# Patient Record
Sex: Male | Born: 1940 | Race: White | Hispanic: No | Marital: Married | Smoking: Former smoker
Health system: Southern US, Community
[De-identification: ages and names within clinical notes are randomized; demographics above are authoritative.]

## PROBLEM LIST (undated history)

## (undated) DIAGNOSIS — D649 Anemia, unspecified: Secondary | ICD-10-CM

## (undated) DIAGNOSIS — K219 Gastro-esophageal reflux disease without esophagitis: Secondary | ICD-10-CM

## (undated) DIAGNOSIS — H3530 Unspecified macular degeneration: Secondary | ICD-10-CM

## (undated) DIAGNOSIS — E785 Hyperlipidemia, unspecified: Secondary | ICD-10-CM

## (undated) DIAGNOSIS — K5090 Crohn's disease, unspecified, without complications: Secondary | ICD-10-CM

## (undated) DIAGNOSIS — I480 Paroxysmal atrial fibrillation: Secondary | ICD-10-CM

## (undated) DIAGNOSIS — K922 Gastrointestinal hemorrhage, unspecified: Secondary | ICD-10-CM

## (undated) DIAGNOSIS — C4492 Squamous cell carcinoma of skin, unspecified: Secondary | ICD-10-CM

## (undated) DIAGNOSIS — R011 Cardiac murmur, unspecified: Secondary | ICD-10-CM

## (undated) DIAGNOSIS — R002 Palpitations: Secondary | ICD-10-CM

## (undated) DIAGNOSIS — I491 Atrial premature depolarization: Secondary | ICD-10-CM

## (undated) DIAGNOSIS — I350 Nonrheumatic aortic (valve) stenosis: Secondary | ICD-10-CM

## (undated) HISTORY — DX: Atrial premature depolarization: I49.1

## (undated) HISTORY — PX: TONSILLECTOMY: SUR1361

## (undated) HISTORY — DX: Crohn's disease, unspecified, without complications: K50.90

## (undated) HISTORY — PX: SKIN CANCER EXCISION: SHX779

## (undated) HISTORY — DX: Hyperlipidemia, unspecified: E78.5

## (undated) HISTORY — PX: APPENDECTOMY: SHX54

## (undated) HISTORY — DX: Gastrointestinal hemorrhage, unspecified: K92.2

## (undated) HISTORY — DX: Paroxysmal atrial fibrillation: I48.0

## (undated) HISTORY — DX: Palpitations: R00.2

## (undated) HISTORY — DX: Nonrheumatic aortic (valve) stenosis: I35.0

## (undated) HISTORY — DX: Unspecified macular degeneration: H35.30

## (undated) HISTORY — PX: CATARACT EXTRACTION W/ INTRAOCULAR LENS  IMPLANT, BILATERAL: SHX1307

---
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
## 1962-11-10 DIAGNOSIS — K922 Gastrointestinal hemorrhage, unspecified: Secondary | ICD-10-CM

## 1962-11-10 HISTORY — PX: BLADDER REPAIR: SHX76

## 1962-11-10 HISTORY — DX: Gastrointestinal hemorrhage, unspecified: K92.2

## 2003-11-29 HISTORY — PX: CARDIOVASCULAR STRESS TEST: SHX262

## 2005-10-21 ENCOUNTER — Encounter: Admission: RE | Admit: 2005-10-21 | Discharge: 2005-10-21 | Payer: Self-pay | Admitting: Internal Medicine

## 2005-10-23 HISTORY — PX: US ECHOCARDIOGRAPHY: HXRAD669

## 2010-01-29 DIAGNOSIS — R079 Chest pain, unspecified: Secondary | ICD-10-CM | POA: Insufficient documentation

## 2010-01-29 DIAGNOSIS — M79672 Pain in left foot: Secondary | ICD-10-CM | POA: Insufficient documentation

## 2010-01-29 DIAGNOSIS — I480 Paroxysmal atrial fibrillation: Secondary | ICD-10-CM | POA: Insufficient documentation

## 2010-01-29 DIAGNOSIS — N3090 Cystitis, unspecified without hematuria: Secondary | ICD-10-CM | POA: Insufficient documentation

## 2010-01-29 DIAGNOSIS — E785 Hyperlipidemia, unspecified: Secondary | ICD-10-CM | POA: Insufficient documentation

## 2010-05-20 HISTORY — PX: US ECHOCARDIOGRAPHY: HXRAD669

## 2010-11-25 ENCOUNTER — Ambulatory Visit: Payer: Self-pay | Admitting: Cardiology

## 2011-05-15 ENCOUNTER — Telehealth: Payer: Self-pay | Admitting: Cardiology

## 2011-05-15 NOTE — Telephone Encounter (Signed)
Scheduled appointment for patient.

## 2011-05-15 NOTE — Telephone Encounter (Signed)
PATIENT NEEDS APPT

## 2011-06-13 ENCOUNTER — Encounter: Payer: Self-pay | Admitting: Cardiology

## 2011-06-18 ENCOUNTER — Encounter: Payer: Self-pay | Admitting: Cardiology

## 2011-06-18 ENCOUNTER — Ambulatory Visit (INDEPENDENT_AMBULATORY_CARE_PROVIDER_SITE_OTHER): Payer: Medicare Other | Admitting: Cardiology

## 2011-06-18 DIAGNOSIS — K5090 Crohn's disease, unspecified, without complications: Secondary | ICD-10-CM | POA: Insufficient documentation

## 2011-06-18 DIAGNOSIS — I350 Nonrheumatic aortic (valve) stenosis: Secondary | ICD-10-CM

## 2011-06-18 DIAGNOSIS — I359 Nonrheumatic aortic valve disorder, unspecified: Secondary | ICD-10-CM

## 2011-06-18 DIAGNOSIS — I480 Paroxysmal atrial fibrillation: Secondary | ICD-10-CM | POA: Insufficient documentation

## 2011-06-18 DIAGNOSIS — I4891 Unspecified atrial fibrillation: Secondary | ICD-10-CM

## 2011-06-18 NOTE — Progress Notes (Signed)
Nicholas [name] Date of Birth:  Sep 05, 1941 [facility] [address]   [address] [place], [place]  [address] [phone]           Fax   [phone]  HPI: Present 70 year old gentleman is seen for a six-month followup office visit he has a history of paroxysmal atrial fibrillation and history of mild aortic stenosis.  He had a normal stress Cardiolite in 2005.  His last echocardiogram was 05/20/10 and showed that his aortic stenosis is still mild with a peak gradient of 13 and a mean gradient of 6.7.  Since last visit the patient has been feeling well with no new cardiac symptoms.  Current Outpatient Prescriptions  Medication Sig Dispense Refill  . aspirin 81 MG EC tablet Take 81 mg by mouth daily.        . digoxin (LANOXIN) 0.25 MG tablet Take 250 mcg by mouth daily.        . fenofibrate micronized (LOFIBRA) 200 MG capsule Take 200 mg by mouth daily before breakfast.        . ferrous sulfate 325 (65 FE) MG tablet Take 1,300 mg by mouth daily with breakfast.        . metoprolol tartrate (LOPRESSOR) 25 MG tablet Take 25 mg by mouth 2 (two) times daily.        . Multiple Vitamins-Minerals (ICAPS PO) Take by mouth daily.          No Known Allergies  There is no problem list on file for this patient.   History  Smoking status  . Former Smoker  . Quit date: 11/11/1963  Smokeless tobacco  . Not on file    History  Alcohol Use No    No family history on file.  Review of Systems: The patient denies any heat or cold intolerance.  No weight gain or weight loss.  The patient denies headaches or blurry vision.  There is no cough or sputum production.  The patient denies dizziness.  There is no hematuria or hematochezia.  The patient denies any muscle aches or arthritis.  The patient denies any rash.  The patient denies frequent falling or instability.  There is no history of depression or anxiety.  All other systems were reviewed and are  negative.   Physical Exam: Filed Vitals:   06/18/11 1119  BP: 120/60  Pulse: 64  The general appearance reveals a well-developed well-nourished gentleman in no distress.Pupils equal and reactive.   Extraocular Movements are full.  There is no scleral icterus.  The mouth and pharynx are normal.  The neck is supple.  The carotids reveal no bruits.  The jugular venous pressure is normal.  The thyroid is not enlarged.  There is no lymphadenopathy.  The chest is clear to percussion and auscultation. There are no rales or rhonchi. Expansion of the chest is symmetrical.  The precordium is quiet.  The first heart sound is normal.  The second heart sound is physiologically split.  There is no  gallop rub or click.  There is no abnormal lift or heave.There is a soft systolic ejection murmur at the base.The abdomen is soft and nontender. Bowel sounds are normal. The liver and spleen are not enlarged. There Are no abdominal masses. There are no bruits.    Normal extremity without phlebitis or edema.Strength is normal and symmetrical in all extremities.  There is no lateralizing weakness.  There are no sensory deficits.  The skin is warm and dry.  There is no rash.He does have very dry skin on his lower extremities        Assessment / Plan: Continue same medication recheck in 6 months for followup office visit and EKG

## 2011-07-12 ENCOUNTER — Ambulatory Visit (INDEPENDENT_AMBULATORY_CARE_PROVIDER_SITE_OTHER): Payer: Medicare Other

## 2011-07-12 ENCOUNTER — Inpatient Hospital Stay (INDEPENDENT_AMBULATORY_CARE_PROVIDER_SITE_OTHER)
Admission: RE | Admit: 2011-07-12 | Discharge: 2011-07-12 | Disposition: A | Discharge status: Home / Self Care | Payer: Medicare Other | Source: Ambulatory Visit | Attending: Emergency Medicine | Admitting: Emergency Medicine

## 2011-07-12 DIAGNOSIS — S5000XA Contusion of unspecified elbow, initial encounter: Secondary | ICD-10-CM

## 2011-08-22 DIAGNOSIS — E039 Hypothyroidism, unspecified: Secondary | ICD-10-CM | POA: Insufficient documentation

## 2011-12-03 DIAGNOSIS — K921 Melena: Secondary | ICD-10-CM | POA: Insufficient documentation

## 2011-12-04 ENCOUNTER — Encounter: Payer: Self-pay | Admitting: Gastroenterology

## 2011-12-15 ENCOUNTER — Ambulatory Visit (INDEPENDENT_AMBULATORY_CARE_PROVIDER_SITE_OTHER): Payer: Medicare Other | Admitting: Cardiology

## 2011-12-15 ENCOUNTER — Encounter: Payer: Self-pay | Admitting: Cardiology

## 2011-12-15 VITALS — BP 110/68 | HR 45 | Wt 152.0 lb

## 2011-12-15 DIAGNOSIS — I359 Nonrheumatic aortic valve disorder, unspecified: Secondary | ICD-10-CM

## 2011-12-15 DIAGNOSIS — I350 Nonrheumatic aortic (valve) stenosis: Secondary | ICD-10-CM

## 2011-12-15 DIAGNOSIS — I480 Paroxysmal atrial fibrillation: Secondary | ICD-10-CM

## 2011-12-15 DIAGNOSIS — K5090 Crohn's disease, unspecified, without complications: Secondary | ICD-10-CM

## 2011-12-15 DIAGNOSIS — I4891 Unspecified atrial fibrillation: Secondary | ICD-10-CM

## 2011-12-15 MED ORDER — DIGOXIN 250 MCG PO TABS: 250.0000 ug | ORAL_TABLET | Freq: Every day | ORAL | Status: DC

## 2011-12-15 NOTE — Patient Instructions (Signed)
Your physician recommends that you continue on your current medications as directed. Please refer to the Current Medication list given to you today.  Your physician wants you to follow-up in: 6 months. You will receive a reminder letter in the mail two months in advance. If you don't receive a letter, please call our office to schedule the follow-up appointment.  

## 2011-12-15 NOTE — Assessment & Plan Note (Signed)
The patient has not had any episodes of recurrent atrial fibrillation.  Is much cardiogram today shows sinus bradycardia and no ectopic beats.

## 2011-12-15 NOTE — Progress Notes (Signed)
Nicholas [name] Date of Birth:  Feb 16, 1941 [facility] [address] [place], [place]  [address] [phone]         Fax   [phone]  History of Present Illness: This pleasant 71 year old gentleman is seen for a six-month followup office visit.  He has a past history of paroxysmal atrial fibrillation.  He is a past history of mild aortic stenosis.  He has a history of Crohn's disease since he was a teenager.  Since last visit the patient has been done well from the standpoint of his heart.  He has had a lot of difficulty with his abdomen.  Current Outpatient Prescriptions  Medication Sig Dispense Refill  . digoxin (LANOXIN) 0.25 MG tablet Take 1 tablet (250 mcg total) by mouth daily.  90 tablet  3  . fenofibrate micronized (LOFIBRA) 200 MG capsule Take 200 mg by mouth daily before breakfast.        . ferrous sulfate 325 (65 FE) MG tablet 2 TABS PO QD      . metoprolol (LOPRESSOR) 50 MG tablet 1/2 TAB PO BID      . multivitamin-lutein (OCUVITE-LUTEIN) CAPS Take 1 capsule by mouth daily.      [name] aspirin 81 MG EC tablet Take 81 mg by mouth daily.          No Known Allergies  Patient Active Problem List  Diagnoses  . Aortic stenosis, mild  . Paroxysmal atrial fibrillation  . Crohn's disease    History  Smoking status  . Former Smoker  . Quit date: 11/11/1963  Smokeless tobacco  . Not on file    History  Alcohol Use No    No family history on file.  Review of Systems: Constitutional: no fever chills diaphoresis or fatigue or change in weight.  Head and neck: no hearing loss, no epistaxis, no photophobia or visual disturbance. Respiratory: No cough, shortness of breath or wheezing. Cardiovascular: No chest pain peripheral edema, palpitations. Gastrointestinal: Positive for Crohn's disease Genitourinary: No dysuria, no frequency, no urgency, no nocturia. Musculoskeletal:No arthralgias, no back pain, no gait disturbance or myalgias. Neurological:  No dizziness, no headaches, no numbness, no seizures, no syncope, no weakness, no tremors. Hematologic: No lymphadenopathy, no easy bruising. Psychiatric: No confusion, no hallucinations, no sleep disturbance.    Physical Exam: Filed Vitals:   12/15/11 1025  BP: 110/68  Pulse: 45   the general appearance reveals a slightly pale middle-aged gentleman in no acute distress.Pupils equal and reactive.   Extraocular Movements are full.  There is no scleral icterus.  The mouth and pharynx are normal.  The neck is supple.  The carotids reveal no bruits.  The jugular venous pressure is normal.  The thyroid is not enlarged.  There is no lymphadenopathy.  The chest is clear to percussion and auscultation. There are no rales or rhonchi. Expansion of the chest is symmetrical.  Heart reveals a soft systolic ejection murmur at the base.  No diastolic murmur.  No gallop or rub.The abdomen is soft and nontender. Bowel sounds are normal. The liver and spleen are not enlarged. There Are no abdominal masses. There are no bruits.  The pedal pulses are good.  There is no phlebitis or edema.  There is no cyanosis or clubbing. Strength is normal and symmetrical in all extremities.  There is no lateralizing weakness.  There are no sensory deficits.  EKG shows sinus bradycardia and no ischemic changes   Assessment / Plan: Continue on same medication.  Recheck in 6 months.

## 2011-12-15 NOTE — Assessment & Plan Note (Signed)
Since his last visit here the patient has had difficulty with complications from his Crohn's disease.  He started feeling bad about Thanksgiving 2012.  In January he started running a fever and went to the urgent care where he was given antibiotics which helped.  His hemoglobin at that time which lasted for 24 hours and to followup a hemoglobin dropped to 10.6.  The patient has an appointment to see Dr. Russella Dar next week.  He states that because of previous extensive scarring from his Crohn's disease he is unable to have a colonoscopy.  He has had Crohn's disease for more than 40 years.  He had bright hematochezia for 24 hour hours and for that reason held his aspirin for 10 days.

## 2011-12-15 NOTE — Assessment & Plan Note (Signed)
The patient denies any chest pain or shortness of breath or exertional dizziness or syncope related to his aortic valve disease.

## 2011-12-18 ENCOUNTER — Encounter: Payer: Self-pay | Admitting: Gastroenterology

## 2011-12-18 ENCOUNTER — Ambulatory Visit (INDEPENDENT_AMBULATORY_CARE_PROVIDER_SITE_OTHER): Payer: Medicare Other | Admitting: Gastroenterology

## 2011-12-18 VITALS — BP 120/60 | HR 58 | Ht 69.0 in | Wt 153.2 lb

## 2011-12-18 DIAGNOSIS — D649 Anemia, unspecified: Secondary | ICD-10-CM

## 2011-12-18 DIAGNOSIS — K921 Melena: Secondary | ICD-10-CM

## 2011-12-18 DIAGNOSIS — K5090 Crohn's disease, unspecified, without complications: Secondary | ICD-10-CM

## 2011-12-18 NOTE — Progress Notes (Signed)
History of Present Illness: This is a 71 year old male here today with his wife. He relates a history of bloody bowel movements occurring 3-4 occasions over the course of 24 hours on January 22. He was evaluated by Dr. Eloise Harman who found no abnormalities on rectal examination. His hemoglobin was 10.4 on 1/22 and it has increased to 12.8 on 2/1. He has a very complicated past history and unfortunately I do not have any records available. 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 He was then diagnosed with Crohn's disease. He had a hospitalization for major GI hemorrhage in the early 1960s that he feels was related to his Crohn's disease. Has not been on chronic medication or had any GI followup for years. He saw Dr. Eugene Garnet in the 1980s and apparently he had 2 attempts at colonoscopies but his bowel was not prepped and it appears he may have had substantial rearrangement of his colonic anatomy. He states he hasn't lost his appetite for about one month which has now returned and he lost 10 pounds. He generally has 3 loose stools per day and has had this bowel pattern for many years. Denies abdominal pain, constipation, change in stool caliber, melena, nausea, vomiting, dysphagia, reflux symptoms, chest pain.   No Known Allergies Outpatient Prescriptions Prior to Visit  Medication Sig Dispense Refill  . aspirin 81 MG EC tablet Take 81 mg by mouth daily.        . digoxin (LANOXIN) 0.25 MG tablet Take 1 tablet (250 mcg total) by mouth daily.  90 tablet  3  . fenofibrate micronized (LOFIBRA) 200 MG capsule Take 200 mg by mouth daily before breakfast.        . ferrous sulfate 325 (65 FE) MG tablet 2 TABS PO QD      . metoprolol (LOPRESSOR) 50 MG tablet 1/2 TAB PO BID      . multivitamin-lutein  (OCUVITE-LUTEIN) CAPS Take 1 capsule by mouth daily.       Past Medical History  Diagnosis Date  . Hyperlipidemia   . PAF (paroxysmal atrial fibrillation)   . Aortic stenosis, mild   . Crohn's disease   . PAC (premature atrial contraction)   . Palpitations   . Macular degeneration   . Cataract   . Colonic hemorrhage 1964   Past Surgical History  Procedure Date  . Tonsillectomy   . Stomach surgery 1959    ulcer  . US echocardiography 05/20/2010    EF 55-60%  . US echocardiography 10/23/2005    EF 55-60%  . Cardiovascular stress test 11/29/2003    EF 64%  . Appendectomy   . Bladder repair 1964    intestine leaks/bladder repair   History   Social History  . Marital Status: Married    Spouse Name: N/A    Number of Children: 3  . Years of Education: N/A   Occupational History  . Retired    Social History Main Topics  . Smoking status: Former Smoker    Quit date: 11/11/1963  . Smokeless tobacco: Never Used  . Alcohol Use: No  . Drug Use: No  . Sexually Active: None   Other Topics Concern  . None   Social History Narrative   caffeinated drinks less than one a day  Family History  Problem Relation Age of Onset  . Heart disease Maternal Grandfather   . Heart disease      maternal side  . Colon cancer Neg Hx     Review of Systems: Pertinent positive and negative review of systems were noted in the above HPI section. All other review of systems were otherwise negative.  Physical Exam: General: Well developed , well nourished, no acute distress Head: Normocephalic and atraumatic Eyes:  sclerae anicteric, EOMI Ears: Normal auditory acuity Mouth: No deformity or lesions Neck: Supple, no masses or thyromegaly Lungs: Clear throughout to auscultation Heart: Regular rate and rhythm; no murmurs, rubs or bruits Abdomen: Well-healed upper abdominal incision just off the midline and a well-healed lower abdominal midline incision. His left abdomen has a doughy  texture. Soft, non tender and non distended. No masses, hepatosplenomegaly or hernias noted. Normal Bowel sounds Rectal: Deferred with recent exam by Dr. Eloise Harman showing no lesions and no blood Musculoskeletal: Symmetrical with no gross deformities  Skin: No lesions on visible extremities Pulses:  Normal pulses noted Extremities: No clubbing, cyanosis, edema or deformities noted Neurological: Alert oriented x 4, grossly nonfocal Cervical Nodes:  No significant cervical adenopathy Inguinal Nodes: No significant inguinal adenopathy Psychological:  Alert and cooperative. Normal mood and affect  Assessment and Recommendations:  1. Self-limited hematochezia. Possible diverticular bleed or bleed secondary to Crohn's disease. Rule out colorectal neoplasms, AVMs and other possibilities. Given the 2 prior failed attempts at colonoscopy I would like to try to obtain records from Dr. Reece Packer before considering another attempt at colonoscopy. Schedule CT scan of the abdomen and pelvis.  2. History of Crohn's disease with a prior colovesical fistula surgical management. Given the patient's description he may have a nonfunctional or bypass segment of colon. CT scan as above.  3. 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

## 2011-12-18 NOTE — Patient Instructions (Signed)
  You have been scheduled for a CT scan of the abdomen and pelvis at [facility] ([address]---this is in the same building as [facility]).   You are scheduled on 12/22/11 at 10:30am. You should arrive 15 minutes prior to your appointment time for registration. Please follow the written instructions below on the day of your exam:  WARNING: IF YOU ARE ALLERGIC TO IODINE/X-RAY DYE, PLEASE NOTIFY RADIOLOGY IMMEDIATELY AT [phone]! YOU WILL BE GIVEN A 13 HOUR PREMEDICATION PREP.  1) Do not eat or drink anything after 6:30am (4 hours prior to your test) 2) You have been given 2 bottles of oral contrast to drink. The solution may taste better if refrigerated, but do NOT add ice or any other liquid to this solution. Shake well before drinking.    Drink 1 bottle of contrast @ 8:30am (2 hours prior to your exam)  Drink 1 bottle of contrast @ 9:30am (1 hour prior to your exam)  You may take any medications as prescribed with a small amount of water except for the following: Metformin, Glucophage, Glucovance, Avandamet, Riomet, Fortamet, Actoplus Met, Janumet, Glumetza or Metaglip. The above medications must be held the day of the exam AND 48 hours after the exam.  The purpose of you drinking the oral contrast is to aid in the visualization of your intestinal tract. The contrast solution may cause some diarrhea. Before your exam is started, you will be given a small amount of fluid to drink. Depending on your individual set of symptoms, you may also receive an intravenous injection of x-ray contrast/dye. Plan on being at [facility] for 30 minutes or long, depending on the type of exam you are having performed.  If you have any questions regarding your exam or if you need to reschedule, you may call the CT department at [phone] between the hours of 8:00 am and 5:00 pm, Monday-Friday.  ________________________________________________________________________  We will  attempt to obtain all of your GI records from Dr. Barnett Abu. cc: Jarome Matin, MD

## 2011-12-22 ENCOUNTER — Ambulatory Visit (INDEPENDENT_AMBULATORY_CARE_PROVIDER_SITE_OTHER)
Admission: RE | Admit: 2011-12-22 | Discharge: 2011-12-22 | Disposition: A | Discharge status: Home / Self Care | Payer: Medicare Other | Source: Ambulatory Visit | Attending: Gastroenterology | Admitting: Gastroenterology

## 2011-12-22 ENCOUNTER — Other Ambulatory Visit: Payer: Self-pay

## 2011-12-22 DIAGNOSIS — K921 Melena: Secondary | ICD-10-CM

## 2011-12-22 DIAGNOSIS — D649 Anemia, unspecified: Secondary | ICD-10-CM

## 2011-12-22 MED ORDER — BUDESONIDE 3 MG PO CP24: 9.0000 mg | ORAL_CAPSULE | Freq: Every day | ORAL | Status: DC

## 2011-12-22 MED ORDER — IOHEXOL 300 MG/ML  SOLN
100.0000 mL | Freq: Once | INTRAMUSCULAR | Status: AC | PRN
  Administered 2011-12-22: 100 mL via INTRAVENOUS

## 2011-12-25 ENCOUNTER — Telehealth: Payer: Self-pay | Admitting: Gastroenterology

## 2011-12-25 MED ORDER — BUDESONIDE 3 MG PO CP24: 9.0000 mg | ORAL_CAPSULE | Freq: Every day | ORAL | Status: DC

## 2011-12-25 NOTE — Telephone Encounter (Signed)
Resent the budesonide the [facility] pharmacy and notified the patient. Spelled the medication name to the patient per his request.

## 2012-01-06 ENCOUNTER — Telehealth: Payer: Self-pay | Admitting: Gastroenterology

## 2012-01-06 NOTE — Telephone Encounter (Signed)
9 pages received.

## 2012-01-13 DIAGNOSIS — K50813 Crohn's disease of both small and large intestine with fistula: Secondary | ICD-10-CM | POA: Insufficient documentation

## 2012-01-30 ENCOUNTER — Telehealth: Payer: Self-pay | Admitting: Gastroenterology

## 2012-01-30 NOTE — Telephone Encounter (Signed)
No other advice on meds. He should call Dr. Steele Sizer to check on his advice regarding budesonide.

## 2012-01-30 NOTE — Telephone Encounter (Signed)
Notified patient of Dr. Ardell Isaacs recommendations to contact Dr. Murrell Redden office. Pt agreed and verbalized understanding.

## 2012-01-30 NOTE — Telephone Encounter (Signed)
Patient states he will be in the donut hole in mid April and cannot afford to pay out of pocket for budesonide. Patient states that when he went and saw Dr. Chesley Mires at [facility], he told him that he may be able to decrease his budesonide sometime soon. I told patient that I have read the note from Dr. Chesley Mires and it does not mention changing his medication at this time but to monitor him and schedule f/u visit in 4 months. Pt states that Dr. Chesley Mires mentioned it to him but did not say for sure that he could decrease his medicine now. Told him that I know that he may not be able to afford the budesonide but the only way to get out of a donut hole unfortunately is to pay your way out. Pt states he will not be able to financially do that when the times comes. Told him I can get samples for him to take at that time but not enough to sustain him long term. Pt states he will take the samples but wants to know if there is a alternative medicine or if he can decrease medicine. I told him that all the other alternative medicines will be just as expensive when he is in the donut hole but I will ask Dr. Russella Dar about decreasing budesonide. Samples left out front for patient to pick up at his convenience. Please advise.

## 2012-04-09 ENCOUNTER — Emergency Department (HOSPITAL_COMMUNITY): Payer: Medicare Other

## 2012-04-09 ENCOUNTER — Encounter: Payer: Self-pay | Admitting: Gastroenterology

## 2012-04-09 ENCOUNTER — Encounter (HOSPITAL_COMMUNITY): Payer: Self-pay | Admitting: Emergency Medicine

## 2012-04-09 ENCOUNTER — Inpatient Hospital Stay (HOSPITAL_COMMUNITY)
Admission: EM | Admit: 2012-04-09 | Discharge: 2012-04-26 | DRG: 330 | Disposition: A | Discharge status: Home / Self Care | Payer: Medicare Other | Attending: Surgery | Admitting: Surgery

## 2012-04-09 DIAGNOSIS — K632 Fistula of intestine: Secondary | ICD-10-CM | POA: Diagnosis present

## 2012-04-09 DIAGNOSIS — K56609 Unspecified intestinal obstruction, unspecified as to partial versus complete obstruction: Secondary | ICD-10-CM | POA: Diagnosis present

## 2012-04-09 DIAGNOSIS — D62 Acute posthemorrhagic anemia: Secondary | ICD-10-CM | POA: Diagnosis present

## 2012-04-09 DIAGNOSIS — R109 Unspecified abdominal pain: Secondary | ICD-10-CM

## 2012-04-09 DIAGNOSIS — L02219 Cutaneous abscess of trunk, unspecified: Secondary | ICD-10-CM | POA: Diagnosis present

## 2012-04-09 DIAGNOSIS — L03319 Cellulitis of trunk, unspecified: Secondary | ICD-10-CM | POA: Diagnosis present

## 2012-04-09 DIAGNOSIS — R933 Abnormal findings on diagnostic imaging of other parts of digestive tract: Secondary | ICD-10-CM

## 2012-04-09 DIAGNOSIS — Z87891 Personal history of nicotine dependence: Secondary | ICD-10-CM

## 2012-04-09 DIAGNOSIS — K5090 Crohn's disease, unspecified, without complications: Principal | ICD-10-CM | POA: Diagnosis present

## 2012-04-09 DIAGNOSIS — H3530 Unspecified macular degeneration: Secondary | ICD-10-CM | POA: Diagnosis present

## 2012-04-09 DIAGNOSIS — E46 Unspecified protein-calorie malnutrition: Secondary | ICD-10-CM | POA: Diagnosis present

## 2012-04-09 DIAGNOSIS — R1031 Right lower quadrant pain: Secondary | ICD-10-CM

## 2012-04-09 DIAGNOSIS — I4891 Unspecified atrial fibrillation: Secondary | ICD-10-CM | POA: Diagnosis present

## 2012-04-09 DIAGNOSIS — E785 Hyperlipidemia, unspecified: Secondary | ICD-10-CM | POA: Diagnosis present

## 2012-04-09 LAB — CBC
HCT: 34.5 % — ABNORMAL LOW (ref 39.0–52.0)
MCHC: 33 g/dL (ref 30.0–36.0)
Platelets: 303 10*3/uL (ref 150–400)
RDW: 16.2 % — ABNORMAL HIGH (ref 11.5–15.5)
WBC: 10.4 10*3/uL (ref 4.0–10.5)

## 2012-04-09 LAB — COMPREHENSIVE METABOLIC PANEL
ALT: 7 U/L (ref 0–53)
Albumin: 3 g/dL — ABNORMAL LOW (ref 3.5–5.2)
Alkaline Phosphatase: 49 U/L (ref 39–117)
Calcium: 9 mg/dL (ref 8.4–10.5)
GFR calc Af Amer: >90 mL/min (ref >90)
Glucose, Bld: 128 mg/dL — ABNORMAL HIGH (ref 70–99)
Potassium: 3.8 mEq/L (ref 3.5–5.1)
Sodium: 134 mEq/L — ABNORMAL LOW (ref 135–145)
Total Protein: 6.6 g/dL (ref 6.0–8.3)

## 2012-04-09 LAB — DIFFERENTIAL
Basophils Absolute: 0 10*3/uL (ref 0.0–0.1)
Basophils Relative: 0 % (ref 0–1)
Lymphocytes Relative: 9 % — ABNORMAL LOW (ref 12–46)
Monocytes Absolute: 0.8 10*3/uL (ref 0.1–1.0)
Neutro Abs: 8.6 10*3/uL — ABNORMAL HIGH (ref 1.7–7.7)
Neutrophils Relative %: 83 % — ABNORMAL HIGH (ref 43–77)

## 2012-04-09 LAB — SEDIMENTATION RATE: Sed Rate: 36 mm/hr — ABNORMAL HIGH (ref 0–16)

## 2012-04-09 MED ORDER — KCL IN DEXTROSE-NACL 10-5-0.45 MEQ/L-%-% IV SOLN
INTRAVENOUS | Status: DC
  Administered 2012-04-10 (×3): via INTRAVENOUS
  Filled 2012-04-09 (×5): qty 1000

## 2012-04-09 MED ORDER — ONDANSETRON HCL 4 MG/2ML IJ SOLN: 4.0000 mg | Freq: Four times a day (QID) | INTRAMUSCULAR | Status: DC | PRN

## 2012-04-09 MED ORDER — HYDROMORPHONE HCL PF 1 MG/ML IJ SOLN: 1.0000 mg | INTRAMUSCULAR | Status: DC | PRN

## 2012-04-09 MED ORDER — PANTOPRAZOLE SODIUM 40 MG IV SOLR
40.0000 mg | Freq: Every day | INTRAVENOUS | Status: DC
  Administered 2012-04-10 – 2012-04-25 (×16): 40 mg via INTRAVENOUS
  Filled 2012-04-09 (×19): qty 40

## 2012-04-09 MED ORDER — ONDANSETRON HCL 4 MG/2ML IJ SOLN
4.0000 mg | Freq: Once | INTRAMUSCULAR | Status: AC
  Administered 2012-04-09: 4 mg via INTRAVENOUS
  Filled 2012-04-09: qty 2

## 2012-04-09 MED ORDER — HYDROMORPHONE HCL PF 1 MG/ML IJ SOLN
0.5000 mg | INTRAMUSCULAR | Status: DC | PRN
  Administered 2012-04-09: 0.5 mg via INTRAVENOUS
  Filled 2012-04-09: qty 1

## 2012-04-09 MED ORDER — DIGOXIN 0.25 MG/ML IJ SOLN
0.1250 mg | Freq: Every day | INTRAMUSCULAR | Status: DC
  Administered 2012-04-11 – 2012-04-26 (×15): 0.125 mg via INTRAVENOUS
  Filled 2012-04-09 (×18): qty 0.5

## 2012-04-09 MED ORDER — IOHEXOL 300 MG/ML  SOLN
20.0000 mL | INTRAMUSCULAR | Status: AC
  Administered 2012-04-09 (×2): 20 mL via ORAL

## 2012-04-09 MED ORDER — PIPERACILLIN-TAZOBACTAM 3.375 G IVPB
3.3750 g | Freq: Three times a day (TID) | INTRAVENOUS | Status: DC
  Administered 2012-04-10 – 2012-04-24 (×42): 3.375 g via INTRAVENOUS
  Filled 2012-04-09 (×52): qty 50

## 2012-04-09 MED ORDER — ENOXAPARIN SODIUM 40 MG/0.4ML ~~LOC~~ SOLN
40.0000 mg | SUBCUTANEOUS | Status: DC
  Administered 2012-04-10 – 2012-04-15 (×6): 40 mg via SUBCUTANEOUS
  Filled 2012-04-09 (×7): qty 0.4

## 2012-04-09 MED ORDER — METOPROLOL TARTRATE 1 MG/ML IV SOLN
10.0000 mg | Freq: Four times a day (QID) | INTRAVENOUS | Status: DC
  Administered 2012-04-12: 10 mg via INTRAVENOUS
  Filled 2012-04-09 (×18): qty 10

## 2012-04-09 MED ORDER — HYDROMORPHONE HCL PF 1 MG/ML IJ SOLN
0.5000 mg | Freq: Once | INTRAMUSCULAR | Status: AC
  Administered 2012-04-09: 0.5 mg via INTRAVENOUS
  Filled 2012-04-09: qty 1

## 2012-04-09 MED ORDER — PIPERACILLIN-TAZOBACTAM 3.375 G IVPB 30 MIN
3.3750 g | Freq: Once | INTRAVENOUS | Status: AC
  Administered 2012-04-09: 3.375 g via INTRAVENOUS
  Filled 2012-04-09: qty 50

## 2012-04-09 MED ORDER — IOHEXOL 300 MG/ML  SOLN
100.0000 mL | Freq: Once | INTRAMUSCULAR | Status: AC | PRN
  Administered 2012-04-09: 100 mL via INTRAVENOUS

## 2012-04-09 MED ORDER — SODIUM CHLORIDE 0.9 % IV BOLUS (SEPSIS)
1000.0000 mL | Freq: Once | INTRAVENOUS | Status: AC
  Administered 2012-04-09: 1000 mL via INTRAVENOUS

## 2012-04-09 NOTE — H&P (Signed)
Nicholas [name] is an 71 y.o. male.   Chief Complaint: Abdominal pain history of Crohn's disease HPI: Pt has a 5 month history of intermittent abdominal pain.  Location is RLQ.  Comes and goes.  Persistent and more severe today.  No nausea or vomiting.  No BM today.  History of blood in his stool.  Is followed at [facility] for his Crohn's.  Pain now constant.  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 He has had minimal medical treatment of his Crohns and no problem until November 2012 when he had hematochezia and was seen by [place].  Past Medical History  Diagnosis Date  . Hyperlipidemia   . PAF (paroxysmal atrial fibrillation)   . Aortic stenosis, mild   . Crohn's disease   . PAC (premature atrial contraction)   . Palpitations   . Macular degeneration   . Cataract   . Colonic hemorrhage 1964    Past Surgical History  Procedure Date  . Tonsillectomy   . Stomach surgery 1959    ulcer  . US echocardiography 05/20/2010    EF 55-60%  . US echocardiography 10/23/2005    EF 55-60%  . Cardiovascular stress test 11/29/2003    EF 64%  . Appendectomy   . Bladder repair 1964    intestine leaks/bladder repair    Family History  Problem Relation Age of Onset  . Heart disease Maternal Grandfather   . Heart disease      maternal side  . Colon cancer Neg Hx    Social History:  reports that he quit smoking about 48 years ago. He has never used smokeless tobacco. He reports that he does not drink alcohol or use illicit drugs.  Allergies: No Known Allergies   (Not in a hospital admission)  Results for orders placed during the hospital encounter of 04/09/12 (from the past 48 hour(s))  CBC     Status: Abnormal   Collection Time   04/09/12  5:58 PM      Component Value Range Comment   WBC 10.4  4.0 - 10.5 (K/uL)    RBC 4.34  4.22 - 5.81  (MIL/uL)    Hemoglobin 11.4 (*) 13.0 - 17.0 (g/dL)    HCT 40.3 (*) 47.4 - 52.0 (%)    MCV 79.5  78.0 - 100.0 (fL)    MCH 26.3  26.0 - 34.0 (pg)    MCHC 33.0  30.0 - 36.0 (g/dL)    RDW 25.9 (*) 56.3 - 15.5 (%)    Platelets 303  150 - 400 (K/uL)   DIFFERENTIAL     Status: Abnormal   Collection Time   04/09/12  5:58 PM      Component Value Range Comment   Neutrophils Relative 83 (*) 43 - 77 (%)    Neutro Abs 8.6 (*) 1.7 - 7.7 (K/uL)    Lymphocytes Relative 9 (*) 12 - 46 (%)    Lymphs Abs 0.9  0.7 - 4.0 (K/uL)    Monocytes Relative 8  3 - 12 (%)    Monocytes Absolute 0.8  0.1 - 1.0 (K/uL)    Eosinophils Relative 1  0 - 5 (%)    Eosinophils Absolute 0.1  0.0 - 0.7 (K/uL)    Basophils Relative 0  0 - 1 (%)    Basophils Absolute 0.0  0.0 -  0.1 (K/uL)   SEDIMENTATION RATE     Status: Abnormal   Collection Time   04/09/12  5:58 PM      Component Value Range Comment   Sed Rate 36 (*) 0 - 16 (mm/hr)   COMPREHENSIVE METABOLIC PANEL     Status: Abnormal   Collection Time   04/09/12  5:58 PM      Component Value Range Comment   Sodium 134 (*) 135 - 145 (mEq/L)    Potassium 3.8  3.5 - 5.1 (mEq/L)    Chloride 101  96 - 112 (mEq/L)    CO2 21  19 - 32 (mEq/L)    Glucose, Bld 128 (*) 70 - 99 (mg/dL)    BUN 10  6 - 23 (mg/dL)    Creatinine, Ser 1.61  0.50 - 1.35 (mg/dL)    Calcium 9.0  8.4 - 10.5 (mg/dL)    Total Protein 6.6  6.0 - 8.3 (g/dL)    Albumin 3.0 (*) 3.5 - 5.2 (g/dL)    AST 13  0 - 37 (U/L)    ALT 7  0 - 53 (U/L)    Alkaline Phosphatase 49  39 - 117 (U/L)    Total Bilirubin 0.6  0.3 - 1.2 (mg/dL)    GFR calc non Af Amer 84 (*) >90 (mL/min)    GFR calc Af Amer >90  >90 (mL/min)   LIPASE, BLOOD     Status: Normal   Collection Time   04/09/12  5:58 PM      Component Value Range Comment   Lipase 27  11 - 59 (U/L)    Ct Abdomen Pelvis W Contrast  04/09/2012  *RADIOLOGY REPORT*  Clinical Data: Right lower quadrant abdominal pain.  History of Crohn's disease.  Previous ulcer repair  and bowel perforation surgery.  CT ABDOMEN AND PELVIS WITH CONTRAST  Technique:  Multidetector CT imaging of the abdomen and pelvis was performed following the standard protocol during bolus administration of intravenous contrast.  Contrast: OMNIPAQUE IOHEXOL 300 MG/ML  SOLN 100 ml Omnipaque- 300  Comparison: 12/22/2011.  Findings: Large amount of stool in the colon with distention of the colon by stool and gas.  Multiple distal small bowel loops with diffuse wall thickening and surrounding soft tissue stranding.  The loops in this region appear matted together.  These findings have progressed since the previous examination.  Again demonstrated is a possible fistula connecting the distal ileum and sigmoid colon.  There is a possible small amount of extraluminal air anteriorly, within an area of fluid density.  This area of fluid density containing gas or air has a thin surrounding wall with enhancement.  This collection measures 2.4 x 1.5 cm in maximum dimensions on sagittal image number 72 and 2.3 cm in transverse dimension on axial image number 68.  Small bilateral renal cysts are again demonstrated.  Unremarkable liver, a bilobed left liver cyst is also again is demonstrated, measuring 1.7 cm in maximum diameter on image number 15.  Unremarkable spleen, pancreas, gallbladder, adrenal glands and urinary bladder.  Mildly enlarged prostate gland. No significant change in mildly enlarged lymph nodes in the right lower quadrant abdominal mesentery.  The largest has a short axis diameter of 1.2 cm on image number 51.  Mildly prominent portacaval and retroperitoneal lymph nodes are unchanged.  Clear lung bases.  Mild lumbar and lower thoracic spine degenerative changes.  IMPRESSION:  1.  Progressive changes of active Crohn's disease involving multiple distal loops of ileum in  the right pelvis, anteriorly. There is evidence of a small contained perforation anteriorly in the region with a possible early abscess  forming, measuring 2.4 x 2.3 x 1.5 cm. 2.  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 3.  Stable mild right lower quadrant mesenteric adenopathy, most likely reactive. 4.  Prominent stool.  Original Report Authenticated By: Darrol Angel, M.D.    Review of Systems  Constitutional: Positive for weight loss and malaise/fatigue. Negative for fever and chills.  HENT: Negative.   Eyes: Negative.   Respiratory: Negative.   Cardiovascular: Negative.   Gastrointestinal: Positive for abdominal pain and blood in stool.  Genitourinary: Negative.   Musculoskeletal: Negative.   Skin: Negative.   Neurological: Positive for weakness.  Endo/Heme/Allergies: Negative.   Psychiatric/Behavioral: Negative.     Blood pressure 130/74, pulse 71, temperature 98.9 F (37.2 C), temperature source Oral, resp. rate 18, SpO2 96.00%. Physical Exam  Constitutional: He is oriented to person, place, and time. He appears well-developed and well-nourished.  HENT:  Head: Normocephalic and atraumatic.  Eyes: EOM are normal. Pupils are equal, round, and reactive to light.  Neck: Normal range of motion. Neck supple.  Cardiovascular: Normal rate and regular rhythm.   Respiratory: Effort normal and breath sounds normal.  GI: Soft. He exhibits mass. He exhibits no distension. There is tenderness. There is no rebound and no guarding.    Musculoskeletal: Normal range of motion.  Neurological: He is alert and oriented to person, place, and time.  Skin: Skin is warm and dry.  Psychiatric: He has a normal mood and affect. His behavior is normal. Judgment and thought content normal.     Assessment/Plan Crohn's stricture/  Abscess RLQ/  PHLEGMON No diffuse peritonitis IVF ABX GI consult Will probably need exploratory laparotomy at some point given CT finding and chronicity of problem.  Nicholas [name] A. 04/09/2012, 9:59 PM

## 2012-04-09 NOTE — ED Notes (Signed)
Started around [place]. States he has a few good days. Wednesday felt great down hill from there. Pain at worse 10/10 with movement. Denies pain at this time

## 2012-04-09 NOTE — Telephone Encounter (Signed)
Error

## 2012-04-09 NOTE — ED Notes (Signed)
Patient is resting comfortably. 

## 2012-04-09 NOTE — ED Notes (Signed)
C/o lower abd pain that has been worse since yesterday.  Pt has crohn's.  Denies nausea and vomiting.

## 2012-04-09 NOTE — ED Notes (Signed)
Family at bedside. 

## 2012-04-09 NOTE — ED Provider Notes (Signed)
9:37 PM Pt w/ hx Crohn's dz is in CDU holding for CT abd/pelvis.  Patient reports pain is controlled if he is not moving.  Pt with crohn's exacerbation and now with perforation and small abscess.  Dr Maisie Fus has made call to surgery and now to GI.  Patient's PCP is Dr Jarome Matin, [facility].  On exam, pt is A&Ox4, NAD, RRR, CTAB, abd multiple surgical scars, NABS,soft, nondistended, ttp RLQ, RLQ mass at area of tenderness, no guarding, no rebound.  Plan is for admission.  Dr Maisie Fus is making phone calls.  Pt declines pain medication at this time.    Nicholas [name], [place] 04/10/12 [phone]

## 2012-04-09 NOTE — ED Provider Notes (Signed)
I have seen and examined this patient with the resident.  I agree with the resident's note, assessment and plan except as indicated.    Patient with right lower abdominal pain worse since yesterday.  He has a recent diagnosis of Crohn's disease based on a recent CAT scan.  Patient has significant tenderness on exam and his going to have a repeat CAT scan done today to further evaluate this.  Nat Christen, MD 04/09/12 2007

## 2012-04-09 NOTE — ED Provider Notes (Signed)
History     CSN: [id number]  Arrival date & time 04/09/12  1700   First MD Initiated Contact with Patient 04/09/12 1731      Chief Complaint  Patient presents with  . Crohn's Disease    (Consider location/radiation/quality/duration/timing/severity/associated sxs/prior treatment) HPI Comments: Pt says he was diagnosed with crohns several weeks ago.  Pain in RLQ worsened since yesterday.  Says this is worse than the pain he had with his prior crohns flare.  Tolerating PO at home.  Feels constipated.  Patient is a 71 y.o. male presenting with abdominal pain. The history is provided by the patient.  Abdominal Pain The primary symptoms of the illness include abdominal pain (RLQ). The primary symptoms of the illness do not include fever, fatigue, shortness of breath, nausea, vomiting, diarrhea or dysuria. The current episode started yesterday. The onset of the illness was gradual. The problem has been gradually worsening.  The patient has had a change in bowel habit (feels constipated).    Past Medical History  Diagnosis Date  . Hyperlipidemia   . PAF (paroxysmal atrial fibrillation)   . Aortic stenosis, mild   . Crohn's disease   . PAC (premature atrial contraction)   . Palpitations   . Macular degeneration   . Cataract   . Colonic hemorrhage 1964    Past Surgical History  Procedure Date  . Tonsillectomy   . Stomach surgery 1959    ulcer  . US echocardiography 05/20/2010    EF 55-60%  . US echocardiography 10/23/2005    EF 55-60%  . Cardiovascular stress test 11/29/2003    EF 64%  . Appendectomy   . Bladder repair 1964    intestine leaks/bladder repair    Family History  Problem Relation Age of Onset  . Heart disease Maternal Grandfather   . Heart disease      maternal side  . Colon cancer Neg Hx     History  Substance Use Topics  . Smoking status: Former Smoker    Quit date: 11/11/1963  . Smokeless tobacco: Never Used  . Alcohol Use: No      Review of  Systems  Constitutional: Negative for fever, activity change and fatigue.  HENT: Negative for congestion.   Eyes: Negative for pain.  Respiratory: Negative for chest tightness, shortness of breath, wheezing and stridor.   Cardiovascular: Negative for chest pain and leg swelling.  Gastrointestinal: Positive for abdominal pain (RLQ). Negative for nausea, vomiting and diarrhea.  Genitourinary: Negative for dysuria.  Musculoskeletal: Negative for arthralgias.  Skin: Negative for rash.  Neurological: Negative for headaches.  Psychiatric/Behavioral: Negative for behavioral problems.    Allergies  Review of patient's allergies indicates no known allergies.  Home Medications   Current Outpatient Rx  Name Route Sig Dispense Refill  . ASPIRIN 81 MG PO TBEC Oral Take 81 mg by mouth daily.      . BUDESONIDE ER 3 MG PO CP24 Oral Take 9 mg by mouth every morning.    [name] DIGOXIN 0.25 MG PO TABS Oral Take 250 mcg by mouth daily.    . FENOFIBRATE MICRONIZED 200 MG PO CAPS Oral Take 200 mg by mouth daily before breakfast.      . FERROUS SULFATE 325 (65 FE) MG PO TABS Oral Take 325 mg by mouth 2 (two) times daily.     [name] METOPROLOL TARTRATE 50 MG PO TABS Oral Take 25 mg by mouth 2 (two) times daily.     . OCUVITE-LUTEIN PO CAPS Oral  Take 1 capsule by mouth daily.      BP 131/62  Pulse 62  Temp 98.3 F (36.8 C)  Resp 18  SpO2 98%  Physical Exam  Constitutional: He is oriented to person, place, and time. He appears well-developed and well-nourished. No distress.  HENT:  Head: Normocephalic and atraumatic.  Eyes: Conjunctivae and EOM are normal. Pupils are equal, round, and reactive to light. No scleral icterus.  Neck: Normal range of motion. Neck supple.  Cardiovascular: Normal rate and regular rhythm.  Exam reveals no gallop and no friction rub.   No murmur heard. Pulmonary/Chest: Effort normal and breath sounds normal. No respiratory distress. He has no wheezes. He has no rales. He exhibits no  tenderness.  Abdominal: Soft. He exhibits no distension and no mass. There is tenderness (moderate RLQ). There is guarding (RLQ). There is no rebound.  Musculoskeletal: Normal range of motion. He exhibits no edema and no tenderness.  Neurological: He is alert and oriented to person, place, and time. He has normal reflexes. No cranial nerve deficit. He exhibits normal muscle tone. Coordination normal.  Skin: Skin is warm and dry. No rash noted. He is not diaphoretic. No erythema.  Psychiatric: He has a normal mood and affect. His behavior is normal. Judgment and thought content normal.    ED Course  Procedures (including critical care time)  Labs Reviewed  CBC - Abnormal; Notable for the following:    Hemoglobin 11.4 (*)    HCT 34.5 (*)    RDW 16.2 (*)    All other components within normal limits  DIFFERENTIAL - Abnormal; Notable for the following:    Neutrophils Relative 83 (*)    Neutro Abs 8.6 (*)    Lymphocytes Relative 9 (*)    All other components within normal limits  SEDIMENTATION RATE - Abnormal; Notable for the following:    Sed Rate 36 (*)    All other components within normal limits  COMPREHENSIVE METABOLIC PANEL - Abnormal; Notable for the following:    Sodium 134 (*)    Glucose, Bld 128 (*)    Albumin 3.0 (*)    GFR calc non Af Amer 84 (*)    All other components within normal limits  LIPASE, BLOOD  C-REACTIVE PROTEIN   No results found.   1. Abdominal pain       MDM  Pt says he was diagnosed with crohns several weeks ago.  Pain in RLQ worsened since yesterday.  Says this is worse than the pain he had with his prior crohns flare.  Tolerating PO at home.  Feels constipated.  Treating symptoms.  Will check labs and CT abdomen given tenderness.  Likely admission for symptom control.  Care transferred to the CDU.        Army Chaco, MD 04/09/12 2007

## 2012-04-09 NOTE — ED Notes (Signed)
Pt's family given Happy Meal, crackers, pretzels and apple juice. No other needs voiced.

## 2012-04-10 DIAGNOSIS — R1031 Right lower quadrant pain: Secondary | ICD-10-CM | POA: Diagnosis present

## 2012-04-10 DIAGNOSIS — R933 Abnormal findings on diagnostic imaging of other parts of digestive tract: Secondary | ICD-10-CM | POA: Insufficient documentation

## 2012-04-10 DIAGNOSIS — K5090 Crohn's disease, unspecified, without complications: Principal | ICD-10-CM

## 2012-04-10 LAB — COMPREHENSIVE METABOLIC PANEL
Albumin: 2.5 g/dL — ABNORMAL LOW (ref 3.5–5.2)
Alkaline Phosphatase: 47 U/L (ref 39–117)
BUN: 8 mg/dL (ref 6–23)
Calcium: 8.6 mg/dL (ref 8.4–10.5)
Creatinine, Ser: 0.87 mg/dL (ref 0.50–1.35)
GFR calc Af Amer: >90 mL/min (ref >90)
Glucose, Bld: 104 mg/dL — ABNORMAL HIGH (ref 70–99)
Potassium: 3.8 mEq/L (ref 3.5–5.1)
Total Protein: 5.8 g/dL — ABNORMAL LOW (ref 6.0–8.3)

## 2012-04-10 LAB — CBC
HCT: 31.5 % — ABNORMAL LOW (ref 39.0–52.0)
Hemoglobin: 10.1 g/dL — ABNORMAL LOW (ref 13.0–17.0)
MCV: 79.5 fL (ref 78.0–100.0)
RBC: 3.96 MIL/uL — ABNORMAL LOW (ref 4.22–5.81)
WBC: 7.2 10*3/uL (ref 4.0–10.5)

## 2012-04-10 LAB — PREALBUMIN: Prealbumin: 11.3 mg/dL — ABNORMAL LOW (ref 17.0–34.0)

## 2012-04-10 LAB — URINALYSIS, ROUTINE W REFLEX MICROSCOPIC
Ketones, ur: NEGATIVE mg/dL
Leukocytes, UA: NEGATIVE
Nitrite: NEGATIVE
pH: 7.5 (ref 5.0–8.0)

## 2012-04-10 LAB — C-REACTIVE PROTEIN: CRP: 13.06 mg/dL — ABNORMAL HIGH (ref <0.60)

## 2012-04-10 MED ORDER — BIOTENE DRY MOUTH MT LIQD
15.0000 mL | Freq: Two times a day (BID) | OROMUCOSAL | Status: DC
  Administered 2012-04-10 – 2012-04-19 (×15): 15 mL via OROMUCOSAL

## 2012-04-10 MED ORDER — CHLORHEXIDINE GLUCONATE 0.12 % MT SOLN
15.0000 mL | Freq: Two times a day (BID) | OROMUCOSAL | Status: DC
  Administered 2012-04-10 – 2012-04-21 (×20): 15 mL via OROMUCOSAL
  Filled 2012-04-10 (×22): qty 15

## 2012-04-10 NOTE — Progress Notes (Signed)
Subjective: Stable and alert. No vital signs within normal limits. Passing flatus but no stool last 24 hour period no urinary tract symptoms. No nausea or vomiting. As pain but controlled.  WBC 7200, hemoglobin 10.1.  CT scan reviewed. Inflammatory mass and phlegmon right lower quadrant. Significant stool burden and colon. No obstruction. Bladder looks normal.  History per patient is that he has had what sounds like a vagotomy and pyloroplasty for ulcer disease in the 1960's.  He subsequently was explored a single time for Crohn's disease, and according to him no intestinal resection was performed, simply division of fistula to his bladder. Saw Dr. Russella Dar in March and GI at [facility] after that.  Objective: Vital signs in last 24 hours: Temp:  [97.8 F (36.6 C)-98.9 F (37.2 C)] 97.8 F (36.6 C) (06/01 0535) Pulse Rate:  [50-71] 50  (06/01 0535) Resp:  [18] 18  (06/01 0535) BP: (107-147)/(56-78) 107/56 mmHg (06/01 0535) SpO2:  [96 %-100 %] 99 % (06/01 0535) Weight:  [146 lb 3.2 oz (66.316 kg)] 146 lb 3.2 oz (66.316 kg) (05/31 2351) Last BM Date: 04/08/12  Intake/Output from previous day: 05/31 0701 - 06/01 0700 In: 511 [I.V.:511] Out: 1200 [Urine:1200] Intake/Output this shift:    General appearance: alert. Pleasant. In no distress. Skin warm and dry. GI: abdomen soft. Not distended. Midline scars well healed. No hernias. Tender mass right lower quadrant, fixed.  Lab Results:   [facility] 04/10/12 0625 04/09/12 1758  WBC 7.2 10.4  HGB 10.1* 11.4*  HCT 31.5* 34.5*  PLT 286 303   BMET  Basename 04/10/12 0625 04/09/12 1758  NA 136 134*  K 3.8 3.8  CL 103 101  CO2 23 21  GLUCOSE 104* 128*  BUN 8 10  CREATININE 0.87 0.87  CALCIUM 8.6 9.0   PT/INR No results found for this basename: LABPROT:2,INR:2 in the last 72 hours ABG No results found for this basename: PHART:2,PCO2:2,PO2:2,HCO3:2 in the last 72 hours  Studies/Results: Ct Abdomen Pelvis W Contrast  04/09/2012   *RADIOLOGY REPORT*  Clinical Data: Right lower quadrant abdominal pain.  History of Crohn's disease.  Previous ulcer repair and bowel perforation surgery.  CT ABDOMEN AND PELVIS WITH CONTRAST  Technique:  Multidetector CT imaging of the abdomen and pelvis was performed following the standard protocol during bolus administration of intravenous contrast.  Contrast: OMNIPAQUE IOHEXOL 300 MG/ML  SOLN 100 ml Omnipaque- 300  Comparison: 12/22/2011.  Findings: Large amount of stool in the colon with distention of the colon by stool and gas.  Multiple distal small bowel loops with diffuse wall thickening and surrounding soft tissue stranding.  The loops in this region appear matted together.  These findings have progressed since the previous examination.  Again demonstrated is a possible fistula connecting the distal ileum and sigmoid colon.  There is a possible small amount of extraluminal air anteriorly, within an area of fluid density.  This area of fluid density containing gas or air has a thin surrounding wall with enhancement.  This collection measures 2.4 x 1.5 cm in maximum dimensions on sagittal image number 72 and 2.3 cm in transverse dimension on axial image number 68.  Small bilateral renal cysts are again demonstrated.  Unremarkable liver, a bilobed left liver cyst is also again is demonstrated, measuring 1.7 cm in maximum diameter on image number 15.  Unremarkable spleen, pancreas, gallbladder, adrenal glands and urinary bladder.  Mildly enlarged prostate gland. No significant change in mildly enlarged lymph nodes in the right lower quadrant  abdominal mesentery.  The largest has a short axis diameter of 1.2 cm on image number 51.  Mildly prominent portacaval and retroperitoneal lymph nodes are unchanged.  Clear lung bases.  Mild lumbar and lower thoracic spine degenerative changes.  IMPRESSION:  1.  Progressive changes of active Crohn's disease involving multiple distal loops of ileum in the right  pelvis, anteriorly. There is evidence of a small contained perforation anteriorly in the region with a possible early abscess forming, measuring 2.4 x 2.3 x 1.5 cm. 2.  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 3.  Stable mild right lower quadrant mesenteric adenopathy, most likely reactive. 4.  Prominent stool.  Original Report Authenticated By: Darrol Angel, M.D.    Anti-infectives: Anti-infectives     Start     Dose/Rate Route Frequency Ordered Stop   04/10/12 0000  piperacillin-tazobactam (ZOSYN) IVPB 3.375 g       3.375 g 12.5 mL/hr over 240 Minutes Intravenous 3 times per day 04/09/12 2345     04/09/12 2115  piperacillin-tazobactam (ZOSYN) IVPB 3.375 g       3.375 g 100 mL/hr over 30 Minutes Intravenous  Once 04/09/12 2114 04/09/12 2239          Assessment/Plan:  Recurrent Crohn's disease. Suspect stricture, phlegmon, possible early abscess formation, fistula to colon. No drainable fluid collection. No diffuse peritonitis No obstruction. No indication for urgent surgical exploration.  For now, we'll treat with IV antibiotics, bowel rest and analgesics.  Because he will almost certainly need exploration at some point, possibly this admission, would like for GI workup to proceed. Hopefully GI will consider colonoscopy and upper endoscopy. Colonoscopy may be difficult because of stool burden.  If he cannot be cleaned out for colonoscopy, then we will consider thin barium enema at some point.  I discussed the surgical issues with the patient. He is aware that he will ultimately need an operation.  Check prealbumin. May need TNA.   LOS: 1 day    Jiovanna Frei M. Derrell Lolling, M.D., [facility] General and Minimally invasive Surgery Breast and Colorectal Surgery Office:   [phone] Pager:   [phone]  04/10/2012

## 2012-04-10 NOTE — ED Provider Notes (Signed)
I have seen and examined this patient with the resident.  I agree with the resident's note, assessment and plan except as indicated.      Nat Christen, MD 04/10/12 Marlyne Beards

## 2012-04-10 NOTE — Consult Note (Signed)
Referring Provider: Dr. Derrell Lolling Primary Care Physician:  Garlan Fillers, MD, MD Primary Gastroenterologist:  Dr.Stark  Reason for Consultation: Crohns with RLQ inflammatory mass and Abscess  HPI: Nicholas [name] is a 71 y.o. 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 He also has remote history of peptic ulcer disease and underwent a Billroth II Billroth I. 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 Appendectomy is also status post appendectomy. Patient had been followed at [facility] under the care of Dr. Chesley Mires however his Crohn's had been quiet sent for many many years. He saw Dr Russella Dar once in March of 2013 when he presented after an episode of rectal bleeding complained of some intermittent abdominal pain. He was started on a course of budesonide 9 mg by mouth daily she has been taking since. Abdomen very mildly he had 2 attempts at colonoscopy per Dr. Sherin Quarry but neither of these was complete due  to poor prep, he has never had a full colonoscopy.  Patient reports that he has had problems over the past 5 months with intermittent lower abdominal pain primarily in the right lower quadrant. This has not been severe and he has been having regular bowel movements. His appetite has been decreased over the past several months and has lost about 20 pounds which she was not upset about. Has not had any recent fever or sweats. Appendectomy says earlier last week he was feeling just fine and then on Thursday evening started developing some right lower quadrant pain which became progressively worse yesterday and constant rate he also had a low-grade fever of 99 6 at home and presented to the emergency room for evaluation. He had not had any nausea or vomiting.  CT scan of the abdomen and pelvis is outlined below and shows a right lower quadrant inflammatory mass with associated small contained perforation and abscess. 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  He has been started on Zosyn, is n.p.o. and has had surgical evaluation. It is felt that he will need eventual surgery likely this admission. Surgery is requesting endoscopic evaluation preop if   Past Medical History  Diagnosis Date  . Hyperlipidemia   . PAF (paroxysmal atrial fibrillation)   . Aortic stenosis, mild   . Crohn's disease   . PAC (premature atrial contraction)   . Palpitations   . Macular degeneration   . Cataract   . Colonic hemorrhage 1964    Past Surgical History  Procedure Date  . Tonsillectomy   . Stomach surgery 1959    ulcer  . US echocardiography 05/20/2010    EF 55-60%  . US echocardiography 10/23/2005    EF 55-60%  . Cardiovascular stress test 11/29/2003    EF 64%  . Appendectomy   . Bladder repair 1964    intestine leaks/bladder repair    Prior to Admission medications   Medication Sig Start Date End Date Taking? Authorizing Provider  aspirin 81 MG EC tablet Take 81 mg by mouth daily.     Yes Historical Provider, MD  budesonide (ENTOCORT EC) 3 MG 24 hr capsule Take 9 mg by mouth every morning.   Yes Historical Provider, MD  digoxin (LANOXIN) 0.25 MG tablet Take 250 mcg by mouth daily.   Yes Historical Provider, MD  fenofibrate micronized (LOFIBRA) 200 MG capsule  Take 200 mg by mouth daily before breakfast.     Yes Historical Provider, MD  ferrous sulfate 325 (65 FE) MG tablet Take 325 mg by mouth 2 (two) times daily.    Yes Historical Provider, MD  metoprolol (LOPRESSOR) 50 MG tablet Take 25 mg by mouth 2 (two) times daily.  11/14/11  Yes Historical Provider, MD  multivitamin-lutein (OCUVITE-LUTEIN) CAPS Take 1 capsule by mouth daily.   Yes Historical Provider, MD    Current Facility-Administered Medications  Medication Dose Route Frequency Provider Last Rate Last Dose  . antiseptic oral rinse (BIOTENE) solution 15 mL  15 mL Mouth  Rinse q12n4p Thomas A. Cornett, MD      . chlorhexidine (PERIDEX) 0.12 % solution 15 mL  15 mL Mouth Rinse BID Thomas A. Cornett, MD   15 mL at 04/10/12 1029  . dextrose 5 % and 0.45 % NaCl with KCl 10 mEq/L infusion   Intravenous Continuous Thomas A. Cornett, MD 100 mL/hr at 04/10/12 1031    . digoxin (LANOXIN) 0.25 MG/ML injection 0.125 mg  0.125 mg Intravenous Daily Thomas A. Cornett, MD      . enoxaparin (LOVENOX) injection 40 mg  40 mg Subcutaneous Q24H Thomas A. Cornett, MD   40 mg at 04/10/12 0053  . HYDROmorphone (DILAUDID) injection 0.5 mg  0.5 mg Intravenous Once Army Chaco, MD   0.5 mg at 04/09/12 1755  . HYDROmorphone (DILAUDID) injection 1 mg  1 mg Intravenous Q2H PRN Thomas A. Cornett, MD      . iohexol (OMNIPAQUE) 300 MG/ML solution 100 mL  100 mL Intravenous Once PRN Medication Radiologist, MD   100 mL at 04/09/12 2029  . iohexol (OMNIPAQUE) 300 MG/ML solution 20 mL  20 mL Oral Q1 Hr x 2 Medication Radiologist, MD   20 mL at 04/09/12 1900  . metoprolol (LOPRESSOR) injection 10 mg  10 mg Intravenous Q6H Thomas A. Cornett, MD      . ondansetron (ZOFRAN) injection 4 mg  4 mg Intravenous Once Army Chaco, MD   4 mg at 04/09/12 1754  . ondansetron (ZOFRAN) injection 4 mg  4 mg Intravenous Q6H PRN Thomas A. Cornett, MD      . pantoprazole (PROTONIX) injection 40 mg  40 mg Intravenous QHS Thomas A. Cornett, MD      . piperacillin-tazobactam (ZOSYN) IVPB 3.375 g  3.375 g Intravenous Once Army Chaco, MD   3.375 g at 04/09/12 2209  . piperacillin-tazobactam (ZOSYN) IVPB 3.375 g  3.375 g Intravenous Q8H Thomas A. Cornett, MD   3.375 g at 04/10/12 0601  . sodium chloride 0.9 % bolus 1,000 mL  1,000 mL Intravenous Once Army Chaco, MD   1,000 mL at 04/09/12 1753  . DISCONTD: HYDROmorphone (DILAUDID) injection 0.5 mg  0.5 mg Intravenous Q30 min PRN Army Chaco, MD   0.5 mg at 04/09/12 2043    Allergies as of 04/09/2012  . (No Known Allergies)    Family History  Problem Relation  Age of Onset  . Heart disease Maternal Grandfather   . Heart disease      maternal side  . Colon cancer Neg Hx     History   Social History  . Marital Status: Married    Spouse Name: N/A    Number of Children: 3  . Years of Education: N/A   Occupational History  . Retired    Social History Main Topics  . Smoking status: Former Smoker    Quit date: 11/11/1963  . Smokeless  tobacco: Never Used  . Alcohol Use: No  . Drug Use: No  . Sexually Active: Not on file   Other Topics Concern  . Not on file   Social History Narrative   caffeinated drinks less than one a day    Review of Systems: Pertinent positive and negative review of systems were noted in the above HPI section.  All other review of systems was otherwise negative.  Physical Exam: Vital signs in last 24 hours: Temp:  [97.8 F (36.6 C)-98.9 F (37.2 C)] 97.8 F (36.6 C) (06/01 0535) Pulse Rate:  [50-71] 57  (06/01 1218) Resp:  [18] 18  (06/01 0535) BP: (107-147)/(50-78) 110/50 mmHg (06/01 1218) SpO2:  [96 %-100 %] 99 % (06/01 0535) Weight:  [146 lb 3.2 oz (66.316 kg)] 146 lb 3.2 oz (66.316 kg) (05/31 2351) Last BM Date: 04/08/12 General:   Alert,  Well-developed,thin pleasant and cooperative in NAD Head:  Normocephalic and atraumatic. Eyes:  Sclera clear, no icterus.   Conjunctiva pink. Ears:  Normal auditory acuity. Nose:  No deformity, discharge,  or lesions. Mouth:  No deformity or lesions.   Neck:  Supple; no masses or thyromegaly. Lungs:  Clear throughout to auscultation.   No wheezes, crackles, or rhonchi. Heart:  Regular rate and rhythm; no murmurs, clicks, rubs,  or gallops. Abdomen:  Soft,quite tender RLQ with fullness,and guarding BS+ Rectal-not done Msk:  Symmetrical without gross deformities. . Pulses:  Normal pulses noted. Extremities:  Without clubbing or edema. Neurologic:  Alert and  oriented x4;  grossly normal neurologically. Skin:  Intact without significant lesions or  rashes.. Psych:  Alert and cooperative. Normal mood and affect.  Intake/Output from previous day: 05/31 0701 - 06/01 0700 In: 511 [I.V.:511] Out: 1200 [Urine:1200] Intake/Output this shift:    Lab Results:  Basename 04/10/12 0625 04/09/12 1758  WBC 7.2 10.4  HGB 10.1* 11.4*  HCT 31.5* 34.5*  PLT 286 303   BMET  Basename 04/10/12 0625 04/09/12 1758  NA 136 134*  K 3.8 3.8  CL 103 101  CO2 23 21  GLUCOSE 104* 128*  BUN 8 10  CREATININE 0.87 0.87  CALCIUM 8.6 9.0   LFT  Basename 04/10/12 0625  PROT 5.8*  ALBUMIN 2.5*  AST 10  ALT 6  ALKPHOS 47  BILITOT 0.6  BILIDIR --  IBILI --     Studies/Results: Ct Abdomen Pelvis W Contrast  04/09/2012  *RADIOLOGY REPORT*  Clinical Data: Right lower quadrant abdominal pain.  History of Crohn's disease.  Previous ulcer repair and bowel perforation surgery.  CT ABDOMEN AND PELVIS WITH CONTRAST  Technique:  Multidetector CT imaging of the abdomen and pelvis was performed following the standard protocol during bolus administration of intravenous contrast.  Contrast: OMNIPAQUE IOHEXOL 300 MG/ML  SOLN 100 ml Omnipaque- 300  Comparison: 12/22/2011.  Findings: Large amount of stool in the colon with distention of the colon by stool and gas.  Multiple distal small bowel loops with diffuse wall thickening and surrounding soft tissue stranding.  The loops in this region appear matted together.  These findings have progressed since the previous examination.  Again demonstrated is a possible fistula connecting the distal ileum and sigmoid colon.  There is a possible small amount of extraluminal air anteriorly, within an area of fluid density.  This area of fluid density containing gas or air has a thin surrounding wall with enhancement.  This collection measures 2.4 x 1.5 cm in maximum dimensions on sagittal image number 72  and 2.3 cm in transverse dimension on axial image number 68.  Small bilateral renal cysts are again demonstrated.   Unremarkable liver, a bilobed left liver cyst is also again is demonstrated, measuring 1.7 cm in maximum diameter on image number 15.  Unremarkable spleen, pancreas, gallbladder, adrenal glands and urinary bladder.  Mildly enlarged prostate gland. No significant change in mildly enlarged lymph nodes in the right lower quadrant abdominal mesentery.  The largest has a short axis diameter of 1.2 cm on image number 51.  Mildly prominent portacaval and retroperitoneal lymph nodes are unchanged.  Clear lung bases.  Mild lumbar and lower thoracic spine degenerative changes.  IMPRESSION:  1.  Progressive changes of active Crohn's disease involving multiple distal loops of ileum in the right pelvis, anteriorly. There is evidence of a small contained perforation anteriorly in the region with a possible early abscess forming, measuring 2.4 x 2.3 x 1.5 cm. 2.  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 3.  Stable mild right lower quadrant mesenteric adenopathy, most likely reactive. 4.  Prominent stool.  Original Report Authenticated By: Darrol Angel, M.D.    IMPRESSION:  #69 71 year old male with long history of Crohn's disease which had been quiet sent for many years, now presenting with progressive right lower quadrant abdominal pain and 20 pound weight loss over the past by of months and CT findings consistent with right lower quadrant inflammatory mass supple loops of matted small bowel and a small contained perforation anteriorly with early abscess . 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 #2 normocytic anemia #3 status post remote Billroth I or peptic ulcer disease #4 history of atrial fibrillation #5 mild aortic stenosis PLAN: #1 agree with IV Zosyn , and bowel rest #2 agree patient will need surgery this admission. #3 unclear at this time whether or not he will be able to be prepped for a colonoscopy , certainly not at this time. Will  discuss and follow with you Amy Esterwood  04/10/2012, 12:28 PM      GI ATTENDING  History, laboratories, x-rays, and outside records reviewed. Patient personally seen and examined. Family present in his room. Agree with H&P as outlined above. Quite complicated case. Has had multiple prior surgeries. Appears to have complicated Crohn's disease with focal contained perforation abscess. 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 Physical exam reveals localized tenderness in the right lower abdomen. Otherwise negative.  IMPRESSION #1. Complicated Crohn's disease in the presence of altered anatomy from previous surgeries failed prior attempts at colonoscopy. Has been seen in [facility] as well as [facility].  PLAN #1. I agree that the first priority is antibiotics in hopes of resolving the phlegmon/abscess. #2 I agree with Dr. Derrell Lolling, that his anatomy needs to be best defined in order to optimize surgical therapies #3. I suspect that the best way of outlining his anatomy would be with contrast barium enema. This would be superior to colonoscopy, in terms of identifying and the finding fistulous disease. As well, information via colonoscopy may be limited due to a history of prior failed attempts. The event of colonoscopy, however, would be to assess for active Crohn's disease in areas that were available for examination #4. I will discuss this case with my GI colleagues, including Dr. Russella Dar, on Monday. We will return for followup at that time.  Thank you  Wilhemina Bonito. Eda Keys., M.D. [facility] Division of Gastroenterology

## 2012-04-11 LAB — PHOSPHORUS: Phosphorus: 2.7 mg/dL (ref 2.3–4.6)

## 2012-04-11 MED ORDER — KCL IN DEXTROSE-NACL 10-5-0.45 MEQ/L-%-% IV SOLN
INTRAVENOUS | Status: DC
  Filled 2012-04-11: qty 1000

## 2012-04-11 MED ORDER — POLYETHYLENE GLYCOL 3350 17 G PO PACK
17.0000 g | PACK | Freq: Two times a day (BID) | ORAL | Status: DC
  Administered 2012-04-11 – 2012-04-15 (×9): 17 g via ORAL
  Filled 2012-04-11 (×12): qty 1

## 2012-04-11 MED ORDER — KCL IN DEXTROSE-NACL 10-5-0.45 MEQ/L-%-% IV SOLN
INTRAVENOUS | Status: AC
  Administered 2012-04-11 – 2012-04-12 (×3): via INTRAVENOUS
  Filled 2012-04-11 (×5): qty 1000

## 2012-04-11 MED ORDER — KCL IN DEXTROSE-NACL 10-5-0.45 MEQ/L-%-% IV SOLN
INTRAVENOUS | Status: DC
  Filled 2012-04-11 (×2): qty 1000

## 2012-04-11 MED ORDER — CLINIMIX E/DEXTROSE (5/15) 5 % IV SOLN
INTRAVENOUS | Status: DC
  Filled 2012-04-11: qty 1000

## 2012-04-11 NOTE — Progress Notes (Signed)
Tap water enema given , well tolerated.

## 2012-04-11 NOTE — Progress Notes (Addendum)
PARENTERAL NUTRITION CONSULT NOTE - INITIAL  Pharmacy Consult for TPN Indication: protein cal malnutrition, weight loss, upcoming surgery for Crohn's disease  No Known Allergies  Patient Measurements: Height: 5\' 9"  (175.3 cm) Weight: 146 lb 3.2 oz (66.316 kg) IBW/kg (Calculated) : 70.7  Usual Weight: 20 lb weight loss over the last 6 months noted.  Vital Signs: Temp: 97.6 F (36.4 C) (06/02 0517) Temp src: Oral (06/02 0517) BP: 104/50 mmHg (06/02 0517) Pulse Rate: 62  (06/02 0944) Intake/Output from previous day: 06/01 0701 - 06/02 0700 In: 2401.7 [I.V.:2401.7] Out: 3300 [Urine:3300] Intake/Output from this shift:   Labs:  [facility] 04/10/12 0625 04/09/12 1758  WBC 7.2 10.4  HGB 10.1* 11.4*  HCT 31.5* 34.5*  PLT 286 303  APTT -- --  INR -- --    Basename 04/11/12 0855 04/10/12 0841 04/10/12 0625 04/09/12 1758  NA -- -- 136 134*  K -- -- 3.8 3.8  CL -- -- 103 101  CO2 -- -- 23 21  GLUCOSE -- -- 104* 128*  BUN -- -- 8 10  CREATININE -- -- 0.87 0.87  LABCREA -- -- -- --  CREAT24HRUR -- -- -- --  CALCIUM -- -- 8.6 9.0  MG 2.2 -- -- --  PHOS 2.7 -- -- --  PROT -- -- 5.8* 6.6  ALBUMIN -- -- 2.5* 3.0*  AST -- -- 10 13  ALT -- -- 6 7  ALKPHOS -- -- 47 49  BILITOT -- -- 0.6 0.6  BILIDIR -- -- -- --  IBILI -- -- -- --  PREALBUMIN -- 11.3* -- --  TRIG -- -- -- --  CHOLHDL -- -- -- --  CHOL -- -- -- --   Estimated Creatinine Clearance: 72 ml/min (by C-G formula based on Cr of 0.87).   No results found for this basename: GLUCAP:3 in the last 72 hours  Medical History: Past Medical History  Diagnosis Date  . Hyperlipidemia   . PAF (paroxysmal atrial fibrillation)   . Aortic stenosis, mild   . Crohn's disease   . PAC (premature atrial contraction)   . Palpitations   . Macular degeneration   . Cataract   . Colonic hemorrhage 1964    Medications:  Scheduled:    . antiseptic oral rinse  15 mL Mouth Rinse q12n4p  . chlorhexidine  15 mL Mouth Rinse BID    . digoxin  0.125 mg Intravenous Daily  . enoxaparin  40 mg Subcutaneous Q24H  . metoprolol  10 mg Intravenous Q6H  . pantoprazole (PROTONIX) IV  40 mg Intravenous QHS  . piperacillin-tazobactam (ZOSYN)  IV  3.375 g Intravenous Q8H  . polyethylene glycol  17 g Oral BID   Infusions:    . dextrose 5 % and 0.45 % NaCl with KCl 10 mEq/L 100 mL/hr at 04/10/12 2026   Insulin Requirements in the past 24 hours:  None  Current Nutrition:  Clear liquid diet: 360 ml intake recorded this am  Nutritional Goals:  1650-2000 kCal, 85-105 grams of protein per day  Assessment: 71 yo M w/ 35 year h/o Crohn's disease, admitted following 5 month h/o of intermittent abdominal pain.  H/o multiple GI surgeries in the 1950s/1960s including colon and SB surgery due to Crohn's.  Last treatment of Crohn's in Nov 2012 for hematochezia.  GI BM overnight, no N/V.  Awaiting GI advice re: possible colonoscopy to define anatomy for sugery, other management of Crohn's dz.  Trying Miralax and enema to cleanse bowel for now.  Likely will need exp lap this admission.  Endo No h/o DM. Glucose ok on BMP  Lytes WNL at baseline, [id number] at 100 ml/hr  Renal SCr stable at 0.87, UOP excellent: 2.1 ml/kg/hr  Hepatobil LFTs WNL  Pulm/Neuro RA  Cardiology BP well controlled  ID Afeb, WBC 7.2,  Empiric abx for abdominal abscess/phelgmon  Zosyn: 6/1>>   Px MC, PTX IV, SQ lovenox for dvt pxl   Plan:  -Start Clinimix E5/15 at 40 ml/hr with eventual goal of 80 ml/hr -Reassess goal after RD consult -MVI, TE, lipids at 10 ml/hr MWF only 2/2 national shortage -Decrease IVF to 60 ml/hr when tna starts -Start CBGs and sensitive scale SSI q6h -Order RD consult, tna labs  [employer]. Illene Bolus, PharmD, BCPS Clinical Pharmacist Pager: [phone] 04/11/2012 10:43 AM    Addendum: spoke with RN, IV team will not be able to place a PICC today - will f/u tomorrow for PICC placement.

## 2012-04-11 NOTE — Progress Notes (Signed)
Subjective: Dr. Lamar Sprinkles consultation and advised greatly appreciated. Will await further recommendations.  Patient states he feels better. States he had a bowel movement. No nausea. Not hungry. 20 pound weight loss last 6 months. Pain is present but better.  prealbumin 11.3.  Objective: Vital signs in last 24 hours: Temp:  [97.6 F (36.4 C)-98.7 F (37.1 C)] 97.6 F (36.4 C) (06/02 0517) Pulse Rate:  [50-58] 50  (06/02 0517) Resp:  [16-18] 18  (06/02 0517) BP: (100-126)/(50-73) 104/50 mmHg (06/02 0517) SpO2:  [97 %-100 %] 99 % (06/02 0517) Last BM Date: 04/08/12  Intake/Output from previous day: 06/01 0701 - 06/02 0700 In: 2401.7 [I.V.:2401.7] Out: 3300 [Urine:3300] Intake/Output this shift:    General appearance: alert. Mental status normal. No distress. Resp: clear to auscultation bilaterally GI: abdomen generally soft, nondistended. Tender mass right lower quadrant persists, no real change in size. No incisional hernia noted.  Lab Results:   [facility] 04/10/12 0625 04/09/12 1758  WBC 7.2 10.4  HGB 10.1* 11.4*  HCT 31.5* 34.5*  PLT 286 303   BMET  Basename 04/10/12 0625 04/09/12 1758  NA 136 134*  K 3.8 3.8  CL 103 101  CO2 23 21  GLUCOSE 104* 128*  BUN 8 10  CREATININE 0.87 0.87  CALCIUM 8.6 9.0   PT/INR No results found for this basename: LABPROT:2,INR:2 in the last 72 hours ABG No results found for this basename: PHART:2,PCO2:2,PO2:2,HCO3:2 in the last 72 hours  Studies/Results: Ct Abdomen Pelvis W Contrast  04/09/2012  *RADIOLOGY REPORT*  Clinical Data: Right lower quadrant abdominal pain.  History of Crohn's disease.  Previous ulcer repair and bowel perforation surgery.  CT ABDOMEN AND PELVIS WITH CONTRAST  Technique:  Multidetector CT imaging of the abdomen and pelvis was performed following the standard protocol during bolus administration of intravenous contrast.  Contrast: OMNIPAQUE IOHEXOL 300 MG/ML  SOLN 100 ml Omnipaque- 300  Comparison:  12/22/2011.  Findings: Large amount of stool in the colon with distention of the colon by stool and gas.  Multiple distal small bowel loops with diffuse wall thickening and surrounding soft tissue stranding.  The loops in this region appear matted together.  These findings have progressed since the previous examination.  Again demonstrated is a possible fistula connecting the distal ileum and sigmoid colon.  There is a possible small amount of extraluminal air anteriorly, within an area of fluid density.  This area of fluid density containing gas or air has a thin surrounding wall with enhancement.  This collection measures 2.4 x 1.5 cm in maximum dimensions on sagittal image number 72 and 2.3 cm in transverse dimension on axial image number 68.  Small bilateral renal cysts are again demonstrated.  Unremarkable liver, a bilobed left liver cyst is also again is demonstrated, measuring 1.7 cm in maximum diameter on image number 15.  Unremarkable spleen, pancreas, gallbladder, adrenal glands and urinary bladder.  Mildly enlarged prostate gland. No significant change in mildly enlarged lymph nodes in the right lower quadrant abdominal mesentery.  The largest has a short axis diameter of 1.2 cm on image number 51.  Mildly prominent portacaval and retroperitoneal lymph nodes are unchanged.  Clear lung bases.  Mild lumbar and lower thoracic spine degenerative changes.  IMPRESSION:  1.  Progressive changes of active Crohn's disease involving multiple distal loops of ileum in the right pelvis, anteriorly. There is evidence of a small contained perforation anteriorly in the region with a possible early abscess forming, measuring 2.4 x 2.3 x  1.5 cm. 2.  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 3.  Stable mild right lower quadrant mesenteric adenopathy, most likely reactive. 4.  Prominent stool.  Original Report Authenticated By: Darrol Angel, M.D.    Anti-infectives: Anti-infectives      Start     Dose/Rate Route Frequency Ordered Stop   04/10/12 0000  piperacillin-tazobactam (ZOSYN) IVPB 3.375 g       3.375 g 12.5 mL/hr over 240 Minutes Intravenous 3 times per day 04/09/12 2345     04/09/12 2115  piperacillin-tazobactam (ZOSYN) IVPB 3.375 g       3.375 g 100 mL/hr over 30 Minutes Intravenous  Once 04/09/12 2114 04/09/12 2239          Assessment/Plan:  Recurrent Crohn's disease. Suspect stricture, phlegmon, possible early abscess formation, fistula from small bowel to sigmoid. No evidence for peritonitis, drainable fluid collection, or bowel obstruction. No indication for urgent surgical exploration.  Protein calorie malnutrition. Will insert PICC line today and initiate TNA. Discussed with patient.  He will almost certainly need exploration at some point and probably this admission. Before that occurs I would like to provide nutritional support, treat his infection until it has improved/stabilized,  abd define his anatomy, if possible.  Will we will start giving him some MiraLAX today and an enema to see if we can clean some of the stool out. This may or may not work if he has a enterocolonic fistula and bypass.  Will await gastroenterology advice regarding management of Crohn's as well as feasibility of colonoscopy. If colonoscopy is not feasible, then we will clean him out as much as possible and try to do a thin barium enema at some point.   LOS: 2 days    Carmela Piechowski M. Derrell Lolling, M.D., [facility] General and Minimally invasive Surgery Breast and Colorectal Surgery Office:   [phone] Pager:   [phone]  04/11/2012

## 2012-04-11 NOTE — ED Provider Notes (Signed)
Medical screening examination/treatment/procedure(s) were conducted as a shared visit with non-physician practitioner(s) and myself.  I personally evaluated the patient during the encounter   Berneta Sconyers A Armilda Vanderlinden, MD 04/11/12 1133 

## 2012-04-12 DIAGNOSIS — R109 Unspecified abdominal pain: Secondary | ICD-10-CM

## 2012-04-12 LAB — CBC
HCT: 29.4 % — ABNORMAL LOW (ref 39.0–52.0)
Hemoglobin: 9.5 g/dL — ABNORMAL LOW (ref 13.0–17.0)
MCH: 25.6 pg — ABNORMAL LOW (ref 26.0–34.0)
MCHC: 32.3 g/dL (ref 30.0–36.0)
MCV: 79.2 fL (ref 78.0–100.0)
Platelets: 325 10*3/uL (ref 150–400)
RBC: 3.71 MIL/uL — ABNORMAL LOW (ref 4.22–5.81)
RDW: 16.1 % — ABNORMAL HIGH (ref 11.5–15.5)
WBC: 4.7 10*3/uL (ref 4.0–10.5)

## 2012-04-12 LAB — COMPREHENSIVE METABOLIC PANEL
ALT: 11 U/L (ref 0–53)
AST: 20 U/L (ref 0–37)
Albumin: 2.5 g/dL — ABNORMAL LOW (ref 3.5–5.2)
Alkaline Phosphatase: 49 U/L (ref 39–117)
BUN: 4 mg/dL — ABNORMAL LOW (ref 6–23)
CO2: 27 mEq/L (ref 19–32)
Calcium: 8.6 mg/dL (ref 8.4–10.5)
Chloride: 106 mEq/L (ref 96–112)
Creatinine, Ser: 1.05 mg/dL (ref 0.50–1.35)
GFR calc Af Amer: 80 mL/min — ABNORMAL LOW (ref >90)
GFR calc non Af Amer: 69 mL/min — ABNORMAL LOW (ref >90)
Glucose, Bld: 96 mg/dL (ref 70–99)
Potassium: 4.3 mEq/L (ref 3.5–5.1)
Sodium: 140 mEq/L (ref 135–145)
Total Bilirubin: 0.3 mg/dL (ref 0.3–1.2)
Total Protein: 5.7 g/dL — ABNORMAL LOW (ref 6.0–8.3)

## 2012-04-12 LAB — GLUCOSE, CAPILLARY: Glucose-Capillary: 126 mg/dL — ABNORMAL HIGH (ref 70–99)

## 2012-04-12 LAB — DIFFERENTIAL
Basophils Absolute: 0 10*3/uL (ref 0.0–0.1)
Basophils Relative: 0 % (ref 0–1)
Eosinophils Absolute: 0.3 10*3/uL (ref 0.0–0.7)
Eosinophils Relative: 5 % (ref 0–5)
Lymphocytes Relative: 13 % (ref 12–46)
Lymphs Abs: 0.6 10*3/uL — ABNORMAL LOW (ref 0.7–4.0)
Monocytes Absolute: 0.4 10*3/uL (ref 0.1–1.0)
Monocytes Relative: 8 % (ref 3–12)
Neutro Abs: 3.4 10*3/uL (ref 1.7–7.7)
Neutrophils Relative %: 73 % (ref 43–77)

## 2012-04-12 LAB — TRIGLYCERIDES: Triglycerides: 106 mg/dL (ref <150)

## 2012-04-12 LAB — PHOSPHORUS: Phosphorus: 2.9 mg/dL (ref 2.3–4.6)

## 2012-04-12 LAB — CHOLESTEROL, TOTAL: Cholesterol: 94 mg/dL (ref 0–200)

## 2012-04-12 LAB — MAGNESIUM: Magnesium: 2.2 mg/dL (ref 1.5–2.5)

## 2012-04-12 MED ORDER — INSULIN ASPART 100 UNIT/ML ~~LOC~~ SOLN
0.0000 [IU] | Freq: Four times a day (QID) | SUBCUTANEOUS | Status: DC
  Administered 2012-04-12: 1 [IU] via SUBCUTANEOUS

## 2012-04-12 MED ORDER — INSULIN ASPART 100 UNIT/ML ~~LOC~~ SOLN
0.0000 [IU] | Freq: Four times a day (QID) | SUBCUTANEOUS | Status: DC
  Administered 2012-04-12 – 2012-04-14 (×4): 1 [IU] via SUBCUTANEOUS
  Administered 2012-04-15: 2 [IU] via SUBCUTANEOUS
  Administered 2012-04-16 – 2012-04-17 (×2): 1 [IU] via SUBCUTANEOUS
  Administered 2012-04-17 (×2): 2 [IU] via SUBCUTANEOUS
  Administered 2012-04-18 (×3): 1 [IU] via SUBCUTANEOUS
  Administered 2012-04-18 – 2012-04-20 (×6): 2 [IU] via SUBCUTANEOUS
  Administered 2012-04-20: 3 [IU] via SUBCUTANEOUS
  Administered 2012-04-20 – 2012-04-21 (×3): 2 [IU] via SUBCUTANEOUS

## 2012-04-12 MED ORDER — ZINC TRACE METAL 1 MG/ML IV SOLN
INTRAVENOUS | Status: AC
  Administered 2012-04-12: 18:00:00 via INTRAVENOUS
  Filled 2012-04-12: qty 1000

## 2012-04-12 MED ORDER — SODIUM CHLORIDE 0.9 % IJ SOLN
10.0000 mL | INTRAMUSCULAR | Status: DC | PRN
  Administered 2012-04-12 – 2012-04-26 (×9): 10 mL

## 2012-04-12 MED ORDER — FAT EMULSION 20 % IV EMUL
250.0000 mL | INTRAVENOUS | Status: AC
  Administered 2012-04-12: 250 mL via INTRAVENOUS
  Filled 2012-04-12: qty 250

## 2012-04-12 MED ORDER — KCL IN DEXTROSE-NACL 10-5-0.45 MEQ/L-%-% IV SOLN
INTRAVENOUS | Status: AC
  Administered 2012-04-12 (×2): via INTRAVENOUS
  Filled 2012-04-12 (×2): qty 1000

## 2012-04-12 NOTE — Progress Notes (Signed)
Patient seen, examined, and I agree with the above documentation, including the assessment and plan. Much less pain overall.  Had spontaneous BM this pm (small and loose) just before I saw him. Abd apparently less tender, without significant distention On abx. Agree with GG enema to better define colon and distal ileal anatomy, esp given high suspicion for fistulizing disease.  Colonoscopy would be very difficult and given we already know he has Crohn's likely not too beneficial at present. Hopefully GG enema will help to define anatomy and aid with surgical planning. Would not alter Crohn's med now esp in light of concern for infection/phelgmon and pending surgery. I expect in the post-operative period he will need biologic therapy (which we discussed briefly) given Crohn's with fistula.  This will be deferred to Dr. Chesley Mires at [facility] after surgery.

## 2012-04-12 NOTE — Progress Notes (Signed)
I have personally interviewed and examined the patient today. He has continued to have some bowel movements.  Abdominal exam is somewhat improved. Still has a tender mass but it is less tender. The rest of his abdomen is benign.  We will plan to get abdominal films tomorrow to see what his stool burden is, and decide whether to further bowel prep him or to proceed with Gastrografin enema thereafter.  Ultimately, he will need exploratory laparotomy and resection of his disease, possible colostomy.   Angelia Mould. Derrell Lolling, M.D., [facility] General and Minimally invasive Surgery Breast and Colorectal Surgery Office:   [phone] Pager:   [phone]

## 2012-04-12 NOTE — Progress Notes (Signed)
     [facility] Daily Rounding Note 04/12/2012, 12:26 PM  SUBJECTIVE:       Pain in RLQ, worse when he walks.  No nausea on clears.  2 small BMs 6/2 after enema, one small bm overnight.    OBJECTIVE:        General: looks well     Vital signs in last 24 hours:    Temp:  [97.5 F (36.4 C)-98 F (36.7 C)] 97.8 F (36.6 C) (06/03 1142) Pulse Rate:  [50-58] 51  (06/03 1142) Resp:  [17-18] 17  (06/03 0527) BP: (100-127)/(50-79) 123/79 mmHg (06/03 1142) SpO2:  [99 %-100 %] 100 % (06/03 1142) Last BM Date: 04/12/12  Heart: RRR Chest: clear.  Abdomen: mass in RLQ which is moderately tender.    Extremities: no pedal edema Neuro/Psych:  No tremor.  Fully oriented and appropriate.   Intake/Output from previous day: 06/02 0701 - 06/03 0700 In: 3518.3 [P.O.:1080; I.V.:2438.3] Out: 500 [Urine:500]  Intake/Output this shift:    Lab Results:  Basename 04/12/12 0526 04/10/12 0625 04/09/12 1758  WBC 4.7 7.2 10.4  HGB 9.5* 10.1* 11.4*  HCT 29.4* 31.5* 34.5*  PLT 325 286 303   BMET  Basename 04/12/12 0526 04/10/12 0625 04/09/12 1758  NA 140 136 134*  K 4.3 3.8 3.8  CL 106 103 101  CO2 27 23 21   GLUCOSE 96 104* 128*  BUN 4* 8 10  CREATININE 1.05 0.87 0.87  CALCIUM 8.6 8.6 9.0   LFT  Basename 04/12/12 0526 04/10/12 0625 04/09/12 1758  PROT 5.7* 5.8* 6.6  ALBUMIN 2.5* 2.5* 3.0*  AST 20 10 13   ALT 11 6 7   ALKPHOS 49 47 49  BILITOT 0.3 0.6 0.6  BILIDIR -- -- --  IBILI -- -- --    ASSESMENT: * RLQ inflammatory mass with small abcess,/contained perf.  ? Ileosigmoid fistula. On Zosyn.  *  Altered colon anatomy due to remote surgeries for Crohn's disease.  *  Normocytic anemia.  Borderline low normal MCV.  On po Iron at home. * hx PAF.  No coumadin etc in place PTA  PLAN: *  Surgery planning GG enema once they have purged enough stool to allow for accurate GGE They are continuing "prep" with BID Miralax. ? Does he need additional Laxatives   LOS: 3 days   Nicholas [name]  04/12/2012, 12:26 PM Pager: [phone]

## 2012-04-12 NOTE — Progress Notes (Signed)
Patient ID: Nicholas [name], male   DOB: February 06, 1941, 71 y.o.   MRN: [id number]    Subjective: Pt feels ok today.  Had a couple small BMs yesterday.  Feels a little bloated today.  Tolerating clears.  No nausea.  Objective: Vital signs in last 24 hours: Temp:  [97.5 F (36.4 C)-98 F (36.7 C)] 98 F (36.7 C) (06/03 0527) Pulse Rate:  [50-62] 50  (06/03 0527) Resp:  [17-18] 17  (06/03 0527) BP: (100-127)/(50-70) 113/56 mmHg (06/03 0527) SpO2:  [99 %-100 %] 99 % (06/03 0527) Last BM Date: 04/12/12  Intake/Output from previous day: 06/02 0701 - 06/03 0700 In: 3518.3 [P.O.:1080; I.V.:2438.3] Out: 500 [Urine:500] Intake/Output this shift:    PE: Abd: soft, mild distention, hypoactive BS, mild RLQ tenderness. Heart: regular Lungs: CTAB  Lab Results:   Basename 04/12/12 0526 04/10/12 0625  WBC 4.7 7.2  HGB 9.5* 10.1*  HCT 29.4* 31.5*  PLT 325 286   BMET  Basename 04/12/12 0526 04/10/12 0625  NA 140 136  K 4.3 3.8  CL 106 103  CO2 27 23  GLUCOSE 96 104*  BUN 4* 8  CREATININE 1.05 0.87  CALCIUM 8.6 8.6   PT/INR No results found for this basename: LABPROT:2,INR:2 in the last 72 hours CMP     Component Value Date/Time   NA 140 04/12/2012 0526   K 4.3 04/12/2012 0526   CL 106 04/12/2012 0526   CO2 27 04/12/2012 0526   GLUCOSE 96 04/12/2012 0526   BUN 4* 04/12/2012 0526   CREATININE 1.05 04/12/2012 0526   CALCIUM 8.6 04/12/2012 0526   PROT 5.7* 04/12/2012 0526   ALBUMIN 2.5* 04/12/2012 0526   AST 20 04/12/2012 0526   ALT 11 04/12/2012 0526   ALKPHOS 49 04/12/2012 0526   BILITOT 0.3 04/12/2012 0526   GFRNONAA 69* 04/12/2012 0526   GFRAA 80* 04/12/2012 0526   Lipase     Component Value Date/Time   LIPASE 27 04/09/2012 1758       Studies/Results: No results found.  Anti-infectives: Anti-infectives     Start     Dose/Rate Route Frequency Ordered Stop   04/10/12 0000  piperacillin-tazobactam (ZOSYN) IVPB 3.375 g       3.375 g 12.5 mL/hr over 240 Minutes Intravenous 3 times per day  04/09/12 2345     04/09/12 2115  piperacillin-tazobactam (ZOSYN) IVPB 3.375 g       3.375 g 100 mL/hr over 30 Minutes Intravenous  Once 04/09/12 2114 04/09/12 2239           Assessment/Plan  1. 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 A fib, currently NSR  Plan: 1. Will try and continue to clean out colon if possible.  Will give another tap water enema today and cont BID miralax.  I will recheck an x-ray in the morning to determine what his stool burden looks like.  If he is fairly well cleaned out then we will get a gastrografin enema to better determine his anatomy prior to surgery.  If he is not well cleaned out, then we will not be able to get the enema. 2. Pt will likely need surgery at some point this admission.  LOS: 3 days    Somaly Marteney E 04/12/2012

## 2012-04-12 NOTE — Progress Notes (Signed)
PARENTERAL NUTRITION CONSULT NOTE - INITIAL  Pharmacy Consult for TPN Indication: protein cal malnutrition, weight loss, upcoming surgery for Crohn's disease  No Known Allergies  Patient Measurements: Height: 5\' 9"  (175.3 cm) Weight: 146 lb 3.2 oz (66.316 kg) IBW/kg (Calculated) : 70.7  Usual Weight: 20 lb weight loss over the last 6 months noted.  Vital Signs: Temp: 97.7 F (36.5 C) (06/03 1433) Temp src: Oral (06/03 0527) BP: 131/74 mmHg (06/03 1433) Pulse Rate: 76  (06/03 1433) Intake/Output from previous day: 06/02 0701 - 06/03 0700 In: 3518.3 [P.O.:1080; I.V.:2438.3] Out: 500 [Urine:500] Intake/Output from this shift:   Labs:  [facility] 04/12/12 0526 04/10/12 0625 04/09/12 1758  WBC 4.7 7.2 10.4  HGB 9.5* 10.1* 11.4*  HCT 29.4* 31.5* 34.5*  PLT 325 286 303  APTT -- -- --  INR -- -- --    Basename 04/12/12 0526 04/11/12 0855 04/10/12 0841 04/10/12 0625 04/09/12 1758  NA 140 -- -- 136 134*  K 4.3 -- -- 3.8 3.8  CL 106 -- -- 103 101  CO2 27 -- -- 23 21  GLUCOSE 96 -- -- 104* 128*  BUN 4* -- -- 8 10  CREATININE 1.05 -- -- 0.87 0.87  LABCREA -- -- -- -- --  CREAT24HRUR -- -- -- -- --  CALCIUM 8.6 -- -- 8.6 9.0  MG 2.2 2.2 -- -- --  PHOS 2.9 2.7 -- -- --  PROT 5.7* -- -- 5.8* 6.6  ALBUMIN 2.5* -- -- 2.5* 3.0*  AST 20 -- -- 10 13  ALT 11 -- -- 6 7  ALKPHOS 49 -- -- 47 49  BILITOT 0.3 -- -- 0.6 0.6  BILIDIR -- -- -- -- --  IBILI -- -- -- -- --  PREALBUMIN -- -- 11.3* -- --  TRIG 106 -- -- -- --  CHOLHDL -- -- -- -- --  CHOL 94 -- -- -- --   Estimated Creatinine Clearance: 59.6 ml/min (by C-G formula based on Cr of 1.05).   No results found for this basename: GLUCAP:3 in the last 72 hours  Medical History: Past Medical History  Diagnosis Date  . Hyperlipidemia   . PAF (paroxysmal atrial fibrillation)   . Aortic stenosis, mild   . Crohn's disease   . PAC (premature atrial contraction)   . Palpitations   . Macular degeneration   . Cataract   .  Colonic hemorrhage 1964    Medications:  Scheduled:     . antiseptic oral rinse  15 mL Mouth Rinse q12n4p  . chlorhexidine  15 mL Mouth Rinse BID  . digoxin  0.125 mg Intravenous Daily  . enoxaparin  40 mg Subcutaneous Q24H  . metoprolol  10 mg Intravenous Q6H  . pantoprazole (PROTONIX) IV  40 mg Intravenous QHS  . piperacillin-tazobactam (ZOSYN)  IV  3.375 g Intravenous Q8H  . polyethylene glycol  17 g Oral BID   Infusions:     . dextrose 5 % and 0.45 % NaCl with KCl 10 mEq/L 100 mL/hr at 04/12/12 1136   Insulin Requirements in the past 24 hours:  None  Current Nutrition:  Clear liquid diet: 1080 ml intake recorded this am  Nutritional Goals:  1850-2100 kCal, 85-105 grams of protein per day  Assessment: 71 yo M w/ 35 year h/o Crohn's disease, admitted following 5 month h/o of intermittent abdominal pain.  H/o multiple GI surgeries in the 1950s/1960s including colon and SB surgery due to Crohn's.  Last treatment of Crohn's in Nov 2012  for hematochezia.  IV team unable to place PICC Sunday June 3rd so initiation delayed.  However, RN just confirmed PICC placement.  GI BM overnight, no N/V.  Awaiting GI advice re: possible colonoscopy to define anatomy for sugery, other management of Crohn's dz.  Trying Miralax and enema to cleanse bowel for now.  Likely will need exp lap this admission.  Endo No h/o DM. Glucose ok on BMP  Lytes WNL at baseline, [id number] at 100 ml/hr  Renal SCr stable at 1.05, UOP decreased as recorded: 0.3 ml/kg/hr (possibly inaccurate?)  Hepatobil LFTs WNL.  TG noted  Pulm/Neuro RA  Cardiology BP well controlled - on lopressor 10 mg IV q6h standing  ID Afeb, WBC 4.7,  Empiric abx for abdominal abscess/phelgmon  Zosyn: 6/1>>   Px MC, PTX IV, SQ lovenox for dvt pxl   Plan:  -Start Clinimix E5/20 at 40 ml/hr with eventual goal of 80 ml/hr (provides weekly average of 1895 kcal and 96 g protein per day) -MVI, TE, lipids at 10 ml/hr MWF only 2/2 national  shortage -Decrease IVF to 60 ml/hr when tna starts -Start CBGs and sensitive scale SSI q6h -f/u am BMP, Mag, Phos, f/u pending  Sophira Rumler L. Illene Bolus, PharmD, BCPS Clinical Pharmacist Pager: [phone] 04/12/2012 2:46 PM

## 2012-04-12 NOTE — Progress Notes (Signed)
INITIAL ADULT NUTRITION ASSESSMENT Date: 04/12/2012   Time: 12:07 PM Reason for Assessment: consult, new TPN  ASSESSMENT: Male 71 y.o.  Dx: abdominal pain  Hx:  Past Medical History  Diagnosis Date  . Hyperlipidemia   . PAF (paroxysmal atrial fibrillation)   . Aortic stenosis, mild   . Crohn's disease   . PAC (premature atrial contraction)   . Palpitations   . Macular degeneration   . Cataract   . Colonic hemorrhage 1964   Past Surgical History  Procedure Date  . Tonsillectomy   . Stomach surgery 1959    ulcer  . US echocardiography 05/20/2010    EF 55-60%  . US echocardiography 10/23/2005    EF 55-60%  . Cardiovascular stress test 11/29/2003    EF 64%  . Appendectomy   . Bladder repair 1964    intestine leaks/bladder repair    Related Meds:  Scheduled Meds:   . antiseptic oral rinse  15 mL Mouth Rinse q12n4p  . chlorhexidine  15 mL Mouth Rinse BID  . digoxin  0.125 mg Intravenous Daily  . enoxaparin  40 mg Subcutaneous Q24H  . metoprolol  10 mg Intravenous Q6H  . pantoprazole (PROTONIX) IV  40 mg Intravenous QHS  . piperacillin-tazobactam (ZOSYN)  IV  3.375 g Intravenous Q8H  . polyethylene glycol  17 g Oral BID   Continuous Infusions:   . dextrose 5 % and 0.45 % NaCl with KCl 10 mEq/L 100 mL/hr at 04/12/12 1136   PRN Meds:.HYDROmorphone (DILAUDID) injection, ondansetron   Ht: 5\' 9"  (175.3 cm)  Wt: 146 lb 3.2 oz (66.316 kg)  Ideal Wt: 72.7 cm % Ideal Wt: 91%  Usual Wt: 162-164 lbs per pt report % Usual Wt: 90%  Body mass index is 21.59 kg/(m^2).  Food/Nutrition Related Hx: h/o Crohn's disease managed with surgery, few flare ups in ~50 yrs.   Labs:  CMP     Component Value Date/Time   NA 140 04/12/2012 0526   K 4.3 04/12/2012 0526   CL 106 04/12/2012 0526   CO2 27 04/12/2012 0526   GLUCOSE 96 04/12/2012 0526   BUN 4* 04/12/2012 0526   CREATININE 1.05 04/12/2012 0526   CALCIUM 8.6 04/12/2012 0526   PROT 5.7* 04/12/2012 0526   ALBUMIN 2.5* 04/12/2012 0526   AST 20 04/12/2012 0526   ALT 11 04/12/2012 0526   ALKPHOS 49 04/12/2012 0526   BILITOT 0.3 04/12/2012 0526   GFRNONAA 69* 04/12/2012 0526   GFRAA 80* 04/12/2012 0526    CBC    Component Value Date/Time   WBC 4.7 04/12/2012 0526   RBC 3.71* 04/12/2012 0526   HGB 9.5* 04/12/2012 0526   HCT 29.4* 04/12/2012 0526   PLT 325 04/12/2012 0526   MCV 79.2 04/12/2012 0526   MCH 25.6* 04/12/2012 0526   MCHC 32.3 04/12/2012 0526   RDW 16.1* 04/12/2012 0526   LYMPHSABS 0.6* 04/12/2012 0526   MONOABS 0.4 04/12/2012 0526   EOSABS 0.3 04/12/2012 0526   BASOSABS 0.0 04/12/2012 0526    Intake: 50-100% clear liquids Output:   Intake/Output Summary (Last 24 hours) at 04/12/12 1212 Last data filed at 04/12/12 8295  Gross per 24 hour  Intake 3158.33 ml  Output    500 ml  Net 2658.33 ml   1 BM today  Diet Order: Clear Liquid  Supplements/Tube Feeding:  IVF:    dextrose 5 % and 0.45 % NaCl with KCl 10 mEq/L Last Rate: 100 mL/hr at 04/12/12 1136  Estimated Nutritional Needs:   Kcal: 1870-2090 kcal Protein: 89-104g Fluid: >2.2 L/day  Pt admitted with abdominal pain r/t to 6 month h/o Crohn's flare.  Pt states that his abdominal pain started in December, had a hemorrhagic event in January, with continued wt loss leading to admission over the weekend.  Pt states his pain was so great that he could not sit up.  Pt reports he typically weighs 162-164 lbs, and has lost 10.9% of his usual body weight in 6 months.  Pt reports that at baseline he cannot eat raw fruits or vegetables- can sometimes eat 1/8 of an apple or 2-3 cherries.  Pt states he typically has 4-6 BMs/day, very rarely goes without having a BM. Recently he has not been eating very much food.  He has been attempting to eat at meal times; however with extremely small portions.  Now with increased pressure in his abdomen it is more comfortable for him not to eat.    Pt qualifies for severe malnutrition of chronic illness due to wt loss >10% in 6 months, and PO intake  less than or equal to 75% for >1 month.   Pt is planning to start TPN once PICC line can be placed.  NUTRITION DIAGNOSIS: -Inadequate oral intake (NI-2.1).  Status: Ongoing  RELATED TO: abdominal pain  AS EVIDENCE BY: pt to remain on clear liquids, possible upcoming surgery for Crohn's  MONITORING/EVALUATION(Goals): 1.  Food/Beverage; pt tolerating clear liquids and consuming as desired 2.  Parenteral nutrition; pt to meet 90-100% estimated needs with TPN.  EDUCATION NEEDS: -Education needs addressed with pt  INTERVENTION: 1.  Parenteral nutrition; initiation and management per PharmD.  Pt to meet 90-100% estimated needs with TPN.  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

## 2012-04-13 ENCOUNTER — Inpatient Hospital Stay (HOSPITAL_COMMUNITY): Payer: Medicare Other

## 2012-04-13 LAB — BASIC METABOLIC PANEL
CO2: 26 mEq/L (ref 19–32)
Calcium: 8.6 mg/dL (ref 8.4–10.5)
Glucose, Bld: 130 mg/dL — ABNORMAL HIGH (ref 70–99)
Potassium: 3.7 mEq/L (ref 3.5–5.1)
Sodium: 140 mEq/L (ref 135–145)

## 2012-04-13 LAB — GLUCOSE, CAPILLARY
Glucose-Capillary: 131 mg/dL — ABNORMAL HIGH (ref 70–99)
Glucose-Capillary: 106 mg/dL — ABNORMAL HIGH (ref 70–99)

## 2012-04-13 LAB — PHOSPHORUS: Phosphorus: 2.9 mg/dL (ref 2.3–4.6)

## 2012-04-13 MED ORDER — CLINIMIX E/DEXTROSE (5/20) 5 % IV SOLN
INTRAVENOUS | Status: AC
  Administered 2012-04-13: 18:00:00 via INTRAVENOUS
  Filled 2012-04-13: qty 2000

## 2012-04-13 MED ORDER — IOHEXOL 300 MG/ML  SOLN
450.0000 mL | Freq: Once | INTRAMUSCULAR | Status: AC | PRN
  Administered 2012-04-13: 450 mL

## 2012-04-13 MED ORDER — METOPROLOL TARTRATE 25 MG PO TABS
25.0000 mg | ORAL_TABLET | Freq: Two times a day (BID) | ORAL | Status: DC
  Administered 2012-04-14 – 2012-04-16 (×3): 25 mg via ORAL
  Filled 2012-04-13 (×7): qty 1

## 2012-04-13 MED ORDER — SODIUM CHLORIDE 0.9 % IV SOLN
INTRAVENOUS | Status: DC
  Administered 2012-04-13: 1000 mL via INTRAVENOUS
  Administered 2012-04-14: 250 mL via INTRAVENOUS
  Administered 2012-04-15: 19:00:00 via INTRAVENOUS

## 2012-04-13 NOTE — Progress Notes (Signed)
Patient seen, examined, and I agree with the above documentation, including the assessment and plan.    SINGLE COLUMN BARIUM ENEMA WITH WATER SOLUBLE CONTRAST  Technique: Water soluble contrast (450 ml Omnipaque-300 in 1500 ml  of water) was introduced into the colon in a retrograde fashion and  refluxed from the rectum to the cecum. Spot images of the colon  followed by overhead radiographs were obtained.  Fluoroscopy time: 1.3 minutes.  Comparison: Two-view abdomen earlier same date. Abdominal pelvic  CT 04/09/2012 and 12/22/2011.  Findings: Today's abdominal radiographs demonstrate a large amount  of stool throughout the proximal to mid colon. However, the distal  colon appears adequately decompressed to evaluate for the presence  of a suspected fistula between the distal small bowel and the  sigmoid colon as seen on prior CT.  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 Fluoroscopically, there is limited retrograde  filling of the descending colon due to the presence of a large  amount of colonic stool. There is also limited opacification of  the right colon due to stool. The transverse colon is completely  opacified on the overhead radiographs, although mucosal assessment  is limited. No extravasation is seen.  IMPRESSION:  1. 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 There is  rapid retrograde filling of the right colon  2. No extravasation.  3. Limited mucosal assessment due to large amount of stool  throughout the ascending, transverse and descending colon.   --Patient continues to feel okay with little pain.  Passing some liquid stool.  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  Still with stool burden in areas of colon  that are technically partially bypassed by fistula.  For now will defer plans to GSU team regarding how to proceed.  He remains on abx and is very stable at present. He will need additional Crohn's maintenance therapy, but would hold pre-operatively as to no further impair wound healing

## 2012-04-13 NOTE — Progress Notes (Signed)
     [facility] Daily Rounding Note 04/13/2012, 11:34 AM  SUBJECTIVE:       Large soft BM overnight, 2 more smaller BMs this AM, one of these formed.  Completed GG BE within the last hour.  No reading yet.  No belly pain.  Tolerating clears  OBJECTIVE:        General: looks well     Vital signs in last 24 hours:    Temp:  [97.6 F (36.4 C)-98 F (36.7 C)] 97.9 F (36.6 C) (06/04 0951) Pulse Rate:  [48-76] 60  (06/04 0951) Resp:  [16-22] 22  (06/04 0951) BP: (113-131)/(56-81) 119/81 mmHg (06/04 0951) SpO2:  [93 %-100 %] 100 % (06/04 0951) Last BM Date: 04/12/12  Heart: RRR Chest: clear B. Abdomen: hypoactive BS, NT.  Palpable mass in RLQ.  Not distended  Extremities: no pedal edema Neuro/Psych:  Pleasant, relaxed, not confused  Intake/Output from previous day: 06/03 0701 - 06/04 0700 In: 3427.7 [I.V.:2625.7; IV Piggyback:226; TPN:576] Out: 1550 [Urine:1550]  Intake/Output this shift:    Lab Results:  [facility] 04/12/12 0526  WBC 4.7  HGB 9.5*  HCT 29.4*  PLT 325   BMET  Basename 04/13/12 0555 04/12/12 0526  NA 140 140  K 3.7 4.3  CL 108 106  CO2 26 27  GLUCOSE 130* 96  BUN 4* 4*  CREATININE 0.80 1.05  CALCIUM 8.6 8.6   LFT  Basename 04/12/12 0526  PROT 5.7*  ALBUMIN 2.5*  AST 20  ALT 11  ALKPHOS 49  BILITOT 0.3  BILIDIR --  IBILI --   Studies/Results: Dg Abd 2 Views 04/13/2012  *RADIOLOGY REPORT*  Clinical Data: Abdominal pain, history of Crohn's disease  ABDOMEN - 2 VIEW  Comparison: CT abdomen pelvis of 04/09/2012  Findings: There is a moderately large amount of feces throughout the entire colon.  No bowel obstruction is seen.  There are a few air-fluid levels within nondistended small bowel loops.  On the erect view no free intraperitoneal air is seen.  Surgical clips are present around the GE junction.  IMPRESSION: Moderately large amount of feces throughout the colon.  Original Report Authenticated By: Juline Patch, M.D.    ASSESMENT: * RLQ  inflammatory mass with small abcess,/contained perf. ? Ileosigmoid fistula.  On Zosyn.  * Altered colon anatomy due to remote surgeries for Crohn's disease.  * Normocytic anemia. Borderline low normal MCV. On po Iron at home.  * hx PAF. No coumadin etc in place PTA    PLAN: *  Await reading of GG enema    LOS: 4 days   [name]  04/13/2012, 11:34 AM Pager: [phone]    Addendum:    GG study shows: 1. 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 There is  rapid retrograde filling of the right colon  2. No extravasation.  3. Limited mucosal assessment due to large amount of stool  throughout the ascending, transverse and descending colon.

## 2012-04-13 NOTE — Progress Notes (Addendum)
  Subjective: Feels well today.  Objective: Vital signs in last 24 hours: Temp:  [97.6 F (36.4 C)-98 F (36.7 C)] 97.6 F (36.4 C) April 22, 2023 0536) Pulse Rate:  [48-76] 53  Apr 22, 2023 0536) Resp:  [16-18] 16  04-22-2023 0536) BP: (113-131)/(56-79) 113/56 mmHg 04/22/2023 0536) SpO2:  [93 %-100 %] 100 % Apr 22, 2023 0536) Last BM Date: 04/12/12  Intake/Output from previous day: 06/03 0701 - 22-Apr-2023 0700 In: 3427.7 [I.V.:2625.7; IV Piggyback:226; TPN:576] Out: 1550 [Urine:1550] Intake/Output this shift:    GI: soft, slightly distended, mild tenderness in llq, no report of nausea or emisis, reports 1 liquid stool this am. Lab Results:   [facility] 04/12/12 0526  WBC 4.7  HGB 9.5*  HCT 29.4*  PLT 325   BMET  Basename 2012-04-21 0555 04/12/12 0526  NA 140 140  K 3.7 4.3  CL 108 106  CO2 26 27  GLUCOSE 130* 96  BUN 4* 4*  CREATININE 0.80 1.05  CALCIUM 8.6 8.6   PT/INR No results found for this basename: LABPROT:2,INR:2 in the last 72 hours ABG No results found for this basename: PHART:2,PCO2:2,PO2:2,HCO3:2 in the last 72 hours  Studies/Results: Dg Abd 2 Views  04-21-12  *RADIOLOGY REPORT*  Clinical Data: Abdominal pain, history of Crohn's disease  ABDOMEN - 2 VIEW  Comparison: CT abdomen pelvis of 04/09/2012  Findings: There is a moderately large amount of feces throughout the entire colon.  No bowel obstruction is seen.  There are a few air-fluid levels within nondistended small bowel loops.  On the erect view no free intraperitoneal air is seen.  Surgical clips are present around the GE junction.  IMPRESSION: Moderately large amount of feces throughout the colon.  Original Report Authenticated By: Juline Patch, M.D.    Anti-infectives: Anti-infectives     Start     Dose/Rate Route Frequency Ordered Stop   04/10/12 0000   piperacillin-tazobactam (ZOSYN) IVPB 3.375 g        3.375 g 12.5 mL/hr over 240 Minutes Intravenous 3 times per day 04/09/12 2345     04/09/12 2115    piperacillin-tazobactam (ZOSYN) IVPB 3.375 g        3.375 g 100 mL/hr over 30 Minutes Intravenous  Once 04/09/12 2114 04/09/12 2239          Assessment/Plan: 1, Crohn's disease 2. Moderate amount of fecal material remains in colon, in spite of repeated dosing of laxiatives.  Plan:  1. Water soluable gastrographin enema today. 2. Continue antibiotics and TNA and clear liquids.   Blenda Mounts 04/21/12  Angelia Mould. Derrell Lolling, M.D., [facility] General and Minimally invasive Surgery Breast and Colorectal Surgery Office:   [phone] Pager:   [phone]

## 2012-04-13 NOTE — Progress Notes (Signed)
PARENTERAL NUTRITION CONSULT NOTE - INITIAL  Pharmacy Consult for TPN Indication: protein cal malnutrition, weight loss, upcoming surgery for Crohn's disease  No Known Allergies  Patient Measurements: Height: 5\' 9"  (175.3 cm) Weight: 146 lb 3.2 oz (66.316 kg) IBW/kg (Calculated) : 70.7  Usual Weight: 20 lb weight loss over the last 6 months noted.  Vital Signs: Temp: 97.9 F (36.6 C) (06/04 0951) Temp src: Oral (06/04 0951) BP: 119/81 mmHg (06/04 0951) Pulse Rate: 60  (06/04 0951) Intake/Output from previous day: 06/03 0701 - 06/04 0700 In: 3427.7 [I.V.:2625.7; IV Piggyback:226; TPN:576] Out: 1550 [Urine:1550] Intake/Output from this shift: Total I/O In: 360 [P.O.:360] Out: -  Labs:  [facility] 04/12/12 0526  WBC 4.7  HGB 9.5*  HCT 29.4*  PLT 325  APTT --  INR --    Basename 04/13/12 0555 04/12/12 0526 04/11/12 0855  NA 140 140 --  K 3.7 4.3 --  CL 108 106 --  CO2 26 27 --  GLUCOSE 130* 96 --  BUN 4* 4* --  CREATININE 0.80 1.05 --  LABCREA -- -- --  CREAT24HRUR -- -- --  CALCIUM 8.6 8.6 --  MG 2.1 2.2 2.2  PHOS 2.9 2.9 2.7  PROT -- 5.7* --  ALBUMIN -- 2.5* --  AST -- 20 --  ALT -- 11 --  ALKPHOS -- 49 --  BILITOT -- 0.3 --  BILIDIR -- -- --  IBILI -- -- --  PREALBUMIN -- -- --  TRIG -- 106 --  CHOLHDL -- -- --  CHOL -- 94 --   Estimated Creatinine Clearance: 78.3 ml/min (by C-G formula based on Cr of 0.8).    Basename 04/13/12 1146 04/13/12 0618 04/12/12 2347  GLUCAP 106* 123* 131*    Medical History: Past Medical History  Diagnosis Date  . Hyperlipidemia   . PAF (paroxysmal atrial fibrillation)   . Aortic stenosis, mild   . Crohn's disease   . PAC (premature atrial contraction)   . Palpitations   . Macular degeneration   . Cataract   . Colonic hemorrhage 1964    Medications:  Scheduled:     . antiseptic oral rinse  15 mL Mouth Rinse q12n4p  . chlorhexidine  15 mL Mouth Rinse BID  . digoxin  0.125 mg Intravenous Daily  .  enoxaparin  40 mg Subcutaneous Q24H  . insulin aspart  0-9 Units Subcutaneous Q6H  . metoprolol tartrate  25 mg Oral BID  . pantoprazole (PROTONIX) IV  40 mg Intravenous QHS  . piperacillin-tazobactam (ZOSYN)  IV  3.375 g Intravenous Q8H  . polyethylene glycol  17 g Oral BID  . DISCONTD: insulin aspart  0-9 Units Subcutaneous Q6H  . DISCONTD: metoprolol  10 mg Intravenous Q6H   Infusions:     . dextrose 5 % and 0.45 % NaCl with KCl 10 mEq/L 100 mL/hr at 04/12/12 1750  . dextrose 5 % and 0.45 % NaCl with KCl 10 mEq/L 60 mL/hr at 04/12/12 2354  . TPN (CLINIMIX) +/- additives 40 mL/hr at 04/12/12 1758   And  . fat emulsion 250 mL (04/12/12 1757)   Insulin Requirements in the past 24 hours:  2 units SSI  Current Nutrition:  Clear liquid diet + TPN  Nutritional Goals:  1850-2100 kCal, 85-105 grams of protein per day  Assessment: 71 yo M w/ 35 year h/o Crohn's disease, admitted following 5 month h/o of intermittent abdominal pain.  H/o multiple GI surgeries in the 1950s/1960s including colon and SB surgery due  to Crohn's.  Last treatment of Crohn's in Nov 2012 for hematochezia.   GI BM overnight, no N/V.  Awaiting GI advice re: possible colonoscopy to define anatomy for sugery, other management of Crohn's dz.  Trying Miralax and enema to cleanse bowel for now.  Likely will need exp lap this admission.  Endo No h/o DM. Glucose ok on BMP  Lytes WNL, [id number] at 60 ml/hr  Renal SCr 0.8, UOP recorded as 1 ml/kg/hr  Hepatobil LFTs WNL.  TG 106, prealbumin 11.3  Pulm/Neuro RA  Cardiology BP well controlled, low HR's - lopressor changed to home dose of 25mg  BID PO  ID Afeb, WBC 4.7,  Empiric abx for abdominal abscess/phelgmon  Zosyn: 6/1>>   Px MC, PTX IV, SQ lovenox for dvt pxl   Plan:  - Increase Clinimix E5/20 to goal rate of 80 ml/hr (provides weekly average of 1895 kcal and 96 g protein per day) - MVI, TE, lipids at 10 ml/hr MWF only 2/2 national shortage - Decrease IVF to [facility]  when new TPN bag is started tonight and change to NS to minimize extraneous dextrose administration - f/u am BMP, Mag, Phos and CBGs - F/u GI plans  Lysle Pearl, PharmD, BCPS Pager # [phone] 04/13/2012 12:08 PM

## 2012-04-14 ENCOUNTER — Other Ambulatory Visit: Payer: Self-pay | Admitting: Urology

## 2012-04-14 LAB — BASIC METABOLIC PANEL
BUN: 9 mg/dL (ref 6–23)
Chloride: 103 mEq/L (ref 96–112)
GFR calc non Af Amer: 88 mL/min — ABNORMAL LOW (ref >90)
Glucose, Bld: 179 mg/dL — ABNORMAL HIGH (ref 70–99)
Potassium: 4.5 mEq/L (ref 3.5–5.1)
Sodium: 134 mEq/L — ABNORMAL LOW (ref 135–145)

## 2012-04-14 LAB — GLUCOSE, CAPILLARY
Glucose-Capillary: 113 mg/dL — ABNORMAL HIGH (ref 70–99)
Glucose-Capillary: 116 mg/dL — ABNORMAL HIGH (ref 70–99)
Glucose-Capillary: 117 mg/dL — ABNORMAL HIGH (ref 70–99)

## 2012-04-14 LAB — PHOSPHORUS: Phosphorus: 3.3 mg/dL (ref 2.3–4.6)

## 2012-04-14 MED ORDER — ZINC TRACE METAL 1 MG/ML IV SOLN
INTRAVENOUS | Status: AC
  Administered 2012-04-14: 18:00:00 via INTRAVENOUS
  Filled 2012-04-14: qty 2000

## 2012-04-14 MED ORDER — FAT EMULSION 20 % IV EMUL
240.0000 mL | INTRAVENOUS | Status: AC
  Administered 2012-04-14: 240 mL via INTRAVENOUS
  Filled 2012-04-14: qty 250

## 2012-04-14 NOTE — Progress Notes (Signed)
PARENTERAL NUTRITION CONSULT NOTE - INITIAL  Pharmacy Consult for TPN Indication: protein cal malnutrition, weight loss, upcoming surgery for Crohn's disease  No Known Allergies  Patient Measurements: Height: 5\' 9"  (175.3 cm) Weight: 146 lb 3.2 oz (66.316 kg) IBW/kg (Calculated) : 70.7  Usual Weight: 20 lb weight loss over the last 6 months noted.  Vital Signs: Temp: 97.5 F (36.4 C) (06/05 1319) Temp src: Oral (06/05 0509) BP: 133/72 mmHg (06/05 1319) Pulse Rate: 56  (06/05 1319) Intake/Output from previous day: 06/04 0701 - 06/05 0700 In: 1983 [P.O.:360; I.V.:684.3; IV Piggyback:50; TPN:888.7] Out: 1029 [Urine:1025; Stool:4] Intake/Output from this shift: Total I/O In: 600 [P.O.:600] Out: 3 [Stool:3] Labs:  [facility] 04/12/12 0526  WBC 4.7  HGB 9.5*  HCT 29.4*  PLT 325  APTT --  INR --    Basename 04/14/12 1252 04/13/12 0555 04/12/12 0526  NA 134* 140 140  K 4.5 3.7 4.3  CL 103 108 106  CO2 23 26 27   GLUCOSE 179* 130* 96  BUN 9 4* 4*  CREATININE 0.79 0.80 1.05  LABCREA -- -- --  CREAT24HRUR -- -- --  CALCIUM 8.9 8.6 8.6  MG 2.2 2.1 2.2  PHOS 3.3 2.9 2.9  PROT -- -- 5.7*  ALBUMIN -- -- 2.5*  AST -- -- 20  ALT -- -- 11  ALKPHOS -- -- 49  BILITOT -- -- 0.3  BILIDIR -- -- --  IBILI -- -- --  PREALBUMIN -- -- --  TRIG -- -- 106  CHOLHDL -- -- --  CHOL -- -- 94   Estimated Creatinine Clearance: 78.3 ml/min (by C-G formula based on Cr of 0.79).    Basename 04/14/12 1205 04/14/12 0503 04/13/12 2332  GLUCAP 116* 125* 125*    Medical History: Past Medical History  Diagnosis Date  . Hyperlipidemia   . PAF (paroxysmal atrial fibrillation)   . Aortic stenosis, mild   . Crohn's disease   . PAC (premature atrial contraction)   . Palpitations   . Macular degeneration   . Cataract   . Colonic hemorrhage 1964    Medications:  Scheduled:     . antiseptic oral rinse  15 mL Mouth Rinse q12n4p  . chlorhexidine  15 mL Mouth Rinse BID  . digoxin   0.125 mg Intravenous Daily  . enoxaparin  40 mg Subcutaneous Q24H  . insulin aspart  0-9 Units Subcutaneous Q6H  . metoprolol tartrate  25 mg Oral BID  . pantoprazole (PROTONIX) IV  40 mg Intravenous QHS  . piperacillin-tazobactam (ZOSYN)  IV  3.375 g Intravenous Q8H  . polyethylene glycol  17 g Oral BID   Infusions:     . sodium chloride 1,000 mL (04/13/12 1738)  . dextrose 5 % and 0.45 % NaCl with KCl 10 mEq/L 60 mL/hr at 04/13/12 1449  . TPN (CLINIMIX) +/- additives 40 mL/hr at 04/13/12 1449   And  . fat emulsion 250 mL (04/13/12 1449)  . TPN (CLINIMIX) +/- additives 80 mL/hr at 04/13/12 1750   Insulin Requirements in the past 24 hours:  2 units SSI,  No insulin in TPN BAG  Current Nutrition:  Clear liquid diet + TPN at 37ml/hr (at goal)  Nutritional Goals:  1850-2100 kCal, 85-105 grams of protein per day  Assessment: 71 yo M w/ 33 year h/o Crohn's disease, admitted following 5 month h/o of intermittent abdominal pain.  H/o multiple GI surgeries in the 1950s/1960s including colon and SB surgery due to Crohn's.  Last treatment of  Crohn's in Nov 2012 for hematochezia.   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 Tentative surgery planned 6/7 for bilateral ureteral stents and exp lap with resections as needed/end-ileostomy w/ subtotal colectomy. Trying Miralax and enema to cleanse bowel for now.   BM overnight, no N/V.   Endo No h/o DM. Glucose at goal <150 with SSI  Lytes Na 134, other lytes WNL  Renal SCr stable, UOP decreased to 0.6 ml/kg/hr, I/O:+950cc  Hepatobil LFTs WNL.  TG 106, prealbumin 11.3  Pulm/Neuro RA  Cardiology Hx PAF - no coumadin PTA. BP well controlled, low HR's despite only 1 dose lopressor 10mg  IV in last 4 days.  Changed to home 25mg  BID PO.   ID Afeb, WBC 4.7 (no new CBC since 6/3),  Empiric abx for abdominal abscess/phelgmon.  No cultures.   Zosyn: 6/1>>   Px MC, PTX IV, SQ lovenox for dvt pxl   Plan:  -  Continue Clinimix E5/20 at goal rate of 80 ml/hr (provides weekly average of 1895 kcal and 96 g protein per day) - MVI, TE, lipids at 10 ml/hr MWF only 2/2 national shortage - F/u am BMP, Mag, Phos and CBGs - F/u GI plans  [name], PharmD 04/14/2012 1:58 PM  Pager: [phone]

## 2012-04-14 NOTE — Progress Notes (Signed)
I have reviewed the patient's clinical course with Dr. Rhea Belton. Both Dr. Rhea Belton and I feel that there is nothing further to do medically until his surgical problems have resolved.  I have tentatively scheduled him for surgery this coming Friday. We will plan to have the urologists put in bilateral ureteral stents and then proceed with exploratory laparotomy, resection of his small bowel Crohn's disease, Brooke ileostomy, and subtotal colectomy. At some point in the future we can close his ileostomy, but I think that that is too problematic at this time with massive stool in an unprepped colon.  I discussed this in detail with the patient. He agrees with this plan. We'll continue on clear liquids and antibiotics and further discuss tomorrow with plans to go ahead with surgery on Friday, June 7.   Angelia Mould. Derrell Lolling, M.D., [facility] General and Minimally invasive Surgery Breast and Colorectal Surgery Office:   [phone] Pager:   [phone]

## 2012-04-14 NOTE — Progress Notes (Signed)
Subjective: Feels good, no new complaints; awaits conversation with Dr. Derrell Lolling concerning possible surgical interventions.  Objective: Vital signs in last 24 hours: Temp:  [97.8 F (36.6 C)] 97.8 F (36.6 C) (06/05 0509) Pulse Rate:  [48-60] 60  (06/05 1055) Resp:  [16-19] 16  (06/05 0509) BP: (131-138)/(63-78) 135/63 mmHg (06/05 0509) SpO2:  [100 %] 100 % (06/05 0509) Last BM Date: 04-23-12  Intake/Output from previous day: 04-24-23 0701 - 06/05 0700 In: 1983 [P.O.:360; I.V.:684.3; IV Piggyback:50; TPN:888.7] Out: 1028 [Urine:1025; Stool:3] Intake/Output this shift: Total I/O In: 360 [P.O.:360] Out: -   General appearance: alert, cooperative and no distress GI: soft, slightly distended, mild tenderness in llq, no report of nausea or emisis, reports 1 small liquid stool this am.    Lab Results:   Basename 04/12/12 0526  WBC 4.7  HGB 9.5*  HCT 29.4*  PLT 325   BMET  Basename 04-23-12 0555 04/12/12 0526  NA 140 140  K 3.7 4.3  CL 108 106  CO2 26 27  GLUCOSE 130* 96  BUN 4* 4*  CREATININE 0.80 1.05  CALCIUM 8.6 8.6   PT/INR No results found for this basename: LABPROT:2,INR:2 in the last 72 hours ABG No results found for this basename: PHART:2,PCO2:2,PO2:2,HCO3:2 in the last 72 hours  Studies/Results: Dg Abd 2 Views  04-23-12  *RADIOLOGY REPORT*  Clinical Data: Abdominal pain, history of Crohn's disease  ABDOMEN - 2 VIEW  Comparison: CT abdomen pelvis of 04/09/2012  Findings: There is a moderately large amount of feces throughout the entire colon.  No bowel obstruction is seen.  There are a few air-fluid levels within nondistended small bowel loops.  On the erect view no free intraperitoneal air is seen.  Surgical clips are present around the GE junction.  IMPRESSION: Moderately large amount of feces throughout the colon.  Original Report Authenticated By: Juline Patch, M.D.   Dg Colon W/water Sol Cm  April 23, 2012  *RADIOLOGY REPORT*  Clinical Data: Crohn's  disease with suspected enterocolonic fistula.  SINGLE COLUMN BARIUM ENEMA WITH WATER SOLUBLE CONTRAST  Technique:  Water soluble contrast (450 ml Omnipaque-300 in 1500 ml of water) was introduced into the colon in a retrograde fashion and refluxed from the rectum to the cecum. Spot images of the colon followed by overhead radiographs were obtained.  Fluoroscopy time: 1.3 minutes.  Comparison:  Two-view abdomen earlier same date.  Abdominal pelvic CT 04/09/2012 and 12/22/2011.  Findings:  Today's abdominal radiographs demonstrate a large amount of stool throughout the proximal to mid colon.  However, the distal colon appears adequately decompressed to evaluate for the presence of a suspected fistula between the distal small bowel and the sigmoid colon as seen on prior CT.  On retrograde filling of the rectum and sigmoid colon, there is rapid opacification of distal small bowel loops and the right colon consistent with a fistula between the sigmoid colon and the terminal ileum.  Fluoroscopically, there is limited retrograde filling of the descending colon due to the presence of a large amount of colonic stool.  There is also limited opacification of the right colon due to stool.  The transverse colon is completely opacified on the overhead radiographs, although mucosal assessment is limited.  No extravasation is seen.  IMPRESSION:  1.  Limited water soluble contrast study confirms the presence of a fistula between the terminal ileum and the sigmoid colon.  There is rapid retrograde filling of the right colon 2.  No extravasation. 3.  Limited mucosal assessment due  to large amount of stool throughout the ascending, transverse and descending colon.  Original Report Authenticated By: Gerrianne Scale, M.D.    Anti-infectives: Anti-infectives     Start     Dose/Rate Route Frequency Ordered Stop   04/10/12 0000   piperacillin-tazobactam (ZOSYN) IVPB 3.375 g        3.375 g 12.5 mL/hr over 240 Minutes Intravenous 3  times per day 04/09/12 2345     04/09/12 2115   piperacillin-tazobactam (ZOSYN) IVPB 3.375 g        3.375 g 100 mL/hr over 30 Minutes Intravenous  Once 04/09/12 2114 04/09/12 2239          Assessment/Plan: s/p * No surgery found * 1, Crohn's disease   Plan:  1. Continue antibiotics and TNA and clear liquids.  2. Continue with ambulation in hallways, Incentive spirometry.     LOS: 5 days    Nicholas [name] 04/14/2012

## 2012-04-14 NOTE — Progress Notes (Signed)
Nicholas [name] Daily Rounding Note 04/14/2012, 11:20 AM  SUBJECTIVE:       Feels well.  Very little abdominal discomfort.  No need of anelgesics.  Small soft stool this AM, stools yesterday.    OBJECTIVE:        General: looks well     Vital signs in last 24 hours:    Temp:  [97.8 F (36.6 C)] 97.8 F (36.6 C) (06/05 0509) Pulse Rate:  [48-60] 60  (06/05 1055) Resp:  [16-19] 16  (06/05 0509) BP: (131-138)/(63-78) 135/63 mmHg (06/05 0509) SpO2:  [100 %] 100 % (06/05 0509) Last BM Date: 04/13/12  Heart: RRR, occas extra beats Chest: clear.  Not dyspneic Abdomen: soft, ND, NT, mass in RLQ.  Active BS  Extremities: no pedal edema Neuro/Psych:  Fully oriented, relaxed and appropriate.  In good spirits.  Intake/Output from previous day: 06/04 0701 - 06/05 0700 In: 1983 [P.O.:360; I.V.:684.3; IV Piggyback:50; TPN:888.7] Out: 1028 [Urine:1025; Stool:3]  Intake/Output this shift: Total I/O In: 360 [P.O.:360] Out: -   Lab Results:  [facility] 04/12/12 0526  WBC 4.7  HGB 9.5*  HCT 29.4*  PLT 325   BMET  Basename 04/13/12 0555 04/12/12 0526  NA 140 140  K 3.7 4.3  CL 108 106  CO2 26 27  GLUCOSE 130* 96  BUN 4* 4*  CREATININE 0.80 1.05  CALCIUM 8.6 8.6   LFT  Basename 04/12/12 0526  PROT 5.7*  ALBUMIN 2.5*  AST 20  ALT 11  ALKPHOS 49  BILITOT 0.3  BILIDIR --  IBILI --   PT/INR No results found for this basename: LABPROT:2,INR:2 in the last 72 hours Hepatitis Panel No results found for this basename: HEPBSAG,HCVAB,HEPAIGM,HEPBIGM in the last 72 hours  Studies/Results:  Dg Colon W/water Sol Cm 04/13/2012  *RADIOLOGY REPORT*  Clinical Data: Crohn's disease with suspected enterocolonic fistula.  SINGLE COLUMN BARIUM ENEMA WITH WATER SOLUBLE CONTRAST  Technique:  Water soluble contrast (450 ml Omnipaque-300 in 1500 ml of water) was introduced into the colon in a retrograde fashion and refluxed from the rectum to the cecum. Spot images of the colon followed by  overhead radiographs were obtained.  Fluoroscopy time: 1.3 minutes.  Comparison:  Two-view abdomen earlier same date.  Abdominal pelvic CT 04/09/2012 and 12/22/2011.  Findings:  Today's abdominal radiographs demonstrate a large amount of stool throughout the proximal to mid colon.  However, the distal colon appears adequately decompressed to evaluate for the presence of a suspected fistula between the distal small bowel and the sigmoid colon as seen on prior CT.  On retrograde filling of the rectum and sigmoid colon, there is rapid opacification of distal small bowel loops and the right colon consistent with a fistula between the sigmoid colon and the terminal ileum.  Fluoroscopically, there is limited retrograde filling of the descending colon due to the presence of a large amount of colonic stool.  There is also limited opacification of the right colon due to stool.  The transverse colon is completely opacified on the overhead radiographs, although mucosal assessment is limited.  No extravasation is seen.  IMPRESSION:  1.  Limited water soluble contrast study confirms the presence of a fistula between the terminal ileum and the sigmoid colon.  There is rapid retrograde filling of the right colon 2.  No extravasation. 3.  Limited mucosal assessment due to large amount of stool throughout the ascending, transverse and descending colon.  Original Report Authenticated By: Gerrianne Scale, M.D.  ASSESMENT: * RLQ inflammatory mass with small abcess,/contained perf.  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   On Zosyn.  * Altered colon anatomy due to remote surgeries for Crohn's disease.  Post surgical anatomy not clarified by BE, due to presence of stool. * Normocytic anemia. Borderline low normal MCV. On po Iron at home.  * hx PAF. No coumadin etc in place PTA    PLAN: *  Await surgical opinion.     LOS: 5 days   Nicholas [name]  04/14/2012, 11:20 AM Pager: [phone]

## 2012-04-14 NOTE — Progress Notes (Signed)
Patient seen, examined, and I agree with the above documentation, including the assessment and plan. Per Dr. Derrell Lolling to the OR, perhaps tomorrow, likely for end-ileostomy with subtotal colectomy.   Will await path to better understand burden of active IBD. I have spoken to [employer] by phone who will assume his IBD management at [facility] after surgery and d/c from the hospital. No further Crohn's therapy at present. Call with questions

## 2012-04-14 NOTE — Progress Notes (Signed)
Nutrition Follow-up  Diet Order:  Clear liquids  Pt reports he is eating at meal times and tolerating clear liquids well.  Continues to deny appetite stating 'it's been a while since I was hungry at a meal.'  Pt continues Clinimix 5/20 @ 80 mL/hr with 20% IV lipids MWF due to national shortage which provides 1895 kcal, 96g protein which meets 100% pt estimated needs.  Discussed wt goals and why wt loss is significant to pt's current status and health maintenance in the future.   Pt states he is anticipating surgery tomorrow or Friday. RD will continue to follow pt and assist if needed with diet advancement post-op and transition from parenteral nutrition. Pt states food preferences which will be entered into foodservice system ([employer]).  Meds: Scheduled Meds:   . antiseptic oral rinse  15 mL Mouth Rinse q12n4p  . chlorhexidine  15 mL Mouth Rinse BID  . digoxin  0.125 mg Intravenous Daily  . enoxaparin  40 mg Subcutaneous Q24H  . insulin aspart  0-9 Units Subcutaneous Q6H  . metoprolol tartrate  25 mg Oral BID  . pantoprazole (PROTONIX) IV  40 mg Intravenous QHS  . piperacillin-tazobactam (ZOSYN)  IV  3.375 g Intravenous Q8H  . polyethylene glycol  17 g Oral BID   Continuous Infusions:   . sodium chloride 20 mL/hr at 04/14/12 1459  . dextrose 5 % and 0.45 % NaCl with KCl 10 mEq/L 60 mL/hr at 04/13/12 1449  . fat emulsion    . TPN (CLINIMIX) +/- additives 40 mL/hr at 04/13/12 1449   And  . fat emulsion 250 mL (04/13/12 1449)  . TPN (CLINIMIX) +/- additives 80 mL/hr at 04/13/12 1750  . TPN (CLINIMIX) +/- additives     PRN Meds:.HYDROmorphone (DILAUDID) injection, ondansetron, sodium chloride  Labs:  CMP     Component Value Date/Time   NA 134* 04/14/2012 1252   K 4.5 04/14/2012 1252   CL 103 04/14/2012 1252   CO2 23 04/14/2012 1252   GLUCOSE 179* 04/14/2012 1252   BUN 9 04/14/2012 1252   CREATININE 0.79 04/14/2012 1252   CALCIUM 8.9 04/14/2012 1252   PROT 5.7* 04/12/2012 0526   ALBUMIN  2.5* 04/12/2012 0526   AST 20 04/12/2012 0526   ALT 11 04/12/2012 0526   ALKPHOS 49 04/12/2012 0526   BILITOT 0.3 04/12/2012 0526   GFRNONAA 88* 04/14/2012 1252   GFRAA >90 04/14/2012 1252     Intake/Output Summary (Last 24 hours) at 04/14/12 1708 Last data filed at 04/14/12 1459  Gross per 24 hour  Intake 2036.33 ml  Output   1029 ml  Net 1007.33 ml    Weight Status:  No new wt  Nutrition Dx:  Inadequate oral intake, ongoing  Intervention:   1.  Parenteral nutrition; continued management per PharmD.  Pt to continue to meet 100% estimated needs with TPN.  Pt states he has noticed a difference in his energy level and overall perception of health since receiving IV nutrition therapy.  Monitor:   1. Food/Beverage; pt tolerating clear liquids and consuming as desired.  Met, continue 2. Parenteral nutrition; pt to meet 90-100% estimated needs with TPN. Met, continue    Hoyt Koch Pager #:  [phone]

## 2012-04-15 LAB — COMPREHENSIVE METABOLIC PANEL
ALT: 7 U/L (ref 0–53)
AST: 12 U/L (ref 0–37)
AST: 14 U/L (ref 0–37)
Albumin: 2.6 g/dL — ABNORMAL LOW (ref 3.5–5.2)
Alkaline Phosphatase: 46 U/L (ref 39–117)
BUN: 10 mg/dL (ref 6–23)
CO2: 26 mEq/L (ref 19–32)
CO2: 27 mEq/L (ref 19–32)
Calcium: 8.7 mg/dL (ref 8.4–10.5)
Calcium: 9.2 mg/dL (ref 8.4–10.5)
Chloride: 106 mEq/L (ref 96–112)
Creatinine, Ser: 0.82 mg/dL (ref 0.50–1.35)
Creatinine, Ser: 0.9 mg/dL (ref 0.50–1.35)
GFR calc Af Amer: >90 mL/min (ref >90)
GFR calc Af Amer: >90 mL/min (ref >90)
GFR calc non Af Amer: 86 mL/min — ABNORMAL LOW (ref >90)
GFR calc non Af Amer: 83 mL/min — ABNORMAL LOW (ref >90)
Glucose, Bld: 109 mg/dL — ABNORMAL HIGH (ref 70–99)
Potassium: 3.8 mEq/L (ref 3.5–5.1)
Sodium: 140 mEq/L (ref 135–145)
Total Bilirubin: 0.2 mg/dL — ABNORMAL LOW (ref 0.3–1.2)
Total Protein: 6 g/dL (ref 6.0–8.3)

## 2012-04-15 LAB — CBC
Hemoglobin: 11 g/dL — ABNORMAL LOW (ref 13.0–17.0)
MCH: 25.8 pg — ABNORMAL LOW (ref 26.0–34.0)
MCHC: 32.3 g/dL (ref 30.0–36.0)
Platelets: 438 10*3/uL — ABNORMAL HIGH (ref 150–400)
RBC: 4.26 MIL/uL (ref 4.22–5.81)
RDW: 16.4 % — ABNORMAL HIGH (ref 11.5–15.5)
WBC: 6.9 10*3/uL (ref 4.0–10.5)

## 2012-04-15 LAB — GLUCOSE, CAPILLARY
Glucose-Capillary: 111 mg/dL — ABNORMAL HIGH (ref 70–99)
Glucose-Capillary: 105 mg/dL — ABNORMAL HIGH (ref 70–99)

## 2012-04-15 LAB — PHOSPHORUS: Phosphorus: 2.9 mg/dL (ref 2.3–4.6)

## 2012-04-15 LAB — ABO/RH: ABO/RH(D): A POS

## 2012-04-15 LAB — MAGNESIUM: Magnesium: 2.1 mg/dL (ref 1.5–2.5)

## 2012-04-15 MED ORDER — LACTATED RINGERS IV SOLN
INTRAVENOUS | Status: DC
  Administered 2012-04-16 (×2): via INTRAVENOUS

## 2012-04-15 MED ORDER — HEPARIN SODIUM (PORCINE) 5000 UNIT/ML IJ SOLN
5000.0000 [IU] | Freq: Once | INTRAMUSCULAR | Status: AC
  Administered 2012-04-16: 5000 [IU] via SUBCUTANEOUS
  Filled 2012-04-15: qty 1

## 2012-04-15 MED ORDER — LACTATED RINGERS IV SOLN: INTRAVENOUS | Status: DC

## 2012-04-15 MED ORDER — FENTANYL CITRATE 0.05 MG/ML IJ SOLN: 50.0000 ug | INTRAMUSCULAR | Status: DC | PRN

## 2012-04-15 MED ORDER — MIDAZOLAM HCL 2 MG/2ML IJ SOLN: 1.0000 mg | INTRAMUSCULAR | Status: DC | PRN

## 2012-04-15 MED ORDER — CHLORHEXIDINE GLUCONATE 4 % EX LIQD
1.0000 "application " | Freq: Once | CUTANEOUS | Status: AC
  Administered 2012-04-16: 1 via TOPICAL
  Filled 2012-04-15: qty 15

## 2012-04-15 MED ORDER — CLINIMIX E/DEXTROSE (5/20) 5 % IV SOLN
INTRAVENOUS | Status: AC
  Administered 2012-04-15: 18:00:00 via INTRAVENOUS
  Filled 2012-04-15: qty 2000

## 2012-04-15 MED ORDER — HEPARIN SODIUM (PORCINE) 5000 UNIT/ML IJ SOLN
5000.0000 [IU] | Freq: Once | INTRAMUSCULAR | Status: DC
  Filled 2012-04-15: qty 1

## 2012-04-15 NOTE — Progress Notes (Signed)
Subjective: Alert and stable. States he feels the best he has in weeks. No pain. No nausea. Lab work pending this morning.  Objective: Vital signs in last 24 hours: Temp:  [97.5 F (36.4 C)-97.6 F (36.4 C)] 97.6 F (36.4 C) (06/06 0554) Pulse Rate:  [48-60] 48  (06/06 0554) Resp:  [16-18] 18  (06/06 0554) BP: (120-133)/(56-72) 120/56 mmHg (06/06 0554) SpO2:  [99 %-100 %] 100 % (06/06 0554) Last BM Date: 04/14/12  Intake/Output from previous day: 06/05 0701 - 06/06 0700 In: 4010 [P.O.:960; I.V.:523; IV Piggyback:150; TPN:2377] Out: 1229 [Urine:1225; Stool:4] Intake/Output this shift:    General appearance: alert. Pleasant. No distress. Mental status normal. Resp: clear to auscultation bilaterally GI: right lower quadrant mass palpable. Much less tender. Perhaps smaller. Lower midline scar. Left upper paramedian scar. Not distended.  Lab Results:  No results found for this basename: WBC:2,HGB:2,HCT:2,PLT:2 in the last 72 hours BMET  [facility] 04/14/12 1252 2012/04/17 0555  NA 134* 140  K 4.5 3.7  CL 103 108  CO2 23 26  GLUCOSE 179* 130*  BUN 9 4*  CREATININE 0.79 0.80  CALCIUM 8.9 8.6   PT/INR No results found for this basename: LABPROT:2,INR:2 in the last 72 hours ABG No results found for this basename: PHART:2,PCO2:2,PO2:2,HCO3:2 in the last 72 hours  Studies/Results: Dg Abd 2 Views  04/17/2012  *RADIOLOGY REPORT*  Clinical Data: Abdominal pain, history of Crohn's disease  ABDOMEN - 2 VIEW  Comparison: CT abdomen pelvis of 04/09/2012  Findings: There is a moderately large amount of feces throughout the entire colon.  No bowel obstruction is seen.  There are a few air-fluid levels within nondistended small bowel loops.  On the erect view no free intraperitoneal air is seen.  Surgical clips are present around the GE junction.  IMPRESSION: Moderately large amount of feces throughout the colon.  Original Report Authenticated By: Juline Patch, M.D.   Dg Colon W/water Sol  Cm  Apr 17, 2012  *RADIOLOGY REPORT*  Clinical Data: Crohn's disease with suspected enterocolonic fistula.  SINGLE COLUMN BARIUM ENEMA WITH WATER SOLUBLE CONTRAST  Technique:  Water soluble contrast (450 ml Omnipaque-300 in 1500 ml of water) was introduced into the colon in a retrograde fashion and refluxed from the rectum to the cecum. Spot images of the colon followed by overhead radiographs were obtained.  Fluoroscopy time: 1.3 minutes.  Comparison:  Two-view abdomen earlier same date.  Abdominal pelvic CT 04/09/2012 and 12/22/2011.  Findings:  Today's abdominal radiographs demonstrate a large amount of stool throughout the proximal to mid colon.  However, the distal colon appears adequately decompressed to evaluate for the presence of a suspected fistula between the distal small bowel and the sigmoid colon as seen on prior CT.  On retrograde filling of the rectum and sigmoid colon, there is rapid opacification of distal small bowel loops and the right colon consistent with a fistula between the sigmoid colon and the terminal ileum.  Fluoroscopically, there is limited retrograde filling of the descending colon due to the presence of a large amount of colonic stool.  There is also limited opacification of the right colon due to stool.  The transverse colon is completely opacified on the overhead radiographs, although mucosal assessment is limited.  No extravasation is seen.  IMPRESSION:  1.  Limited water soluble contrast study confirms the presence of a fistula between the terminal ileum and the sigmoid colon.  There is rapid retrograde filling of the right colon 2.  No extravasation. 3.  Limited  mucosal assessment due to large amount of stool throughout the ascending, transverse and descending colon.  Original Report Authenticated By: Gerrianne Scale, M.D.    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  He has responded well to antibiotics and nutritional support and bowel rest. The patient's condition has been optimized for the most part. He will be to have his Crohn's disease burden resected and a subtotal colectomy would need to be performed.  Plan for surgery tomorrow. Urology will place bilateral ureteral stents. Laparotomy we performed with the intent of resecting the palpable mass of ileal loop  as well as subtotal colectomy and a temporary Brooke ileostomy.  I discussed the indications and details and techniques of the surgery. The numerous risks of this operation were outlined. He understands these issues. All his questions were answered. He agrees with this plan.  Will ask ostomy nurse specialist to mark his ileostomy site preop.  Check CBC and type and screen   LOS: 6 days    Neilson Oehlert M. Derrell Lolling, M.D., [facility] General and Minimally invasive Surgery Breast and Colorectal Surgery Office:   [phone] Pager:   [phone]  04/15/2012

## 2012-04-15 NOTE — Progress Notes (Signed)
PARENTERAL NUTRITION CONSULT NOTE - INITIAL  Pharmacy Consult for TPN Indication: protein cal malnutrition, weight loss, upcoming surgery for Crohn's disease  No Known Allergies  Patient Measurements: Height: 5\' 9"  (175.3 cm) Weight: 146 lb 3.2 oz (66.316 kg) IBW/kg (Calculated) : 70.7  Usual Weight: 20 lb weight loss over the last 6 months noted.  Vital Signs: Temp: 97.6 F (36.4 C) (06/06 0554) Temp src: Oral (06/06 0554) BP: 130/64 mmHg (06/06 1000) Pulse Rate: 66  (06/06 1000) Intake/Output from previous day: 06/05 0701 - 06/06 0700 In: 4010 [P.O.:960; I.V.:523; IV Piggyback:150; TPN:2377] Out: 1229 [Urine:1225; Stool:4] Intake/Output from this shift: Total I/O In: 360 [P.O.:360] Out: -  Labs: No results found for this basename: WBC:3,HGB:3,HCT:3,PLT:3,APTT:3,INR:3 in the last 72 hours  Basename 04/15/12 0614 04/14/12 1252 04/13/12 0555  NA 140 134* 140  K 3.8 4.5 3.7  CL 106 103 108  CO2 26 23 26   GLUCOSE 109* 179* 130*  BUN 10 9 4*  CREATININE 0.82 0.79 0.80  LABCREA -- -- --  CREAT24HRUR -- -- --  CALCIUM 8.7 8.9 8.6  MG 2.1 2.2 2.1  PHOS 2.9 3.3 2.9  PROT 6.0 -- --  ALBUMIN 2.6* -- --  AST 12 -- --  ALT 7 -- --  ALKPHOS 46 -- --  BILITOT 0.2* -- --  BILIDIR -- -- --  IBILI -- -- --  PREALBUMIN -- -- --  TRIG -- -- --  CHOLHDL -- -- --  CHOL -- -- --   Estimated Creatinine Clearance: 76.4 ml/min (by C-G formula based on Cr of 0.82).    Basename 04/15/12 0400 04/14/12 2359 04/14/12 1806  GLUCAP 111* 113* 117*    Medical History: Past Medical History  Diagnosis Date  . Hyperlipidemia   . PAF (paroxysmal atrial fibrillation)   . Aortic stenosis, mild   . Crohn's disease   . PAC (premature atrial contraction)   . Palpitations   . Macular degeneration   . Cataract   . Colonic hemorrhage 1964    Medications:  Scheduled:     . antiseptic oral rinse  15 mL Mouth Rinse q12n4p  . chlorhexidine  15 mL Mouth Rinse BID  . digoxin  0.125 mg  Intravenous Daily  . insulin aspart  0-9 Units Subcutaneous Q6H  . metoprolol tartrate  25 mg Oral BID  . pantoprazole (PROTONIX) IV  40 mg Intravenous QHS  . piperacillin-tazobactam (ZOSYN)  IV  3.375 g Intravenous Q8H  . polyethylene glycol  17 g Oral BID  . DISCONTD: enoxaparin  40 mg Subcutaneous Q24H   Infusions:     . sodium chloride 250 mL (04/14/12 1810)  . fat emulsion 240 mL (04/14/12 1804)  . TPN (CLINIMIX) +/- additives 80 mL/hr at 04/13/12 1750  . TPN (CLINIMIX) +/- additives 80 mL/hr at 04/14/12 1804   Insulin Requirements in the past 24 hours:  0 units SSI,  No insulin in TPN BAG  Current Nutrition:  Clear liquid diet + TPN at 33ml/hr (at goal)  Nutritional Goals:  1850-2100 kCal, 85-105 grams of protein per day  Assessment: 71 yo M w/ 33 year h/o Crohn's disease, admitted following 5 month h/o of intermittent abdominal pain.  H/o multiple GI surgeries in the 1950s/1960s including colon and SB surgery due to Crohn's.  Last treatment of Crohn's in Nov 2012 for hematochezia.   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 Tentative surgery planned 6/7 for bilateral ureteral stents and exp lap with  resections as needed/end-ileostomy w/ subtotal colectomy. Trying Miralax and enema to cleanse bowel for now.   BM overnight, no N/V.  On clear liquids, tolerating well.    Endo No h/o DM. Glucose at goal <150 with SSI  Lytes All lytes WNL  Renal SCr stable, UOP 0.8 ml/kg/hr, I/O: +2.7L  Hepatobil LFTs WNL.  TG 106, prealbumin 11.3  Pulm/Neuro RA  Cardiology Hx PAF - no coumadin PTA. BP well controlled, low HR's on home 25mg  BID PO, also on dig  ID Afeb, WBC 4.7 (no new CBC since 6/3),  Empiric abx for abdominal abscess/phelgmon.  No cultures.   Zosyn: 6/1>>   Px MC, PTX IV, SQ lovenox for dvt pxl   Plan:  - Continue Clinimix E5/20 at goal rate of 80 ml/hr (provides weekly average of 1895 kcal and 96 g protein per day) - MVI,  TE, lipids at 10 ml/hr MWF only 2/2 national shortage - F/u am BMP, and CBGs - F/u surgery 6/7 - F/u LOT for zosyn -Consider d/c of SSI d/t no hx DM and minimal use of SSI  [name], PharmD 04/15/2012 10:38 AM  Pager: [phone]

## 2012-04-15 NOTE — Consult Note (Signed)
Ostomy pre-op note: Stoma site marking done for temporary ileostomy to be placed.  Mark placed 5 cm from midline of abdomen.  13 cm up from waist line.  Folds and creases avoided.  Mark placed within rectus abdominus muscle.  Marking visible to patient in sitting, standing and lying position.  Brief education regarding 2 piece vs. one piece pouches as pouch change.  Family at bedside, familiar with ostomies due to family member having one for 20 years.  Will follow-up on Monday for post-op ostomy teaching.  Pre-op ostomy pouch placed in room for patient to read.  Reynold Bowen RN, BSN, WOC Nurse/ Thank-you [place], Charity fundraiser, MSN, [employer]  [phone]

## 2012-04-15 NOTE — Progress Notes (Signed)
UR complete 

## 2012-04-16 ENCOUNTER — Encounter (HOSPITAL_COMMUNITY): Payer: Self-pay | Admitting: Anesthesiology

## 2012-04-16 ENCOUNTER — Inpatient Hospital Stay (HOSPITAL_COMMUNITY): Payer: Medicare Other

## 2012-04-16 ENCOUNTER — Encounter (HOSPITAL_COMMUNITY): Admission: EM | Disposition: A | Discharge status: Home / Self Care | Payer: Self-pay | Source: Home / Self Care

## 2012-04-16 ENCOUNTER — Inpatient Hospital Stay (HOSPITAL_COMMUNITY): Payer: Medicare Other | Admitting: Anesthesiology

## 2012-04-16 HISTORY — PX: LAPAROTOMY: SHX154

## 2012-04-16 HISTORY — PX: ILEOSTOMY: SHX1783

## 2012-04-16 LAB — BASIC METABOLIC PANEL
BUN: 12 mg/dL (ref 6–23)
BUN: 17 mg/dL (ref 6–23)
CO2: 24 mEq/L (ref 19–32)
Calcium: 8.8 mg/dL (ref 8.4–10.5)
Chloride: 107 mEq/L (ref 96–112)
Chloride: 106 mEq/L (ref 96–112)
Creatinine, Ser: 0.88 mg/dL (ref 0.50–1.35)
Creatinine, Ser: 0.76 mg/dL (ref 0.50–1.35)
GFR calc Af Amer: >90 mL/min (ref >90)
GFR calc non Af Amer: 84 mL/min — ABNORMAL LOW (ref >90)
Glucose, Bld: 200 mg/dL — ABNORMAL HIGH (ref 70–99)
Potassium: 3.7 mEq/L (ref 3.5–5.1)

## 2012-04-16 LAB — SURGICAL PCR SCREEN
MRSA, PCR: NEGATIVE
Staphylococcus aureus: NEGATIVE

## 2012-04-16 LAB — GLUCOSE, CAPILLARY: Glucose-Capillary: 117 mg/dL — ABNORMAL HIGH (ref 70–99)

## 2012-04-16 LAB — CBC
HCT: 27.4 % — ABNORMAL LOW (ref 39.0–52.0)
Hemoglobin: 8.8 g/dL — ABNORMAL LOW (ref 13.0–17.0)
MCV: 80.8 fL (ref 78.0–100.0)
RBC: 3.39 MIL/uL — ABNORMAL LOW (ref 4.22–5.81)
RDW: 16.4 % — ABNORMAL HIGH (ref 11.5–15.5)
WBC: 16.4 10*3/uL — ABNORMAL HIGH (ref 4.0–10.5)

## 2012-04-16 SURGERY — LAPAROTOMY, EXPLORATORY
Anesthesia: General | Site: Ureter | Wound class: Dirty or Infected

## 2012-04-16 MED ORDER — STERILE WATER FOR IRRIGATION IR SOLN
Status: DC | PRN
  Administered 2012-04-16: 3000 mL

## 2012-04-16 MED ORDER — INDIGOTINDISULFONATE SODIUM 8 MG/ML IJ SOLN
INTRAMUSCULAR | Status: DC | PRN
  Administered 2012-04-16 (×2): 40 mg via INTRAVENOUS

## 2012-04-16 MED ORDER — LIDOCAINE HCL 2 % EX GEL
CUTANEOUS | Status: DC | PRN
  Administered 2012-04-16: 1

## 2012-04-16 MED ORDER — PROPOFOL 10 MG/ML IV EMUL
INTRAVENOUS | Status: DC | PRN
  Administered 2012-04-16: 170 mg via INTRAVENOUS

## 2012-04-16 MED ORDER — FAT EMULSION 20 % IV EMUL
240.0000 mL | INTRAVENOUS | Status: AC
  Administered 2012-04-16: 240 mL via INTRAVENOUS
  Filled 2012-04-16: qty 250

## 2012-04-16 MED ORDER — ROCURONIUM BROMIDE 100 MG/10ML IV SOLN
INTRAVENOUS | Status: DC | PRN
  Administered 2012-04-16: 50 mg via INTRAVENOUS

## 2012-04-16 MED ORDER — LACTATED RINGERS IV SOLN
INTRAVENOUS | Status: DC | PRN
  Administered 2012-04-16 (×2): via INTRAVENOUS

## 2012-04-16 MED ORDER — HEPARIN SODIUM (PORCINE) 5000 UNIT/ML IJ SOLN
5000.0000 [IU] | Freq: Three times a day (TID) | INTRAMUSCULAR | Status: DC
  Administered 2012-04-16 – 2012-04-26 (×29): 5000 [IU] via SUBCUTANEOUS
  Filled 2012-04-16 (×34): qty 1

## 2012-04-16 MED ORDER — METOCLOPRAMIDE HCL 5 MG/ML IJ SOLN
INTRAMUSCULAR | Status: DC | PRN
  Administered 2012-04-16: 10 mg via INTRAVENOUS

## 2012-04-16 MED ORDER — ONDANSETRON HCL 4 MG/2ML IJ SOLN: 4.0000 mg | Freq: Four times a day (QID) | INTRAMUSCULAR | Status: DC | PRN

## 2012-04-16 MED ORDER — ONDANSETRON HCL 4 MG/2ML IJ SOLN
4.0000 mg | Freq: Four times a day (QID) | INTRAMUSCULAR | Status: DC | PRN
  Administered 2012-04-20 – 2012-04-22 (×6): 4 mg via INTRAVENOUS
  Filled 2012-04-16 (×6): qty 2

## 2012-04-16 MED ORDER — VECURONIUM BROMIDE 10 MG IV SOLR
INTRAVENOUS | Status: DC | PRN
  Administered 2012-04-16 (×2): 2 mg via INTRAVENOUS

## 2012-04-16 MED ORDER — SODIUM CHLORIDE 0.9 % IJ SOLN: 9.0000 mL | INTRAMUSCULAR | Status: DC | PRN

## 2012-04-16 MED ORDER — EPHEDRINE SULFATE 50 MG/ML IJ SOLN
INTRAMUSCULAR | Status: DC | PRN
  Administered 2012-04-16: 15 mg via INTRAVENOUS

## 2012-04-16 MED ORDER — HETASTARCH-ELECTROLYTES 6 % IV SOLN
INTRAVENOUS | Status: DC | PRN
  Administered 2012-04-16: 14:00:00 via INTRAVENOUS

## 2012-04-16 MED ORDER — GLYCOPYRROLATE 0.2 MG/ML IJ SOLN
INTRAMUSCULAR | Status: DC | PRN
  Administered 2012-04-16: 0.2 mg via INTRAVENOUS
  Administered 2012-04-16: 0.4 mg via INTRAVENOUS

## 2012-04-16 MED ORDER — ONDANSETRON HCL 4 MG PO TABS: 4.0000 mg | ORAL_TABLET | Freq: Four times a day (QID) | ORAL | Status: DC | PRN

## 2012-04-16 MED ORDER — ONDANSETRON HCL 4 MG/2ML IJ SOLN
INTRAMUSCULAR | Status: DC | PRN
  Administered 2012-04-16: 4 mg via INTRAVENOUS

## 2012-04-16 MED ORDER — IOHEXOL 300 MG/ML  SOLN
INTRAMUSCULAR | Status: DC | PRN
  Administered 2012-04-16: 10 mL via INTRAVENOUS

## 2012-04-16 MED ORDER — DROPERIDOL 2.5 MG/ML IJ SOLN
INTRAMUSCULAR | Status: DC | PRN
  Administered 2012-04-16: 0.625 mg via INTRAVENOUS

## 2012-04-16 MED ORDER — FENTANYL CITRATE 0.05 MG/ML IJ SOLN
INTRAMUSCULAR | Status: DC | PRN
  Administered 2012-04-16 (×3): 100 ug via INTRAVENOUS
  Administered 2012-04-16 (×2): 50 ug via INTRAVENOUS
  Administered 2012-04-16 (×3): 100 ug via INTRAVENOUS
  Administered 2012-04-16: 50 ug via INTRAVENOUS

## 2012-04-16 MED ORDER — LACTATED RINGERS IV SOLN
INTRAVENOUS | Status: DC | PRN
  Administered 2012-04-16: 12:00:00 via INTRAVENOUS

## 2012-04-16 MED ORDER — MORPHINE SULFATE (PF) 1 MG/ML IV SOLN
INTRAVENOUS | Status: DC
  Administered 2012-04-16: 23:00:00 via INTRAVENOUS
  Administered 2012-04-17: 21 mg via INTRAVENOUS
  Administered 2012-04-17: 4.5 mg via INTRAVENOUS
  Administered 2012-04-17: 7.5 mg via INTRAVENOUS
  Administered 2012-04-17: 09:00:00 via INTRAVENOUS
  Administered 2012-04-17: 5.23 mg via INTRAVENOUS
  Administered 2012-04-18 (×2): 3 mg via INTRAVENOUS
  Filled 2012-04-16: qty 25

## 2012-04-16 MED ORDER — MORPHINE SULFATE (PF) 1 MG/ML IV SOLN
INTRAVENOUS | Status: AC
  Filled 2012-04-16: qty 25

## 2012-04-16 MED ORDER — M.V.I. PEDIATRIC IV INJ: INJECTION | INTRAVENOUS | Status: DC | PRN

## 2012-04-16 MED ORDER — HYDROMORPHONE HCL PF 1 MG/ML IJ SOLN
0.2500 mg | INTRAMUSCULAR | Status: DC | PRN
  Administered 2012-04-16 (×2): 0.5 mg via INTRAVENOUS

## 2012-04-16 MED ORDER — POTASSIUM CHLORIDE IN NACL 20-0.9 MEQ/L-% IV SOLN
INTRAVENOUS | Status: DC
  Administered 2012-04-16: 20:00:00 via INTRAVENOUS
  Administered 2012-04-17 (×2): 125 mL/h via INTRAVENOUS
  Administered 2012-04-18: 02:00:00 via INTRAVENOUS
  Administered 2012-04-18: 75 mL/h via INTRAVENOUS
  Administered 2012-04-19: 20 mL/h via INTRAVENOUS
  Administered 2012-04-20: 10:00:00 via INTRAVENOUS
  Administered 2012-04-22: 1000 mL via INTRAVENOUS
  Administered 2012-04-24 – 2012-04-26 (×2): via INTRAVENOUS
  Filled 2012-04-16 (×15): qty 1000

## 2012-04-16 MED ORDER — MIDAZOLAM HCL 5 MG/5ML IJ SOLN
INTRAMUSCULAR | Status: DC | PRN
  Administered 2012-04-16: 2 mg via INTRAVENOUS

## 2012-04-16 MED ORDER — ZINC TRACE METAL 1 MG/ML IV SOLN
INTRAVENOUS | Status: AC
  Administered 2012-04-16: 20:00:00 via INTRAVENOUS
  Filled 2012-04-16: qty 2000

## 2012-04-16 MED ORDER — METOPROLOL TARTRATE 1 MG/ML IV SOLN
5.0000 mg | Freq: Four times a day (QID) | INTRAVENOUS | Status: DC
  Administered 2012-04-17 – 2012-04-19 (×10): 5 mg via INTRAVENOUS
  Filled 2012-04-16 (×18): qty 5

## 2012-04-16 MED ORDER — NALOXONE HCL 0.4 MG/ML IJ SOLN: 0.4000 mg | INTRAMUSCULAR | Status: DC | PRN

## 2012-04-16 MED ORDER — PIPERACILLIN-TAZOBACTAM 3.375 G IVPB 30 MIN: INTRAVENOUS | Status: DC | PRN

## 2012-04-16 MED ORDER — DIPHENHYDRAMINE HCL 50 MG/ML IJ SOLN: 12.5000 mg | Freq: Four times a day (QID) | INTRAMUSCULAR | Status: DC | PRN

## 2012-04-16 MED ORDER — DIPHENHYDRAMINE HCL 12.5 MG/5ML PO ELIX
12.5000 mg | ORAL_SOLUTION | Freq: Four times a day (QID) | ORAL | Status: DC | PRN
  Filled 2012-04-16: qty 5

## 2012-04-16 MED ORDER — HYDROMORPHONE HCL PF 1 MG/ML IJ SOLN
INTRAMUSCULAR | Status: AC
  Administered 2012-04-16: 0.5 mg via INTRAVENOUS
  Filled 2012-04-16: qty 1

## 2012-04-16 MED ORDER — 0.9 % SODIUM CHLORIDE (POUR BTL) OPTIME
TOPICAL | Status: DC | PRN
  Administered 2012-04-16 (×8): 1000 mL

## 2012-04-16 SURGICAL SUPPLY — 77 items
ADAPTER GOLDBERG URETERAL (ADAPTER) ×2 IMPLANT
ADPR CATH 15X14FR FL DRN BG (ADAPTER) ×3
BLADE SURG ROTATE 9660 (MISCELLANEOUS) IMPLANT
BRR ADH 5X3 SEPRAFILM 6 SHT (MISCELLANEOUS) ×3
CANISTER SUCTION 2500CC (MISCELLANEOUS) ×4 IMPLANT
CATH FOLEY 2WAY SLVR  5CC 14FR (CATHETERS) ×1
CATH FOLEY 2WAY SLVR 5CC 14FR (CATHETERS) ×1 IMPLANT
CHLORAPREP W/TINT 26ML (MISCELLANEOUS) ×4 IMPLANT
CLOTH BEACON ORANGE TIMEOUT ST (SAFETY) ×4 IMPLANT
COVER SURGICAL LIGHT HANDLE (MISCELLANEOUS) ×4 IMPLANT
DRAIN CHANNEL 19F RND (DRAIN) ×2 IMPLANT
DRAPE C-ARM 42X72 X-RAY (DRAPES) ×2 IMPLANT
DRAPE LAPAROSCOPIC ABDOMINAL (DRAPES) ×4 IMPLANT
DRAPE PROXIMA HALF (DRAPES) ×2 IMPLANT
DRAPE UTILITY 15X26 W/TAPE STR (DRAPE) ×8 IMPLANT
DRAPE WARM FLUID 44X44 (DRAPE) ×4 IMPLANT
ELECT BLADE 4.0 EZ CLEAN MEGAD (MISCELLANEOUS) ×4
ELECT BLADE 6.5 EXT (BLADE) ×2 IMPLANT
ELECT CAUTERY BLADE 6.4 (BLADE) ×4 IMPLANT
ELECT REM PT RETURN 9FT ADLT (ELECTROSURGICAL) ×4
ELECTRODE BLDE 4.0 EZ CLN MEGD (MISCELLANEOUS) ×1 IMPLANT
ELECTRODE REM PT RTRN 9FT ADLT (ELECTROSURGICAL) ×3 IMPLANT
EVACUATOR SILICONE 100CC (DRAIN) ×2 IMPLANT
GLOVE BIO SURGEON STRL SZ 6.5 (GLOVE) ×2 IMPLANT
GLOVE BIO SURGEON STRL SZ7.5 (GLOVE) ×6 IMPLANT
GLOVE BIO SURGEON STRL SZ8 (GLOVE) ×2 IMPLANT
GLOVE BIOGEL M STRL SZ7.5 (GLOVE) ×2 IMPLANT
GLOVE BIOGEL PI IND STRL 6.5 (GLOVE) ×1 IMPLANT
GLOVE BIOGEL PI IND STRL 7.0 (GLOVE) ×3 IMPLANT
GLOVE BIOGEL PI IND STRL 7.5 (GLOVE) ×2 IMPLANT
GLOVE BIOGEL PI IND STRL 8 (GLOVE) ×1 IMPLANT
GLOVE BIOGEL PI INDICATOR 6.5 (GLOVE) ×1
GLOVE BIOGEL PI INDICATOR 7.0 (GLOVE) ×3
GLOVE BIOGEL PI INDICATOR 7.5 (GLOVE) ×2
GLOVE BIOGEL PI INDICATOR 8 (GLOVE) ×1
GLOVE ECLIPSE 6.5 STRL STRAW (GLOVE) ×2 IMPLANT
GLOVE EUDERMIC 7 POWDERFREE (GLOVE) ×8 IMPLANT
GOWN PREVENTION PLUS XLARGE (GOWN DISPOSABLE) ×6 IMPLANT
GOWN STRL NON-REIN LRG LVL3 (GOWN DISPOSABLE) ×18 IMPLANT
GUIDEWIRE ANG ZIPWIRE 038X150 (WIRE) ×2 IMPLANT
GUIDEWIRE STR DUAL SENSOR (WIRE) ×2 IMPLANT
KIT BASIN OR (CUSTOM PROCEDURE TRAY) ×4 IMPLANT
KIT OSTOMY DRAINABLE 2.75 STR (WOUND CARE) ×2 IMPLANT
KIT ROOM TURNOVER OR (KITS) ×4 IMPLANT
LIGASURE IMPACT 36 18CM CVD LR (INSTRUMENTS) ×4 IMPLANT
NS IRRIG 1000ML POUR BTL (IV SOLUTION) ×20 IMPLANT
PACK GENERAL/GYN (CUSTOM PROCEDURE TRAY) ×4 IMPLANT
PAD ARMBOARD 7.5X6 YLW CONV (MISCELLANEOUS) ×4 IMPLANT
RELOAD PROXIMATE 75MM BLUE (ENDOMECHANICALS) ×4 IMPLANT
RELOAD STAPLE 75 3.8 BLU REG (ENDOMECHANICALS) IMPLANT
SEPRAFILM PROCEDURAL PACK 3X5 (MISCELLANEOUS) ×2 IMPLANT
SPECIMEN JAR X LARGE (MISCELLANEOUS) IMPLANT
SPONGE GAUZE 4X4 12PLY (GAUZE/BANDAGES/DRESSINGS) ×4 IMPLANT
SPONGE LAP 18X18 X RAY DECT (DISPOSABLE) ×14 IMPLANT
STAPLER PROXIMATE 75MM BLUE (STAPLE) ×2 IMPLANT
STAPLER VISISTAT 35W (STAPLE) ×4 IMPLANT
SUCTION POOLE TIP (SUCTIONS) ×4 IMPLANT
SUT ETHILON 3 0 FSL (SUTURE) ×2 IMPLANT
SUT NOVA 1 T20/GS 25DT (SUTURE) ×4 IMPLANT
SUT PDS AB 1 TP1 96 (SUTURE) ×8 IMPLANT
SUT PROLENE 2 0 SH DA (SUTURE) ×2 IMPLANT
SUT SILK 2 0 (SUTURE) ×4
SUT SILK 2 0 SH CR/8 (SUTURE) ×4 IMPLANT
SUT SILK 2 0 TIES 10X30 (SUTURE) ×6 IMPLANT
SUT SILK 2-0 18XBRD TIE 12 (SUTURE) ×1 IMPLANT
SUT SILK 3 0 SH CR/8 (SUTURE) ×4 IMPLANT
SUT SILK 3 0 TIES 10X30 (SUTURE) ×4 IMPLANT
SWAB COLLECTION DEVICE MRSA (MISCELLANEOUS) ×2 IMPLANT
SYR 20CC LL (SYRINGE) ×2 IMPLANT
SYRINGE TOOMEY DISP (SYRINGE) ×2 IMPLANT
TAPE CLOTH SURG 4X10 WHT LF (GAUZE/BANDAGES/DRESSINGS) ×2 IMPLANT
TOWEL OR 17X24 6PK STRL BLUE (TOWEL DISPOSABLE) ×4 IMPLANT
TOWEL OR 17X26 10 PK STRL BLUE (TOWEL DISPOSABLE) ×4 IMPLANT
TRAY FOLEY CATH 14FRSI W/METER (CATHETERS) IMPLANT
TUBE ANAEROBIC SPECIMEN COL (MISCELLANEOUS) ×2 IMPLANT
WATER STERILE IRR 1000ML POUR (IV SOLUTION) IMPLANT
YANKAUER SUCT BULB TIP NO VENT (SUCTIONS) ×2 IMPLANT

## 2012-04-16 NOTE — Anesthesia Postprocedure Evaluation (Signed)
Anesthesia Post Note  Patient: Nicholas [name]  Procedure(s) Performed: Procedure(s) (LRB): EXPLORATORY LAPAROTOMY (N/A) ILEOSTOMY (N/A) CYSTOSCOPY WITH RETROGRADE PYELOGRAM, URETEROSCOPY AND STENT PLACEMENT (Bilateral)  Anesthesia type: General  Patient location: PACU  Post pain: Pain level controlled and Adequate analgesia  Post assessment: Post-op Vital signs reviewed, Patient's Cardiovascular Status Stable, Respiratory Function Stable, Patent Airway and Pain level controlled  Last Vitals:  Filed Vitals:   04/16/12 1700  BP: 111/34  Pulse: 62  Temp:   Resp: 19    Post vital signs: Reviewed and stable  Level of consciousness: awake, alert  and oriented  Complications: No apparent anesthesia complications

## 2012-04-16 NOTE — Anesthesia Procedure Notes (Signed)
Procedure Name: Intubation Date/Time: 04/16/2012 12:01 PM Performed by: Wray Kearns A Pre-anesthesia Checklist: Patient identified, Emergency Drugs available, Timeout performed, Suction available and Patient being monitored Patient Re-evaluated:Patient Re-evaluated prior to inductionOxygen Delivery Method: Circle system utilized Preoxygenation: Pre-oxygenation with 100% oxygen Intubation Type: IV induction and Cricoid Pressure applied Ventilation: Mask ventilation without difficulty Laryngoscope Size: Mac and 4 Grade View: Grade II Tube type: Oral Tube size: 7.5 mm Number of attempts: 1 Airway Equipment and Method: Stylet Placement Confirmation: ETT inserted through vocal cords under direct vision,  positive ETCO2 and breath sounds checked- equal and bilateral Secured at: 23 cm Tube secured with: Tape Dental Injury: Teeth and Oropharynx as per pre-operative assessment

## 2012-04-16 NOTE — Preoperative (Signed)
Beta Blockers   Reason not to administer Beta Blockers:Lopressor 12.5MG  PO @1024Hr  on06/05/2012

## 2012-04-16 NOTE — Transfer of Care (Signed)
Immediate Anesthesia Transfer of Care Note  Patient: Nicholas [name]  Procedure(s) Performed: Procedure(s) (LRB): EXPLORATORY LAPAROTOMY (N/A) ILEOSTOMY (N/A) CYSTOSCOPY WITH RETROGRADE PYELOGRAM, URETEROSCOPY AND STENT PLACEMENT (Bilateral)  Patient Location: PACU  Anesthesia Type: General  Level of Consciousness: sedated  Airway & Oxygen Therapy: Patient Spontanous Breathing and Patient connected to nasal cannula oxygen  Post-op Assessment: Report given to PACU RN, Post -op Vital signs reviewed and stable and Patient moving all extremities X 4  Post vital signs: Reviewed and stable  Complications: No apparent anesthesia complications

## 2012-04-16 NOTE — OR Nursing (Signed)
PER DR. INGRAM, BLADDER FILLED WITH OF NACL. FOLEY CLAMPED @ 1335. 1350, ADDITIONAL NACL ADDED TO BLADDER FOR TOTAL OF . FOLEY UNCLAMPED @ 1420.

## 2012-04-16 NOTE — Progress Notes (Signed)
PARENTERAL NUTRITION CONSULT NOTE - Follow Up  Pharmacy Consult for TPN Indication: protein cal malnutrition, weight loss, upcoming surgery for Crohn's disease  No Known Allergies  Patient Measurements: Height: 5\' 9"  (175.3 cm) Weight: 146 lb 3.2 oz (66.316 kg) IBW/kg (Calculated) : 70.7  Usual Weight: 20 lb weight loss over the last 6 months noted.  Vital Signs: Temp: 97.9 F (36.6 C) (06/07 0540) Temp src: Oral (06/07 0540) BP: 124/65 mmHg (06/07 0540) Pulse Rate: 55  (06/07 0540) Intake/Output from previous day: 06/06 0701 - 06/07 0700 In: 3259.3 [P.O.:720; I.V.:477.5; IV Piggyback:110; TPN:1951.8] Out: 1000 [Urine:1000] Intake/Output from this shift:   Labs:  Basename 04/15/12 2200 04/15/12 1145  WBC 6.4 6.9  HGB 11.0* 10.6*  HCT 34.4* 32.8*  PLT 428* 438*  APTT -- --  INR -- --    Basename 04/16/12 0521 04/15/12 2200 04/15/12 0614 04/14/12 1252  NA 140 138 140 --  K 3.9 4.3 3.8 --  CL 107 102 106 --  CO2 26 27 26  --  GLUCOSE 120* 95 109* --  BUN 12 12 10  --  CREATININE 0.88 0.90 0.82 --  LABCREA -- -- -- --  CREAT24HRUR -- -- -- --  CALCIUM 8.8 9.2 8.7 --  MG -- -- 2.1 2.2  PHOS -- -- 2.9 3.3  PROT -- 7.2 6.0 --  ALBUMIN -- 3.3* 2.6* --  AST -- 14 12 --  ALT -- 8 7 --  ALKPHOS -- 55 46 --  BILITOT -- 0.3 0.2* --  BILIDIR -- -- -- --  IBILI -- -- -- --  PREALBUMIN -- -- -- --  TRIG -- -- -- --  CHOLHDL -- -- -- --  CHOL -- -- -- --   Estimated Creatinine Clearance: 71.2 ml/min (by C-G formula based on Cr of 0.88).    Basename 04/16/12 0542 04/15/12 2345 04/15/12 1815  GLUCAP 117* 125* 187*    Medical History: Past Medical History  Diagnosis Date  . Hyperlipidemia   . PAF (paroxysmal atrial fibrillation)   . Aortic stenosis, mild   . Crohn's disease   . PAC (premature atrial contraction)   . Palpitations   . Macular degeneration   . Cataract   . Colonic hemorrhage 1964    Medications:  Scheduled:     . antiseptic oral rinse  15  mL Mouth Rinse q12n4p  . chlorhexidine  1 application Topical Once  . chlorhexidine  15 mL Mouth Rinse BID  . digoxin  0.125 mg Intravenous Daily  . heparin subcutaneous  5,000 Units Subcutaneous Once  . insulin aspart  0-9 Units Subcutaneous Q6H  . metoprolol tartrate  25 mg Oral BID  . pantoprazole (PROTONIX) IV  40 mg Intravenous QHS  . piperacillin-tazobactam (ZOSYN)  IV  3.375 g Intravenous Q8H  . polyethylene glycol  17 g Oral BID  . DISCONTD: heparin  5,000 Units Subcutaneous Once   Infusions:     . sodium chloride 20 mL/hr at 04/15/12 1853  . fat emulsion 240 mL (04/14/12 1804)  . lactated ringers 50 mL/hr at 04/16/12 0608  . TPN (CLINIMIX) +/- additives 80 mL/hr at 04/14/12 1804  . TPN (CLINIMIX) +/- additives 80 mL/hr at 04/15/12 2200  . DISCONTD: lactated ringers     Insulin Requirements in the past 24 hours:  0 units SSI,  No insulin in TPN BAG  Current Nutrition:  Clear liquid diet + TPN at 62ml/hr (at goal)  Nutritional Goals:  1850-2100 kCal, 85-105 grams of protein  per day  Assessment: 71 yo M w/ 33 year h/o Crohn's disease, admitted following 5 month h/o of intermittent abdominal pain.  H/o multiple GI surgeries in the 1950s/1960s including colon and SB surgery due to Crohn's.  Last treatment of Crohn's in Nov 2012 for hematochezia.   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 Tentative surgery planned 6/7 for bilateral ureteral stents and exp lap with resections as needed/end-ileostomy w/ subtotal colectomy. Trying Miralax and enema to cleanse bowel for now.   BM overnight, no N/V.  On clear liquids, tolerating well.    Endo No h/o DM. Glucose at goal <150 with SSI  Lytes All lytes WNL  Renal SCr stable, UOP 0.6 ml/kg/hr, I/O: +2.2L  Hepatobil LFTs WNL.  TG 106, prealbumin 11.3  Pulm/Neuro RA  Cardiology Hx PAF - no coumadin PTA. BP well controlled, low HR's on home 25mg  BID PO, also on dig  ID Afeb, WBC 4.7 (no  new CBC since 6/3),  Empiric abx for abdominal abscess/phelgmon.  No cultures.   Zosyn: 6/1>>   Px MC, PTX IV, SQ lovenox for dvt pxl   Plan:  - Continue Clinimix E5/20 at goal rate of 80 ml/hr (provides weekly average of 1895 kcal and 96 g protein per day) - MVI, TE, lipids at 10 ml/hr MWF only 2/2 national shortage - F/u am BMP, and CBGs - F/u post OR plans with parenteral nutrition needs   Nadara Mustard, PharmD., MS Clinical Pharmacist Pager:  [phone]  Thank you for allowing pharmacy to be part of this patients care team.  04/16/2012 9:45 AM

## 2012-04-16 NOTE — Progress Notes (Signed)
Had a quiet night. No new complaints. Questions answered.  Plan to proceed with Crohn's resection ileostomy, subtotal colectomy today.   Angelia Mould. Derrell Lolling, M.D., [facility] General and Minimally invasive Surgery Breast and Colorectal Surgery Office:   [phone] Pager:   [phone]

## 2012-04-16 NOTE — Op Note (Signed)
Patient Name:           Nicholas [name]   Date of Surgery:        04/16/2012  Pre op Diagnosis:      Crohn's disease with ileocolic fistula, perforation and inflammatory mass  Post op Diagnosis:    Same, with functionally bypassed colon and massive chronic fecal impaction  Procedure:                 Exploratory laparotomy, resection of ileal inflammatory mass, subtotal colectomy with closure of rectal stump and Brooke ileostomy  Surgeon:                     Angelia Mould. Derrell Lolling, M.D., FACS  Assistant:                      Avel Peace, M.D., FACS  Urologist:                       Lorin Picket McDiarmid, MD  (for cysto and ureteral stints)  Operative Indications:   This is a 71 year old gentleman who was admitted one week ago with weight loss abdominal pain and an abdominal mass. He had a past history of vagotomy and pyloroplasty for ulcer disease in 1960s. He has past history of some type of a laparotomy and operation for Crohn's disease in the 1970s which sounds like it involved a fistula to the bladder. He does not know the  the details of that. He was found to have an inflammatory mass involving the distal small bowel with a possible small interloop abscess he is in microperforation. His pain improved on antibiotics after about 5 days. Gastrografin enema showed a fistula between the sigmoid colon and the ileum. The right colon, transverse colon, descending colon and proximal sigmoid colon were massively impacted with stool which appeared to be chronic. The patient was placed on hyperalimentation last week. He is to undergo surgical resection of the ileal inflammatory mass as well as a subtotal colectomy. He is brought to the operating room for that procedure  Operative Findings:       There was a 20 cm wide inflammatory mass involving the terminal ileum. It was densely adherent to the back wall of the bladder, the abdominal wall, the retroperitoneum and the rectosigmoid colon. The colon proximal to the  rectum was filled with chronic stool but there was no sign of any cancer. We resected all of the diseased bowel. He was left with 15 feet of small bowel and there was no evidence of Crohn's disease grossly in the proximal small bowel.  Procedure in Detail:          Following the induction of general endotracheal anesthesia, Dr. Lorin Picket McDiarmid performed cystoscopy and insertion of bilateral ureteral stents, leaving a Foley in place. The patient was placed supine and the abdomen was prepped and draped in a sterile fashion. Intravenous antibiotics were given preop. Surgical time out was performed.  Midline laparotomy incision was performed both above and below the umbilicus and  the old scar was excised. We entered the peritoneal space in the upper abdomen and then slowly opened the fascia down to the infraumbilical area where we encountered the inflammatory mass adherent to the anterior abdominal wall. We're able to bluntly dissect this off small steps at a time and open up the abdominal space. We spent almost 2 hours mobilizing the inflammatory mass and doing a subtotal colectomy. We took  the dissection down off of the anterior abdominal wall and filled the bladder with saline so we could tell what was the bladder and  then dissected the inflammatory mass off the back wall of the bladder. We entered the distal sigmoid as part of the inflammatory mass but we were able to get below that and we stapled off the proximal rectum with a GIA stapling device. I marked the rectal stump with 2 sutures of Prolene. Rectal stump was above the peritoneal reflection.  We could feel the ureters and avoid injury to the ureters or the back wall of the bladder. We gave indigo carmine and there was never any leak of dye into the peritoneal space from the bladder or retroperitoneum. We transected the ileum just proximal to the inflammatory mass with a GIA stapling device. We mobilized the right colon by dividing its lateral  peritoneal attachments. We dissected the omentum off of the transverse colon. We took down the splenic flexure. We divided the mesentery of the small bowel, right colon, transverse colon, descending colon, sigmoid colon in numerous small steps. Vessels of any size were clamped, divided, and ligated with 2-0 silk ties or 2-0 silk ligatures.  After this was done we removed the distal ileal inflammatory mass and a subtotal colectomy specimen in one piece.  I spent some  time irrigating out the subphrenic spaces, abdomen and pelvis. Hemostasis was good and achieved with electrocautery and suture ligatures. We checked the bladder and the ureters and they looked fine.. We returned the small bowel to its anatomic position. We placed a 57 [place] Blake drain down into the pelvis and brought out through a stab wound in the right suprapubic area and sutured it to the skin with nylon suture. It was connected to a suction bulb.  We had previously marked the site for the right-sided ileostomy. The ostomy nurse had marked it above the umbilicus. I excised a circular button of skin. We incised the anterior rectus sheath in a cruciate fashion. We retracted the rectus muscles and incised the posterior rectus sheath and dilated there tract almost 2 finger breadths. We very carefully positioned the ileum and brought it through the wound. The ileum was very pink and healthy at this point. We were able to bring about 4 cm of ileum above the skin. We sutured the small bowel to the peritoneum at the ostomy site with interrupted sutures of 3-0 silk.  We placed Seprafilm, for large sheets on top of the small bowel. We closed the midline fascia with a running suture of #1 double stranded PDS in numerous interrupted sutures of #1 Novofil. We closed the skin loosely with a few staples and place an umbilical tape under the staples and packed it open. I amputated the staple line on the ileostomy and matured the ileostomy with 8 interrupted  sutures of 3-0 Vicryl. This made a nice bud it was quite pink and viable. Ostomy appliance was placed.  I removed the ureteral stents at this point and left the Foley in place. Kling bandages were placed. The patient tolerated the procedure well. The patient was taken recovery in stable condition. EBL 250 cc. Consultations none. Counts correct.     Angelia Mould. Derrell Lolling, M.D., FACS General and Minimally Invasive Surgery Breast and Colorectal Surgery  04/16/2012 3:46 PM

## 2012-04-16 NOTE — Op Note (Signed)
Preoperative diagnosis: Crohn's disease and fistula Postoperative diagnosis: Crohn's disease and fistula; preoperative stents required Surgery: Cystoscopy bilateral retrograde ureterograms and insertion of bilateral ureteral catheters Surgeon: Dr. Alfredo Martinez  The patient has a complicated bowel issue with Crohn's disease requiring exploratory laparotomy and resection of ileal mass. Dr. Derrell Lolling wanted ureteral catheters placed to make certain he did not have a ureteral injury in the make certain he did not have hydronephrosis or torturous ureters preoperatively  21 French scope was utilized. Penile bulbar membranous and prostatic urethra normal. He grade 1 and a 4 bladder trabeculation. He does very small ureteral orifice on the right and a small one on the left as well  Under cystoscopic guidance a sensor wire was passed the level of the iliacs also using fluoroscopy. Open-ended ureteral catheter was passed of as level removing the wire. Gentle retrograde showed non-hydronephrotic left ureter that was non-tortuous. Sensor wire was passed a renal pelvis. Open-ended ureteral catheter was placed gently up to the renal pelvis on the left. Sensor wire was removed  An identical procedure was done on the right. Some early the ureter was nondilated and was straight. The right ureteral orifice was surprisingly very small but if I could negotiate a sensor wire through it followed by the open-ended ureteral catheter using same technique  Both ureteral catheters were connected to the Foley catheter using the triple connector as noted. The ureteral catheters will be removed following the procedure

## 2012-04-16 NOTE — Anesthesia Preprocedure Evaluation (Signed)
Anesthesia Evaluation  Patient identified by MRN, date of birth, ID band Patient awake    Reviewed: Allergy & Precautions, H&P , NPO status , Patient's Chart, lab work & pertinent test results  Airway Mallampati: II  Neck ROM: full    Dental   Pulmonary          Cardiovascular + dysrhythmias Atrial Fibrillation + Valvular Problems/Murmurs AS     Neuro/Psych    GI/Hepatic Crohn's dz   Endo/Other    Renal/GU      Musculoskeletal   Abdominal   Peds  Hematology   Anesthesia Other Findings   Reproductive/Obstetrics                           Anesthesia Physical Anesthesia Plan  ASA: III  Anesthesia Plan: General   Post-op Pain Management:    Induction: Intravenous  Airway Management Planned: Oral ETT  Additional Equipment:   Intra-op Plan:   Post-operative Plan: Extubation in OR  Informed Consent: I have reviewed the patients History and Physical, chart, labs and discussed the procedure including the risks, benefits and alternatives for the proposed anesthesia with the patient or authorized representative who has indicated his/her understanding and acceptance.     Plan Discussed with: CRNA and Surgeon  Anesthesia Plan Comments:         Anesthesia Quick Evaluation

## 2012-04-16 NOTE — Progress Notes (Signed)
ANTIBIOTIC CONSULT NOTE - INITIAL  Pharmacy Consult for Zosyn Indication: empiric post-op coverage  No Known Allergies  Patient Measurements: Height: 5\' 9"  (175.3 cm) Weight: 140 lb 6.9 oz (63.7 kg) IBW/kg (Calculated) : 70.7   Vital Signs: Temp: 97.7 F (36.5 C) (06/07 1730) BP: 105/38 mmHg (06/07 1730) Pulse Rate: 67  (06/07 1730) Intake/Output from previous day: 06/06 0701 - 06/07 0700 In: 3259.3 [P.O.:720; I.V.:477.5; IV Piggyback:110; TPN:1951.8] Out: 1000 [Urine:1000] Intake/Output from this shift: Total I/O In: 2900 [I.V.:2400; IV Piggyback:500] Out: 1330 [Urine:690; Emesis/NG output:80; Drains:160; Blood:400]  Labs:  Basename 04/16/12 0521 04/15/12 2200 04/15/12 1145 04/15/12 0614  WBC -- 6.4 6.9 --  HGB -- 11.0* 10.6* --  PLT -- 428* 438* --  LABCREA -- -- -- --  CREATININE 0.88 0.90 -- 0.82   Estimated Creatinine Clearance: 68.4 ml/min (by C-G formula based on Cr of 0.88).    Microbiology: Recent Results (from the past 720 hour(s))  SURGICAL PCR SCREEN     Status: Normal   Collection Time   04/15/12 10:43 PM      Component Value Range Status Comment   MRSA, PCR NEGATIVE  NEGATIVE  Final    Staphylococcus aureus NEGATIVE  NEGATIVE  Final     Medical History: Past Medical History  Diagnosis Date  . Hyperlipidemia   . PAF (paroxysmal atrial fibrillation)   . Aortic stenosis, mild   . Crohn's disease   . PAC (premature atrial contraction)   . Palpitations   . Macular degeneration   . Cataract   . Colonic hemorrhage 1964    Medications:  Scheduled:    . antiseptic oral rinse  15 mL Mouth Rinse q12n4p  . chlorhexidine  1 application Topical Once  . chlorhexidine  15 mL Mouth Rinse BID  . digoxin  0.125 mg Intravenous Daily  . heparin subcutaneous  5,000 Units Subcutaneous Once  . heparin  5,000 Units Subcutaneous Q8H  . insulin aspart  0-9 Units Subcutaneous Q6H  . metoprolol  5 mg Intravenous Q6H  . pantoprazole (PROTONIX) IV  40 mg  Intravenous QHS  . piperacillin-tazobactam (ZOSYN)  IV  3.375 g Intravenous Q8H  . DISCONTD: metoprolol tartrate  25 mg Oral BID  . DISCONTD: polyethylene glycol  17 g Oral BID   Assessment: 71 yo M on Zosyn since 6/1 for empiric GI coverage.  Pt is s/p ex-lap and subtotal coletomy today.  Renal function remains stable with SCr <1, est CrCl ~ 60.  Goal of Therapy:  Renal dose adjustment of antibiotics  Plan:  Continue Zosyn 3.375 gm IV q8h (4 hour infusion). No further dose adjustments anticipated. Pharmacy will sign off.  [employer], Pharm.D., BCPS Clinical Pharmacist Pager [phone] 04/16/2012 6:43 PM

## 2012-04-17 LAB — BASIC METABOLIC PANEL
CO2: 22 mEq/L (ref 19–32)
Calcium: 7.7 mg/dL — ABNORMAL LOW (ref 8.4–10.5)
Chloride: 108 mEq/L (ref 96–112)
Creatinine, Ser: 0.76 mg/dL (ref 0.50–1.35)
Glucose, Bld: 181 mg/dL — ABNORMAL HIGH (ref 70–99)

## 2012-04-17 LAB — CBC
HCT: 26.5 % — ABNORMAL LOW (ref 39.0–52.0)
MCH: 25.7 pg — ABNORMAL LOW (ref 26.0–34.0)
MCV: 79.1 fL (ref 78.0–100.0)
Platelets: 358 10*3/uL (ref 150–400)
RDW: 16.4 % — ABNORMAL HIGH (ref 11.5–15.5)
WBC: 14.3 10*3/uL — ABNORMAL HIGH (ref 4.0–10.5)

## 2012-04-17 LAB — GLUCOSE, CAPILLARY
Glucose-Capillary: 177 mg/dL — ABNORMAL HIGH (ref 70–99)
Glucose-Capillary: 134 mg/dL — ABNORMAL HIGH (ref 70–99)

## 2012-04-17 MED ORDER — SODIUM CHLORIDE 0.9 % IV SOLN
500.0000 mL | Freq: Once | INTRAVENOUS | Status: AC
  Administered 2012-04-17: 500 mL via INTRAVENOUS

## 2012-04-17 MED ORDER — CLINIMIX E/DEXTROSE (5/20) 5 % IV SOLN
INTRAVENOUS | Status: AC
  Administered 2012-04-17: 18:00:00 via INTRAVENOUS
  Filled 2012-04-17: qty 2000

## 2012-04-17 NOTE — Progress Notes (Signed)
[facility] Progress Note 1 Day Post-Op  Subjective: The patient has been having significant pain all night.  Urine output marginal.  Has not been out of bed.  Objective: Vital signs in last 24 hours: Temp:  [96.8 F (36 C)-98.6 F (37 C)] 98.6 F (37 C) (06/08 0432) Pulse Rate:  [60-141] 90  (06/08 0401) Resp:  [12-27] 25  (06/08 0401) BP: (93-159)/(34-78) 121/62 mmHg (06/08 0401) SpO2:  [98 %-100 %] 99 % (06/08 0401) Weight:  [63.7 kg (140 lb 6.9 oz)-67 kg (147 lb 11.3 oz)] 67 kg (147 lb 11.3 oz) (06/08 0432) Last BM Date: 04/15/12  Intake/Output from previous day: 06/07 0701 - 06/08 0700 In: 4085 [I.V.:2637; IV Piggyback:500; TPN:948] Out: 1750 [Urine:690; Emesis/NG output:80; Drains:180; Stool:400; Blood:400] Intake/Output this shift:    General: Mild acute distress.  Seems uncomfortable  Lungs: Clear to auscultation.   Oxygen saturation 99% on RA.  Abd: Soft, flat, hypoactive bowel sounds.  RUQ ostomy is pink and viable.  NGT output minimal.  Extremities: No DVT signs or symptoms.  Neuro: Intact  Lab Results:  @LABLAST2 (wbc:2,hgb:2,hct:2,plt:2) BMET  Basename 04/17/12 0500 04/16/12 1900  NA 135 136  K 4.3 3.7  CL 108 106  CO2 22 24  GLUCOSE 181* 200*  BUN 20 17  CREATININE 0.76 0.76  CALCIUM 7.7* 7.8*   PT/INR No results found for this basename: LABPROT:2,INR:2 in the last 72 hours ABG No results found for this basename: PHART:2,PCO2:2,PO2:2,HCO3:2 in the last 72 hours  Studies/Results: Dg Cystogram  04/16/2012  *RADIOLOGY REPORT*  Clinical Data: Crohn's disease.  Ileocolonic fistula. Ureteral stent placement to  prevent ureteral injury during surgery.  CYSTOGRAM - 3+ VIEW  Technique: Six spot fluoroscopic images from bilateral retrograde ureterogram study submitted.  Comparison:  Abdominal pelvic CT 04/09/2012.  Findings: Contrast enhanced views of both ureters demonstrate no ureteral dilatation.  There are probable air bubbles within the ureters  bilaterally.  No ureteral stricture is identified.  Final image demonstrates placement of bilateral ureteral stents.  IMPRESSION: Ureteral stent placement prior to abdominal surgery.  No evidence of ureteral stricture.  Original Report Authenticated By: Gerrianne Scale, M.D.    Anti-infectives: Anti-infectives     Start     Dose/Rate Route Frequency Ordered Stop   04/10/12 0000  piperacillin-tazobactam (ZOSYN) IVPB 3.375 g       3.375 g 12.5 mL/hr over 240 Minutes Intravenous 3 times per day 04/09/12 2345     04/09/12 2115  piperacillin-tazobactam (ZOSYN) IVPB 3.375 g       3.375 g 100 mL/hr over 30 Minutes Intravenous  Once 04/09/12 2114 04/09/12 2239          Assessment/Plan: s/p Procedure(s): EXPLORATORY LAPAROTOMY ILEOSTOMY CYSTOSCOPY WITH RETROGRADE PYELOGRAM, URETEROSCOPY AND STENT PLACEMENT PAS Continue ABX therapy due to Post-op infection Urine output is marginal.  Will need fluids although getting hourly rate of > 200cc/hr Bolus with fluids. Allow ice chips. Out of bed.  LOS: 8 days   Marta Lamas. Gae Bon, MD, FACS [phone] [facility] Surgery 04/17/2012

## 2012-04-17 NOTE — Progress Notes (Signed)
PARENTERAL NUTRITION CONSULT NOTE - Follow Up  Pharmacy Consult for TPN Indication: protein cal malnutrition, weight loss, s/p ex lap w/ resection of ileal mass + subtotal colectomy + ileostomy  No Known Allergies  Patient Measurements: Height: 5\' 9"  (175.3 cm) Weight: 147 lb 11.3 oz (67 kg) IBW/kg (Calculated) : 70.7  Usual Weight: 20 lb weight loss over the last 6 months noted.  Vital Signs: Temp: 98.6 F (37 C) (06/08 0432) Temp src: Axillary (06/08 0432) BP: 121/62 mmHg (06/08 0401) Pulse Rate: 90  (06/08 0401) Intake/Output from previous day: 06/07 0701 - 06/08 0700 In: 4085 [I.V.:2637; IV Piggyback:500; TPN:948] Out: 1750 [Urine:690; Emesis/NG output:80; Drains:180; Stool:400; Blood:400] Intake/Output from this shift:   Labs:  Basename 04/17/12 0500 04/16/12 1900 04/15/12 2200  WBC 14.3* 16.4* 6.4  HGB 8.6* 8.8* 11.0*  HCT 26.5* 27.4* 34.4*  PLT 358 426* 428*  APTT -- -- --  INR -- -- --    Basename 04/17/12 0500 04/16/12 1900 04/16/12 0521 04/15/12 2200 04/15/12 0614 04/14/12 1252  NA 135 136 140 -- -- --  K 4.3 3.7 3.9 -- -- --  CL 108 106 107 -- -- --  CO2 22 24 26  -- -- --  GLUCOSE 181* 200* 120* -- -- --  BUN 20 17 12  -- -- --  CREATININE 0.76 0.76 0.88 -- -- --  LABCREA -- -- -- -- -- --  CREAT24HRUR -- -- -- -- -- --  CALCIUM 7.7* 7.8* 8.8 -- -- --  MG -- -- -- -- 2.1 2.2  PHOS -- -- -- -- 2.9 3.3  PROT -- -- -- 7.2 6.0 --  ALBUMIN -- -- -- 3.3* 2.6* --  AST -- -- -- 14 12 --  ALT -- -- -- 8 7 --  ALKPHOS -- -- -- 55 46 --  BILITOT -- -- -- 0.3 0.2* --  BILIDIR -- -- -- -- -- --  IBILI -- -- -- -- -- --  PREALBUMIN -- -- -- -- -- --  TRIG -- -- -- -- -- --  CHOLHDL -- -- -- -- -- --  CHOL -- -- -- -- -- --   Estimated Creatinine Clearance: 79.1 ml/min (by C-G formula based on Cr of 0.76).    Basename 04/17/12 0658 04/16/12 0542 04/15/12 2345  GLUCAP 177* 117* 125*    Medical History: Past Medical History  Diagnosis Date  .  Hyperlipidemia   . PAF (paroxysmal atrial fibrillation)   . Aortic stenosis, mild   . Crohn's disease   . PAC (premature atrial contraction)   . Palpitations   . Macular degeneration   . Cataract   . Colonic hemorrhage 1964    Medications:  Scheduled:     . antiseptic oral rinse  15 mL Mouth Rinse q12n4p  . chlorhexidine  15 mL Mouth Rinse BID  . digoxin  0.125 mg Intravenous Daily  . heparin subcutaneous  5,000 Units Subcutaneous Once  . heparin  5,000 Units Subcutaneous Q8H  . insulin aspart  0-9 Units Subcutaneous Q6H  . metoprolol  5 mg Intravenous Q6H  . morphine   Intravenous Q4H  . morphine      . pantoprazole (PROTONIX) IV  40 mg Intravenous QHS  . piperacillin-tazobactam (ZOSYN)  IV  3.375 g Intravenous Q8H  . DISCONTD: metoprolol tartrate  25 mg Oral BID  . DISCONTD: polyethylene glycol  17 g Oral BID   Infusions:     . 0.9 % NaCl with KCl 20 mEq /  L 125 mL/hr (04/17/12 0741)  . fat emulsion 240 mL (04/16/12 2014)  . TPN (CLINIMIX) +/- additives Stopped (04/16/12 1611)  . TPN (CLINIMIX) +/- additives 80 mL/hr at 04/16/12 2013  . DISCONTD: sodium chloride 20 mL/hr at 04/15/12 1853  . DISCONTD: lactated ringers 50 mL/hr at 04/16/12 1141   Insulin Requirements in the past 24 hours:  0 units SSI,  No insulin in TPN BAG  Current Nutrition:  Clinimix E5/20 at 81ml/hr (at goal)  Nutritional Goals:  1850-2100 kCal, 85-105 grams of protein per day  Assessment: 71 yo M w/ 35 year h/o Crohn's disease, admitted following 5 month h/o of intermittent abdominal pain.  H/o multiple GI surgeries in the 1950s/1960s including colon and SB surgery due to Crohn's.  Last treatment of Crohn's in Nov 2012 for hematochezia.   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 S/p surgery 6/7 - exlap, resection of ileal mass, subtotal colectomy and ileostomy + bilateral ureterogram and ureteral catheters.  BM overnight, no N/V.  Remains NPO  except ice chips.  Endo No h/o DM - CBGs elevated 170-200 post-op but not insulin given.   Lytes All lytes WNL - received NS w/ 66meq/L at 173ml/hr due to minimal urine output - will need to continue to re-evaluate (also getting potassium in the TPN)  Renal SCr stable, UOP 0.4 ml/kg/hr, I/O: +2.3L  Hepatobil LFTs WNL.  TG 106, prealbumin 11.3  Pulm/Neuro 99% 2L [place]  Cardiology Hx PAF - no coumadin PTA. BP well controlled, HR has been on the low side but now some tachycardia post-op. Continues on dig, metoprolol IV scheduled  ID Afeb, WBC 14.3,  empiric abx for abdominal abscess/phelgmon.  No cultures.   Zosyn: 6/1>>   Px MC, PTX IV, SQ heparin for dvt pxl   Plan:  - Continue Clinimix E5/20 at goal rate of 80 ml/hr (provides weekly average of 1895 kcal and 96 g protein per day) - MVI, TE, lipids at 10 ml/hr MWF only 2/2 national shortage - F/u am BMP and CBGs - F/u post-op diet and fluid plans  Lysle Pearl, PharmD, BCPS Pager # [phone] 04/17/2012 7:59 AM

## 2012-04-18 LAB — BASIC METABOLIC PANEL
CO2: 21 mEq/L (ref 19–32)
Chloride: 111 mEq/L (ref 96–112)
Creatinine, Ser: 0.91 mg/dL (ref 0.50–1.35)
Glucose, Bld: 133 mg/dL — ABNORMAL HIGH (ref 70–99)
Sodium: 137 mEq/L (ref 135–145)

## 2012-04-18 LAB — DIFFERENTIAL
Basophils Absolute: 0 10*3/uL (ref 0.0–0.1)
Basophils Relative: 0 % (ref 0–1)
Eosinophils Absolute: 0 10*3/uL (ref 0.0–0.7)
Eosinophils Relative: 0 % (ref 0–5)
Lymphocytes Relative: 3 % — ABNORMAL LOW (ref 12–46)
Lymphs Abs: 0.4 10*3/uL — ABNORMAL LOW (ref 0.7–4.0)
Monocytes Absolute: 0.4 10*3/uL (ref 0.1–1.0)
Monocytes Relative: 3 % (ref 3–12)
Neutro Abs: 12.9 10*3/uL — ABNORMAL HIGH (ref 1.7–7.7)
Neutrophils Relative %: 94 % — ABNORMAL HIGH (ref 43–77)

## 2012-04-18 LAB — CBC
HCT: 19.7 % — ABNORMAL LOW (ref 39.0–52.0)
Hemoglobin: 6.4 g/dL — CL (ref 13.0–17.0)
RBC: 2.46 MIL/uL — ABNORMAL LOW (ref 4.22–5.81)
RDW: 16.9 % — ABNORMAL HIGH (ref 11.5–15.5)
WBC: 13.7 10*3/uL — ABNORMAL HIGH (ref 4.0–10.5)

## 2012-04-18 LAB — GLUCOSE, CAPILLARY
Glucose-Capillary: 146 mg/dL — ABNORMAL HIGH (ref 70–99)
Glucose-Capillary: 150 mg/dL — ABNORMAL HIGH (ref 70–99)

## 2012-04-18 LAB — HEMOGLOBIN AND HEMATOCRIT, BLOOD
HCT: 19.6 % — ABNORMAL LOW (ref 39.0–52.0)
Hemoglobin: 6.4 g/dL — CL (ref 13.0–17.0)

## 2012-04-18 MED ORDER — SODIUM CHLORIDE 0.9 % IV SOLN
12.5000 mg | Freq: Four times a day (QID) | INTRAVENOUS | Status: DC | PRN
  Administered 2012-04-18: 12.5 mg via INTRAVENOUS
  Filled 2012-04-18 (×2): qty 0.5

## 2012-04-18 MED ORDER — CLINIMIX E/DEXTROSE (5/20) 5 % IV SOLN
INTRAVENOUS | Status: AC
  Administered 2012-04-18: 17:00:00 via INTRAVENOUS
  Filled 2012-04-18: qty 2000

## 2012-04-18 MED ORDER — MORPHINE SULFATE 2 MG/ML IJ SOLN
1.0000 mg | INTRAMUSCULAR | Status: DC | PRN
  Administered 2012-04-21 – 2012-04-22 (×5): 2 mg via INTRAVENOUS
  Filled 2012-04-18 (×6): qty 1

## 2012-04-18 NOTE — Progress Notes (Signed)
Hemoglobin 6.4, notified MD. RN made aware during shift report.  No new orders obtained.

## 2012-04-18 NOTE — Progress Notes (Signed)
Patient ID: Nicholas [name], male   DOB: December 19, 1940, 71 y.o.   MRN: [id number] [facility] Progress Note 2 Days Post-Op  Subjective: UOP improved overnight, but still marginal.  Pain is improved.  Pt wants to get rid of PCA because of all the beeping the machine does at night.    Objective: Vital signs in last 24 hours: Temp:  [98.4 F (36.9 C)-98.8 F (37.1 C)] 98.4 F (36.9 C) (06/09 0748) Pulse Rate:  [71-107] 82  (06/09 0748) Resp:  [15-26] 19  (06/09 0748) BP: (109-158)/(48-66) 158/62 mmHg (06/09 0748) SpO2:  [98 %-100 %] 100 % (06/09 0748) Weight:  [152 lb 1.9 oz (69 kg)] 152 lb 1.9 oz (69 kg) (06/09 0551) Last BM Date: 04/15/12  Intake/Output from previous day: 06/08 0701 - 06/09 0700 In: 5195 [I.V.:2875; IV Piggyback:50; TPN:1830] Out: 1311 [Urine:1150; Drains:160; Stool:1] Intake/Output this shift: Total I/O In: -  Out: 250 [Urine:250]  General: No distress.  Looks comfortable.    Lungs:   Oxygen saturation 99% on RA.  Abd: Soft, flat, appropriately tender.  RUQ ostomy is pink.  NGT output minimal.  Extremities: No DVT signs or symptoms.  Neuro: Intact  Lab Results:  CBC    Component Value Date/Time   WBC 13.7* 04/18/2012 0630   RBC 2.46* 04/18/2012 0630   HGB 6.4* 04/18/2012 0630   HCT 19.7* 04/18/2012 0630   PLT 267 04/18/2012 0630   MCV 80.1 04/18/2012 0630   MCH 26.0 04/18/2012 0630   MCHC 32.5 04/18/2012 0630   RDW 16.9* 04/18/2012 0630   LYMPHSABS 0.4* 04/18/2012 0630   MONOABS 0.4 04/18/2012 0630   EOSABS 0.0 04/18/2012 0630   BASOSABS 0.0 04/18/2012 0630     BMET  Basename 04/18/12 0630 04/17/12 0500  NA 137 135  K 4.3 4.3  CL 111 108  CO2 21 22  GLUCOSE 133* 181*  BUN 19 20  CREATININE 0.91 0.76  CALCIUM 8.1* 7.7*   PT/INR No results found for this basename: LABPROT:2,INR:2 in the last 72 hours ABG No results found for this basename: PHART:2,PCO2:2,PO2:2,HCO3:2 in the last 72 hours  Studies/Results: Dg Cystogram  04/16/2012  *RADIOLOGY REPORT*  Clinical Data:  Crohn's disease.  Ileocolonic fistula. Ureteral stent placement to  prevent ureteral injury during surgery.  CYSTOGRAM - 3+ VIEW  Technique: Six spot fluoroscopic images from bilateral retrograde ureterogram study submitted.  Comparison:  Abdominal pelvic CT 04/09/2012.  Findings: Contrast enhanced views of both ureters demonstrate no ureteral dilatation.  There are probable air bubbles within the ureters bilaterally.  No ureteral stricture is identified.  Final image demonstrates placement of bilateral ureteral stents.  IMPRESSION: Ureteral stent placement prior to abdominal surgery.  No evidence of ureteral stricture.  Original Report Authenticated By: Gerrianne Scale, M.D.    Anti-infectives: Anti-infectives     Start     Dose/Rate Route Frequency Ordered Stop   04/10/12 0000   piperacillin-tazobactam (ZOSYN) IVPB 3.375 g        3.375 g 12.5 mL/hr over 240 Minutes Intravenous 3 times per day 04/09/12 2345     04/09/12 2115   piperacillin-tazobactam (ZOSYN) IVPB 3.375 g        3.375 g 100 mL/hr over 30 Minutes Intravenous  Once 04/09/12 2114 04/09/12 2239          Assessment/Plan: s/p Procedure(s): EXPLORATORY LAPAROTOMY ILEOSTOMY CYSTOSCOPY WITH RETROGRADE PYELOGRAM, URETEROSCOPY AND STENT PLACEMENT PAS Continue ABX therapy due to Post-op infection Urine output is slightly improved Acute blood loss anemia -  recheck H&H.  If still 6.4, would transfuse.  D/c PCA.  Use prn morphine.   Allow ice chips. Out of bed.  LOS: 9 days    04/18/2012

## 2012-04-18 NOTE — Progress Notes (Signed)
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  Vital Signs: Temp: 98.4 F (36.9 C) (06/09 0748) Temp src: Oral (06/09 0748) BP: 158/62 mmHg (06/09 0748) Pulse Rate: 82  (06/09 0748) Intake/Output from previous day: 06/08 0701 - 06/09 0700 In: 5195 [I.V.:2875; IV Piggyback:50; TPN:1830] Out: 1311 [Urine:1150; Drains:160; Stool:1] Intake/Output from this shift:   Labs:  Basename 04/18/12 0630 04/17/12 0500 04/16/12 1900  WBC 13.7* 14.3* 16.4*  HGB 6.4* 8.6* 8.8*  HCT 19.7* 26.5* 27.4*  PLT 267 358 426*  APTT -- -- --  INR -- -- --    Basename 04/18/12 0630 04/17/12 0500 04/16/12 1900 04/15/12 2200  NA 137 135 136 --  K 4.3 4.3 3.7 --  CL 111 108 106 --  CO2 21 22 24  --  GLUCOSE 133* 181* 200* --  BUN 19 20 17  --  CREATININE 0.91 0.76 0.76 --  LABCREA -- -- -- --  CREAT24HRUR -- -- -- --  CALCIUM 8.1* 7.7* 7.8* --  MG -- -- -- --  PHOS -- -- -- --  PROT -- -- -- 7.2  ALBUMIN -- -- -- 3.3*  AST -- -- -- 14  ALT -- -- -- 8  ALKPHOS -- -- -- 55  BILITOT -- -- -- 0.3  BILIDIR -- -- -- --  IBILI -- -- -- --  PREALBUMIN -- -- -- --  TRIG -- -- -- --  CHOLHDL -- -- -- --  CHOL -- -- -- --   Estimated Creatinine Clearance: 71.6 ml/min (by C-G formula based on Cr of 0.91).    Basename 04/18/12 0610 04/18/12 0031 04/17/12 1813  GLUCAP 146* 148* 134*    Medical History: Past Medical History  Diagnosis Date  . Hyperlipidemia   . PAF (paroxysmal atrial fibrillation)   . Aortic stenosis, mild   . Crohn's disease   . PAC (premature atrial contraction)   . Palpitations   . Macular degeneration   . Cataract   . Colonic  hemorrhage 1964    Medications:  Scheduled:     . sodium chloride  500 mL Intravenous Once  . antiseptic oral rinse  15 mL Mouth Rinse q12n4p  . chlorhexidine  15 mL Mouth Rinse BID  . digoxin  0.125 mg Intravenous Daily  . heparin  5,000 Units Subcutaneous Q8H  . insulin aspart  0-9 Units Subcutaneous Q6H  . metoprolol  5 mg Intravenous Q6H  . morphine   Intravenous Q4H  . pantoprazole (PROTONIX) IV  40 mg Intravenous QHS  . piperacillin-tazobactam (ZOSYN)  IV  3.375 g Intravenous Q8H   Infusions:     . 0.9 % NaCl with KCl 20 mEq / L 125 mL/hr at 04/18/12 0150  . fat emulsion 240 mL (04/16/12 2014)  . TPN (CLINIMIX) +/- additives 80 mL/hr at 04/16/12 2013  . TPN (CLINIMIX) +/- additives 80 mL/hr at 04/17/12 1808   Insulin Requirements in the past 24 hours:  3 units SSI,  no insulin in TPN BAG  Current Nutrition:  Clinimix E5/20 at 24ml/hr (at goal)  Nutritional Goals:  1850-2100 kCal, 85-105 grams of protein per day  Assessment: 71 yo M  w/ 33 year h/o Crohn's disease, admitted following 5 month h/o of intermittent abdominal pain.  H/o multiple GI surgeries in the 1950s/1960s including colon and SB surgery due to Crohn's.  Last treatment of Crohn's in Nov 2012 for hematochezia.   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 S/p surgery 6/7 - exlap, resection of ileal mass, subtotal colectomy and ileostomy + bilateral ureterogram and ureteral catheters.  Remains NPO except ice chips.  Endo No h/o DM - CBGs at goal and received minimal insulin - may consider DC SSI   Lytes All lytes WNL - received NS w/ 55meq/L at 165ml/hr due to minimal urine output - will need to continue to re-evaluate (also getting potassium in the TPN)  Renal SCr stable, UOP improved to 0.8 ml/kg/hr, I/O: +3.8L  Hepatobil LFTs WNL.  TG 106, prealbumin 11.3  Pulm/Neuro 100% 2L [place]  Cardiology Hx PAF - no coumadin PTA. BP elevated this AM 158/62, HR 71-107  Continues on dig, metoprolol IV scheduled  ID Afeb, WBC trending down to 13.7,  empiric abx for abdominal abscess/phelgmon.  No cultures.   Zosyn: 6/1>>   Px MC, PTX IV, SQ heparin for dvt pxl   Plan:  - Continue Clinimix E5/20 at goal rate of 80 ml/hr (provides weekly average of 1895 kcal and 96 g protein per day) - MVI, TE, lipids at 10 ml/hr MWF only 2/2 national shortage - F/u AM labs - F/u post-op diet and fluid plans  Lysle Pearl, PharmD, BCPS Pager # [phone] 04/18/2012 7:50 AM

## 2012-04-19 ENCOUNTER — Encounter (HOSPITAL_COMMUNITY): Payer: Self-pay | Admitting: General Surgery

## 2012-04-19 LAB — DIFFERENTIAL
Basophils Absolute: 0 10*3/uL (ref 0.0–0.1)
Basophils Relative: 0 % (ref 0–1)
Eosinophils Absolute: 0 10*3/uL (ref 0.0–0.7)
Eosinophils Relative: 0 % (ref 0–5)
Lymphocytes Relative: 3 % — ABNORMAL LOW (ref 12–46)
Lymphs Abs: 0.4 10*3/uL — ABNORMAL LOW (ref 0.7–4.0)
Monocytes Absolute: 0.3 10*3/uL (ref 0.1–1.0)
Monocytes Relative: 3 % (ref 3–12)
Neutro Abs: 11.9 10*3/uL — ABNORMAL HIGH (ref 1.7–7.7)
Neutrophils Relative %: 94 % — ABNORMAL HIGH (ref 43–77)

## 2012-04-19 LAB — GLUCOSE, CAPILLARY
Glucose-Capillary: 152 mg/dL — ABNORMAL HIGH (ref 70–99)
Glucose-Capillary: 155 mg/dL — ABNORMAL HIGH (ref 70–99)
Glucose-Capillary: 159 mg/dL — ABNORMAL HIGH (ref 70–99)
Glucose-Capillary: 163 mg/dL — ABNORMAL HIGH (ref 70–99)

## 2012-04-19 LAB — COMPREHENSIVE METABOLIC PANEL
ALT: 7 U/L (ref 0–53)
AST: 15 U/L (ref 0–37)
Albumin: 1.5 g/dL — ABNORMAL LOW (ref 3.5–5.2)
Alkaline Phosphatase: 57 U/L (ref 39–117)
BUN: 14 mg/dL (ref 6–23)
CO2: 21 mEq/L (ref 19–32)
Calcium: 8.3 mg/dL — ABNORMAL LOW (ref 8.4–10.5)
Chloride: 109 mEq/L (ref 96–112)
Creatinine, Ser: 0.7 mg/dL (ref 0.50–1.35)
GFR calc Af Amer: >90 mL/min (ref >90)
GFR calc non Af Amer: >90 mL/min (ref >90)
Glucose, Bld: 154 mg/dL — ABNORMAL HIGH (ref 70–99)
Potassium: 4 mEq/L (ref 3.5–5.1)
Sodium: 135 mEq/L (ref 135–145)
Total Bilirubin: 0.3 mg/dL (ref 0.3–1.2)
Total Protein: 4.3 g/dL — ABNORMAL LOW (ref 6.0–8.3)

## 2012-04-19 LAB — MAGNESIUM: Magnesium: 2 mg/dL (ref 1.5–2.5)

## 2012-04-19 LAB — CHOLESTEROL, TOTAL: Cholesterol: 60 mg/dL (ref 0–200)

## 2012-04-19 LAB — CBC
HCT: 21.3 % — ABNORMAL LOW (ref 39.0–52.0)
Hemoglobin: 7.1 g/dL — ABNORMAL LOW (ref 13.0–17.0)
MCH: 25.9 pg — ABNORMAL LOW (ref 26.0–34.0)
MCHC: 33.3 g/dL (ref 30.0–36.0)
MCV: 77.7 fL — ABNORMAL LOW (ref 78.0–100.0)
Platelets: 238 10*3/uL (ref 150–400)
RBC: 2.74 MIL/uL — ABNORMAL LOW (ref 4.22–5.81)
RDW: 16.3 % — ABNORMAL HIGH (ref 11.5–15.5)
WBC: 12.7 10*3/uL — ABNORMAL HIGH (ref 4.0–10.5)

## 2012-04-19 LAB — TYPE AND SCREEN
ABO/RH(D): A POS
Antibody Screen: NEGATIVE

## 2012-04-19 LAB — TRIGLYCERIDES: Triglycerides: 103 mg/dL (ref <150)

## 2012-04-19 LAB — PHOSPHORUS: Phosphorus: 1.7 mg/dL — ABNORMAL LOW (ref 2.3–4.6)

## 2012-04-19 LAB — PREALBUMIN: Prealbumin: 6.3 mg/dL — ABNORMAL LOW (ref 17.0–34.0)

## 2012-04-19 MED ORDER — SODIUM PHOSPHATE 3 MMOLE/ML IV SOLN
15.0000 mmol | Freq: Once | INTRAVENOUS | Status: AC
  Administered 2012-04-19: 15 mmol via INTRAVENOUS
  Filled 2012-04-19: qty 5

## 2012-04-19 MED ORDER — ZINC TRACE METAL 1 MG/ML IV SOLN
INTRAVENOUS | Status: AC
  Administered 2012-04-19: 18:00:00 via INTRAVENOUS
  Filled 2012-04-19: qty 2000

## 2012-04-19 MED ORDER — FAT EMULSION 20 % IV EMUL
240.0000 mL | INTRAVENOUS | Status: AC
  Administered 2012-04-19: 240 mL via INTRAVENOUS
  Filled 2012-04-19: qty 250

## 2012-04-19 MED ORDER — METOPROLOL TARTRATE 1 MG/ML IV SOLN
5.0000 mg | Freq: Three times a day (TID) | INTRAVENOUS | Status: DC
  Administered 2012-04-19 – 2012-04-26 (×20): 5 mg via INTRAVENOUS
  Filled 2012-04-19 (×24): qty 5

## 2012-04-19 NOTE — Progress Notes (Signed)
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  Vital Signs: Temp: 98.9 F (37.2 C) (06/10 0443) Temp src: Oral (06/10 0443) BP: 145/60 mmHg (06/10 0800) Pulse Rate: 61  (06/10 0800) Intake/Output from previous day: 06/09 0701 - 06/10 0700 In: 4089 [I.V.:1950; Blood:350; NG/GT:100; IV Piggyback:200; TPN:1369] Out: 1665 [Urine:1375; Drains:290] Intake/Output from this shift: Total I/O In: 167.5 [I.V.:75; IV Piggyback:12.5; TPN:80] Out: -  Labs:  Basename 04/19/12 0411 04/18/12 0905 04/18/12 0630 04/17/12 0500  WBC 12.7* -- 13.7* 14.3*  HGB 7.1* 6.4* 6.4* --  HCT 21.3* 19.6* 19.7* --  PLT 238 -- 267 358  APTT -- -- -- --  INR -- -- -- --    Basename 04/19/12 0411 04/18/12 0630 04/17/12 0500  NA 135 137 135  K 4.0 4.3 4.3  CL 109 111 108  CO2 21 21 22   GLUCOSE 154* 133* 181*  BUN 14 19 20   CREATININE 0.70 0.91 0.76  LABCREA -- -- --  CREAT24HRUR -- -- --  CALCIUM 8.3* 8.1* 7.7*  MG 2.0 -- --  PHOS 1.7* -- --  PROT 4.3* -- --  ALBUMIN 1.5* -- --  AST 15 -- --  ALT 7 -- --  ALKPHOS 57 -- --  BILITOT 0.3 -- --  BILIDIR -- -- --  IBILI -- -- --  PREALBUMIN -- -- --  TRIG 103 -- --  CHOLHDL -- -- --  CHOL 60 -- --   Estimated Creatinine Clearance: 82.6 ml/min (by C-G formula based on Cr of 0.7).    Basename 04/19/12 0613 04/19/12 0046 04/18/12 1819  GLUCAP 152* 155* 157*   Insulin Requirements in the past 24 hours:  5 units SSI,  no insulin in TPN BAG  Current Nutrition:  Clinimix E5/20 at 7ml/hr (at goal)  Nutritional Goals:  1850-2100 kCal, 85-105 grams of protein per day  Assessment: 71 yo M w/ 33 year h/o  Crohn's disease, admitted following 5 month h/o of intermittent abdominal pain.  TPN started pre-op, he is now s/p exlap, resection of ileal mass, subtotal colectomy and ileostomy + bilateral ureterogram and ureteral catheters.    GI NPO except ice chips.  Endo No h/o DM - CBGs at goal and receiving minimal insulin via SSI  Lytes/Renal Scr stable, UOP good. Noted overall positive balance, IVF+TPN currently provides 24ml/kg/day. Phos low, other lytes WNL.  Hepatobil LFTs WNl, Trig and Chol WNL. Prealbumin pending.  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  Cardiology Hx PAF - no coumadin PTA. BP elevated, HR 60s. Continues on dig, metoprolol IV scheduled  ID Afeb, WBC trending down to 12.7,  empiric abx for abdominal process. Zosyn: 6/1>>  Px PPI IV, SQ heparin for dvt pxl   Plan:  - Continue Clinimix E5/20 at goal rate of 80 ml/hr (provides weekly average of 1895 kcal and 96 g protein per day) - MVI, TE, lipids at 10 ml/hr MWF only 2/2 national shortage - Will decrease IVF to [facility] (to give ~49ml/kg/d and limit pos bal) - Please consider d/c SSI and CBG checks - Will F/u AM labs - Will F/u post-op diet plans, and wean TPN  as able. - Will give 15 mmol of NaPhos over 6h today  Nicholas [name] K. Allena Katz, PharmD, BCPS.  Clinical Pharmacist Pager [phone]. 04/19/2012 9:14 AM

## 2012-04-19 NOTE — Progress Notes (Signed)
Patient ID: Nicholas [name], male   DOB: 1941/11/06, 71 y.o.   MRN: [id number] Patient ID: Nicholas [name], male   DOB: 1941/05/30, 71 y.o.   MRN: [id number] [facility] Progress Note 3 Days Post-Op  Subjective: UOP improving slowly but still marginal.  Pain is improved.  Denies nausea/vomiting.  Denies flatus/bm.   Objective: Vital signs in last 24 hours: Temp:  [98 F (36.7 C)-99.7 F (37.6 C)] 98.2 F (36.8 C) (06/10 0901) Pulse Rate:  [59-120] 63  (06/10 0901) Resp:  [16-24] 23  (06/10 0901) BP: (117-166)/(42-75) 155/58 mmHg (06/10 0901) SpO2:  [96 %-100 %] 98 % (06/10 0901) Weight:  [154 lb 5.2 oz (70 kg)] 154 lb 5.2 oz (70 kg) (06/10 0443) Last BM Date: 04/15/12  Intake/Output from previous day: 06/09 0701 - 06/10 0700 In: 4089 [I.V.:1950; Blood:350; NG/GT:100; IV Piggyback:200; TPN:1369] Out: 1665 [Urine:1375; Drains:290] Intake/Output this shift: Total I/O In: 385 [I.V.:150; Other:50; IV Piggyback:25; TPN:160] Out: -   General: No distress.  Looks comfortable.    Lungs:   Oxygen saturation 99% on RA.  Abd: Soft, flat, appropriately tender.  Drain with serosang output.   RUQ ostomy is pink.  NGT output minimal.  Extremities: No DVT signs or symptoms.  Neuro: Intact  Lab Results:  CBC    Component Value Date/Time   WBC 12.7* 04/19/2012 0411   RBC 2.74* 04/19/2012 0411   HGB 7.1* 04/19/2012 0411   HCT 21.3* 04/19/2012 0411   PLT 238 04/19/2012 0411   MCV 77.7* 04/19/2012 0411   MCH 25.9* 04/19/2012 0411   MCHC 33.3 04/19/2012 0411   RDW 16.3* 04/19/2012 0411   LYMPHSABS 0.4* 04/19/2012 0411   MONOABS 0.3 04/19/2012 0411   EOSABS 0.0 04/19/2012 0411   BASOSABS 0.0 04/19/2012 0411     BMET  Basename 04/19/12 0411 04/18/12 0630  NA 135 137  K 4.0 4.3  CL 109 111  CO2 21 21  GLUCOSE 154* 133*  BUN 14 19  CREATININE 0.70 0.91  CALCIUM 8.3* 8.1*   PT/INR No results found for this basename: LABPROT:2,INR:2 in the last 72 hours ABG No results found for this basename:  PHART:2,PCO2:2,PO2:2,HCO3:2 in the last 72 hours  Studies/Results: No results found.  Anti-infectives: Anti-infectives     Start     Dose/Rate Route Frequency Ordered Stop   04/10/12 0000   piperacillin-tazobactam (ZOSYN) IVPB 3.375 g        3.375 g 12.5 mL/hr over 240 Minutes Intravenous 3 times per day 04/09/12 2345     04/09/12 2115   piperacillin-tazobactam (ZOSYN) IVPB 3.375 g        3.375 g 100 mL/hr over 30 Minutes Intravenous  Once 04/09/12 2114 04/09/12 2239          Assessment/Plan: s/p Procedure(s): EXPLORATORY LAPAROTOMY ILEOSTOMY CYSTOSCOPY WITH RETROGRADE PYELOGRAM, URETEROSCOPY AND STENT PLACEMENT PAS Continue ABX therapy due to Post-op infection Urine output is slightly improved Acute blood loss anemia - recheck H&H-7.1 today-cont to monitor.  Pain control good with IV morphine   Allow ice chips, will clamp NGT to see if tolerates but DO NOT d/c.  May need to unclamp if n/v develops.  Awaiting more bowel function before advancing past ice chips Out of bed. Will also consult PT to help with mobilization  LOS: 10 days   Nicholas [name] 9:54 AM 04/19/2012

## 2012-04-19 NOTE — Progress Notes (Signed)
I spoke to the patient's wife as well Will ask WOC RN to see Patient examined and I agree with the assessment and plan  Violeta Gelinas, MD, MPH, FACS Pager: [phone]  04/19/2012 11:32 AM

## 2012-04-19 NOTE — Progress Notes (Signed)
PT Cancellation Note  Evaluation cancelled today due to pt being packed up to transfer to 5100 momentarily.[name]  Nicholas [name] 04/19/2012, 3:28 PM Pager [phone]

## 2012-04-19 NOTE — Consult Note (Addendum)
WOC ostomy consult  Stoma type/location:  Ileostomy stoma to RUQ Stomal assessment/size: Stoma red and viable, above skin level, 11/2 inches Peristomal assessment: Intact skin surrounding Output:  50cc liquid brown stool Ostomy pouching: Pouch leaking behind wafer.  Applied one piece pouch.   Education provided: Transport planner left at bedside.  Family members not present during teaching.  Pt watched pouching demonstration and asked appropriate questions.  Will plan to conduct further teaching sessions when stable and out of ICU area. Supplies ordered to bedside for staff use.  Cammie Mcgee, RN, MSN, [employer]  [phone]

## 2012-04-20 MED ORDER — CLINIMIX E/DEXTROSE (5/20) 5 % IV SOLN
INTRAVENOUS | Status: AC
  Administered 2012-04-20: 18:00:00 via INTRAVENOUS
  Filled 2012-04-20: qty 2000

## 2012-04-20 NOTE — Progress Notes (Signed)
Patient ID: Nicholas [name], male   DOB: [birth year], 71 y.o.   MRN: [id number] Patient ID: Nicholas [name], male   DOB: 01/22/1941, 71 y.o.   MRN: [id number] Patient ID: Nicholas [name], male   DOB: Jan 21, 1941, 71 y.o.   MRN: [id number] [facility] Progress Note 4 Days Post-Op  Subjective: Pt up in chair, pain well controlled, denies n/v with NGT clamped.  +output from ileostomy.  No complaints otherwise.   Objective: Vital signs in last 24 hours: Temp:  [97.5 F (36.4 C)-98.9 F (37.2 C)] 98.6 F (37 C) (06/11 0559) Pulse Rate:  [53-75] 75  (06/11 0559) Resp:  [16-23] 16  (06/11 0559) BP: (135-163)/(48-65) 159/58 mmHg (06/11 0559) SpO2:  [97 %-99 %] 97 % (06/11 0559) Last BM Date: 04/15/12  Intake/Output from previous day: 06/10 0701 - 06/11 0700 In: 1249.5 [I.V.:230; NG/GT:150; IV Piggyback:209.5; TPN:560] Out: 2310 [Urine:1450; Drains:135; Stool:725] Intake/Output this shift: Total I/O In: -  Out: 150 [Drains:150]  General: No distress.  Looks comfortable.    Lungs:   Oxygen saturation 99% on RA.  Abd: Soft, flat, appropriately tender.  Drain with serosang output.   RUQ ostomy is pink with liquid output.  Extremities: No DVT signs or symptoms.  Neuro: Intact  Lab Results:  CBC    Component Value Date/Time   WBC 12.7* 04/19/2012 0411   RBC 2.74* 04/19/2012 0411   HGB 7.1* 04/19/2012 0411   HCT 21.3* 04/19/2012 0411   PLT 238 04/19/2012 0411   MCV 77.7* 04/19/2012 0411   MCH 25.9* 04/19/2012 0411   MCHC 33.3 04/19/2012 0411   RDW 16.3* 04/19/2012 0411   LYMPHSABS 0.4* 04/19/2012 0411   MONOABS 0.3 04/19/2012 0411   EOSABS 0.0 04/19/2012 0411   BASOSABS 0.0 04/19/2012 0411     BMET  Basename 04/19/12 0411 04/18/12 0630  NA 135 137  K 4.0 4.3  CL 109 111  CO2 21 21  GLUCOSE 154* 133*  BUN 14 19  CREATININE 0.70 0.91  CALCIUM 8.3* 8.1*   PT/INR No results found for this basename: LABPROT:2,INR:2 in the last 72 hours ABG No results found for this basename:  PHART:2,PCO2:2,PO2:2,HCO3:2 in the last 72 hours  Studies/Results: No results found.  Anti-infectives: Anti-infectives     Start     Dose/Rate Route Frequency Ordered Stop   04/10/12 0000   piperacillin-tazobactam (ZOSYN) IVPB 3.375 g        3.375 g 12.5 mL/hr over 240 Minutes Intravenous 3 times per day 04/09/12 2345     04/09/12 2115   piperacillin-tazobactam (ZOSYN) IVPB 3.375 g        3.375 g 100 mL/hr over 30 Minutes Intravenous  Once 04/09/12 2114 04/09/12 2239          Assessment/Plan: s/p Procedure(s): EXPLORATORY LAPAROTOMY ILEOSTOMY CYSTOSCOPY WITH RETROGRADE PYELOGRAM, URETEROSCOPY AND STENT PLACEMENT PAS 1.  Continue ABX therapy, WBC trending down, recheck in AM 2.  Urine output is improving but will leave foley one more day to monitor output. 3.  Acute blood loss anemia - hgb 7.1 yesterday, will recheck tomorrow.  4.  Pain control good with IV morphine, once tolerating POs will begin po analgesics. 5.  Ostomy with good output, will D/C NGT and start clear liquids. 6.  Out of bed. PT to help with mobilization  LOS: 11 days   Breeze Berringer 8:45 AM 04/20/2012

## 2012-04-20 NOTE — Progress Notes (Signed)
Nutrition Follow-up  Diet Order:  Clear liquids  Pt 4 days s/p ex lap for resection of inflammatory mass, chronic fecal impaction, and subtotal colectomy with closure of rectal stump and ileostomy.  Per MD op notes, pt now has 15 feet of small bowel remaining with no evidence of Crohn's disease in the proximal small bowel.  Pt with increased pain and decreased UOP post-op.  NGT clamped (6/10) with tolerance.  Tolerating ice chips. PT with output from ileostomy. 725 mL output overnight.  Pt has advanced diet to clear liquids; awaiting lunch.  Continues Clinimix 5/20 @ 80 mL/hr with 20% IV lipids MWF @ 10 mL/hr which provides 1895 kcal, 96g protein and meets 100% pt estimated needs.  Meds: Scheduled Meds:   . antiseptic oral rinse  15 mL Mouth Rinse q12n4p  . chlorhexidine  15 mL Mouth Rinse BID  . digoxin  0.125 mg Intravenous Daily  . heparin  5,000 Units Subcutaneous Q8H  . insulin aspart  0-9 Units Subcutaneous Q6H  . metoprolol  5 mg Intravenous Q8H  . pantoprazole (PROTONIX) IV  40 mg Intravenous QHS  . piperacillin-tazobactam (ZOSYN)  IV  3.375 g Intravenous Q8H  . sodium phosphate  Dextrose 5% IVPB  15 mmol Intravenous Once   Continuous Infusions:   . 0.9 % NaCl with KCl 20 mEq / L 20 mL/hr at 04/20/12 1012  . fat emulsion 240 mL (04/19/12 1749)  . TPN (CLINIMIX) +/- additives 80 mL/hr at 04/18/12 1711  . TPN (CLINIMIX) +/- additives 80 mL/hr at 04/19/12 1749  . TPN (CLINIMIX) +/- additives     PRN Meds:.chlorproMAZINE (THORAZINE) IV, morphine injection, ondansetron (ZOFRAN) IV, ondansetron, sodium chloride  Labs:  CMP     Component Value Date/Time   NA 135 04/19/2012 0411   K 4.0 04/19/2012 0411   CL 109 04/19/2012 0411   CO2 21 04/19/2012 0411   GLUCOSE 154* 04/19/2012 0411   BUN 14 04/19/2012 0411   CREATININE 0.70 04/19/2012 0411   CALCIUM 8.3* 04/19/2012 0411   PROT 4.3* 04/19/2012 0411   ALBUMIN 1.5* 04/19/2012 0411   AST 15 04/19/2012 0411   ALT 7 04/19/2012 0411   ALKPHOS 57 04/19/2012 0411   BILITOT 0.3 04/19/2012 0411   GFRNONAA >90 04/19/2012 0411   GFRAA >90 04/19/2012 0411     Intake/Output Summary (Last 24 hours) at 04/20/12 1151 Last data filed at 04/20/12 0900  Gross per 24 hour  Intake  471.5 ml  Output   2780 ml  Net -2308.5 ml    Weight Status:  154 lbs, no edema noted in chart, likely related to fluid status Admission wt: 146 lbs  Re-estimated needs:  1870-2090 kcal, 89-104g  Nutrition Dx:  Inadequate oral intake, ongoing.  Pt has started diet initiation/advancement process with trial of clear liquids.  Intervention:   1.  Parenteral nutrition; continued management per PharmD.  Pt to continue to meet 100% estimated needs with TPN.  Pt received TPN and clear liquids for 4 days prior to surgery for improvement in nutrition status. 2. Modify diet; per MD discretion based on pt tolerance.  Adequate provision of nutrition prior to surgery and maintenance of clear liquids for gut integrity will hopefully facilitate advancement process.   Monitor:  1. Food/Beverage; pt tolerating clear liquids and consuming as desired. Met, continue.  Pt re-initiating diet for the first time post-op today. 2. Parenteral nutrition; pt to meet 90-100% estimated needs with TPN. Met, continue  Hoyt Koch Pager #:  [phone] 

## 2012-04-20 NOTE — Evaluation (Signed)
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 Underwent Exploratory laparotomy, resection of ileal inflammatory mass, subtotal colectomy with closure of rectal stump and [place] ileostomy on 04/16/12. Needs skilled PT to maximize I and safety so pt can return home with wife.    PT Assessment  Patient needs continued PT services    Follow Up Recommendations  No PT follow up    Barriers to Discharge        lEquipment Recommendations  Other (comment) (TBA closer to DC)    Recommendations for Other Services     Frequency Min 3X/week    Precautions / Restrictions Precautions Precautions: Fall   Pertinent Vitals/Pain N/A      Mobility  Transfers Transfers: Sit to Stand;Stand to Sit Sit to Stand: 4: Min guard;With upper extremity assist;With armrests;From chair/3-in-1 Stand to Sit: 4: Min guard;With upper extremity assist;With armrests;To chair/3-in-1 Details for Transfer Assistance: Verbal cues for hand placement Ambulation/Gait Ambulation/Gait Assistance: 4: Min guard Ambulation Distance (Feet): 120 Feet Assistive device: Rolling walker Ambulation/Gait Assistance Details: Initial verbal cues to stand more erect. Gait Pattern: Decreased stride length;Step-through pattern;Narrow base of support;Trunk flexed    Exercises     PT Diagnosis: Difficulty walking;Generalized weakness  PT Problem List: Decreased strength;Decreased activity tolerance;Decreased balance;Decreased mobility;Decreased knowledge of use of DME PT Treatment Interventions: DME instruction;Gait training;Stair training;Functional mobility training;Therapeutic activities;Therapeutic exercise;Balance training;Patient/family education   PT  Goals Acute Rehab PT Goals PT Goal Formulation: With patient Time For Goal Achievement: 04/27/12 Potential to Achieve Goals: Good Pt will go Supine/Side to Sit: with modified independence;with HOB 0 degrees PT Goal: Supine/Side to Sit - Progress: Goal set today Pt will go Sit to Supine/Side: with modified independence;with HOB 0 degrees PT Goal: Sit to Supine/Side - Progress: Goal set today Pt will go Sit to Stand: with modified independence PT Goal: Sit to Stand - Progress: Goal set today Pt will go Stand to Sit: with modified independence PT Goal: Stand to Sit - Progress: Goal set today Pt will Ambulate: >150 feet;with modified independence;with least restrictive assistive device PT Goal: Ambulate - Progress: Goal set today Pt will Go Up / Down Stairs: 3-5 stairs;with supervision;with rail(s) PT Goal: Up/Down Stairs - Progress: Goal set today  Visit Information  Last PT Received On: 04/20/12 Assistance Needed: +1    Subjective Data  Subjective: Pt states he has an anniversary soon. Patient Stated Goal: Be able to get around at home.   Prior Functioning  Home Living Lives With: Spouse Available Help at Discharge: Family;Available 24 hours/day Type of Home: House Home Access: Stairs to enter [employer] of Steps: 8 Entrance Stairs-Rails: Right;Left;Can reach both Home Layout: Two level;Able to live on main level with bedroom/bathroom Bathroom Shower/Tub: Engineer, manufacturing systems: Standard Home Adaptive Equipment: None Prior Function Level of Independence: Independent Able to Take Stairs?: Reciprically Driving: Yes Vocation: Retired Musician: No difficulties    Cognition  Overall Cognitive Status: Appears within functional limits for tasks assessed/performed Arousal/Alertness: Awake/alert Orientation Level: Appears intact for tasks assessed Behavior During Session: Flat affect    Extremity/Trunk Assessment Right Lower Extremity  Assessment RLE ROM/Strength/Tone: Deficits RLE ROM/Strength/Tone Deficits: grossly 4+/5 Left Lower Extremity Assessment LLE ROM/Strength/Tone: Deficits LLE ROM/Strength/Tone Deficits: grossly 4+/5   Balance Static Standing Balance Static Standing -  Balance Support: Bilateral upper extremity supported (on walker) Static Standing - Level of Assistance: 5: Stand by assistance  End of Session PT - End of Session Equipment Utilized During Treatment: Other (comment) (No gait belt due to abdominal incisions, drains, and ostomy) Activity Tolerance: Patient tolerated treatment well Patient left: in chair;with call bell/phone within reach Nurse Communication: Mobility status   Zayonna Ayuso 04/20/2012, 8:22 AM  [facility] PT [phone]

## 2012-04-20 NOTE — Progress Notes (Signed)
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  Vital Signs: Temp: 98.6 F (37 C) (06/11 0559) Temp src: Axillary (06/11 0559) BP: 159/58 mmHg (06/11 0559) Pulse Rate: 75  (06/11 0559) Intake/Output from previous day: 06/10 0701 - 06/11 0700 In: 1249.5 [I.V.:230; NG/GT:150; IV Piggyback:209.5; TPN:560] Out: 2310 [Urine:1450; Drains:135; Stool:725] Intake/Output from this shift: Total I/O In: 90 [Other:90] Out: -  Labs:  Basename 04/19/12 0411 04/18/12 0905 04/18/12 0630  WBC 12.7* -- 13.7*  HGB 7.1* 6.4* 6.4*  HCT 21.3* 19.6* 19.7*  PLT 238 -- 267  APTT -- -- --  INR -- -- --    Basename 04/19/12 0411 04/18/12 0630  NA 135 137  K 4.0 4.3  CL 109 111  CO2 21 21  GLUCOSE 154* 133*  BUN 14 19  CREATININE 0.70 0.91  LABCREA -- --  CREAT24HRUR -- --  CALCIUM 8.3* 8.1*  MG 2.0 --  PHOS 1.7* --  PROT 4.3* --  ALBUMIN 1.5* --  AST 15 --  ALT 7 --  ALKPHOS 57 --  BILITOT 0.3 --  BILIDIR -- --  IBILI -- --  PREALBUMIN 6.3* --  TRIG 103 --  CHOLHDL -- --  CHOL 60 --   Estimated Creatinine Clearance: 82.6 ml/min (by C-G formula based on Cr of 0.7).    Basename 04/20/12 0548 04/19/12 2351 04/19/12 1758  GLUCAP 154* 159* 163*   Insulin Requirements in the past 24 hours:  4 units SSI,  no insulin in TPN BAG  Current Nutrition:  Clinimix E5/20 at 39ml/hr (at goal) (provides weekly average of 1895 kcal and 96 g protein per day)   Nutritional Goals:  1850-2100 kCal, 85-105 grams of protein per day  Assessment: 71 yo M w/ 35 year h/o Crohn's disease, admitted following 5 month h/o of intermittent abdominal pain.  TPN started  pre-op, he is now s/p exlap, resection of ileal mass, subtotal colectomy and ileostomy + bilateral ureterogram and ureteral catheters.    GI NPO except ice chips.  NGT clamped to see if tolerated. Hgb low @ 7.1.   Endo No h/o DM - CBGs slightly elevated 154-170, receiving minimal insulin via SSI  Lytes/Renal No AM labs 6/11.  Scr stable, UOP good. IVF reduced to [facility] to limit pos balance. Phos low -replaced, other lytes WNL.  Hepatobil LFTs WNl, Trig and Chol WNL. Prealbumin trending down, low @ 6.3.    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  Cardiology Hx PAF - no coumadin PTA. BP elevated, HR ok. Continues on dig, metoprolol IV scheduled  ID Afeb, WBC trending down to 12.7,  empiric abx for abdominal process. Zosyn: 6/1>>  Px PPI IV, SQ heparin for dvt pxl   Plan:  - Continue Clinimix E5/20 at goal rate of 80 ml/hr (provides weekly average of 1895 kcal and 96 g protein per day) - MVI, TE, lipids at 10 ml/hr MWF only 2/2 national shortage -D/t clear liquid diet, will continue to monitor cBGs - F/u AM labs, hgb, pre-albumin, phos, LOT of abx - F/u post-op diet plans, and wean TPN as able.

## 2012-04-21 LAB — CBC
HCT: 22.1 % — ABNORMAL LOW (ref 39.0–52.0)
Hemoglobin: 7.4 g/dL — ABNORMAL LOW (ref 13.0–17.0)
MCH: 26.2 pg (ref 26.0–34.0)
MCHC: 33.5 g/dL (ref 30.0–36.0)
MCV: 78.4 fL (ref 78.0–100.0)
Platelets: 301 10*3/uL (ref 150–400)
RBC: 2.82 MIL/uL — ABNORMAL LOW (ref 4.22–5.81)
RDW: 16.5 % — ABNORMAL HIGH (ref 11.5–15.5)
WBC: 12 10*3/uL — ABNORMAL HIGH (ref 4.0–10.5)

## 2012-04-21 LAB — GLUCOSE, CAPILLARY

## 2012-04-21 LAB — BASIC METABOLIC PANEL
Calcium: 8.1 mg/dL — ABNORMAL LOW (ref 8.4–10.5)
Chloride: 102 mEq/L (ref 96–112)
Creatinine, Ser: 0.6 mg/dL (ref 0.50–1.35)
GFR calc Af Amer: >90 mL/min (ref >90)

## 2012-04-21 LAB — PHOSPHORUS: Phosphorus: 2.4 mg/dL (ref 2.3–4.6)

## 2012-04-21 MED ORDER — FAT EMULSION 20 % IV EMUL
240.0000 mL | INTRAVENOUS | Status: AC
  Administered 2012-04-21: 240 mL via INTRAVENOUS
  Filled 2012-04-21: qty 250

## 2012-04-21 MED ORDER — INSULIN ASPART 100 UNIT/ML ~~LOC~~ SOLN
0.0000 [IU] | Freq: Every day | SUBCUTANEOUS | Status: DC
  Administered 2012-04-22: 2 [IU] via SUBCUTANEOUS

## 2012-04-21 MED ORDER — ZINC TRACE METAL 1 MG/ML IV SOLN
INTRAVENOUS | Status: AC
  Administered 2012-04-21: 18:00:00 via INTRAVENOUS
  Filled 2012-04-21: qty 2000

## 2012-04-21 MED ORDER — INSULIN ASPART 100 UNIT/ML ~~LOC~~ SOLN
0.0000 [IU] | Freq: Three times a day (TID) | SUBCUTANEOUS | Status: DC
  Administered 2012-04-21: 3 [IU] via SUBCUTANEOUS
  Administered 2012-04-21: 2 [IU] via SUBCUTANEOUS
  Administered 2012-04-22 – 2012-04-23 (×6): 3 [IU] via SUBCUTANEOUS
  Administered 2012-04-24: 2 [IU] via SUBCUTANEOUS

## 2012-04-21 NOTE — Progress Notes (Signed)
Physical Therapy Treatment Patient Details Name: Nicholas [name] MRN: [id number] DOB: [birth year] Today's Date: 04/21/2012 Time: 6578-4696 PT Time Calculation (min): 22 min  PT Assessment / Plan / Recommendation Comments on Treatment Session  Pt adm with Chrohns and underwent ileostomy.  Pt making good progress and will be able to return home with wife.    Follow Up Recommendations  No PT follow up    Barriers to Discharge        Equipment Recommendations  None recommended by PT    Recommendations for Other Services    Frequency Min 3X/week   Plan Discharge plan remains appropriate;Frequency remains appropriate    Precautions / Restrictions Precautions Precautions: Fall   Pertinent Vitals/Pain N/A    Mobility  Transfers Sit to Stand: 4: Min guard Stand to Sit: 4: Min guard Details for Transfer Assistance: verbal cues for hand placement. Ambulation/Gait Ambulation/Gait Assistance: 5: Supervision Ambulation Distance (Feet): 250 Feet Assistive device: Other (Comment) (IV pole) Gait Pattern: Decreased stride length    Exercises     PT Diagnosis:    PT Problem List:   PT Treatment Interventions:     PT Goals Acute Rehab PT Goals PT Goal: Sit to Stand - Progress: Progressing toward goal PT Goal: Stand to Sit - Progress: Progressing toward goal PT Goal: Ambulate - Progress: Progressing toward goal  Visit Information  Last PT Received On: 04/21/12 Assistance Needed: +1    Subjective Data  Subjective: Pt states he feels better today.   Cognition  Overall Cognitive Status: Appears within functional limits for tasks assessed/performed Arousal/Alertness: Awake/alert Orientation Level: Appears intact for tasks assessed Behavior During Session: [facility] for tasks performed    Balance  Static Standing Balance Static Standing - Balance Support: Right upper extremity supported Static Standing - Level of Assistance: 5: Stand by assistance  End of Session PT - End of  Session Activity Tolerance: Patient tolerated treatment well Patient left: in chair;with call bell/phone within reach;with family/visitor present Nurse Communication: Mobility status    Nicholas [name] 04/21/2012, 2:31 PM  [facility] PT [phone]

## 2012-04-21 NOTE — Progress Notes (Signed)
Tolerating some clears today I also spoke to his wife and daughter Patient examined and I agree with the assessment and plan  Violeta Gelinas, MD, MPH, FACS Pager: [phone]  04/21/2012 5:51 PM

## 2012-04-21 NOTE — Progress Notes (Signed)
Patient ID: Nicholas [name], male   DOB: [birth year], 71 y.o.   MRN: [id number] Patient ID: Nicholas [name], male   DOB: [birth year], 71 y.o.   MRN: [id number] Patient ID: Nicholas [name], male   DOB: December 25, 1940, 71 y.o.   MRN: [id number] Patient ID: Nicholas [name], male   DOB: Jul 16, 1941, 71 y.o.   MRN: [id number] CCS Progress Note 5 Days Post-Op  Subjective: Pt up eating breakfast, pain well controlled, denies n/v at current but did have some last night.  +output from ileostomy.  No complaints otherwise.   Objective: Vital signs in last 24 hours: Temp:  [98.2 F (36.8 C)-98.8 F (37.1 C)] 98.2 F (36.8 C) (06/12 0533) Pulse Rate:  [73-86] 73  (06/12 0533) Resp:  [15-18] 18  (06/12 0533) BP: (137-155)/(56-70) 142/61 mmHg (06/12 0533) SpO2:  [98 %-99 %] 98 % (06/12 0533) Last BM Date: 04/15/12  Intake/Output from previous day: 06/11 0701 - 06/12 0700 In: 2800 [P.O.:240; I.V.:488; IV Piggyback:50; TPN:2022] Out: 1820 [Urine:1275; Drains:445; Stool:100] Intake/Output this shift:    General: No distress.  Looks comfortable.    Lungs:   CTA bilateral.  Abd: Soft, flat, appropriately tender.  Drain with serosang output.   RUQ ostomy is pink with liquid output.  Incision with some bloody drainage  Extremities: no edema.   Lab Results:  CBC    Component Value Date/Time   WBC 12.0* 04/21/2012 0630   RBC 2.82* 04/21/2012 0630   HGB 7.4* 04/21/2012 0630   HCT 22.1* 04/21/2012 0630   PLT 301 04/21/2012 0630   MCV 78.4 04/21/2012 0630   MCH 26.2 04/21/2012 0630   MCHC 33.5 04/21/2012 0630   RDW 16.5* 04/21/2012 0630   LYMPHSABS 0.4* 04/19/2012 0411   MONOABS 0.3 04/19/2012 0411   EOSABS 0.0 04/19/2012 0411   BASOSABS 0.0 04/19/2012 0411     BMET  Basename 04/21/12 0630 04/19/12 0411  NA 132* 135  K 3.7 4.0  CL 102 109  CO2 23 21  GLUCOSE 152* 154*  BUN 12 14  CREATININE 0.60 0.70  CALCIUM 8.1* 8.3*   PT/INR No results found for this basename: LABPROT:2,INR:2 in the last 72  hours ABG No results found for this basename: PHART:2,PCO2:2,PO2:2,HCO3:2 in the last 72 hours  Studies/Results: No results found.  Anti-infectives: Anti-infectives     Start     Dose/Rate Route Frequency Ordered Stop   04/10/12 0000   piperacillin-tazobactam (ZOSYN) IVPB 3.375 g        3.375 g 12.5 mL/hr over 240 Minutes Intravenous 3 times per day 04/09/12 2345     04/09/12 2115   piperacillin-tazobactam (ZOSYN) IVPB 3.375 g        3.375 g 100 mL/hr over 30 Minutes Intravenous  Once 04/09/12 2114 04/09/12 2239          Assessment/Plan: s/p Procedure(s): EXPLORATORY LAPAROTOMY ILEOSTOMY CYSTOSCOPY WITH RETROGRADE PYELOGRAM, URETEROSCOPY AND STENT PLACEMENT PAS 1.  Continue ABX therapy, WBC trending down 2.  Urine output is improving, needs cystogram prior to foley removal. 3.  Acute blood loss anemia - hgb stable at 7.4.  4.  Pain control good with IV morphine, once tolerating POs will begin po analgesics. 5.  Ostomy with good output, continue clear liquids since he did have some nausea yesterday. 6.  Out of bed. PT to help with mobilization  LOS: 12 days   Nicholas [name] 8:44 AM 04/21/2012

## 2012-04-21 NOTE — Progress Notes (Signed)
See my note Emesis earlier Patient examined and I agree with the assessment and plan  Violeta Gelinas, MD, MPH, FACS Pager: [phone]  04/21/2012 5:52 PM

## 2012-04-21 NOTE — Progress Notes (Signed)
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  Vital Signs: Temp: 98.2 F (36.8 C) (06/12 0533) Temp src: Oral (06/12 0533) BP: 142/61 mmHg (06/12 0533) Pulse Rate: 73  (06/12 0533) Intake/Output from previous day: 06/11 0701 - 06/12 0700 In: 2800 [P.O.:240; I.V.:488; IV Piggyback:50; TPN:2022] Out: 1820 [Urine:1275; Drains:445; Stool:100] Intake/Output from this shift:   Labs:  [facility] 04/21/12 0630 04/19/12 0411  WBC 12.0* 12.7*  HGB 7.4* 7.1*  HCT 22.1* 21.3*  PLT 301 238  APTT -- --  INR -- --    Basename 04/21/12 0630 04/19/12 0411  NA 132* 135  K 3.7 4.0  CL 102 109  CO2 23 21  GLUCOSE 152* 154*  BUN 12 14  CREATININE 0.60 0.70  LABCREA -- --  CREAT24HRUR -- --  CALCIUM 8.1* 8.3*  MG -- 2.0  PHOS 2.4 1.7*  PROT -- 4.3*  ALBUMIN -- 1.5*  AST -- 15  ALT -- 7  ALKPHOS -- 57  BILITOT -- 0.3  BILIDIR -- --  IBILI -- --  PREALBUMIN -- 6.3*  TRIG -- 103  CHOLHDL -- --  CHOL -- 60   Estimated Creatinine Clearance: 82.6 ml/min (by C-G formula based on Cr of 0.6).    Basename 04/21/12 0531 04/20/12 2351 04/20/12 1806  GLUCAP 154* 152* 202*   Insulin Requirements in the past 24 hours:  7 units SSI,  no insulin in TPN BAG  Current Nutrition:  Clinimix E5/20 at 84ml/hr (at goal) (provides weekly average of 1895 kcal and 96 g protein per day)   Nutritional Goals:  1850-2100 kCal, 85-105 grams of protein per day  Assessment: 71 yo M w/ 33 year h/o Crohn's disease, admitted following 5 month h/o of intermittent abdominal pain.  TPN started pre-op, he is now s/p exlap, resection of ileal mass, subtotal colectomy and  ileostomy + bilateral ureterogram and ureteral catheters.    GI Clear liquids - only taking in 25%.  NGT d/c'd 6/11.  Ostomy with good output.  Hgb low, slightly improved.   Endo No h/o DM - CBGs elevated 152-202 after clear liquid diet started.   Lytes/Renal Na low, Phos improved. Scr stable, UOP good. IVF reduced to [facility] to limit pos balance. Other lytes WNL.  Hepatobil LFTs WNl, Trig and Chol WNL. Prealbumin trending down, low @ 6.3.    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  Cardiology Hx PAF - no coumadin PTA. BP slightly elevated, HR ok. Continues on dig, metoprolol IV scheduled  ID Afeb, WBC trending down, on empiric abx for abdominal process. Zosyn: 6/1>>  Px PPI IV, SQ heparin for dvt pxl   Plan:  - Continue Clinimix E5/20 at goal rate of 80 ml/hr (provides weekly average of 1895 kcal and 96 g protein per day) - MVI, TE, lipids at 10 ml/hr MWF only 2/2 national shortage - Increase SSI from sensitive to moderate TID/AC - F/u AM labs, mag, phos, LOT of abx - F/u post-op diet plans, and wean TPN as able.

## 2012-04-21 NOTE — Consult Note (Signed)
WOC ostomy consult  Stoma type/location: Ileostomy, right upper quadrant Stomal assessment/size: Stoma red, budded and raised above skin level, 1 1/2 in. stoma Peristomal assessment: Intact Output: 100 cc liquid brown stool Ostomy pouching: 1pc  Education provided: Discussed pouching routine, dietary guidelines as well as troubleshooting ostomy.  All questions answered.  Wife at bedside and participated in education.  Allowed to return demonstrate how to open and close pouch, did so without difficulty.  Placed on patient discharge program.  Supplies ordered.  Plans to be discharged with home health.  Placed on [facility] discharge program.  Pt could benefit form home health assistance.  Nefeteria Jeter RN, BSN, WOC Nurse/Will not plan to follow further unless re-consulted.  [address], RN, MSN, [employer]  [phone]

## 2012-04-22 LAB — COMPREHENSIVE METABOLIC PANEL
ALT: 104 U/L — ABNORMAL HIGH (ref 0–53)
AST: 60 U/L — ABNORMAL HIGH (ref 0–37)
Albumin: 1.3 g/dL — ABNORMAL LOW (ref 3.5–5.2)
Alkaline Phosphatase: 175 U/L — ABNORMAL HIGH (ref 39–117)
BUN: 12 mg/dL (ref 6–23)
CO2: 23 mEq/L (ref 19–32)
Calcium: 7.8 mg/dL — ABNORMAL LOW (ref 8.4–10.5)
Chloride: 103 mEq/L (ref 96–112)
Creatinine, Ser: 0.63 mg/dL (ref 0.50–1.35)
GFR calc Af Amer: >90 mL/min (ref >90)
GFR calc non Af Amer: >90 mL/min (ref >90)
Glucose, Bld: 166 mg/dL — ABNORMAL HIGH (ref 70–99)
Potassium: 3.6 mEq/L (ref 3.5–5.1)
Sodium: 133 mEq/L — ABNORMAL LOW (ref 135–145)
Total Bilirubin: 0.5 mg/dL (ref 0.3–1.2)
Total Protein: 4.3 g/dL — ABNORMAL LOW (ref 6.0–8.3)

## 2012-04-22 LAB — PHOSPHORUS: Phosphorus: 2.7 mg/dL (ref 2.3–4.6)

## 2012-04-22 LAB — GLUCOSE, CAPILLARY
Glucose-Capillary: 197 mg/dL — ABNORMAL HIGH (ref 70–99)
Glucose-Capillary: 175 mg/dL — ABNORMAL HIGH (ref 70–99)
Glucose-Capillary: 175 mg/dL — ABNORMAL HIGH (ref 70–99)

## 2012-04-22 LAB — MAGNESIUM: Magnesium: 1.9 mg/dL (ref 1.5–2.5)

## 2012-04-22 MED ORDER — POTASSIUM CHLORIDE 10 MEQ/50ML IV SOLN
10.0000 meq | INTRAVENOUS | Status: AC
  Administered 2012-04-22 (×4): 10 meq via INTRAVENOUS
  Filled 2012-04-22 (×4): qty 50

## 2012-04-22 MED ORDER — CLINIMIX E/DEXTROSE (5/20) 5 % IV SOLN
INTRAVENOUS | Status: AC
  Administered 2012-04-22: 18:00:00 via INTRAVENOUS
  Filled 2012-04-22: qty 2000

## 2012-04-22 MED ORDER — SIMETHICONE 80 MG PO CHEW
80.0000 mg | CHEWABLE_TABLET | Freq: Four times a day (QID) | ORAL | Status: DC | PRN
  Administered 2012-04-22: 80 mg via ORAL
  Filled 2012-04-22: qty 1

## 2012-04-22 NOTE — Progress Notes (Signed)
Physical Therapy Treatment Patient Details Name: MACIO KISSOON MRN: [id number] DOB: 03/10/1941 Today's Date: 04/22/2012 Time: 4098-1191 PT Time Calculation (min): 21 min  PT Assessment / Plan / Recommendation Comments on Treatment Session  Pt adm with Chrohns and underwent ileostomy.  Pt continues to make steady progress.    Follow Up Recommendations  No PT follow up    Barriers to Discharge        Equipment Recommendations  None recommended by PT    Recommendations for Other Services    Frequency Min 3X/week   Plan Discharge plan remains appropriate;Frequency remains appropriate    Precautions / Restrictions Precautions Precautions: Fall Restrictions Weight Bearing Restrictions: No   Pertinent Vitals/Pain N/A    Mobility  Bed Mobility Bed Mobility: Supine to Sit;Sitting - Scoot to Edge of Bed Supine to Sit: HOB elevated;4: Min assist Sitting - Scoot to Edge of Bed: 5: Supervision Transfers Sit to Stand: 5: Supervision;From bed;With upper extremity assist Stand to Sit: 5: Supervision;With upper extremity assist;With armrests;To chair/3-in-1 Ambulation/Gait Ambulation/Gait Assistance: 5: Supervision Ambulation Distance (Feet): 250 Feet Assistive device: Other (Comment) (IV pole) Gait Pattern: Decreased stride length Gait velocity: slow cadence Stairs Assistance: 5: Supervision Stair Management Technique: Two rails;Alternating pattern Number of Stairs: 3     Exercises     PT Diagnosis:    PT Problem List:   PT Treatment Interventions:     PT Goals Acute Rehab PT Goals PT Goal: Supine/Side to Sit - Progress: Progressing toward goal PT Goal: Sit to Supine/Side - Progress: Progressing toward goal PT Goal: Sit to Stand - Progress: Progressing toward goal PT Goal: Stand to Sit - Progress: Progressing toward goal PT Goal: Ambulate - Progress: Progressing toward goal PT Goal: Up/Down Stairs - Progress: Met  Visit Information  Last PT Received On:  04/22/12 Assistance Needed: +1    Subjective Data  Subjective: Pt states he doesn't feel as good as he did yesterday.   Cognition  Overall Cognitive Status: Appears within functional limits for tasks assessed/performed Arousal/Alertness: Awake/alert Orientation Level: Appears intact for tasks assessed Behavior During Session: [facility] for tasks performed    Balance  Static Standing Balance Static Standing - Balance Support: Right upper extremity supported Static Standing - Level of Assistance: 6: Modified independent (Device/Increase time)  End of Session PT - End of Session Activity Tolerance: Patient tolerated treatment well Patient left: in chair;with call bell/phone within reach;with family/visitor present Nurse Communication: Mobility status    Janelie Goltz 04/22/2012, 11:35 AM  [facility] PT [phone]

## 2012-04-22 NOTE — Progress Notes (Signed)
Tolerating about 1/2 of full liquid trays Improving I spoke to his family as well Patient examined and I agree with the assessment and plan  Violeta Gelinas, MD, MPH, FACS Pager: [phone]  04/22/2012 4:30 PM

## 2012-04-22 NOTE — Progress Notes (Signed)
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  Vital Signs: Temp: 98.2 F (36.8 C) (06/13 0642) Temp src: Oral (06/13 0642) BP: 118/90 mmHg (06/13 0642) Pulse Rate: 85  (06/13 0642) Intake/Output from previous day: 06/12 0701 - 06/13 0700 In: 3415.7 [P.O.:240; I.V.:607; IV Piggyback:50; TPN:2518.7] Out: 2075 [Urine:1375; Drains:425; Stool:275] Intake/Output from this shift:   Labs:  [facility] 04/21/12 0630  WBC 12.0*  HGB 7.4*  HCT 22.1*  PLT 301  APTT --  INR --    Basename 04/22/12 0517 04/21/12 0630  NA 133* 132*  K 3.6 3.7  CL 103 102  CO2 23 23  GLUCOSE 166* 152*  BUN 12 12  CREATININE 0.63 0.60  LABCREA -- --  CREAT24HRUR -- --  CALCIUM 7.8* 8.1*  MG 1.9 --  PHOS 2.7 2.4  PROT 4.3* --  ALBUMIN 1.3* --  AST 60* --  ALT 104* --  ALKPHOS 175* --  BILITOT 0.5 --  BILIDIR -- --  IBILI -- --  PREALBUMIN -- --  TRIG -- --  CHOLHDL -- --  CHOL -- --   Estimated Creatinine Clearance: 82.6 ml/min (by C-G formula based on Cr of 0.63).    Basename 04/21/12 2157 04/21/12 1628 04/21/12 1212  GLUCAP 149* 156* 142*   Insulin Requirements in the past 24 hours:  3 units SSI,  no insulin in TPN BAG  Current Nutrition:  Clinimix E5/20 at 42ml/hr (at goal) (provides weekly average of 1895 kcal and 96 g protein per day)   Nutritional Goals:  1850-2100 kCal, 85-105 grams of protein per day  Assessment: 71 yo M w/ 33 year h/o Crohn's disease, admitted following 5 month h/o of intermittent abdominal pain.  TPN started pre-op, he is now s/p exlap, resection of ileal mass, subtotal colectomy and ileostomy + bilateral ureterogram  and ureteral catheters.    GI Advanced to full liquid diet.  Ostomy with good output.  Hgb low, slightly improved.   Endo No h/o DM -cBGs improved with increase to moderate SSI, advanced to full liquid diet.   Lytes/Renal Na low, improving. K, mag trending down. Other lytes WNL.  Scr stable, UOP good. IVFs at [facility].   Hepatobil LFTs WNl, Trig and Chol WNL. Prealbumin, albumin trending down.     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  Cardiology Hx PAF - no coumadin PTA. VSS. Continues on dig, metoprolol IV scheduled  ID Afeb, WBC trending down, on empiric abx for abdominal process. Zosyn: 6/1>>  Px PPI IV, SQ heparin for dvt pxl   Plan:  - Continue Clinimix E5/20 at goal rate of 80 ml/hr (provides weekly average of 1895 kcal and 96 g protein per day) - MVI, TE, lipids at 10 ml/hr MWF only 2/2 national shortage - Potassium 10 mEq IV q 1 hr x 4  - F/u cBGs with full liquid diet - F/u AM labs, mag, K, LOT of abx - F/u post-op diet plans, and wean TPN as able.  [name], PharmD 04/22/2012 9:10 AM  Pager: [phone]

## 2012-04-22 NOTE — Progress Notes (Signed)
6 Days Post-Op  Subjective: Sitting up in chair at bedside this am, tolerating clear liquids well, does c/o of frequent belching, but no nausea, emesis. He has been able to ambulate in hallways. "Still sore form surgery, and does have some LBP.   Objective: Vital signs in last 24 hours: Temp:  [98.2 F (36.8 C)-98.6 F (37 C)] 98.2 F (36.8 C) (06/13 1610) Pulse Rate:  [76-100] 85  (06/13 0642) Resp:  [18] 18  (06/13 0642) BP: (118-131)/(56-90) 118/90 mmHg (06/13 0642) SpO2:  [98 %-100 %] 98 % (06/13 0642) Last BM Date: 04/21/12  Intake/Output from previous day: 06/12 0701 - 06/13 0700 In: 3415.7 [P.O.:240; I.V.:607; IV Piggyback:50; TPN:2518.7] Out: 2075 [Urine:1375; Drains:425; Stool:275] Intake/Output this shift:    General appearance: alert, cooperative, appears stated age and no distress Illeostomy stump appears to be well perfused, color is good, draining dark thin liquid. Abdomen is generally tender to touch, BS are present, non distended.  Lab Results:   Basename 04/21/12 0630  WBC 12.0*  HGB 7.4*  HCT 22.1*  PLT 301   BMET  Basename 04/22/12 0517 04/21/12 0630  NA 133* 132*  K 3.6 3.7  CL 103 102  CO2 23 23  GLUCOSE 166* 152*  BUN 12 12  CREATININE 0.63 0.60  CALCIUM 7.8* 8.1*   PT/INR No results found for this basename: LABPROT:2,INR:2 in the last 72 hours ABG No results found for this basename: PHART:2,PCO2:2,PO2:2,HCO3:2 in the last 72 hours  Studies/Results: No results found.  Anti-infectives: Anti-infectives     Start     Dose/Rate Route Frequency Ordered Stop   04/10/12 0000   piperacillin-tazobactam (ZOSYN) IVPB 3.375 g        3.375 g 12.5 mL/hr over 240 Minutes Intravenous 3 times per day 04/09/12 2345     04/09/12 2115   piperacillin-tazobactam (ZOSYN) IVPB 3.375 g        3.375 g 100 mL/hr over 30 Minutes Intravenous  Once 04/09/12 2114 04/09/12 2239          Assessment/Plan: s/p Procedure(s) (LRB): EXPLORATORY LAPAROTOMY  (N/A) ILEOSTOMY (N/A) CYSTOSCOPY WITH RETROGRADE PYELOGRAM, URETEROSCOPY AND STENT PLACEMENT (Bilateral) Advance diet to full liquids. Continue to encourage ambulation.  LOS: 13 days    Praise Dolecki 04/22/2012

## 2012-04-23 LAB — GLUCOSE, CAPILLARY
Glucose-Capillary: 196 mg/dL — ABNORMAL HIGH (ref 70–99)
Glucose-Capillary: 172 mg/dL — ABNORMAL HIGH (ref 70–99)
Glucose-Capillary: 122 mg/dL — ABNORMAL HIGH (ref 70–99)

## 2012-04-23 LAB — BASIC METABOLIC PANEL
Calcium: 7.8 mg/dL — ABNORMAL LOW (ref 8.4–10.5)
GFR calc non Af Amer: >90 mL/min (ref >90)
Sodium: 130 mEq/L — ABNORMAL LOW (ref 135–145)

## 2012-04-23 MED ORDER — FAT EMULSION 20 % IV EMUL
240.0000 mL | INTRAVENOUS | Status: AC
  Administered 2012-04-23: 240 mL via INTRAVENOUS
  Filled 2012-04-23: qty 250

## 2012-04-23 MED ORDER — SELENIUM 40 MCG/ML IV SOLN
INTRAVENOUS | Status: AC
  Administered 2012-04-23: 18:00:00 via INTRAVENOUS
  Filled 2012-04-23: qty 1000

## 2012-04-23 MED ORDER — CYCLOBENZAPRINE HCL 10 MG PO TABS
5.0000 mg | ORAL_TABLET | Freq: Three times a day (TID) | ORAL | Status: DC | PRN
  Administered 2012-04-23 – 2012-04-25 (×6): 5 mg via ORAL
  Filled 2012-04-23 (×2): qty 1
  Filled 2012-04-23 (×2): qty 2
  Filled 2012-04-23 (×2): qty 1

## 2012-04-23 NOTE — Progress Notes (Signed)
7 Days Post-Op  Subjective: Feels great this am, still with chronic LBP, but even this is better. Slept well last pm. Up in chair at bedside this am, eating full liquid diet and tolerating this well without nausea or emesis. Less burping this am as well.  Objective: Vital signs in last 24 hours: Temp:  [98 F (36.7 C)-99.1 F (37.3 C)] 98 F (36.7 C) (06/14 0618) Pulse Rate:  [81-106] 106  (06/14 0618) Resp:  [16-18] 18  (06/14 0618) BP: (124-126)/(52-64) 125/59 mmHg (06/14 0618) SpO2:  [99 %-100 %] 100 % (06/14 0618) Last BM Date: 04/22/12  Intake/Output from previous day: 06/13 0701 - 06/14 0700 In: 3571 [P.O.:240; I.V.:539.1; TPN:2791.8] Out: 1990 [Urine:1325; Drains:405; Stool:260] Intake/Output this shift:    General appearance: alert, cooperative and no distress Chest: CTA bilaterally Abdomen: Illeostomy stump appears to be well perfused, color is good, draining dark thin liquid.  Abdomen is generally tender to touch, BS are present, non distended.   Lab Results:   Basename 04/21/12 0630  WBC 12.0*  HGB 7.4*  HCT 22.1*  PLT 301   BMET  Basename 04/23/12 0220 04/22/12 0517  NA 130* 133*  K 4.0 3.6  CL 101 103  CO2 24 23  GLUCOSE 152* 166*  BUN 11 12  CREATININE 0.65 0.63  CALCIUM 7.8* 7.8*   PT/INR No results found for this basename: LABPROT:2,INR:2 in the last 72 hours ABG No results found for this basename: PHART:2,PCO2:2,PO2:2,HCO3:2 in the last 72 hours  Studies/Results: No results found.  Anti-infectives: Anti-infectives     Start     Dose/Rate Route Frequency Ordered Stop   04/10/12 0000   piperacillin-tazobactam (ZOSYN) IVPB 3.375 g        3.375 g 12.5 mL/hr over 240 Minutes Intravenous 3 times per day 04/09/12 2345     04/09/12 2115   piperacillin-tazobactam (ZOSYN) IVPB 3.375 g        3.375 g 100 mL/hr over 30 Minutes Intravenous  Once 04/09/12 2114 04/09/12 2239          Assessment/Plan: s/p Procedure(s) (LRB): EXPLORATORY  LAPAROTOMY (N/A) ILEOSTOMY (N/A) CYSTOSCOPY WITH RETROGRADE PYELOGRAM, URETEROSCOPY AND STENT PLACEMENT (Bilateral) 1.Advance diet, possibly tomorrow if he continues to do well with full liquids today. 2. 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

## 2012-04-23 NOTE — Progress Notes (Signed)
Clears today, likely can advance in AM Patient examined and I agree with the assessment and plan  Violeta Gelinas, MD, MPH, FACS Pager: [phone]  04/23/2012 5:09 PM

## 2012-04-23 NOTE — Progress Notes (Signed)
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  Vital Signs: Temp: 98 F (36.7 C) (06/14 0618) Temp src: Oral (06/14 0618) BP: 125/59 mmHg (06/14 0618) Pulse Rate: 106  (06/14 0618) Intake/Output from previous day: 06/13 0701 - 06/14 0700 In: 3571 [P.O.:240; I.V.:539.1; TPN:2791.8] Out: 1990 [Urine:1325; Drains:405; Stool:260] Intake/Output from this shift:   Labs:  Basename 04/21/12 0630  WBC 12.0*  HGB 7.4*  HCT 22.1*  PLT 301  APTT --  INR --    Basename 04/23/12 0220 04/22/12 0517 04/21/12 0630  NA 130* 133* 132*  K 4.0 3.6 3.7  CL 101 103 102  CO2 24 23 23   GLUCOSE 152* 166* 152*  BUN 11 12 12   CREATININE 0.65 0.63 0.60  LABCREA -- -- --  CREAT24HRUR -- -- --  CALCIUM 7.8* 7.8* 8.1*  MG -- 1.9 --  PHOS -- 2.7 2.4  PROT -- 4.3* --  ALBUMIN -- 1.3* --  AST -- 60* --  ALT -- 104* --  ALKPHOS -- 175* --  BILITOT -- 0.5 --  BILIDIR -- -- --  IBILI -- -- --  PREALBUMIN -- -- --  TRIG -- -- --  CHOLHDL -- -- --  CHOL -- -- --   Estimated Creatinine Clearance: 82.6 ml/min (by C-G formula based on Cr of 0.65).    Basename 04/23/12 0737 04/22/12 2205 04/22/12 1730  GLUCAP 196* 149* 175*   Insulin Requirements in the past 24 hours:  5 units SSI,  no insulin in TPN BAG  Current Nutrition:  Clinimix E5/20 at 30ml/hr (at goal) (provides weekly average of 1895 kcal and 96 g protein per day)   Nutritional Goals:  1850-2100 kCal, 85-105 grams of protein per day  Assessment: 71 yo M w/ 33 year h/o Crohn's disease, admitted following 5 month h/o of intermittent abdominal pain.  TPN started pre-op, he is now s/p exlap,  resection of ileal mass, subtotal colectomy and ileostomy + bilateral ureterogram and ureteral catheters.    GI Advanced to full liquid diet -tolerating 50% of trays.  Plans for possible further advancement of diet 6/15 if continues to do well.  Ostomy with good output.  Hgb low, slightly improved.   Endo No h/o DM -cBGs uncontrolled 149-197 after advancement to full liquid diet. On moderate SSI.   Lytes/Renal Na remains low, other lytes WNL.  Scr stable, UOP good. IVFs at [facility].  Wt up 8 lb since admission.  Pos balance.    Hepatobil LFTs WNl, Trig and Chol WNL. Prealbumin, albumin trending down.     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 VSS. Continues on dig, metoprolol IV scheduled  ID Afeb, WBC trending down, on empiric abx for abdominal process. Zosyn: 6/1>>  Px PPI IV, SQ heparin for dvt pxl   Plan:  - Decrease Clinimix E5/20 to 1/2 rate of 40 ml/hr  - MVI, TE, lipids at 5 ml/hr MWF only 2/2 national shortage - F/u cBGs with full liquid diet, decrease in TPN rate - F/u AM labs, LOT of abx - F/u post-op diet plans, and wean  TPN as able  Nicholas [name], PharmD 04/23/2012 8:30 AM  Pager: [phone]

## 2012-04-23 NOTE — Progress Notes (Signed)
Nutrition Follow-up  Diet Order:  Full liquids  Pt advanced to full liquids today.  Reports tolerance with decreased nausea, no abdominal pain, and increased output from ileostomy.   Pt feels indifferent about diet advancement, but did state he is anticipating resuming some of his favorite foods as he has been on clear liquids only for ~3 weeks. Preferences noted in [employer].  Pt continues Clinimix 5/20 @ 80 mL/hr with IV lipids @ 5 mL/hr MWF which provides 1792 kcal, 96g protein on average due to national lipid shortage. Family at bedside reports plan is to decrease TPN by 40% tomorrow.  Orders for TPN @ 40 mL/hr tomorrow.  Meds: Scheduled Meds:   . digoxin  0.125 mg Intravenous Daily  . heparin  5,000 Units Subcutaneous Q8H  . insulin aspart  0-15 Units Subcutaneous TID WC  . insulin aspart  0-5 Units Subcutaneous QHS  . metoprolol  5 mg Intravenous Q8H  . pantoprazole (PROTONIX) IV  40 mg Intravenous QHS  . piperacillin-tazobactam (ZOSYN)  IV  3.375 g Intravenous Q8H   Continuous Infusions:   . 0.9 % NaCl with KCl 20 mEq / L 20 mL/hr at 04/22/12 1340  . fat emulsion 240 mL (04/21/12 1733)  . fat emulsion    . TPN (CLINIMIX) +/- additives 80 mL/hr at 04/21/12 1734  . TPN (CLINIMIX) +/- additives 80 mL/hr at 04/22/12 1734  . TPN (CLINIMIX) +/- additives     PRN Meds:.chlorproMAZINE (THORAZINE) IV, cyclobenzaprine, morphine injection, ondansetron (ZOFRAN) IV, ondansetron, simethicone, sodium chloride  Labs:  CMP     Component Value Date/Time   NA 130* 04/23/2012 0220   K 4.0 04/23/2012 0220   CL 101 04/23/2012 0220   CO2 24 04/23/2012 0220   GLUCOSE 152* 04/23/2012 0220   BUN 11 04/23/2012 0220   CREATININE 0.65 04/23/2012 0220   CALCIUM 7.8* 04/23/2012 0220   PROT 4.3* 04/22/2012 0517   ALBUMIN 1.3* 04/22/2012 0517   AST 60* 04/22/2012 0517   ALT 104* 04/22/2012 0517   ALKPHOS 175* 04/22/2012 0517   BILITOT 0.5 04/22/2012 0517   GFRNONAA >90 04/23/2012 0220   GFRAA >90  04/23/2012 0220     Intake/Output Summary (Last 24 hours) at 04/23/12 1525 Last data filed at 04/23/12 1435  Gross per 24 hour  Intake 3875.62 ml  Output   2045 ml  Net 1830.62 ml    Weight Status:  146-154 lbs  Re-estimated needs:  1870-2090 kcal, 89-104g  Nutrition Dx:  Inadequate oral intake, ongoing  Intervention:   1.  Parenteral nutrition; continued management per PharmD.  Recommend pt to continue to meet >50% of needs with TPN until pt able to tolerate solid foods  Monitor:   1. Food/Beverage; pt tolerating full liquids and consuming as desired. Met, continue.  2. Parenteral nutrition; pt to meet 90-100% estimated needs with TPN. Met, continue.  New goal of pt meeting ~50% of needs during diet advancement process.  Hoyt Koch Pager #:  [phone]

## 2012-04-24 LAB — GLUCOSE, CAPILLARY
Glucose-Capillary: 118 mg/dL — ABNORMAL HIGH (ref 70–99)
Glucose-Capillary: 151 mg/dL — ABNORMAL HIGH (ref 70–99)

## 2012-04-24 MED ORDER — CLINIMIX E/DEXTROSE (5/20) 5 % IV SOLN
INTRAVENOUS | Status: AC
  Administered 2012-04-24: 18:00:00 via INTRAVENOUS
  Filled 2012-04-24: qty 1000

## 2012-04-24 NOTE — Progress Notes (Signed)
8 Days Post-Op  Subjective: Tolerating full liquids.  No nausea. Pain controlled  Objective: Vital signs in last 24 hours: Temp:  [98 F (36.7 C)-98.2 F (36.8 C)] 98.2 F (36.8 C) (06/15 0525) Pulse Rate:  [82-103] 82  (06/15 0525) Resp:  [16-19] 19  (06/15 0525) BP: (136-147)/(51-58) 136/58 mmHg (06/15 0525) SpO2:  [97 %-99 %] 99 % (06/15 0525) Last BM Date: 04/23/12  Intake/Output from previous day: 06/14 0701 - 06/15 0700 In: 2816.9 [P.O.:480; I.V.:460; IV Piggyback:150; TPN:1726.9] Out: 3460 [Urine:2600; Drains:110; Stool:750] Intake/Output this shift: Total I/O In: -  Out: 250 [Stool:250]  General appearance: alert, cooperative and no distress GI: soft, nt, ND, wound without infection, packing and nu gauze removed, ostomy looks good  Lab Results:  No results found for this basename: WBC:2,HGB:2,HCT:2,PLT:2 in the last 72 hours BMET  [facility] 04/23/12 0220 04/22/12 0517  NA 130* 133*  K 4.0 3.6  CL 101 103  CO2 24 23  GLUCOSE 152* 166*  BUN 11 12  CREATININE 0.65 0.63  CALCIUM 7.8* 7.8*   PT/INR No results found for this basename: LABPROT:2,INR:2 in the last 72 hours ABG No results found for this basename: PHART:2,PCO2:2,PO2:2,HCO3:2 in the last 72 hours  Studies/Results: No results found.  Anti-infectives: Anti-infectives     Start     Dose/Rate Route Frequency Ordered Stop   04/10/12 0000   piperacillin-tazobactam (ZOSYN) IVPB 3.375 g        3.375 g 12.5 mL/hr over 240 Minutes Intravenous 3 times per day 04/09/12 2345     04/09/12 2115   piperacillin-tazobactam (ZOSYN) IVPB 3.375 g        3.375 g 100 mL/hr over 30 Minutes Intravenous  Once 04/09/12 2114 04/09/12 2239          Assessment/Plan: s/p Procedure(s) (LRB): EXPLORATORY LAPAROTOMY (N/A) ILEOSTOMY (N/A) CYSTOSCOPY WITH RETROGRADE PYELOGRAM, URETEROSCOPY AND STENT PLACEMENT (Bilateral) Looks good, advance diet and wean TPN.  consider d/c abx  LOS: 15 days    Lodema Pilot  DAVID 04/24/2012

## 2012-04-24 NOTE — Progress Notes (Signed)
8 Days Post-Op  Subjective:  1 - s/p Bilateral Ureteral Stenting as part of Chron's Surgery - pt  still with catheter, tollerating well. JP output still rel high.  No overnight events.  Objective: Vital signs in last 24 hours: Temp:  [98 F (36.7 C)-98.2 F (36.8 C)] 98.2 F (36.8 C) (06/15 0525) Pulse Rate:  [82-103] 82  (06/15 0525) Resp:  [16-19] 19  (06/15 0525) BP: (136-147)/(51-58) 136/58 mmHg (06/15 0525) SpO2:  [97 %-99 %] 99 % (06/15 0525) Last BM Date: 04/23/12  Intake/Output from previous day: 06/14 0701 - 06/15 0700 In: 2816.9 [P.O.:480; I.V.:460; IV Piggyback:150; TPN:1726.9] Out: 3460 [Urine:2600; Drains:110; Stool:750] Intake/Output this shift: Total I/O In: -  Out: 250 [Stool:250]  General appearance: appears stated age Head: Normocephalic, without obvious abnormality, atraumatic Neck: no adenopathy, no carotid bruit, no JVD, supple, symmetrical, trachea midline and thyroid not enlarged, symmetric, no tenderness/mass/nodules Resp: clear to auscultation bilaterally Breasts: normal appearance, no masses or tenderness Cardio: regular rate and rhythm, S1, S2 normal, no murmur, click, rub or gallop GI: s/p ex-lap. iliostomy RLQ patent. JP with copius serous output Male genitalia: normal, Mild penile edema (normal) Incision/Wound:  Lab Results:  No results found for this basename: WBC:2,HGB:2,HCT:2,PLT:2 in the last 72 hours BMET  [facility] 04/23/12 0220 04/22/12 0517  NA 130* 133*  K 4.0 3.6  CL 101 103  CO2 24 23  GLUCOSE 152* 166*  BUN 11 12  CREATININE 0.65 0.63  CALCIUM 7.8* 7.8*   PT/INR No results found for this basename: LABPROT:2,INR:2 in the last 72 hours ABG No results found for this basename: PHART:2,PCO2:2,PO2:2,HCO3:2 in the last 72 hours  Studies/Results: No results found.  Anti-infectives: Anti-infectives     Start     Dose/Rate Route Frequency Ordered Stop   04/10/12 0000   piperacillin-tazobactam (ZOSYN) IVPB 3.375 g        3.375 g 12.5 mL/hr over 240 Minutes Intravenous 3 times per day 04/09/12 2345     04/09/12 2115   piperacillin-tazobactam (ZOSYN) IVPB 3.375 g        3.375 g 100 mL/hr over 30 Minutes Intravenous  Once 04/09/12 2114 04/09/12 2239          Assessment/Plan: 1 - s/p Bilateral Ureteral Stenting as part of Chron's Surgery - JP Cr today to r/o urine leak in setting of high JP output. If wnl, may DC foley per primary team discretion.    LOS: 15 days    Nicholas [name] 04/24/2012

## 2012-04-24 NOTE — Progress Notes (Signed)
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  Vital Signs: Temp: 98.2 F (36.8 C) (06/15 0525) Temp src: Oral (06/15 0525) BP: 136/58 mmHg (06/15 0525) Pulse Rate: 82  (06/15 0525) Intake/Output from previous day: 06/14 0701 - 06/15 0700 In: 2816.9 [P.O.:480; I.V.:460; IV Piggyback:150; TPN:1726.9] Out: 3460 [Urine:2600; Drains:110; Stool:750] Intake/Output from this shift: Total I/O In: -  Out: 250 [Stool:250] Labs: No results found for this basename: WBC:3,HGB:3,HCT:3,PLT:3,APTT:3,INR:3 in the last 72 hours  Basename 04/23/12 0220 04/22/12 0517  NA 130* 133*  K 4.0 3.6  CL 101 103  CO2 24 23  GLUCOSE 152* 166*  BUN 11 12  CREATININE 0.65 0.63  LABCREA -- --  CREAT24HRUR -- --  CALCIUM 7.8* 7.8*  MG -- 1.9  PHOS -- 2.7  PROT -- 4.3*  ALBUMIN -- 1.3*  AST -- 60*  ALT -- 104*  ALKPHOS -- 175*  BILITOT -- 0.5  BILIDIR -- --  IBILI -- --  PREALBUMIN -- --  TRIG -- --  CHOLHDL -- --  CHOL -- --   Estimated Creatinine Clearance: 82.6 ml/min (by C-G formula based on Cr of 0.65).    Basename 04/24/12 0742 04/23/12 2150 04/23/12 1700  GLUCAP 118* 122* 172*   Insulin Requirements in the past 24 hours:  9 units SSI,  no insulin in TPN BAG  Current Nutrition:  Clinimix E5/20 at 33ml/hr (at goal) (provides weekly average of 1895 kcal and 96 g protein per day)   Nutritional Goals:  1850-2100 kCal, 85-105 grams of protein per day  Assessment: 71 yo M w/ 33 year h/o Crohn's disease, admitted following 5 month h/o of intermittent abdominal pain.  TPN started pre-op, he is now s/p exlap, resection of ileal mass, subtotal colectomy  and ileostomy + bilateral ureterogram and ureteral catheters.    GI Advanced to full liquid diet -tolerating well.  Plans for possible further advancement of diet today. Ostomy with good output.   Endo No h/o DM -CBGs controlled, on moderate SSI.   Lytes/Renal No labs today.  Scr stable, UOP good. IVFs at [facility].    Hepatobil LFTs WNl, Trig and Chol WNL. 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 VSS. Continues on dig, metoprolol IV scheduled  ID Afeb, WBC trending down, on empiric abx for abdominal process. Zosyn: 6/1>>  Px PPI IV, SQ heparin for dvt pxl   Plan:  - Continue Clinimix E5/20 at 53ml/hr, will f/up progress with PO intake - MVI, TE, lipids at 5 ml/hr MWF only 2/2 national shortage  Estephania Licciardi K. Allena Katz, PharmD, BCPS.  Clinical Pharmacist Pager [phone]. 04/24/2012 9:40 AM

## 2012-04-25 LAB — GLUCOSE, CAPILLARY
Glucose-Capillary: 118 mg/dL — ABNORMAL HIGH (ref 70–99)
Glucose-Capillary: 106 mg/dL — ABNORMAL HIGH (ref 70–99)

## 2012-04-25 MED ORDER — INSULIN ASPART 100 UNIT/ML ~~LOC~~ SOLN: 0.0000 [IU] | Freq: Three times a day (TID) | SUBCUTANEOUS | Status: AC

## 2012-04-25 MED ORDER — INSULIN ASPART 100 UNIT/ML ~~LOC~~ SOLN: 0.0000 [IU] | Freq: Every day | SUBCUTANEOUS | Status: DC

## 2012-04-25 NOTE — Progress Notes (Signed)
9 Days Post-Op  Subjective: Tolerating diet. No complaints  Objective: Vital signs in last 24 hours: Temp:  [98.3 F (36.8 C)-98.8 F (37.1 C)] 98.6 F (37 C) (06/16 0530) Pulse Rate:  [87-108] 87  (06/16 0530) Resp:  [17-18] 17  (06/16 0530) BP: (134-136)/(52-55) 136/55 mmHg (06/16 0530) SpO2:  [98 %-100 %] 99 % (06/16 0530) Last BM Date: 04/25/12  Intake/Output from previous day: 06/15 0701 - 06/16 0700 In: 1901.8 [P.O.:360; I.V.:511.3; TPN:1030.5] Out: 2975 [Urine:2250; Drains:60; Stool:665] Intake/Output this shift:    General appearance: alert, cooperative and no distress GI: soft, nt, nd, ostomy functioning, wound with healthy granulation tissue  Lab Results:  No results found for this basename: WBC:2,HGB:2,HCT:2,PLT:2 in the last 72 hours BMET  [facility] 04/23/12 0220  NA 130*  K 4.0  CL 101  CO2 24  GLUCOSE 152*  BUN 11  CREATININE 0.65  CALCIUM 7.8*   PT/INR No results found for this basename: LABPROT:2,INR:2 in the last 72 hours ABG No results found for this basename: PHART:2,PCO2:2,PO2:2,HCO3:2 in the last 72 hours  Studies/Results: No results found.  Anti-infectives: Anti-infectives     Start     Dose/Rate Route Frequency Ordered Stop   04/10/12 0000   piperacillin-tazobactam (ZOSYN) IVPB 3.375 g  Status:  Discontinued        3.375 g 12.5 mL/hr over 240 Minutes Intravenous 3 times per day 04/09/12 2345 04/24/12 1019   04/09/12 2115   piperacillin-tazobactam (ZOSYN) IVPB 3.375 g        3.375 g 100 mL/hr over 30 Minutes Intravenous  Once 04/09/12 2114 04/09/12 2239          Assessment/Plan: s/p Procedure(s) (LRB): EXPLORATORY LAPAROTOMY (N/A) ILEOSTOMY (N/A) CYSTOSCOPY WITH RETROGRADE PYELOGRAM, URETEROSCOPY AND STENT PLACEMENT (Bilateral) remove foley, and wean TPN, should be okay for discharge when TPN off and voiding normally  LOS: 16 days    Denece Shearer DAVID 04/25/2012

## 2012-04-25 NOTE — Progress Notes (Addendum)
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  Vital Signs: Temp: 98.6 F (37 C) (06/16 0530) BP: 136/55 mmHg (06/16 0530) Pulse Rate: 87  (06/16 0530) Intake/Output from previous day: 06/15 0701 - 06/16 0700 In: 1901.8 [P.O.:360; I.V.:511.3; TPN:1030.5] Out: 2975 [Urine:2250; Drains:60; Stool:665] Intake/Output from this shift:   Labs: No results found for this basename: WBC:3,HGB:3,HCT:3,PLT:3,APTT:3,INR:3 in the last 72 hours  Basename 04/23/12 0220  NA 130*  K 4.0  CL 101  CO2 24  GLUCOSE 152*  BUN 11  CREATININE 0.65  LABCREA --  CREAT24HRUR --  CALCIUM 7.8*  MG --  PHOS --  PROT --  ALBUMIN --  AST --  ALT --  ALKPHOS --  BILITOT --  BILIDIR --  IBILI --  PREALBUMIN --  TRIG --  CHOLHDL --  CHOL --   Estimated Creatinine Clearance: 82.6 ml/min (by C-G formula based on Cr of 0.65).    Basename 04/25/12 0731 04/24/12 2208 04/24/12 1721  GLUCAP 118* 151* 142*   Insulin Requirements in the past 24 hours:  9 units SSI,  no insulin in TPN BAG  Current Nutrition:  Clinimix E5/20 at 109ml/hr (at goal) (provides weekly average of 1895 kcal and 96 g protein per day)  Nutritional Goals:  1850-2100 kCal, 85-105 grams of protein per day  Assessment: 71 yo [name] w/ 33 year h/o Crohn's disease, admitted following 5 month h/o of intermittent abdominal pain.  TPN started pre-op, he is now s/p exlap, resection of ileal mass, subtotal colectomy and ileostomy + bilateral ureterogram and ureteral catheters.    GI Regular diet -tolerating well. Ostomy with good output.   Endo No h/o DM -CBGs controlled, on moderate SSI.   Lytes/Renal  No labs today.  Scr stable, UOP good. IVFs at [facility].    Hepatobil LFTs WNl, Trig and Chol WNL. 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 VSS. Continues on dig, metoprolol IV scheduled  ID Afeb, WBC trending down. Now off Zosyn s/p 15d course  Px PPI IV, SQ heparin for dvt pxl   Plan:  - Will plan to d/c TPN once current bag runs out - Will d/c SSI and pending TPN labs - Please reconsult if needed. Please assess need to reinstate CBG checks and insulin coverage (CBGs have been controlled, he does not have a hx of DM)  Herma Uballe K. Allena Katz, PharmD, BCPS.  Clinical Pharmacist Pager [phone]. 04/25/2012 10:34 AM

## 2012-04-25 NOTE — Progress Notes (Addendum)
Patient had a medium dark tarry stool in addition to ileostomy output.

## 2012-04-26 LAB — CBC
HCT: 22.4 % — ABNORMAL LOW (ref 39.0–52.0)
Hemoglobin: 7.2 g/dL — ABNORMAL LOW (ref 13.0–17.0)
MCH: 25.2 pg — ABNORMAL LOW (ref 26.0–34.0)
MCHC: 32.1 g/dL (ref 30.0–36.0)
MCV: 78.3 fL (ref 78.0–100.0)
Platelets: 462 10*3/uL — ABNORMAL HIGH (ref 150–400)
RBC: 2.86 MIL/uL — ABNORMAL LOW (ref 4.22–5.81)
RDW: 17.6 % — ABNORMAL HIGH (ref 11.5–15.5)
WBC: 14.3 10*3/uL — ABNORMAL HIGH (ref 4.0–10.5)

## 2012-04-26 LAB — GLUCOSE, CAPILLARY: Glucose-Capillary: 88 mg/dL (ref 70–99)

## 2012-04-26 MED ORDER — PANTOPRAZOLE SODIUM 40 MG PO TBEC: 40.0000 mg | DELAYED_RELEASE_TABLET | Freq: Every day | ORAL | Status: DC

## 2012-04-26 MED ORDER — CYCLOBENZAPRINE HCL 5 MG PO TABS: 5.0000 mg | ORAL_TABLET | Freq: Three times a day (TID) | ORAL | Status: AC | PRN

## 2012-04-26 NOTE — Progress Notes (Signed)
Patient was doing well. Agree with DC plans

## 2012-04-26 NOTE — Discharge Summary (Signed)
Physician Discharge Summary  Patient ID: ERYC BODEY MRN: [id number] DOB/AGE: [birth year] 71 y.o.  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   Discharged Condition: stable  Hospital Course: This is a 71 year old gentleman who was admitted reported history of weight loss abdominal pain and an abdominal mass. His past history includes; vagotomy and pyloroplasty for ulcer disease in 1960s. He has past history of some type of a laparotomy and operation for Crohn's disease. CT study demonstarted an inflammatory mass involving the distal small bowel with a possible small interloop abscess. His pain improved while on antibiotics. He had a Gastrografin enema which showed a fistula between the sigmoid colon and the ileum. The right colon, transverse colon, descending colon and proximal sigmoid colon appeared to be massively impacted with stool which appeared to be chronic. The patient was made NPO, and placed on hyperalimentation.The plan was for him to undergo surgical resection of the ileal inflammatory mass as well as a subtotal colectomy. He had this procedure and has since done well; progressing to a regular diet, remaining hemodynamically stable and afebrile during his hospital course. At this time it is deemed that he is stable for discharge to home with f/u with Dr. Derrell Lolling in 1-2 weeks time, as well as ongoing home health care for his ileostomy.   Consults: None  Significant Diagnostic Studies: labs and microbiology.  Treatments: IV hydration, antibiotics, analgesia, anticoagulation, insulin, TPN, respiratory therapy and surgery.  Discharge Exam: Blood pressure 130/62, pulse 89, temperature 98.4 F (36.9 C), temperature source Oral, resp.  rate 18, height 5\' 9"  (1.753 m), weight 154 lb 5.2 oz (70 kg), SpO2 99.00%. General appearance: alert, cooperative, appears stated age and no distress Abdomen: soft, nt, nd, ostomy functioning, wound with healthy granulation tissue. Midline incision appears to be well approximated, no drng or erythema, staples have been removed and replaced with steri-strips. Small open area at the most distal portion of this incision is to be treated with wet to dry dsg.it appears to be healing well with no visible drng seen, wound edges have healthy granulation tissue.  Disposition: discharge to home with follow-up with Dr. Derrell Lolling in 1-2 weeks time; home health care to assist with ileostomy care.  Medication List  As of 04/26/2012 11:12 AM   ASK your doctor about these medications         aspirin 81 MG EC tablet   Take 81 mg by mouth daily.      budesonide 3 MG 24 hr capsule   Commonly known as: ENTOCORT EC   Take 9 mg by mouth every morning.      digoxin 0.25 MG tablet   Commonly known as: LANOXIN   Take 250 mcg by mouth daily.      fenofibrate micronized 200 MG capsule   Commonly known as: LOFIBRA   Take 200 mg by mouth daily before breakfast.      ferrous sulfate 325 (65 FE) MG tablet   Take 325 mg by mouth 2 (two) times daily.      metoprolol 50 MG tablet   Commonly known as: LOPRESSOR   Take 25 mg by mouth 2 (two) times daily.      multivitamin-lutein Caps   Take 1 capsule by mouth daily.  SignedBlenda Mounts 04/26/2012, 11:12 AM

## 2012-04-26 NOTE — Consult Note (Signed)
Ostomy follow-up:  Assisted patient with pouch change.  He is able to cut and apply one piece pouch with minimal assistance.  Stoma red and viable, above skin level.  Mod liquid green stool in pouch.  Able to open and close to empty with velcro.  Reviewed pouching routines, ordering supplies, and ordering supplies.  Wife at bedside for teaching session; both patient and wife deny further questions at this time.  Plans to be followed by home health after discharge according to [facility].

## 2012-04-26 NOTE — Progress Notes (Signed)
10 Days Post-Op  Subjective: Fells good, enjoying regular diet without problems. Still with chronic LBP; but flexeril has helped. Otherwise no new c/o.  Objective: Vital signs in last 24 hours: Temp:  [98.3 F (36.8 C)-98.8 F (37.1 C)] 98.4 F (36.9 C) (06/17 0556) Pulse Rate:  [89-103] 89  (06/17 0556) Resp:  [18] 18  (06/17 0556) BP: (122-130)/(56-66) 130/62 mmHg (06/17 0556) SpO2:  [97 %-100 %] 99 % (06/17 0556) Last BM Date: 04/25/12  Intake/Output from previous day: 06/16 0701 - 06/17 0700 In: 1217.3 [P.O.:360; I.V.:497.3; TPN:360] Out: 3510 [Urine:2150; Drains:60; Stool:1300] Intake/Output this shift:    General appearance: alert, cooperative and no distress Abdomen: soft, nt, nd, ostomy functioning, wound with healthy granulation tissue.   Lab results:  [facility] 04/26/12 0508  WBC 14.3*  HGB 7.2*  HCT 22.4*  PLT 462*   BMET No results found for this basename: NA:2,K:2,CL:2,CO2:2,GLUCOSE:2,BUN:2,CREATININE:2,CALCIUM:2 in the last 72 hours PT/INR No results found for this basename: LABPROT:2,INR:2 in the last 72 hours ABG No results found for this basename: PHART:2,PCO2:2,PO2:2,HCO3:2 in the last 72 hours  Studies/Results: No results found.  Anti-infectives: Anti-infectives     Start     Dose/Rate Route Frequency Ordered Stop   04/10/12 0000   piperacillin-tazobactam (ZOSYN) IVPB 3.375 g  Status:  Discontinued        3.375 g 12.5 mL/hr over 240 Minutes Intravenous 3 times per day 04/09/12 2345 04/24/12 1019   04/09/12 2115  piperacillin-tazobactam (ZOSYN) IVPB 3.375 g       3.375 g 100 mL/hr over 30 Minutes Intravenous  Once 04/09/12 2114 04/09/12 2239          Assessment/Plan: s/p Procedure(s) (LRB): EXPLORATORY LAPAROTOMY (N/A) ILEOSTOMY (N/A) CYSTOSCOPY WITH RETROGRADE PYELOGRAM, URETEROSCOPY AND STENT PLACEMENT (Bilateral) 1.DC IVF 2.Possible discharge to home today, pending Dr. Tenna Child evaluation and recommendations.  LOS: 17 days     Nicholas [name] 04/26/2012

## 2012-04-26 NOTE — Progress Notes (Signed)
Patient discharged to home with instructions, no complaints 

## 2012-04-26 NOTE — Progress Notes (Signed)
Physical Therapy Treatment Patient Details Name: Nicholas [name] MRN: [id number] DOB: [birth year] Today's Date: 04/26/2012 Time: 4098-1191 PT Time Calculation (min): 10 min  PT Assessment / Plan / Recommendation Comments on Treatment Session  Mr. Schowalter has made good progress, no longer needs the RW but discussed the possibility of using a cane for higher challenging activities especially in the community which patient denied. Pt still with difficulty navigating obstacles and head turns and ambulating with caution. He is aware of my concerns here and my recommendation to use a cane (when going outdoors or in the community) but he still denies a need. Easily fatigued. Educated pt on a progressive walking program at home for improved activity tolerance and strength.     Follow Up Recommendations  No PT follow up    Barriers to Discharge        Equipment Recommendations  Gilmer Mor (but patient denies this need)    Recommendations for Other Services    Frequency Min 3X/week   Plan Discharge plan remains appropriate;Frequency remains appropriate    Precautions / Restrictions Precautions Precautions: Fall Restrictions Weight Bearing Restrictions: No       Mobility  Bed Mobility Bed Mobility: Not assessed Transfers Sit to Stand: 6: Modified independent (Device/Increase time);From chair/3-in-1;With upper extremity assist Stand to Sit: 6: Modified independent (Device/Increase time);To chair/3-in-1;With upper extremity assist Ambulation/Gait Ambulation/Gait Assistance: 5: Supervision Ambulation Distance (Feet): 250 Feet Assistive device: None Ambulation/Gait Assistance Details: slow gait speed, difficulty with higher level challenges (head turns and obstacle avoidance) Gait Pattern: Trunk flexed;Narrow base of support;Decreased stride length    Exercises      PT Goals Acute Rehab PT Goals PT Goal: Sit to Stand - Progress: Met PT Goal: Stand to Sit - Progress: Met PT Goal: Ambulate -  Progress: Progressing toward goal  Visit Information  Last PT Received On: 04/26/12 Assistance Needed: +1    Subjective Data  Subjective: Im ready to go home today.    Cognition  Overall Cognitive Status: Appears within functional limits for tasks assessed/performed Arousal/Alertness: Awake/alert Orientation Level: Appears intact for tasks assessed Behavior During Session: [facility] for tasks performed    Balance  High Level Balance High Level Balance Activites: Turns High Level Balance Comments: pt slows down and slight stagger with head turns, difficulty with obstacle avoidance slightly staggering but able to self correct with supervision  End of Session PT - End of Session Activity Tolerance: Patient limited by fatigue Patient left: in chair;with call bell/phone within reach    [facility] HELEN 04/26/2012, 10:41 AM

## 2012-04-26 NOTE — Progress Notes (Signed)
Patient's staples and JP drain removed as ordered, pt. Tolerated well without complaints of pain, wife was at bedside and watched during procedure

## 2012-04-26 NOTE — Discharge Instructions (Signed)
Home health RN will be provided by [facility] [phone] Pain Abdominal pain can be caused by many things. Your caregiver decides the seriousness of your pain by an examination and possibly blood tests and X-rays. Many cases can be observed and treated at home. Most abdominal pain is not caused by a disease and will probably improve without treatment. However, in many cases, more time must pass before a clear cause of the pain can be found. Before that point, it may not be known if you need more testing, or if hospitalization or surgery is needed. HOME CARE INSTRUCTIONS   Do not take laxatives unless directed by your caregiver.   Take pain medicine only as directed by your caregiver.   Only take over-the-counter or prescription medicines for pain, discomfort, or fever as directed by your caregiver.   Try a clear liquid diet (broth, tea, or water) for as long as directed by your caregiver. Slowly move to a bland diet as tolerated.  SEEK IMMEDIATE MEDICAL CARE IF:   The pain does not go away.   You have a fever.   You keep throwing up (vomiting).   The pain is felt only in portions of the abdomen. Pain in the right side could possibly be appendicitis. In an adult, pain in the left lower portion of the abdomen could be colitis or diverticulitis.   You pass bloody or black tarry stools.  MAKE SURE YOU:   Understand these instructions.   Will watch your condition.   Will get help right away if you are not doing well or get worse.  Document Released: 08/06/2005 Document Revised: 10/16/2011 Document Reviewed: 06/14/2008 [facility] Patient Information 2012 [place], [place].

## 2012-04-27 ENCOUNTER — Telehealth: Payer: Self-pay | Admitting: Gastroenterology

## 2012-04-27 NOTE — Telephone Encounter (Signed)
Per the progress report of Dr. Rhea Belton 04/13/12 from recent hospital admission the patient is to return to Dr. Chesley Mires at [facility] to discuss biologic fistula.  The patient reports he has follow up with Dr. Steele Sizer 06/11/12.  I have advised him to contact his office about when they want follow up.  Dr Russella Dar should he follow up with you?

## 2012-04-27 NOTE — Telephone Encounter (Signed)
He should have all his GI care at [facility] from Dr. Chesley Mires. No need to follow up with me.

## 2012-05-04 ENCOUNTER — Encounter (INDEPENDENT_AMBULATORY_CARE_PROVIDER_SITE_OTHER): Payer: Self-pay | Admitting: General Surgery

## 2012-05-04 ENCOUNTER — Ambulatory Visit (INDEPENDENT_AMBULATORY_CARE_PROVIDER_SITE_OTHER): Payer: Medicare Other | Admitting: General Surgery

## 2012-05-04 VITALS — BP 118/72 | HR 68 | Temp 97.3°F | Resp 14 | Ht 69.0 in | Wt 131.1 lb

## 2012-05-04 DIAGNOSIS — K5090 Crohn's disease, unspecified, without complications: Secondary | ICD-10-CM

## 2012-05-04 NOTE — Patient Instructions (Addendum)
You have lost some weight, but you are fundamentally recovering from your abdominal surgery without any major complication.  The biggest focus for the next few weeks is nutrition. Try to eat 3 meals a day and drink something like  ensure  between meals. Take a high potency multivitamin every day.  I recommend that you. joined the Colitis and Ileitis support group.  I recommend that you make an appointment to see Dr. Brunilda Payor, and I gave you a prescription for the lab work that should be done as soon as possible. That lab tests  can be drawn in his office.(CBC, C-met, prealbumin)  I recommended that you make appointment to see Dr. Claudette Head  as soon as possible to discuss long-term management of your Crohn's disease.  Return to see Dr. Derrell Lolling in one month.

## 2012-05-04 NOTE — Progress Notes (Signed)
Patient ID: Nicholas [name], male   DOB: 08-27-1941, 71 y.o.   MRN: [id number]  Chief Complaint  Patient presents with  . Routine Post Op    Exploratory laparotomy, ileostomy, cystoscopy, ureteroscopy 04/16/12    HPI Nicholas [name] is a 71 y.o. male.  He returns for a postop visit following surgery for complicated recurrent Crohn's disease.  This very pleasant gentleman underwent some type of surgery years ago for Crohn's disease. It sounds like he may have had a fistula to his bladder but the extent of resection is unknown. He also had some type of surgery for peptic ulcer disease in 1959.  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 He was placed on antibiotics and hyperalimentation for one week. 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 I probably resected 4 or 5 feet of terminal ileum and estimated that I left 14 or 15 feet of small bowel. Pathology report was consistent with Crohn's disease.  He was maintained on hyperalimentation and went home about 10-12 days later.  Since discharge from the hospital he has been stable but his appetite has been slow to return and his strength has been slow to return, not suprisingly. . [facility] is helping him with his ileostomy. He has not had a followup appointment with Dr. Brunilda Payor his primary care physician, and he has not had a followup with Dr. Claudette Head, his gastroenterologist.  He and his wife state that he is eating reasonably well especially at breakfast. He's not taking any vitamins and doesn't take any nutritional supplements. Combined preop and postop he's probably  lost 30-35 pounds in the past 6 months.  He's not had any nausea or vomiting. He doesn't have any pain. He doesn't have any fever. HPI  Past Medical History  Diagnosis Date  . Hyperlipidemia   . PAF (paroxysmal atrial fibrillation)   . Aortic stenosis, mild   . Crohn's disease   . PAC (premature atrial contraction)   . Palpitations   . Macular degeneration   . Cataract   . Colonic hemorrhage 1964    Past Surgical History  Procedure Date  . Tonsillectomy   . Stomach surgery 1959    ulcer  . US echocardiography 05/20/2010    EF 55-60%  . US echocardiography 10/23/2005    EF 55-60%  . Cardiovascular stress test 11/29/2003    EF 64%  . Appendectomy   . Bladder repair 1964    intestine leaks/bladder repair  . Laparotomy 04/16/2012    Procedure: EXPLORATORY LAPAROTOMY;  Surgeon: Ernestene Mention, MD;  Location: [facility] OR;  Service: General;  Laterality: N/A;  . Ileostomy 04/16/2012    Procedure: ILEOSTOMY;  Surgeon: Ernestene Mention, MD;  Location: [facility] OR;  Service: General;  Laterality: N/A;    Family History  Problem Relation Age of Onset  . Heart disease Maternal Grandfather   . Heart disease      maternal side  . Colon cancer Neg Hx     Social History History  Substance Use Topics  . Smoking status: Former Smoker    Quit date: 11/10/1964  .  Smokeless tobacco: Never Used  . Alcohol Use: No    No Known Allergies  Current Outpatient Prescriptions  Medication Sig Dispense Refill  . aspirin 81 MG EC tablet Take 81 mg by mouth daily.        . budesonide (ENTOCORT EC) 3 MG 24 hr capsule Take 9 mg by mouth every morning.      . cyclobenzaprine (FLEXERIL) 5 MG tablet Take 1 tablet (5 mg total) by mouth 3 (three) times daily as needed for muscle spasms.  30 tablet  0  . digoxin (LANOXIN) 0.25 MG tablet Take 250 mcg by mouth daily.      . fenofibrate micronized (LOFIBRA) 200 MG capsule Take 200 mg by mouth daily before breakfast.        . ferrous sulfate 325 (65 FE) MG tablet  Take 325 mg by mouth 2 (two) times daily.       . metoprolol (LOPRESSOR) 50 MG tablet Take 25 mg by mouth 2 (two) times daily.       . multivitamin-lutein (OCUVITE-LUTEIN) CAPS Take 1 capsule by mouth daily.      . pantoprazole (PROTONIX) 40 MG tablet Take 1 tablet (40 mg total) by mouth daily at 12 noon.  30 tablet  0    Review of Systems Review of Systems  Constitutional: Positive for activity change, appetite change, fatigue and unexpected weight change. Negative for fever and chills.  HENT: Negative for hearing loss, congestion, sore throat, trouble swallowing and voice change.   Eyes: Negative for visual disturbance.  Respiratory: Negative for cough and wheezing.   Cardiovascular: Negative for chest pain, palpitations and leg swelling.  Gastrointestinal: Negative for nausea, vomiting, abdominal pain, diarrhea, constipation, blood in stool, abdominal distention, anal bleeding and rectal pain.  Genitourinary: Negative for hematuria and difficulty urinating.  Musculoskeletal: Negative for arthralgias.  Skin: Negative for rash and wound.  Neurological: Positive for weakness. Negative for seizures, syncope and headaches.  Hematological: Negative for adenopathy. Does not bruise/bleed easily.  Psychiatric/Behavioral: Negative for confusion.    Blood pressure 118/72, pulse 68, temperature 97.3 F (36.3 C), temperature source Temporal, resp. rate 14, height 5\' 9"  (1.753 m), weight 131 lb 2 oz (59.478 kg).  Physical Exam Physical Exam  Constitutional: He is oriented to person, place, and time. No distress.       Thin. 131 pounds, Alert. Pleasant. Deconditioned.  HENT:  Head: Normocephalic.  Nose: Nose normal.  Mouth/Throat: No oropharyngeal exudate.  Eyes: Conjunctivae and EOM are normal. Pupils are equal, round, and reactive to light. Right eye exhibits no discharge. Left eye exhibits no discharge. No scleral icterus.  Neck: Normal range of motion. Neck supple. No JVD present. No  tracheal deviation present. No thyromegaly present.  Cardiovascular: Normal rate, regular rhythm, normal heart sounds and intact distal pulses.   No murmur heard. Pulmonary/Chest: Effort normal and breath sounds normal. No stridor. No respiratory distress. He has no wheezes. He has no rales. He exhibits no tenderness.  Abdominal: Soft. Bowel sounds are normal. He exhibits no distension and no mass. There is no tenderness. There is no rebound and no guarding.       Scaphoid. Soft. Nontender abdomen. Lower midline scar almost completely granulated in.    No infection or hernia. Healthy ileostomy right lower quadrant. Ileostomy appliance with good fit.  Musculoskeletal: Normal range of motion. He exhibits no edema and no tenderness.  Lymphadenopathy:    He has no cervical adenopathy.  Neurological: He is alert and oriented to  person, place, and time. He has normal reflexes. Coordination normal.  Skin: Skin is warm and dry. No rash noted. He is not diaphoretic. No erythema. There is pallor.  Psychiatric: He has a normal mood and affect. His behavior is normal. Judgment and thought content normal.    Data Reviewed Hospital records  Assessment    Recurrent Crohn's disease, complicated by perforation, contained abscess, chronic fistula.  Recovering very slowly but without major complication following resection of terminal ileal mass, subtotal colectomy, ileostomy, and closure of rectal stump.  Protein-calorie malnutrition  Deconditioning  History peptic ulcer disease    Plan    The patient and his wife were encouraged about his condition.  We had long session about nutritional support with emphasis on well-balanced meals, nutritional supplements, and multivitamins.  [facility] to assist with ostomy consultation and care  Advised contact and involvement with the local colitis and ileitis support group  Advised that he make an appointment to see Dr. Brunilda Payor, and that he get a CBC,  complete metabolic panel, and prealbumin at his office as soon as possible as well.  Advised he make an appointment with Dr. Russella Dar in  the near future to discuss short and long-term strategy for management of his Crohn's disease  I told them that ultimately we could consider closure of his ileostomy but it would be many, many months from now.  Return to see me in one month. I gave him a goal of gaining 7 pounds in 4 weeks.       Angelia Mould. Derrell Lolling, M.D., [facility] General and Minimally invasive Surgery Breast and Colorectal Surgery Office:   [phone] Pager:   [phone]  05/04/2012, 4:52 PM

## 2012-05-05 ENCOUNTER — Telehealth: Payer: Self-pay | Admitting: Gastroenterology

## 2012-05-05 NOTE — Telephone Encounter (Signed)
Patient advised again that he should be getting his GI follow up care for crohn's at [facility] with dr. Chesley Mires.  See phone note from 04/27/12.

## 2012-05-10 DIAGNOSIS — E46 Unspecified protein-calorie malnutrition: Secondary | ICD-10-CM | POA: Insufficient documentation

## 2012-05-11 ENCOUNTER — Telehealth (INDEPENDENT_AMBULATORY_CARE_PROVIDER_SITE_OTHER): Payer: Self-pay | Admitting: General Surgery

## 2012-05-11 NOTE — Telephone Encounter (Signed)
AMBER WITH [facility] CALLED TO EXTEND TWICE A WEEK HOME VISITS TO ASSESS WOUND CARE/ I REVIEWED REQUEST WITH DR. Derrell Lolling AND HE SAID OK FOR CONTINUED VISITS AND ALSO CONTINUE ASSESSING NUTRITIONAL STATUS/ PT HAS F/U THIS MONTH WITH DR. Derrell Lolling I LEFT VOICE MESSAGE FOR AMBER RE HIS RESPONSE/ [phone] EXT- [phone]

## 2012-05-17 ENCOUNTER — Telehealth (INDEPENDENT_AMBULATORY_CARE_PROVIDER_SITE_OTHER): Payer: Self-pay | Admitting: General Surgery

## 2012-05-17 ENCOUNTER — Other Ambulatory Visit (INDEPENDENT_AMBULATORY_CARE_PROVIDER_SITE_OTHER): Payer: Self-pay | Admitting: General Surgery

## 2012-05-17 DIAGNOSIS — K5090 Crohn's disease, unspecified, without complications: Secondary | ICD-10-CM

## 2012-05-17 NOTE — Telephone Encounter (Signed)
Called patient back and confirmed that he will go to [facility] tomorrow to have testing done.

## 2012-05-17 NOTE — Telephone Encounter (Signed)
Called patient to advise that Dr. Derrell Lolling has requested 3 tests to be done before he sees him this week. Patient advised to go to [facility] by Wednesday so that we can have the results by Friday before his appointment. The patient agreed. Gave him [facility] toll free # [phone] in order to call and confirm location that is close to his home.

## 2012-05-17 NOTE — Telephone Encounter (Signed)
Pt's wife calling with concern for him losing weight.  At office visit on 6/25 he weighed 131 lbs; today at home is only 122 lbs.  She states that "it only takes about 30 minutes for whatever he eats to come out into the bag."  She also states he is very weak.  He has an appt with Dr. Chesley Mires at [facility] in last July and they apparently cannot see him sooner.  Wife is most worried about his continuing to loose weight.

## 2012-05-18 ENCOUNTER — Other Ambulatory Visit (INDEPENDENT_AMBULATORY_CARE_PROVIDER_SITE_OTHER): Payer: Self-pay | Admitting: General Surgery

## 2012-05-18 LAB — COMPREHENSIVE METABOLIC PANEL
Alkaline Phosphatase: 62 U/L (ref 39–117)
BUN: 25 mg/dL — ABNORMAL HIGH (ref 6–23)
Creat: 1.06 mg/dL (ref 0.50–1.35)
Glucose, Bld: 107 mg/dL — ABNORMAL HIGH (ref 70–99)
Total Bilirubin: 0.3 mg/dL (ref 0.3–1.2)

## 2012-05-18 LAB — CBC
HCT: 37 % — ABNORMAL LOW (ref 39.0–52.0)
Hemoglobin: 11.8 g/dL — ABNORMAL LOW (ref 13.0–17.0)
MCH: 25.1 pg — ABNORMAL LOW (ref 26.0–34.0)
MCHC: 31.9 g/dL (ref 30.0–36.0)

## 2012-05-19 ENCOUNTER — Ambulatory Visit (INDEPENDENT_AMBULATORY_CARE_PROVIDER_SITE_OTHER): Payer: Medicare Other | Admitting: General Surgery

## 2012-05-19 ENCOUNTER — Encounter (INDEPENDENT_AMBULATORY_CARE_PROVIDER_SITE_OTHER): Payer: Self-pay | Admitting: General Surgery

## 2012-05-19 ENCOUNTER — Other Ambulatory Visit (INDEPENDENT_AMBULATORY_CARE_PROVIDER_SITE_OTHER): Payer: Self-pay | Admitting: General Surgery

## 2012-05-19 ENCOUNTER — Telehealth (INDEPENDENT_AMBULATORY_CARE_PROVIDER_SITE_OTHER): Payer: Self-pay | Admitting: General Surgery

## 2012-05-19 VITALS — BP 106/60 | HR 68 | Temp 96.9°F | Resp 14 | Ht 69.0 in | Wt 125.5 lb

## 2012-05-19 DIAGNOSIS — K5090 Crohn's disease, unspecified, without complications: Secondary | ICD-10-CM

## 2012-05-19 DIAGNOSIS — R634 Abnormal weight loss: Secondary | ICD-10-CM

## 2012-05-19 DIAGNOSIS — E46 Unspecified protein-calorie malnutrition: Secondary | ICD-10-CM

## 2012-05-19 LAB — T4: T4, Total: 7.6 ug/dL (ref 5.0–12.5)

## 2012-05-19 LAB — TSH: TSH: 4.257 u[IU]/mL (ref 0.350–4.500)

## 2012-05-19 NOTE — Progress Notes (Addendum)
Patient ID: Nicholas [name], male   DOB: 05-16-1941, 71 y.o.   MRN: [id number]  Chief Complaint  Patient presents with  . Routine Post Op    Discuss test results    HPI Nicholas [name] is a 71 y.o. male. He returns for followup of his  disease, and recent abdominal surgery.  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 He was placed on hyperalimentation and antibiotics. On April 16, 2012 he underwent subtotal colectomy, resection of terminal ileal mass, Brooke ileostomy. He was maintained on hyperalimentation until the day of discharge which was June 17.  I saw him on June 25 at which time his weight was 131.  His wife called 2 days ago stating that his weight continued to decline.  Laboratories obtained yesterday which shows hemoglobin is up from 7.2 to 11.8. WBC is down from 14.3  t o 9.8.  comprehensive metabolic panel shows BUN 25, creatinine 1.6, sodium 132, potassium 5.7, albumin 4.0. Pre-albumin is normal at 26.4.  He states that he does not have any fever, pain nausea or vomiting. He's feeling a little bit better. He sleeping a little bit better. He has a little bit more energy. He is drinking one can of Ensure per day. He eats very well at breakfast and lunch with doesn't like to eat supper. His ileostomy output is variable, sometimes soft and formed and sometimes watery but having less than 10 bags emptied per day.  He has had one postop visit appointment with Dr. Brunilda [name]. He has an appointmen with his gastroenterologist, Dr. Raeanne [name] at [facility] on July 31.  HPI  Past Medical History  Diagnosis Date  . Hyperlipidemia   . PAF (paroxysmal atrial fibrillation)   . Aortic stenosis, mild   . Crohn's disease   . PAC (premature atrial contraction)   . Palpitations   . Macular degeneration   . Cataract   . Colonic hemorrhage 1964     Past Surgical History  Procedure Date  . Tonsillectomy   . Stomach surgery 1959    ulcer  . US echocardiography 05/20/2010    EF 55-60%  . US echocardiography 10/23/2005    EF 55-60%  . Cardiovascular stress test 11/29/2003    EF 64%  . Appendectomy   . Bladder repair 1964    intestine leaks/bladder repair  . Laparotomy 04/16/2012    Procedure: EXPLORATORY LAPAROTOMY;  Surgeon: Ernestene Mention, MD;  Location: [facility] OR;  Service: General;  Laterality: N/A;  . Ileostomy 04/16/2012    Procedure: ILEOSTOMY;  Surgeon: Ernestene Mention, MD;  Location: [facility] OR;  Service: General;  Laterality: N/A;    Family History  Problem Relation Age of Onset  . Heart disease Maternal Grandfather   . Heart disease      maternal side  . Colon cancer Neg Hx     Social History History  Substance Use Topics  . Smoking status: Former Smoker    Quit date: 11/10/1964  . Smokeless tobacco: Never Used  . Alcohol Use: No    No Known Allergies  Current Outpatient Prescriptions  Medication Sig Dispense Refill  . Multiple Vitamin (MULTIVITAMIN) tablet Take 1 tablet by mouth daily. Per patient it is the generic version of centrum silver.      [name] aspirin 81 MG EC tablet Take 81 mg by mouth daily.        [name]  digoxin (LANOXIN) 0.25 MG tablet Take 250 mcg by mouth daily.      . fenofibrate micronized (LOFIBRA) 200 MG capsule Take 200 mg by mouth daily before breakfast.        . ferrous sulfate 325 (65 FE) MG tablet Take 325 mg by mouth 2 (two) times daily.       . metoprolol (LOPRESSOR) 50 MG tablet Take 25 mg by mouth 2 (two) times daily.       . multivitamin-lutein (OCUVITE-LUTEIN) CAPS Take 1 capsule by mouth daily.      . pantoprazole (PROTONIX) 40 MG tablet Take 1 tablet (40 mg total) by mouth daily at 12 noon.  30 tablet  0    Review of Systems Review of Systems  Constitutional: Positive for fatigue and unexpected weight change. Negative for fever, chills and diaphoresis.  HENT: Negative for hearing  loss, congestion, sore throat, trouble swallowing and voice change.   Eyes: Negative for visual disturbance.  Respiratory: Negative for cough and wheezing.   Cardiovascular: Negative for chest pain, palpitations and leg swelling.  Gastrointestinal: Negative for nausea, vomiting, abdominal pain, diarrhea, constipation, blood in stool, abdominal distention, anal bleeding and rectal pain.  Genitourinary: Negative for hematuria and difficulty urinating.  Musculoskeletal: Negative for arthralgias.  Skin: Negative for rash and wound.  Neurological: Negative for seizures, syncope, weakness and headaches.  Hematological: Negative for adenopathy. Does not bruise/bleed easily.  Psychiatric/Behavioral: Negative for confusion.    Blood pressure 106/60, pulse 68, temperature 96.9 F (36.1 C), temperature source Temporal, resp. rate 14, height 5\' 9"  (1.753 m), weight 125 lb 8 oz (56.926 kg).  Physical Exam Physical Exam  Constitutional: He is oriented to person, place, and time. No distress.       Thin. Weight 125   HENT:  Head: Normocephalic and atraumatic.  Nose: Nose normal.  Mouth/Throat: No oropharyngeal exudate.  Cardiovascular: Normal rate, regular rhythm, normal heart sounds and intact distal pulses.   No murmur heard. Pulmonary/Chest: Effort normal and breath sounds normal. No respiratory distress. He has no wheezes. He has no rales. He exhibits no tenderness.  Abdominal: Soft. Bowel sounds are normal. He exhibits no distension and no mass. There is no tenderness. There is no rebound and no guarding.       Abdomen scaphoid. Nontender. Midline incision has almost completely healed. No hernia. Ileostomy pink healthy with good blood. The ileostomy appliance fits well without leak. Stool is semi-formed.  Neurological: He is alert and oriented to person, place, and time.  Skin: Skin is warm and dry. No rash noted. He is not diaphoretic. No erythema. There is pallor.  Psychiatric: He has a normal  mood and affect. His behavior is normal. Judgment and thought content normal.    Data Reviewed Lab work. I spoke to RN at [facility] to discuss patient needs.  Assessment    Complicated, recurrent Crohn's disease.  Status post recent emergent hospitalization and major ileocolonic resection with Brook ileostomy. Recovering from surgery without major complication.  Weight loss. Logically,  this should be due to protein calorie malnutrition, however with normal protein stores and normal bone marrow recovery, this  do not exactly well explained. He may be mildly dehydrated as manifest by his elevated BUN.  There may be a malabsorption problem.    Plan    Began cyclic TNA. Insert double-lumen PICC line to facilitate this. D[facility]ed with [facility].  Keep his appointment with Dr. Tyler Aas at [facility] discuss possible malabsorption problems  Imodium twice daily as a trial to see if we can slow down his stools and perhaps he will have less absorption problems and long term management of Crohn's.  Continue Ensure 3 cans a day. Diet as tolerated  Encourage more water intake up to 6 glasses of water per day because of mild volume contraction.  The patient's clinical care plan was discussed with Dr. Dossie Arbour. Dr. Eloise Harman suggested checking TSH and free T4 to rule out hypothyroidism. We will arrange for that to be done this week.  Return to see me in 3 weeks.  ADDENDUM:  T4 = 7.6(normal).   TSH = 4.257 (normal)       Bryer Cozzolino M. Derrell Lolling, M.D., [facility] General and Minimally invasive Surgery Breast and Colorectal Surgery Office:   [phone] Pager:   [phone]  05/19/2012, 1:32 PM

## 2012-05-19 NOTE — Telephone Encounter (Signed)
Patient called per Dr. Jacinto Halim request and agreed to be seen today at 1:00 instead of on Friday.

## 2012-05-19 NOTE — Telephone Encounter (Signed)
Called and left message for Dr. Silvano Rusk nurse Pura Spice) to advise that this patient will be seen by Dr. Derrell Lolling today at 1:00. I advised in the message that a fax will be sent to her attention later this afternoon with the progress notes and test results for this patient at Dr. Jacinto Halim request. Left call back number if there are any questions.

## 2012-05-19 NOTE — Patient Instructions (Signed)
You seem to be recovering from your abdominal surgery without any major complications. Your lab work looks pretty good, considering. The only problem is continued weight loss.  For now, we are going to start you on intravenous nutritional support, called hyperalimentation. [facility] for that to be done.  Also I'm  going to ask you to take Imodium one tablet twice a day to try to slow down your ileostomy output in case there is a malabsorption problem.  Drink 6 glasses of water a day.  It is important for you to keep your appointment with Dr. Tyler Aas at [facility]  to see if he has any advice about weight loss and malabsorption.  Keep your appointment with Dr. Brunilda Payor  Return to see Dr. Derrell Lolling in 3 weeks.

## 2012-05-19 NOTE — Progress Notes (Unsigned)
[facility]ed to [facility] signed approval from Dr. Derrell Lolling to run TSH and T4 test requested. They have already been advised to fax the results to patient PCP Ivery Quale at [facility].

## 2012-05-20 ENCOUNTER — Ambulatory Visit (HOSPITAL_COMMUNITY): Admission: RE | Admit: 2012-05-20 | Payer: Medicare Other | Source: Ambulatory Visit

## 2012-05-20 ENCOUNTER — Ambulatory Visit (HOSPITAL_COMMUNITY)
Admission: RE | Admit: 2012-05-20 | Discharge: 2012-05-20 | Disposition: A | Discharge status: Home / Self Care | Payer: Medicare Other | Source: Ambulatory Visit | Attending: General Surgery | Admitting: General Surgery

## 2012-05-20 ENCOUNTER — Encounter (INDEPENDENT_AMBULATORY_CARE_PROVIDER_SITE_OTHER): Payer: Self-pay | Admitting: General Surgery

## 2012-05-20 ENCOUNTER — Other Ambulatory Visit (INDEPENDENT_AMBULATORY_CARE_PROVIDER_SITE_OTHER): Payer: Self-pay | Admitting: General Surgery

## 2012-05-20 DIAGNOSIS — R634 Abnormal weight loss: Secondary | ICD-10-CM

## 2012-05-20 DIAGNOSIS — K5090 Crohn's disease, unspecified, without complications: Secondary | ICD-10-CM

## 2012-05-20 DIAGNOSIS — E46 Unspecified protein-calorie malnutrition: Secondary | ICD-10-CM

## 2012-05-20 NOTE — Progress Notes (Unsigned)
Entered order into system for PICC line care by [facility]. Information has been faxed over to the attention of Neita Garnet at [facility] fax [phone]. Received detail from Katie pertaining to TPN care and the insurance restrictions related to it. Information to be shown to Dr. Derrell Lolling for review. Katie indicated that she did instruct his nurse to check is ileostomy output and for the patient to document it daily as well. If they are able to show that he has more output than input, the insurance may cover the TPN, currently the patient does not qualify.

## 2012-05-20 NOTE — Procedures (Signed)
Successful placement of right brachial vein approach dual lumen PICC line with tip at the superior caval-atrial junction.  The PICC line is ready for immediate use. 

## 2012-05-21 ENCOUNTER — Encounter (INDEPENDENT_AMBULATORY_CARE_PROVIDER_SITE_OTHER): Payer: Medicare Other | Admitting: General Surgery

## 2012-05-25 ENCOUNTER — Encounter (INDEPENDENT_AMBULATORY_CARE_PROVIDER_SITE_OTHER): Payer: Self-pay

## 2012-05-26 ENCOUNTER — Telehealth (INDEPENDENT_AMBULATORY_CARE_PROVIDER_SITE_OTHER): Payer: Self-pay | Admitting: General Surgery

## 2012-05-26 NOTE — Telephone Encounter (Signed)
Left message for Pura Spice at [facility] (Dr. Norval Gable nurse) regarding patient. Advised that I wanted to confirm if the patient or his wife called them about consent for TPN, and possible evaluation by Dr. Jarold Motto. Asked she return the call to advise.

## 2012-05-26 NOTE — Telephone Encounter (Signed)
Called patient back to advised that he does not meet requirement for TPN care per insurance. Advised I will try to get the letter for patient (if another doctor is willing to do this on [name] behalf). Patient wife picked up line and advised that she will contact Dr. [name] to see if he would be willing to issue the letter. Patient stated that he has gained 2 lbs since last visit with Nicholas [name] and that he was not told by Nicholas [name] to document his input and output in order to help him qualify for TPN care. Advised patient spouse Nicholas [name]) I would contact her as soon as I had an answer.

## 2012-05-26 NOTE — Telephone Encounter (Signed)
Sharae from [employer] called stating the patient spouse called her asking about coverage for the TPN. Advised her I already spoke with Florentina Addison at [facility] and I am aware of the request for a letter detailing from Dr. Derrell Lolling why he wants this patient to have TPN. I also made Sharae aware that I know the patient does not qualify for TPN (per prior conversation with Florentina Addison at [facility]) based on his current medical situation and that unless his situation changes or the letter is not submitted by Dr. Derrell Lolling, the TPN would not be approved. I advised Nadean Corwin that I will discuss with Dr. Derrell Lolling when he returns to the office and will send the letter to [facility] attention. Nadean Corwin stated her purpose in the call was to advise that the patient did not qualify for TPN at this time and agreed that if a letter was sent to [facility] that may help.

## 2012-05-26 NOTE — Telephone Encounter (Signed)
Nicholas [name] called back and was advised of information discussed with patient and spouse today. She advised they have the progress notes from LOV w/[facility] on 05/19/12 and will contact the patient today after their clinic closes to discuss options. Patient may be seen by Dr. Jarold Motto, or may need to be referred to GI who is treating him for his Crohn's.

## 2012-05-26 NOTE — Telephone Encounter (Signed)
Called and spoke to [place] (spouse) to advise that I left a message for Dr. Norval Gable nurse Lupita Leash asking for call back regarding patient. Nicholas [name] confirmed he is drinking both ensure and boost. I advised her that when I am able to speak to Dr. Jarold Motto or his nurse I will ask if patient can be seen by him to evaluate his condition. I advised that if he continues gaining weight he may not need the TPN at all. However, I advised that he still has to continue to pick up the caloric intake and suggested they go to a health food store or check with the pharmacy at [facility] to ask what protein supplement they suggest he can use that will not cause the gas that he gets from using the Ensure and Boost. I suggested they try a protein powder that can be added to juice or fruit and mixed with ice to make a smoothie. I advised they could try a milk shake, but that may cause more gas for the patient. Nicholas [name] stated he has been changing his ostomy bag at least 11 times a day, and they have ordered more supplies. I also advised that one of the possible side effects from TPN is diarrhea which would undermine all the work the patient has put into gaining his weight back. I advised Nicholas [name] that as soon as I get a response back, I will advise her of what Dr. Jarold Motto or his nurse has told me. [facility] agreed.

## 2012-05-31 ENCOUNTER — Encounter (INDEPENDENT_AMBULATORY_CARE_PROVIDER_SITE_OTHER): Payer: Self-pay | Admitting: General Surgery

## 2012-05-31 ENCOUNTER — Telehealth (INDEPENDENT_AMBULATORY_CARE_PROVIDER_SITE_OTHER): Payer: Self-pay | Admitting: General Surgery

## 2012-05-31 NOTE — Telephone Encounter (Signed)
Called and left message to obtain update regarding patient based on conversation last week. Trying to determine if Dr. Jarold Motto has spoken to or seen the patient and if GI or nutrition consult was started for this patient by his GI doctor.

## 2012-05-31 NOTE — Telephone Encounter (Signed)
Marcelino Duster called in to obtain update on patient. Advised that based on discussion with Dr. Norval Gable nurse Pura Spice) the patient continues to gain weight. He gained 2 lbs since last week and 7 since LOV with Jarold Motto. Based on that he still would not qualify for the TPN Dr. Derrell Lolling requested. Also advised that patient stools are now more solid and has increased caloric and protein intake. Advised as soon as I obtain additional information I will pass it on to them for their records. Marcelino Duster confirmed they will continue to maintain PICC line for patient.

## 2012-05-31 NOTE — Progress Notes (Unsigned)
Faxed to [facility] medical supply signed order from Dr. Derrell Lolling for ostomy supplies for this patient. Information sent to medical records to be scanned into chart.

## 2012-06-02 ENCOUNTER — Encounter (INDEPENDENT_AMBULATORY_CARE_PROVIDER_SITE_OTHER): Payer: Self-pay | Admitting: General Surgery

## 2012-06-02 NOTE — Progress Notes (Unsigned)
Faxed to [facility] medical supplies signed authorization to provide patient with additional ostomy supplies (skin barriers). Information sent to medical records to be scanned into chart.

## 2012-06-03 ENCOUNTER — Telehealth (INDEPENDENT_AMBULATORY_CARE_PROVIDER_SITE_OTHER): Payer: Self-pay | Admitting: General Surgery

## 2012-06-03 ENCOUNTER — Telehealth (INDEPENDENT_AMBULATORY_CARE_PROVIDER_SITE_OTHER): Payer: Self-pay

## 2012-06-03 NOTE — Telephone Encounter (Signed)
Called and advised Nicholas [name] (Florentina Addison was on vacation) that the patient informed he has been gaining 1.5 to 2 lbs per week. He has been gaining in strength as well. Advised the information was given to Dr. Derrell Lolling who instructed for the picc line to be removed from the patient.

## 2012-06-03 NOTE — Telephone Encounter (Signed)
Pt called stating he is gaining 1 1/2 to 2 lbs a week and gaining in strength. Pt wants to know if Pic lines can be removed. Pt advised I will send msg with this request to Dr Derrell Lolling.

## 2012-06-07 ENCOUNTER — Encounter (INDEPENDENT_AMBULATORY_CARE_PROVIDER_SITE_OTHER): Payer: Self-pay | Admitting: General Surgery

## 2012-06-07 NOTE — Progress Notes (Unsigned)
Faxed signed authorization for removal of picc line to [phone] to the attention of Corrie Dandy at [facility] on 06/04/12. Sent to medical records to be scanned into the chart.

## 2012-06-18 ENCOUNTER — Ambulatory Visit (INDEPENDENT_AMBULATORY_CARE_PROVIDER_SITE_OTHER): Payer: Medicare Other | Admitting: Cardiology

## 2012-06-18 ENCOUNTER — Encounter: Payer: Self-pay | Admitting: Cardiology

## 2012-06-18 VITALS — BP 110/68 | HR 66 | Ht 68.0 in | Wt 137.0 lb

## 2012-06-18 DIAGNOSIS — K5090 Crohn's disease, unspecified, without complications: Secondary | ICD-10-CM

## 2012-06-18 DIAGNOSIS — D649 Anemia, unspecified: Secondary | ICD-10-CM

## 2012-06-18 DIAGNOSIS — I4891 Unspecified atrial fibrillation: Secondary | ICD-10-CM

## 2012-06-18 DIAGNOSIS — I480 Paroxysmal atrial fibrillation: Secondary | ICD-10-CM

## 2012-06-18 NOTE — Progress Notes (Signed)
Nicholas [name] Date of Birth:  1941-10-05 [facility] [address] [place], [place]  [address] [phone]  Fax   [phone]  HPI: This pleasant 71 year old gentleman is seen for a six-month followup office visit.  He is a past history of paroxysmal atrial fibrillation.  He also has a past history of mild aortic stenosis.  He has had Crohn's disease since he was a teenager. Current Outpatient Prescriptions  Medication Sig Dispense Refill  . aspirin 81 MG EC tablet Take 81 mg by mouth daily.        . digoxin (LANOXIN) 0.25 MG tablet Take 250 mcg by mouth daily.      . fenofibrate micronized (LOFIBRA) 200 MG capsule Take 200 mg by mouth daily before breakfast.        . ferrous sulfate 325 (65 FE) MG tablet Take 325 mg by mouth 2 (two) times daily.       . metoprolol (LOPRESSOR) 50 MG tablet Take 25 mg by mouth 2 (two) times daily.       . mirtazapine (REMERON) 15 MG tablet as directed.      . Multiple Vitamin (MULTIVITAMIN) tablet Take 1 tablet by mouth daily. Per patient it is the generic version of centrum silver.      . multivitamin-lutein (OCUVITE-LUTEIN) CAPS Take 1 capsule by mouth daily.        No Known Allergies  Patient Active Problem List  Diagnosis  . Aortic stenosis, mild  . Paroxysmal atrial fibrillation  . Crohn's disease  . Nonspecific (abnormal) findings on radiological and other examination of gastrointestinal tract  . Abdominal pain, right lower quadrant    History  Smoking status  . Former Smoker  . Quit date: 11/10/1964  Smokeless tobacco  . Never Used    History  Alcohol Use No    Family History  Problem Relation Age of Onset  . Heart disease Maternal Grandfather   . Heart disease      maternal side  . Colon cancer Neg Hx     Review of Systems: The patient denies any heat or cold intolerance.  No weight gain or weight loss.  The patient denies headaches or blurry vision.  There is no cough or sputum production.  The  patient denies dizziness.  There is no hematuria or hematochezia.  The patient denies any muscle aches or arthritis.  The patient denies any rash.  The patient denies frequent falling or instability.  There is no history of depression or anxiety.  All other systems were reviewed and are negative.   Physical Exam: Filed Vitals:   06/18/12 1449  BP: 110/68  Pulse: 66   the general appearance reveals a well-developed well-nourished thin gentleman who has lost 15 pounds since we last saw him.  He is slightly pale The head and neck exam reveals pupils equal and reactive.  Extraocular movements are full.  There is no scleral icterus.  The mouth and pharynx are normal.  The neck is supple.  The carotids reveal no bruits.  The jugular venous pressure is normal.  The  thyroid is not enlarged.  There is no lymphadenopathy.  The chest is clear to percussion and auscultation.  There are no rales or rhonchi.  Expansion of the chest is symmetrical.  The precordium is quiet.  The first heart sound is normal.  The second heart sound is physiologically split.  There is no murmur gallop rub or click.  There is no abnormal lift or  heave.  The abdomen is soft and nontender.  End ileostomy is present The bowel sounds are normal.   There are no abdominal bruits.  Extremities reveal good pedal pulses.  There is no phlebitis or edema.  There is no cyanosis or clubbing.  Strength is normal and symmetrical in all extremities.  There is no lateralizing weakness.  There are no sensory deficits.  The skin is warm and dry.  There is no rash.      Assessment / Plan:  Continue same medication.  Recheck in 6 months for followup office visit and EKG.

## 2012-06-18 NOTE — Patient Instructions (Addendum)
Your physician recommends that you continue on your current medications as directed. Please refer to the Current Medication list given to you today.  Your physician wants you to follow-up in: 6 months. You will receive a reminder letter in the mail two months in advance. If you don't receive a letter, please call our office to schedule the follow-up appointment.  

## 2012-06-18 NOTE — Assessment & Plan Note (Signed)
The patient has not been experiencing any recurrent atrial fibrillation.  No chest pain or angina

## 2012-06-18 NOTE — Assessment & Plan Note (Signed)
Since last visit the patient has had a flareup of his Crohn's disease.  He recently spent 17 days in [place].  He had to undergo intestinal surgery by Dr. Derrell Lolling.  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  He is hopeful that eventually the ileostomy can be reversed.  He is being followed also by a gastroenterology specialist dealing in Crohn's disease who was at [facility].

## 2012-06-23 ENCOUNTER — Encounter (INDEPENDENT_AMBULATORY_CARE_PROVIDER_SITE_OTHER): Payer: Self-pay | Admitting: General Surgery

## 2012-06-23 ENCOUNTER — Ambulatory Visit (INDEPENDENT_AMBULATORY_CARE_PROVIDER_SITE_OTHER): Payer: Medicare Other | Admitting: General Surgery

## 2012-06-23 VITALS — BP 102/56 | HR 56 | Temp 97.4°F | Resp 20 | Ht 69.0 in | Wt 135.2 lb

## 2012-06-23 DIAGNOSIS — K5090 Crohn's disease, unspecified, without complications: Secondary | ICD-10-CM

## 2012-06-23 NOTE — Patient Instructions (Signed)
You have gained 10 pounds in the last month according to my scales, which is excellent. You are  encouraged to continue to take high potency multivitamins daily and try to gain 10 pounds over the next 3 months.  There is no restriction on  physical activities from here on out, but use common sense and do not overdo it.  Return to see Dr. Derrell Lolling in 3 months. We will discuss options for management of your ileostomy, including closure of ileostomy at that time.

## 2012-06-23 NOTE — Progress Notes (Signed)
Patient ID: Nicholas [name], male   DOB: [birth year], 71 y.o.   MRN: [id number]  Nicholas [name] is a 71 y.o. male. He returns for followup of his disease, and recent abdominal surgery.   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 He was placed on hyperalimentation and antibiotics. On April 16, 2012 he underwent subtotal colectomy, resection of terminal ileal mass, Brooke ileostomy. He was maintained on hyperalimentation until the day of discharge which was June 17.   Because of weight loss we were planning to put him on home hyperalimentation. That was denied by insurance. Fortunately his appetite is picked up and he has gained 10 pounds over the past month.  He doesn't have any abdominal pain. He has no difficulty using a riding lawnmower. He emties his ileostomy about 10 times a day, he states that sometimes the stool is very pasty.  He has seen his gastroenterologist, Dr. Raeanne Barry at [facility]. He has stated that he does not need medical therapy for Crohn's at this time. He states that if we  keep the ileostomy that he would not need any further therapy, but that if he elects  to have the ileostomy taken down he would discuss the possibility of immunomodulators. Vitamin B12 level was checked at [facility] and was normal at 447 pcg/mL. Albumin was 4.3.    Exam: Patient looks well. Thin. Weight 135, 10 pounds weight gain over one month.  Abdomen soft. Flat. Nontender. Midline incision almost completely healed. His one tiny area with a 2 mm area of granulation tissue. No periods. Ileostomy healthy, pink, good blood, good fit of ostomy appliance.    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 Nutritional status improving. Vitamin B12 level  normal.    Plan: High potency multivitamins, nutritional supplements, try to gain 10 pounds over the next 3 months.  Return to see me in 3 months. We will discuss options for ileostomy reversal at that time. He states that he desires to have ileostomy reversed.   Angelia Mould. Derrell Lolling, M.D., [facility] General and Minimally invasive Surgery Breast and Colorectal Surgery Office:   [phone] Pager:   [phone]

## 2012-07-06 ENCOUNTER — Encounter (INDEPENDENT_AMBULATORY_CARE_PROVIDER_SITE_OTHER): Payer: Self-pay | Admitting: General Surgery

## 2012-07-06 NOTE — Progress Notes (Signed)
Faxed signed order to [facility] to pull picc. Faxed to # [phone]. Sent to medical records to be scanned under "orders"

## 2012-07-08 ENCOUNTER — Encounter (INDEPENDENT_AMBULATORY_CARE_PROVIDER_SITE_OTHER): Payer: Self-pay | Admitting: General Surgery

## 2012-07-08 NOTE — Progress Notes (Signed)
Faxed signed order for pouch, drainable with barrier to [facility] supplies fax # [phone]. Sent to medical records to be scanned under orders.

## 2012-07-08 NOTE — Progress Notes (Signed)
Faxed back to [facility] nutrition order with notation from Dr. Derrell Lolling stating it was never started. Faxed to [phone].

## 2012-07-14 ENCOUNTER — Encounter (INDEPENDENT_AMBULATORY_CARE_PROVIDER_SITE_OTHER): Payer: Self-pay | Admitting: General Surgery

## 2012-07-14 NOTE — Progress Notes (Signed)
Faxed signed authorization to [facility] fax # [phone] for "pouch, drainable with barrier". Send to medical records to be scanned to chart as orders.

## 2012-09-23 ENCOUNTER — Ambulatory Visit (INDEPENDENT_AMBULATORY_CARE_PROVIDER_SITE_OTHER): Payer: Medicare Other | Admitting: General Surgery

## 2012-09-23 ENCOUNTER — Encounter (INDEPENDENT_AMBULATORY_CARE_PROVIDER_SITE_OTHER): Payer: Self-pay | Admitting: General Surgery

## 2012-09-23 VITALS — BP 122/70 | HR 60 | Temp 98.2°F | Resp 16 | Ht 69.0 in | Wt 142.8 lb

## 2012-09-23 DIAGNOSIS — K5090 Crohn's disease, unspecified, without complications: Secondary | ICD-10-CM

## 2012-09-23 NOTE — Patient Instructions (Signed)
You look better. Your protein levels are back to normal and you have gained weight.  I think it is reasonable to consider an operation for reversal of the ileostomy.  An appointment will be made to see me in the office in the near future for a flexible sigmoidoscopy to assess the health and the length of the residual rectal segment. If that looks good we will make plans to proceed with the surgery to reverse the  ileostomy in January.

## 2012-09-23 NOTE — Progress Notes (Signed)
Patient ID: Nicholas [name], male   DOB: [birth year], 71 y.o.   MRN: [id number] History: This gentleman had complicated Crohn's disease. He was hospitalized on 04/09/2012 with inflammatory mass in the right lower quadrant, ileosigmoid fistula, and chronic pan colonic fecal impaction. On 04/16/2012 he underwent subtotal colectomy, resection of ileal mass, and Brooke ileostomy. And he require hyperalimentation until he was discharged home. He has slowly improved. His albumin level is back to normal. His blood work looks good. His weight is up to 142 pounds.  Ileostomy output is improved, semi-formed and better controlled now. He is not requiring any medication to slow his bowel movements down. He wants  to have his ileostomy closed. He has an appointment to see his gastroenterologist, Dr. Raeanne Barry at [facility] in early December. He has been taken off of the digoxin  but remains on Lopressor because of his paroxysmal atrial fibrillation. He is being followed closely by Dr. Jarome Matin for his medical problems.  Exam: Patient looks good. In no distress. Weight 142 pounds. BP 122/70. Pulse 60 and slightly irregular. Temp 98.2. Respirations 16 Neck no adenopathy, no mass, no jugular venous distention Lungs clear to auscultation bilaterally Heart irregular rhythm. Normal right. No at no murmur. Abdomen soft. Nontender. Lower midline incision well-healed. Ileostomy right lower quadrant with good mucosa good but no hernia rectal exam deferred  Assessment: Complicated, recurrent Crohn's disease Status post urgent hospitalization and major ileocolonic resection with Brook ileostomy, 04/10/2012  Protein calorie malnutrition, resolved Paroxysmal atrial fibrillation  Plan: He would like to proceed to plan to have his ileostomy closed. I discussed the technical details of that. He is aware that he has a short rectal segment and will have multiple stools per day but I think he can manage this. Return  to see me in a few weeks for flexible sigmoidoscopy so that I can determine the health and the length of his rectal segment He will see his gastroenterologist at [facility], Dr. Steele Sizer, in early December to make sure that he is comfortable that his Crohn's disease is stable If everything seems appropriate, we'll plan to do his surgery in January to take down his ileostomy and do an ileo-rectal anastomosis.   Angelia Mould. Derrell Lolling, M.D., [facility] General and Minimally invasive Surgery Breast and Colorectal Surgery Office:   [phone] Pager:   [phone]

## 2012-09-24 ENCOUNTER — Telehealth: Payer: Self-pay | Admitting: Gastroenterology

## 2012-09-24 NOTE — Telephone Encounter (Signed)
Dr. Derrell Lolling has advised the patient that he needs a tap water enema, but he is asking where and how to perform. This is a patient of Dr. Lewie Loron at [facility].  I have asked him to please call Dr. Jacinto Halim office for clarification on all his instructions.

## 2012-09-28 ENCOUNTER — Telehealth (INDEPENDENT_AMBULATORY_CARE_PROVIDER_SITE_OTHER): Payer: Self-pay

## 2012-09-28 NOTE — Telephone Encounter (Signed)
Patient calling regarding the pre-op prep.  Patient states he has called several pharmacies for the 500 cc enema and he's told they do not have them available.  Patient just need's instruction discussed with him in further detail.

## 2012-09-30 ENCOUNTER — Telehealth (INDEPENDENT_AMBULATORY_CARE_PROVIDER_SITE_OTHER): Payer: Self-pay | Admitting: General Surgery

## 2012-09-30 NOTE — Telephone Encounter (Signed)
Called patient back per message left regarding rectal prep. Advised patient that per Dr. Derrell Lolling he can use a couple fleets enema prior to the sigmoidoscopy and nothing by mouth prior. Dr. Derrell Lolling originally wanted the patient to obtain 500 cc enema, but no pharmacy has it available. A copy of the rectal prep instructions mailed to patient.

## 2012-10-25 ENCOUNTER — Encounter (INDEPENDENT_AMBULATORY_CARE_PROVIDER_SITE_OTHER): Payer: Self-pay | Admitting: General Surgery

## 2012-10-25 ENCOUNTER — Ambulatory Visit (INDEPENDENT_AMBULATORY_CARE_PROVIDER_SITE_OTHER): Payer: Medicare Other | Admitting: General Surgery

## 2012-10-25 VITALS — BP 118/82 | HR 80 | Temp 97.6°F | Resp 16 | Ht 69.0 in | Wt 141.6 lb

## 2012-10-25 DIAGNOSIS — K5090 Crohn's disease, unspecified, without complications: Secondary | ICD-10-CM

## 2012-10-25 DIAGNOSIS — Z932 Ileostomy status: Secondary | ICD-10-CM

## 2012-10-25 NOTE — Progress Notes (Signed)
Faxed request for cardiac clearance to the attention of Dr. Patty Sermons (fax # [phone]). Faxed snapshot and office notes from two office visits (10/25/12 and 09/23/12). Confirmation received. Patient to be scheduled for resection and closure of ileostomy once clearance has been received.

## 2012-10-25 NOTE — Patient Instructions (Signed)
You will be scheduled for surgery to reverse her ileostomy.  You are  to go on a clear liquid diet for 48 prior to the surgery  Please give yourself 2 fleets enemas, twice a day for 3 days prior to the surgery.

## 2012-10-25 NOTE — Progress Notes (Signed)
Patient ID: Nicholas [name], male   DOB: May 07, 1941, 71 y.o.   MRN: [id number]  Chief Complaint  Patient presents with  . Other    flex sig    HPI Nicholas [name] is a 71 y.o. male.  He returns for flexible sigmoidoscopy and preop planning.  This patient has complicated Crohn's disease. He was hospitalized on 04/09/2012 with an inflammatory mass in the right lower quadrant, a chronic ileosigmoid fistula, and chronic pan colonic fecal impaction. Basically the rest of the colon was defunctionalized and had rockhard stool from the ileocecal valve to the mid sigmoid. He underwent hyperalimentation preop. On 04/16/2012 he underwent subtotal colectomy, resection of ileal mass, Brooke ileostomy, and stapling of the rectum above the peritoneal reflection. He had lost a lot of weight. As an an outpatient he slowly improved and gained weight back to normal.  He continues to followup with his gastroenterologist, Dr. Raeanne Barry at [facility] and saw him about 2 weeks ago. He will followup with him in April, assuming that we do the ileostomy closure.  He is taken a couple of enemas and is here for flexible sigmoidoscopy. He is still motivated to have his ileostomy closed. He is being followed closely by Dr. Jarome Matin for his internal medical problems. He remains on Lopressor because of paroxysmal atrial fibrillation. HPI  Past Medical History  Diagnosis Date  . Hyperlipidemia   . PAF (paroxysmal atrial fibrillation)   . Aortic stenosis, mild   . Crohn's disease   . PAC (premature atrial contraction)   . Palpitations   . Macular degeneration   . Cataract   . Colonic hemorrhage 1964    Past Surgical History  Procedure Date  . Tonsillectomy   . Stomach surgery 1959    ulcer  . US echocardiography 05/20/2010    EF 55-60%  . US echocardiography 10/23/2005    EF 55-60%  . Cardiovascular stress test 11/29/2003    EF 64%  . Appendectomy   . Bladder repair 1964    intestine  leaks/bladder repair  . Laparotomy 04/16/2012    Procedure: EXPLORATORY LAPAROTOMY;  Surgeon: Ernestene Mention, MD;  Location: [facility] OR;  Service: General;  Laterality: N/A;  . Ileostomy 04/16/2012    Procedure: ILEOSTOMY;  Surgeon: Ernestene Mention, MD;  Location: [facility] OR;  Service: General;  Laterality: N/A;    Family History  Problem Relation Age of Onset  . Heart disease Maternal Grandfather   . Heart disease      maternal side  . Colon cancer Neg Hx     Social History History  Substance Use Topics  . Smoking status: Former Smoker    Quit date: 11/10/1964  . Smokeless tobacco: Never Used  . Alcohol Use: No    No Known Allergies  Current Outpatient Prescriptions  Medication Sig Dispense Refill  . aspirin 81 MG EC tablet Take 81 mg by mouth daily.        . fenofibrate micronized (LOFIBRA) 200 MG capsule Take 200 mg by mouth daily before breakfast.        . ferrous sulfate 325 (65 FE) MG tablet Take 325 mg by mouth 2 (two) times daily.       . metoprolol (LOPRESSOR) 50 MG tablet Take 25 mg by mouth daily.       . mirtazapine (REMERON) 15 MG tablet as directed.      . Multiple Vitamin (MULTIVITAMIN) tablet Take 1 tablet by mouth daily. Per patient it is the  generic version of centrum silver.      . multivitamin-lutein (OCUVITE-LUTEIN) CAPS Take 1 capsule by mouth daily.      [name] omeprazole (PRILOSEC) 20 MG capsule Take 20 mg by mouth daily.        Review of Systems Review of Systems  Constitutional: Negative for fever, chills and unexpected weight change.  HENT: Negative for hearing loss, congestion, sore throat, trouble swallowing and voice change.   Eyes: Negative for visual disturbance.  Respiratory: Negative for cough and wheezing.   Cardiovascular: Negative for chest pain, palpitations and leg swelling.  Gastrointestinal: Negative for nausea, vomiting, abdominal pain, diarrhea, constipation, blood in stool, abdominal distention, anal bleeding and rectal pain.  Genitourinary:  Negative for hematuria and difficulty urinating.  Musculoskeletal: Negative for arthralgias.  Skin: Negative for rash and wound.  Neurological: Negative for seizures, syncope, weakness and headaches.  Hematological: Negative for adenopathy. Does not bruise/bleed easily.  Psychiatric/Behavioral: Negative for confusion.    Blood pressure 118/82, pulse 80, temperature 97.6 F (36.4 C), temperature source Temporal, resp. rate 16, height 5\' 9"  (1.753 m), weight 141 lb 9.6 oz (64.229 kg), SpO2 98.00%.  Physical Exam Physical Exam  Constitutional: He is oriented to person, place, and time. He appears well-developed and well-nourished. No distress.  HENT:  Head: Normocephalic.  Nose: Nose normal.  Mouth/Throat: No oropharyngeal exudate.  Eyes: Conjunctivae normal and EOM are normal. Pupils are equal, round, and reactive to light. Right eye exhibits no discharge. Left eye exhibits no discharge. No scleral icterus.  Neck: Normal range of motion. Neck supple. No JVD present. No tracheal deviation present. No thyromegaly present.  Cardiovascular: Normal rate, regular rhythm, normal heart sounds and intact distal pulses.   No murmur heard. Pulmonary/Chest: Effort normal and breath sounds normal. No stridor. No respiratory distress. He has no wheezes. He has no rales. He exhibits no tenderness.  Abdominal: Soft. Bowel sounds are normal. He exhibits no distension and no mass. There is no tenderness. There is no rebound and no guarding.       Lower midline incision well-healed. Ileostomy right lower quadrant. No hernia.  Genitourinary:       Rectal exam reveals increased sphincter tone, somewhat uncomfortable to do digital exam. Flexible sigmoidoscopy performed. The prep was not that great. There was still stool present. I was able to get the flexible sigmoidoscope up to 18 cm, however. Mucosa looked okay.  Musculoskeletal: Normal range of motion. He exhibits no edema and no tenderness.  Lymphadenopathy:     He has no cervical adenopathy.  Neurological: He is alert and oriented to person, place, and time. He has normal reflexes. Coordination normal.  Skin: Skin is warm and dry. No rash noted. He is not diaphoretic. No erythema. No pallor.  Psychiatric: He has a normal mood and affect. His behavior is normal. Judgment and thought content normal.    Data Reviewed Dr. Lewie Loron office notes. My prior operative note.  Assessment    Complicated, recurrent Crohn's disease.  Status post urgent hospitalization and major ileocolic resection with Brook ileostomy, 04/10/2012  Protein calorie malnutrition, resolved  Paroxysmal atrial fibrillation, in regular rhythm today.    Plan    Flexible sigmoidoscopy performed today.  We'll schedule for  laparotomy, resection and closure of his ileostomy at [facility] in the near future.  He is told to take 2 Fleet's enemas at a time, and to do this twice a day for 3 days prior to the surgery.  He is instructed to  take a clear liquid diet for 48 hours before the surgery  I discussed the indications, details, techniques, and numerous risks of the surgery with him. We talked about bowel frequency, intra-abdominal complications, need for vitamin B12 supplementation and so forth. He understands these issues. His questions were answered. He agrees with this plan. He knows that he can keep ileostomy if he wished.  We will ask for cardiac clearance with Dr. Romilda Joy. Derrell Lolling, M.D., [facility] General and Minimally invasive Surgery Breast and Colorectal Surgery Office:   [phone] Pager:   [phone]  10/25/2012, 9:38 AM

## 2012-10-29 ENCOUNTER — Encounter (INDEPENDENT_AMBULATORY_CARE_PROVIDER_SITE_OTHER): Payer: Self-pay | Admitting: General Surgery

## 2012-10-29 NOTE — Progress Notes (Unsigned)
Received medical clearance from Dr. Patty Sermons. Copy made and attached to surgical order, taken to schedulers. The original sent to medical records to be scanned into the chart.

## 2012-11-09 ENCOUNTER — Ambulatory Visit (INDEPENDENT_AMBULATORY_CARE_PROVIDER_SITE_OTHER): Payer: Medicare Other | Admitting: Nurse Practitioner

## 2012-11-09 ENCOUNTER — Encounter: Payer: Self-pay | Admitting: Nurse Practitioner

## 2012-11-09 VITALS — BP 140/75 | HR 60 | Ht 69.0 in | Wt 145.8 lb

## 2012-11-09 DIAGNOSIS — I350 Nonrheumatic aortic (valve) stenosis: Secondary | ICD-10-CM

## 2012-11-09 DIAGNOSIS — I359 Nonrheumatic aortic valve disorder, unspecified: Secondary | ICD-10-CM

## 2012-11-09 DIAGNOSIS — I480 Paroxysmal atrial fibrillation: Secondary | ICD-10-CM

## 2012-11-09 DIAGNOSIS — Z01818 Encounter for other preprocedural examination: Secondary | ICD-10-CM

## 2012-11-09 DIAGNOSIS — I4891 Unspecified atrial fibrillation: Secondary | ICD-10-CM

## 2012-11-09 NOTE — Progress Notes (Signed)
Nicholas [name] Date of Birth: [birth year] Medical Record #[id number]  History of Present Illness: Nicholas [name] is seen today for a pre op clearance. He is seen for Dr. Patty Sermons. He has PAF, has had long standing Crohn's and aortic stenosis. He had to have a colostomy back in the summer and is planning on reversal in late January.   He comes in today. He is here with his wife. He seems to be doing well. No chest pain. Tries to stay active. Not dizzy or lightheaded. Has noticed a few times of a irregular heart beat on his BP machine. He is no longer on Digoxin, apparently due to his kidney function. Has not had stress testing since 2005. Last echo in 2011.   Current Outpatient Prescriptions on File Prior to Visit  Medication Sig Dispense Refill  . aspirin 81 MG EC tablet Take 81 mg by mouth daily.        . fenofibrate micronized (LOFIBRA) 200 MG capsule Take 200 mg by mouth daily before breakfast.        . ferrous sulfate 325 (65 FE) MG tablet Take 325 mg by mouth 2 (two) times daily.       . metoprolol (LOPRESSOR) 50 MG tablet Take 25 mg by mouth 2 (two) times daily.       . Multiple Vitamin (MULTIVITAMIN) tablet Take 1 tablet by mouth daily. Per patient it is the generic version of centrum silver.      . multivitamin-lutein (OCUVITE-LUTEIN) CAPS Take 1 capsule by mouth daily.      [name] omeprazole (PRILOSEC) 20 MG capsule Take 20 mg by mouth daily.        No Known Allergies  Past Medical History  Diagnosis Date  . Hyperlipidemia   . PAF (paroxysmal atrial fibrillation)   . Aortic stenosis, mild   . Crohn's disease   . PAC (premature atrial contraction)   . Palpitations   . Macular degeneration   . Cataract   . Colonic hemorrhage 1964    Past Surgical History  Procedure Date  . Tonsillectomy   . Stomach surgery 1959    ulcer  . US echocardiography 05/20/2010    EF 55-60%  . US echocardiography 10/23/2005    EF 55-60%  . Cardiovascular stress test 11/29/2003    EF 64%  .  Appendectomy   . Bladder repair 1964    intestine leaks/bladder repair  . Laparotomy 04/16/2012    Procedure: EXPLORATORY LAPAROTOMY;  Surgeon: Ernestene Mention, MD;  Location: [facility] OR;  Service: General;  Laterality: N/A;  . Ileostomy 04/16/2012    Procedure: ILEOSTOMY;  Surgeon: Ernestene Mention, MD;  Location: [facility] OR;  Service: General;  Laterality: N/A;    History  Smoking status  . Former Smoker  . Quit date: 11/10/1964  Smokeless tobacco  . Never Used    History  Alcohol Use No    Family History  Problem Relation Age of Onset  . Heart disease Maternal Grandfather   . Heart disease      maternal side  . Colon cancer Neg Hx     Review of Systems: The review of systems is per the HPI.  All other systems were reviewed and are negative.  Physical Exam: BP 140/75  Pulse 60  Ht 5\' 9"  (1.753 m)  Wt 145 lb 12.8 oz (66.134 kg)  BMI 21.53 kg/m2 Patient is very pleasant and in no acute distress. Skin is warm and dry. Color is normal.  HEENT  is unremarkable. Normocephalic/atraumatic. PERRL. Sclera are nonicteric. Neck is supple. No masses. No JVD. Lungs are clear. Cardiac exam shows a regular rate and rhythm. He has a holosystolic murmur noted.  Abdomen is soft. Extremities are without edema. Gait and ROM are intact. No gross neurologic deficits noted.  LABORATORY DATA: EKG shows sinus bradycardia.   Assessment / Plan:  1. Pre op clearance - he will be having colon surgery with Dr. Derrell Lolling later in January with general anesthesia. We will update his stress test.  2. Aortic stenosis - need to update his echo. No cardinal symptoms.  3. PAF - in sinus today.   We will tentatively see him back in about 4 months. He will be an acceptable candidate for his surgery if his studies are satisfactory.   Patient is agreeable to this plan and will call if any problems develop in the interim.

## 2012-11-09 NOTE — Patient Instructions (Addendum)
We are going to update your ultrasound of your heart and your stress test.  If these tests look ok, we will let you proceed on with your colon surgery  We will see you back in about 4 months  Call the [facility] office at [phone] if you have any questions, problems or concerns.

## 2012-11-12 ENCOUNTER — Ambulatory Visit (HOSPITAL_COMMUNITY): Payer: Medicare Other | Attending: Cardiology

## 2012-11-12 DIAGNOSIS — I4891 Unspecified atrial fibrillation: Secondary | ICD-10-CM

## 2012-11-12 DIAGNOSIS — Z01818 Encounter for other preprocedural examination: Secondary | ICD-10-CM

## 2012-11-12 DIAGNOSIS — I350 Nonrheumatic aortic (valve) stenosis: Secondary | ICD-10-CM

## 2012-11-12 DIAGNOSIS — I369 Nonrheumatic tricuspid valve disorder, unspecified: Secondary | ICD-10-CM | POA: Insufficient documentation

## 2012-11-12 DIAGNOSIS — I480 Paroxysmal atrial fibrillation: Secondary | ICD-10-CM

## 2012-11-12 DIAGNOSIS — Z01810 Encounter for preprocedural cardiovascular examination: Secondary | ICD-10-CM | POA: Insufficient documentation

## 2012-11-12 NOTE — Progress Notes (Signed)
Echocardiogram performed.  

## 2012-11-17 ENCOUNTER — Ambulatory Visit (HOSPITAL_COMMUNITY): Payer: Medicare Other | Attending: Cardiovascular Disease | Admitting: Radiology

## 2012-11-17 VITALS — Ht 69.0 in | Wt 146.0 lb

## 2012-11-17 DIAGNOSIS — R002 Palpitations: Secondary | ICD-10-CM | POA: Insufficient documentation

## 2012-11-17 DIAGNOSIS — Z8249 Family history of ischemic heart disease and other diseases of the circulatory system: Secondary | ICD-10-CM | POA: Insufficient documentation

## 2012-11-17 DIAGNOSIS — Z01818 Encounter for other preprocedural examination: Secondary | ICD-10-CM

## 2012-11-17 DIAGNOSIS — R0602 Shortness of breath: Secondary | ICD-10-CM

## 2012-11-17 DIAGNOSIS — R0609 Other forms of dyspnea: Secondary | ICD-10-CM | POA: Insufficient documentation

## 2012-11-17 DIAGNOSIS — I480 Paroxysmal atrial fibrillation: Secondary | ICD-10-CM

## 2012-11-17 DIAGNOSIS — R0989 Other specified symptoms and signs involving the circulatory and respiratory systems: Secondary | ICD-10-CM | POA: Insufficient documentation

## 2012-11-17 HISTORY — PX: CARDIOVASCULAR STRESS TEST: SHX262

## 2012-11-17 MED ORDER — TECHNETIUM TC 99M SESTAMIBI GENERIC - CARDIOLITE
32.9000 | Freq: Once | INTRAVENOUS | Status: AC | PRN
  Administered 2012-11-17: 32.9 via INTRAVENOUS

## 2012-11-17 MED ORDER — TECHNETIUM TC 99M SESTAMIBI GENERIC - CARDIOLITE
11.0000 | Freq: Once | INTRAVENOUS | Status: AC | PRN
  Administered 2012-11-17: 11 via INTRAVENOUS

## 2012-11-17 NOTE — Progress Notes (Signed)
[facility] SITE [address] NUCLEAR MED [address] [place], [place] [address] [phone]    Cardiology Nuclear Med Study  Nicholas [name] is a 71 y.o. male     MRN : [id number]     DOB: 05-01-1941  Procedure Date: 11/17/2012  Nuclear Med Background Indication for Stress Test:  Evaluation for Ischemia and Surgical Clearance for  colostomy reversal by Dr.Haywood Derrell Lolling 12/07/12 History:  No prior known history of CAD, PAF, '05 Myocardial Perfusion Study-Normal, EF=64%, and '11 Echo: EF=55-60%, mild AS Cardiac Risk Factors: Family History - CAD, History of Smoking and Lipids  Symptoms:  DOE and Palpitations   Nuclear Pre-Procedure Caffeine/Decaff Intake:  None NPO After: 6 pm   Lungs:  clear O2 Sat: 99% on room air. IV 0.9% NS with Angio Cath:  22g  IV Site: R Antecubital  IV Started by:  Bonnita Levan, RN  Chest Size (in):  44 Cup Size: n/a  Height: 5\' 9"  (1.753 m)  Weight:  146 lb (66.225 kg)  BMI:  Body mass index is 21.56 kg/(m^2). Tech Comments:  Held Lopressor x 24 hrs. Patient took lopressor after recovery.    Nuclear Med Study 1 or 2 day study: 1 day  Stress Test Type:  Stress  Reading MD: Kristeen Miss, MD  Order Authorizing Provider:  Cassell Clement, MD, and Norma Fredrickson, NP  Resting Radionuclide: Technetium 79m Sestamibi  Resting Radionuclide Dose: 11.0 mCi   Stress Radionuclide:  Technetium 32m Sestamibi  Stress Radionuclide Dose: 32.9 mCi           Stress Protocol Rest HR: 59 Stress HR: 148  Rest BP: 140/74 Stress BP: 212/76  Exercise Time (min): 6:31 METS: 7.7   Predicted Max HR: 147 bpm % Max HR: 100.68 bpm Rate Pressure Product: 21308    Dose of Adenosine (mg):  n/a Dose of Lexiscan: n/a mg  Dose of Atropine (mg): n/a Dose of Dobutamine: n/a mcg/kg/min (at max HR)  Stress Test Technologist: Irean Hong, RN  Nuclear Technologist:  Domenic Polite, CNMT     Rest Procedure:  Myocardial perfusion imaging was performed at rest 45 minutes following  the intravenous administration of Technetium 59m Sestamibi. Rest ECG: NSR - Normal EKG  Stress Procedure:  The patient exercised on the treadmill utilizing the Bruce Protocol for 6:31 minutes, RPE=15. The patient stopped due to DOE, bilateral leg fatigue 8/10 and denied any chest pain. There was a hypertensive response to exercise. There were frequent PJC's, and rare PAC, PVC. Technetium 35m Sestamibi was injected at peak exercise and myocardial perfusion imaging was performed after a brief delay. Stress ECG: No significant change from baseline ECG  QPS Raw Data Images:  Mild diaphragmatic attenuation.  Normal left ventricular size. Stress Images:  There is a large mild area of attenuation along the entire inferior wall.  The prefusion in the remaining segments is normal.       Rest Images:  There is a large mild area of attenuation along the entire inferior wall.  The prefusion in the remaining segments is normal.   Subtraction (SDS):  No evidence of ischemia. Transient Ischemic Dilatation (Normal <1.22):  1.16 Lung/Heart Ratio (Normal <0.45):  0.22  Quantitative Gated Spect Images QGS EDV:  88 ml QGS ESV:  30 ml  Impression Exercise Capacity:  Good exercise capacity. BP Response:  Hypertensive blood pressure response. Clinical Symptoms:  No significant symptoms noted. ECG Impression:  No significant ST segment change suggestive of ischemia. Comparison with Prior Nuclear  Study: No images to compare  Overall Impression:  Normal stress nuclear study. There is mild attenuation of the inferior wall that appears to be due to diaphragmatic attenuation.  There is no evidence of ischemia.   LV Ejection Fraction: 66%.  LV Wall Motion:  NL LV Function; NL Wall Motion.    Vesta Mixer, Montez Hageman., MD, [facility] 11/17/2012, 5:47 PM Office - [phone] Pager [phone]

## 2012-11-19 ENCOUNTER — Telehealth: Payer: Self-pay | Admitting: *Deleted

## 2012-11-19 ENCOUNTER — Encounter: Payer: Self-pay | Admitting: *Deleted

## 2012-11-19 NOTE — Telephone Encounter (Signed)
Message copied by Burnell Blanks on Fri Nov 19, 2012  8:40 AM ------      Message from: Cassell Clement      Created: Sun Nov 14, 2012  3:54 PM       Please report.  2D echo is normal.  EF 60-65%.  Aortic valve normal. Awaiting stress test results then we can clear him for surgery.

## 2012-11-19 NOTE — Telephone Encounter (Signed)
Message copied by Burnell Blanks on Fri Nov 19, 2012  8:40 AM ------      Message from: Cassell Clement      Created: Thu Nov 18, 2012 12:46 PM       Please report to patient.  The stress test was normal.  We can send surgical clearance to Dr. Derrell Lolling.

## 2012-11-19 NOTE — Telephone Encounter (Signed)
Advised patient and will send to Dr Derrell Lolling

## 2012-11-25 ENCOUNTER — Encounter (HOSPITAL_COMMUNITY): Payer: Self-pay

## 2012-12-01 ENCOUNTER — Encounter (HOSPITAL_COMMUNITY): Payer: Self-pay

## 2012-12-01 ENCOUNTER — Encounter (HOSPITAL_COMMUNITY)
Admission: RE | Admit: 2012-12-01 | Discharge: 2012-12-01 | Disposition: A | Discharge status: Home / Self Care | Payer: Medicare Other | Source: Ambulatory Visit | Attending: General Surgery | Admitting: General Surgery

## 2012-12-01 ENCOUNTER — Other Ambulatory Visit (INDEPENDENT_AMBULATORY_CARE_PROVIDER_SITE_OTHER): Payer: Self-pay | Admitting: General Surgery

## 2012-12-01 ENCOUNTER — Telehealth (INDEPENDENT_AMBULATORY_CARE_PROVIDER_SITE_OTHER): Payer: Self-pay

## 2012-12-01 HISTORY — DX: Cardiac murmur, unspecified: R01.1

## 2012-12-01 HISTORY — DX: Gastro-esophageal reflux disease without esophagitis: K21.9

## 2012-12-01 HISTORY — DX: Anemia, unspecified: D64.9

## 2012-12-01 LAB — CBC
HCT: 37.8 % — ABNORMAL LOW (ref 39.0–52.0)
Hemoglobin: 12.9 g/dL — ABNORMAL LOW (ref 13.0–17.0)
MCHC: 34.1 g/dL (ref 30.0–36.0)
RBC: 4.14 MIL/uL — ABNORMAL LOW (ref 4.22–5.81)

## 2012-12-01 LAB — BASIC METABOLIC PANEL
BUN: 14 mg/dL (ref 6–23)
CO2: 23 mEq/L (ref 19–32)
GFR calc non Af Amer: 53 mL/min — ABNORMAL LOW (ref >90)
Glucose, Bld: 91 mg/dL (ref 70–99)
Potassium: 4.8 mEq/L (ref 3.5–5.1)

## 2012-12-01 NOTE — Progress Notes (Signed)
SPOKE WITH DR. Derrell Lolling WHO STATED PATIENT WILL NEED ENTEREG PREOP AND HE WILL PLACE ORDER.

## 2012-12-01 NOTE — Pre-Procedure Instructions (Signed)
HUZAIFA VINEY  12/01/2012   Your procedure is scheduled on: Tuesday  12/07/12  Report to [facility] at 530 AM.  Call this number if you have problems the morning of surgery: [phone]   Remember:   Do not eat food or drink liquids after midnight.   Take these medicines the morning of surgery with A SIP OF WATER: METOPROLOL(LOPRESSOR), PRILOSEC ( STOP ASPIRIN,BLOOD THINNERS, HERBAL MEDICINES)   Do not wear jewelry, make-up or nail polish.  Do not wear lotions, powders, or perfumes. You may wear deodorant.  Do not shave 48 hours prior to surgery. Men may shave face and neck.  Do not bring valuables to the hospital.  Contacts, dentures or bridgework may not be worn into surgery.  Leave suitcase in the car. After surgery it may be brought to your room.  For patients admitted to the hospital, checkout time is 11:00 AM the day of  discharge.   Patients discharged the day of surgery will not be allowed to drive  home.  Name and phone number of your driver:   Special Instructions: Shower using CHG 2 nights before surgery and the night before surgery.  If you shower the day of surgery use CHG.  Use special wash - you have one bottle of CHG for all showers.  You should use approximately 1/3 of the bottle for each shower.   Please read over the following fact sheets that you were given: Pain Booklet, Coughing and Deep Breathing, MRSA Information and Surgical Site Infection Prevention

## 2012-12-01 NOTE — Telephone Encounter (Signed)
Jennie from short stay is questioning if entereg should of been orderd with pre op orders. States it is usually ordered with colon surgeries. You can call her back at [phone].

## 2012-12-06 MED ORDER — DEXTROSE 5 % IV SOLN
2.0000 g | INTRAVENOUS | Status: AC
  Administered 2012-12-07: 2 g via INTRAVENOUS
  Filled 2012-12-06: qty 2

## 2012-12-06 MED ORDER — ALVIMOPAN 12 MG PO CAPS
12.0000 mg | ORAL_CAPSULE | Freq: Once | ORAL | Status: AC
  Administered 2012-12-07: 12 mg via ORAL
  Filled 2012-12-06 (×2): qty 1

## 2012-12-06 NOTE — H&P (Signed)
Nicholas [name]     MRN: [id number]   Description: 72 year old male  Provider: Ernestene Mention, MD  Department: [facility]        Diagnoses     Crohn's disease   - Primary    555.9    Ileostomy in place     V44.2      Reason for Visit     Other    flex sig        Vitals    BP Pulse Temp Resp Ht Wt    118/82 80 97.6 F (36.4 C) (Temporal) 16 5\' 9"  (1.753 m) 141 lb 9.6 oz (64.229 kg)    BMI -20.91 kg/m2 98%               History and Physical     Ernestene Mention, MD   Patient ID: Nicholas [name], male   DOB: 02/18/1941, 72 y.o.   MRN: [id number]               HPI [name] is a 72 y.o. male.  He returns for flexible sigmoidoscopy and preop planning.   This patient has complicated Crohn's disease. He was hospitalized on 04/09/2012 with an inflammatory mass in the right lower quadrant, a chronic ileosigmoid fistula, and chronic pan colonic fecal impaction. Basically the rest of the colon was defunctionalized and had rockhard stool from the ileocecal valve to the mid sigmoid. He underwent hyperalimentation preop. On 04/16/2012 he underwent subtotal colectomy, resection of ileal mass, Brooke ileostomy, and stapling of the rectum above the peritoneal reflection. He had lost a lot of weight. As an an outpatient he slowly improved and gained weight back to normal.   He continues to followup with his gastroenterologist, Dr. Raeanne Barry at [facility] and saw him about 2 weeks ago. He will followup with him in April, assuming that we do the ileostomy closure.   He is taken a couple of enemas and is here for flexible sigmoidoscopy. He is still motivated to have his ileostomy closed. He is being followed closely by Dr. Jarome Matin for his internal medical problems. He remains on Lopressor because of paroxysmal atrial fibrillation.       Past Medical History   Diagnosis  Date   .  Hyperlipidemia     .  PAF (paroxysmal atrial fibrillation)       .  Aortic stenosis, mild     .  Crohn's disease     .  PAC (premature atrial contraction)     .  Palpitations     .  Macular degeneration     .  Cataract     .  Colonic hemorrhage  1964       Past Surgical History   Procedure  Date   .  Tonsillectomy     .  Stomach surgery  1959       ulcer   .  US echocardiography  05/20/2010       EF 55-60%   .  US echocardiography  10/23/2005       EF 55-60%   .  Cardiovascular stress test  11/29/2003       EF 64%   .  Appendectomy     .  Bladder repair  1964       intestine leaks/bladder repair   .  Laparotomy  04/16/2012       Procedure: EXPLORATORY LAPAROTOMY;  Surgeon: Ernestene Mention, MD;  Location: [facility] OR;  Service: General;  Laterality: N/A;   .  Ileostomy  04/16/2012       Procedure: ILEOSTOMY;  Surgeon: Ernestene Mention, MD;  Location: [facility] OR;  Service: General;  Laterality: N/A;       Family History   Problem  Relation  Age of Onset   .  Heart disease  Maternal Grandfather     .  Heart disease           maternal side   .  Colon cancer  Neg Hx        Social History History   Substance Use Topics   .  Smoking status:  Former Smoker       Quit date:  11/10/1964   .  Smokeless tobacco:  Never Used   .  Alcohol Use:  No      No Known Allergies    Current Outpatient Prescriptions   Medication  Sig  Dispense  Refill   .  aspirin 81 MG EC tablet  Take 81 mg by mouth daily.           .  fenofibrate micronized (LOFIBRA) 200 MG capsule  Take 200 mg by mouth daily before breakfast.           .  ferrous sulfate 325 (65 FE) MG tablet  Take 325 mg by mouth 2 (two) times daily.          .  metoprolol (LOPRESSOR) 50 MG tablet  Take 25 mg by mouth daily.          .  mirtazapine (REMERON) 15 MG tablet  as directed.         .  Multiple Vitamin (MULTIVITAMIN) tablet  Take 1 tablet by mouth daily. Per patient it is the generic version of centrum silver.         .  multivitamin-lutein (OCUVITE-LUTEIN) CAPS  Take 1 capsule by mouth daily.          [name]  omeprazole (PRILOSEC) 20 MG capsule  Take 20 mg by mouth daily.            Review of Systems  Constitutional: Negative for fever, chills and unexpected weight change.  HENT: Negative for hearing loss, congestion, sore throat, trouble swallowing and voice change.   Eyes: Negative for visual disturbance.  Respiratory: Negative for cough and wheezing.   Cardiovascular: Negative for chest pain, palpitations and leg swelling.  Gastrointestinal: Negative for nausea, vomiting, abdominal pain, diarrhea, constipation, blood in stool, abdominal distention, anal bleeding and rectal pain.  Genitourinary: Negative for hematuria and difficulty urinating.  Musculoskeletal: Negative for arthralgias.  Skin: Negative for rash and wound.  Neurological: Negative for seizures, syncope, weakness and headaches.  Hematological: Negative for adenopathy. Does not bruise/bleed easily.  Psychiatric/Behavioral: Negative for confusion.    Blood pressure 118/82, pulse 80, temperature 97.6 F (36.4 C), temperature source Temporal, resp. rate 16, height 5\' 9"  (1.753 m), weight 141 lb 9.6 oz (64.229 kg), SpO2 98.00%.   Physical Exam  Constitutional: He is oriented to person, place, and time. He appears well-developed and well-nourished. No distress.  HENT:   Head: Normocephalic.   Nose: Nose normal.   Mouth/Throat: No oropharyngeal exudate.  Eyes: Conjunctivae normal and EOM are normal. Pupils are equal, round, and reactive to light. Right eye exhibits no discharge. Left eye exhibits no discharge. No scleral icterus.  Neck: Normal range of motion. Neck supple. No JVD present. No tracheal deviation present. No thyromegaly  present.  Cardiovascular: Normal rate, regular rhythm, normal heart sounds and intact distal pulses.    No murmur heard. Pulmonary/Chest: Effort normal and breath sounds normal. No stridor. No respiratory distress. He has no wheezes. He has no rales. He exhibits no tenderness.  Abdominal:  Soft. Bowel sounds are normal. He exhibits no distension and no mass. There is no tenderness. There is no rebound and no guarding.       Lower midline incision well-healed. Ileostomy right lower quadrant. No hernia.  Genitourinary:       Rectal exam reveals increased sphincter tone, somewhat uncomfortable to do digital exam. Flexible sigmoidoscopy performed. The prep was not that great. There was still stool present. I was able to get the flexible sigmoidoscope up to 18 cm, however. Mucosa looked okay.  Musculoskeletal: Normal range of motion. He exhibits no edema and no tenderness.  Lymphadenopathy:    He has no cervical adenopathy.  Neurological: He is alert and oriented to person, place, and time. He has normal reflexes. Coordination normal.  Skin: Skin is warm and dry. No rash noted. He is not diaphoretic. No erythema. No pallor.  Psychiatric: He has a normal mood and affect. His behavior is normal. Judgment and thought content normal.    Data Reviewed Dr. Lewie Loron office notes. My prior operative note.   Assessment Complicated, recurrent Crohn's disease.   Status post urgent hospitalization and major ileocolic resection with Brook ileostomy, 04/10/2012   Protein calorie malnutrition, resolved   Paroxysmal atrial fibrillation, in regular rhythm today.   Plan Flexible sigmoidoscopy performed today.   We'll schedule for  laparotomy, resection and closure of his ileostomy at [facility] in the near future.   He is told to take 2 Fleet's enemas at a time, and to do this twice a day for 3 days prior to the surgery.   He is instructed to take a clear liquid diet for 48 hours before the surgery   I discussed the indications, details, techniques, and numerous risks of the surgery with him. We talked about bowel frequency, intra-abdominal complications, need for vitamin B12 supplementation and so forth. He understands these issues. His questions were answered. He agrees  with this plan. He knows that he can keep ileostomy if he wished.   We will ask for cardiac clearance with Dr. Romilda Joy. Derrell Lolling, M.D., [facility] General and Minimally invasive Surgery Breast and Colorectal Surgery Office:   [phone] Pager:   [phone]

## 2012-12-07 ENCOUNTER — Encounter (HOSPITAL_COMMUNITY): Payer: Self-pay | Admitting: General Practice

## 2012-12-07 ENCOUNTER — Encounter (HOSPITAL_COMMUNITY)
Admission: RE | Disposition: A | Discharge status: Home / Self Care | Payer: Self-pay | Source: Ambulatory Visit | Attending: General Surgery

## 2012-12-07 ENCOUNTER — Encounter (HOSPITAL_COMMUNITY): Payer: Self-pay | Admitting: Anesthesiology

## 2012-12-07 ENCOUNTER — Inpatient Hospital Stay (HOSPITAL_COMMUNITY): Payer: Medicare Other | Admitting: Anesthesiology

## 2012-12-07 ENCOUNTER — Inpatient Hospital Stay (HOSPITAL_COMMUNITY)
Admission: RE | Admit: 2012-12-07 | Discharge: 2012-12-13 | DRG: 331 | Disposition: A | Discharge status: Home / Self Care | Payer: Medicare Other | Source: Ambulatory Visit | Attending: General Surgery | Admitting: General Surgery

## 2012-12-07 ENCOUNTER — Encounter (HOSPITAL_COMMUNITY): Payer: Self-pay | Admitting: Surgery

## 2012-12-07 DIAGNOSIS — I4891 Unspecified atrial fibrillation: Secondary | ICD-10-CM | POA: Diagnosis present

## 2012-12-07 DIAGNOSIS — I4719 Other supraventricular tachycardia: Secondary | ICD-10-CM

## 2012-12-07 DIAGNOSIS — I359 Nonrheumatic aortic valve disorder, unspecified: Secondary | ICD-10-CM | POA: Diagnosis present

## 2012-12-07 DIAGNOSIS — Z932 Ileostomy status: Secondary | ICD-10-CM

## 2012-12-07 DIAGNOSIS — K5289 Other specified noninfective gastroenteritis and colitis: Principal | ICD-10-CM | POA: Diagnosis present

## 2012-12-07 DIAGNOSIS — E785 Hyperlipidemia, unspecified: Secondary | ICD-10-CM | POA: Diagnosis present

## 2012-12-07 DIAGNOSIS — K277 Chronic peptic ulcer, site unspecified, without hemorrhage or perforation: Secondary | ICD-10-CM | POA: Diagnosis present

## 2012-12-07 DIAGNOSIS — I2510 Atherosclerotic heart disease of native coronary artery without angina pectoris: Secondary | ICD-10-CM | POA: Diagnosis present

## 2012-12-07 DIAGNOSIS — Z79899 Other long term (current) drug therapy: Secondary | ICD-10-CM

## 2012-12-07 DIAGNOSIS — Z87891 Personal history of nicotine dependence: Secondary | ICD-10-CM

## 2012-12-07 DIAGNOSIS — I471 Supraventricular tachycardia: Secondary | ICD-10-CM

## 2012-12-07 DIAGNOSIS — Z432 Encounter for attention to ileostomy: Secondary | ICD-10-CM

## 2012-12-07 DIAGNOSIS — K660 Peritoneal adhesions (postprocedural) (postinfection): Secondary | ICD-10-CM

## 2012-12-07 DIAGNOSIS — I480 Paroxysmal atrial fibrillation: Secondary | ICD-10-CM

## 2012-12-07 DIAGNOSIS — K5090 Crohn's disease, unspecified, without complications: Secondary | ICD-10-CM | POA: Diagnosis present

## 2012-12-07 DIAGNOSIS — Z7982 Long term (current) use of aspirin: Secondary | ICD-10-CM

## 2012-12-07 HISTORY — PX: ILEOSTOMY CLOSURE: SHX1784

## 2012-12-07 HISTORY — PX: PROCTOSCOPY: SHX2266

## 2012-12-07 HISTORY — PX: LYSIS OF ADHESION: SHX5961

## 2012-12-07 LAB — CREATININE, SERUM
Creatinine, Ser: 1.07 mg/dL (ref 0.50–1.35)
GFR calc non Af Amer: 67 mL/min — ABNORMAL LOW (ref >90)

## 2012-12-07 LAB — CBC
HCT: 33.7 % — ABNORMAL LOW (ref 39.0–52.0)
Hemoglobin: 11.2 g/dL — ABNORMAL LOW (ref 13.0–17.0)
MCH: 30.6 pg (ref 26.0–34.0)
MCHC: 33.2 g/dL (ref 30.0–36.0)
RDW: 13.1 % (ref 11.5–15.5)

## 2012-12-07 SURGERY — CLOSURE, ILEOSTOMY
Anesthesia: General | Site: Rectum | Wound class: Clean Contaminated

## 2012-12-07 MED ORDER — GLYCOPYRROLATE 0.2 MG/ML IJ SOLN
INTRAMUSCULAR | Status: DC | PRN
  Administered 2012-12-07: 0.4 mg via INTRAVENOUS
  Administered 2012-12-07: .1 mg via INTRAVENOUS
  Administered 2012-12-07: 0.1 mg via INTRAVENOUS

## 2012-12-07 MED ORDER — HYDROMORPHONE HCL PF 1 MG/ML IJ SOLN
INTRAMUSCULAR | Status: AC
  Filled 2012-12-07: qty 1

## 2012-12-07 MED ORDER — HYDROMORPHONE 0.3 MG/ML IV SOLN
INTRAVENOUS | Status: AC
  Filled 2012-12-07: qty 25

## 2012-12-07 MED ORDER — SODIUM CHLORIDE 0.9 % IJ SOLN: 9.0000 mL | INTRAMUSCULAR | Status: DC | PRN

## 2012-12-07 MED ORDER — LIDOCAINE HCL 4 % MT SOLN
OROMUCOSAL | Status: DC | PRN
  Administered 2012-12-07: 4 mL via TOPICAL

## 2012-12-07 MED ORDER — MIDAZOLAM HCL 5 MG/5ML IJ SOLN
INTRAMUSCULAR | Status: DC | PRN
  Administered 2012-12-07: 2 mg via INTRAVENOUS

## 2012-12-07 MED ORDER — FENTANYL CITRATE 0.05 MG/ML IJ SOLN: 50.0000 ug | Freq: Once | INTRAMUSCULAR | Status: DC

## 2012-12-07 MED ORDER — LACTATED RINGERS IV SOLN
INTRAVENOUS | Status: DC | PRN
  Administered 2012-12-07 (×3): via INTRAVENOUS

## 2012-12-07 MED ORDER — OXYCODONE HCL 5 MG/5ML PO SOLN: 5.0000 mg | Freq: Once | ORAL | Status: DC | PRN

## 2012-12-07 MED ORDER — METOPROLOL TARTRATE 1 MG/ML IV SOLN
5.0000 mg | Freq: Four times a day (QID) | INTRAVENOUS | Status: DC
  Administered 2012-12-08 – 2012-12-11 (×11): 5 mg via INTRAVENOUS
  Filled 2012-12-07 (×19): qty 5

## 2012-12-07 MED ORDER — FENTANYL CITRATE 0.05 MG/ML IJ SOLN
INTRAMUSCULAR | Status: DC | PRN
  Administered 2012-12-07 (×2): 75 ug via INTRAVENOUS
  Administered 2012-12-07 (×3): 50 ug via INTRAVENOUS

## 2012-12-07 MED ORDER — ACETAMINOPHEN 10 MG/ML IV SOLN
INTRAVENOUS | Status: DC | PRN
  Administered 2012-12-07: 1000 mg via INTRAVENOUS

## 2012-12-07 MED ORDER — MIDAZOLAM HCL 2 MG/2ML IJ SOLN: 1.0000 mg | INTRAMUSCULAR | Status: DC | PRN

## 2012-12-07 MED ORDER — DIPHENHYDRAMINE HCL 12.5 MG/5ML PO ELIX
12.5000 mg | ORAL_SOLUTION | Freq: Four times a day (QID) | ORAL | Status: DC | PRN
  Filled 2012-12-07: qty 5

## 2012-12-07 MED ORDER — ACETAMINOPHEN 10 MG/ML IV SOLN
INTRAVENOUS | Status: AC
  Filled 2012-12-07: qty 100

## 2012-12-07 MED ORDER — ONDANSETRON HCL 4 MG/2ML IJ SOLN
INTRAMUSCULAR | Status: DC | PRN
  Administered 2012-12-07: 4 mg via INTRAVENOUS

## 2012-12-07 MED ORDER — ONDANSETRON HCL 4 MG PO TABS: 4.0000 mg | ORAL_TABLET | Freq: Four times a day (QID) | ORAL | Status: DC | PRN

## 2012-12-07 MED ORDER — ALVIMOPAN 12 MG PO CAPS
12.0000 mg | ORAL_CAPSULE | Freq: Two times a day (BID) | ORAL | Status: DC
  Administered 2012-12-08 – 2012-12-10 (×6): 12 mg via ORAL
  Filled 2012-12-07 (×8): qty 1

## 2012-12-07 MED ORDER — ROCURONIUM BROMIDE 100 MG/10ML IV SOLN
INTRAVENOUS | Status: DC | PRN
  Administered 2012-12-07: 50 mg via INTRAVENOUS

## 2012-12-07 MED ORDER — DIPHENHYDRAMINE HCL 50 MG/ML IJ SOLN: 12.5000 mg | Freq: Four times a day (QID) | INTRAMUSCULAR | Status: DC | PRN

## 2012-12-07 MED ORDER — DEXTROSE 5 % IV SOLN
1.0000 g | Freq: Three times a day (TID) | INTRAVENOUS | Status: AC
  Administered 2012-12-07 – 2012-12-08 (×3): 1 g via INTRAVENOUS
  Filled 2012-12-07 (×3): qty 1

## 2012-12-07 MED ORDER — PANTOPRAZOLE SODIUM 40 MG IV SOLR
40.0000 mg | Freq: Two times a day (BID) | INTRAVENOUS | Status: DC
  Administered 2012-12-07 – 2012-12-10 (×8): 40 mg via INTRAVENOUS
  Filled 2012-12-07 (×10): qty 40

## 2012-12-07 MED ORDER — HEPARIN SODIUM (PORCINE) 5000 UNIT/ML IJ SOLN
5000.0000 [IU] | Freq: Once | INTRAMUSCULAR | Status: AC
  Administered 2012-12-07: 5000 [IU] via SUBCUTANEOUS

## 2012-12-07 MED ORDER — OXYCODONE HCL 5 MG PO TABS: 5.0000 mg | ORAL_TABLET | Freq: Once | ORAL | Status: DC | PRN

## 2012-12-07 MED ORDER — PROMETHAZINE HCL 25 MG/ML IJ SOLN: 6.2500 mg | INTRAMUSCULAR | Status: DC | PRN

## 2012-12-07 MED ORDER — VECURONIUM BROMIDE 10 MG IV SOLR
INTRAVENOUS | Status: DC | PRN
  Administered 2012-12-07 (×2): 1 mg via INTRAVENOUS

## 2012-12-07 MED ORDER — HYDROMORPHONE HCL PF 1 MG/ML IJ SOLN
0.2500 mg | INTRAMUSCULAR | Status: DC | PRN
  Administered 2012-12-07 (×4): 0.5 mg via INTRAVENOUS

## 2012-12-07 MED ORDER — HYDROMORPHONE 0.3 MG/ML IV SOLN
INTRAVENOUS | Status: DC
  Administered 2012-12-07 (×2): 0.9 mg via INTRAVENOUS
  Administered 2012-12-07: 11:00:00 via INTRAVENOUS
  Administered 2012-12-08: 0.6 mg via INTRAVENOUS
  Administered 2012-12-08: 11:00:00 via INTRAVENOUS
  Administered 2012-12-08: 1.5 mg via INTRAVENOUS
  Administered 2012-12-08: 0.6 mg via INTRAVENOUS
  Administered 2012-12-08: 0.3 mg via INTRAVENOUS
  Administered 2012-12-08 (×2): 1.8 mg via INTRAVENOUS
  Administered 2012-12-09: 3 mg via INTRAVENOUS
  Administered 2012-12-09: 0.6 mg via INTRAVENOUS
  Administered 2012-12-09: 0.3 mg via INTRAVENOUS
  Administered 2012-12-09: 0.6 mg via INTRAVENOUS
  Administered 2012-12-10: 0.3 mg via INTRAVENOUS
  Filled 2012-12-07: qty 25

## 2012-12-07 MED ORDER — ONDANSETRON HCL 4 MG/2ML IJ SOLN: 4.0000 mg | Freq: Four times a day (QID) | INTRAMUSCULAR | Status: DC | PRN

## 2012-12-07 MED ORDER — HEPARIN SODIUM (PORCINE) 5000 UNIT/ML IJ SOLN
5000.0000 [IU] | Freq: Three times a day (TID) | INTRAMUSCULAR | Status: DC
  Administered 2012-12-08 – 2012-12-13 (×15): 5000 [IU] via SUBCUTANEOUS
  Filled 2012-12-07 (×19): qty 1

## 2012-12-07 MED ORDER — NEOSTIGMINE METHYLSULFATE 1 MG/ML IJ SOLN
INTRAMUSCULAR | Status: DC | PRN
  Administered 2012-12-07: 3.5 mg via INTRAVENOUS

## 2012-12-07 MED ORDER — PROPOFOL 10 MG/ML IV BOLUS
INTRAVENOUS | Status: DC | PRN
  Administered 2012-12-07: 140 mg via INTRAVENOUS

## 2012-12-07 MED ORDER — ACETAMINOPHEN 10 MG/ML IV SOLN
1000.0000 mg | Freq: Once | INTRAVENOUS | Status: DC
Start: 2012-12-07 — End: 2012-12-07

## 2012-12-07 MED ORDER — POTASSIUM CHLORIDE IN NACL 20-0.9 MEQ/L-% IV SOLN
INTRAVENOUS | Status: DC
  Administered 2012-12-07 – 2012-12-10 (×8): via INTRAVENOUS
  Administered 2012-12-10: 100 mL/h via INTRAVENOUS
  Administered 2012-12-11: 20 mL/h via INTRAVENOUS
  Filled 2012-12-07 (×13): qty 1000

## 2012-12-07 MED ORDER — HYDROMORPHONE HCL PF 1 MG/ML IJ SOLN
INTRAMUSCULAR | Status: DC | PRN
  Administered 2012-12-07: 0.5 mg via INTRAVENOUS
  Administered 2012-12-07: .5 mg via INTRAVENOUS

## 2012-12-07 MED ORDER — OXYCODONE-ACETAMINOPHEN 5-325 MG PO TABS
1.0000 | ORAL_TABLET | ORAL | Status: DC | PRN
  Administered 2012-12-10 – 2012-12-11 (×3): 2 via ORAL
  Filled 2012-12-07 (×4): qty 2

## 2012-12-07 MED ORDER — LIDOCAINE HCL (CARDIAC) 20 MG/ML IV SOLN
INTRAVENOUS | Status: DC | PRN
  Administered 2012-12-07: 60 mg via INTRAVENOUS

## 2012-12-07 MED ORDER — ONDANSETRON HCL 4 MG/2ML IJ SOLN
4.0000 mg | Freq: Four times a day (QID) | INTRAMUSCULAR | Status: DC | PRN
  Administered 2012-12-07: 4 mg via INTRAVENOUS
  Filled 2012-12-07: qty 2

## 2012-12-07 MED ORDER — 0.9 % SODIUM CHLORIDE (POUR BTL) OPTIME
TOPICAL | Status: DC | PRN
  Administered 2012-12-07 (×2): 1000 mL

## 2012-12-07 MED ORDER — HEPARIN SODIUM (PORCINE) 5000 UNIT/ML IJ SOLN
INTRAMUSCULAR | Status: AC
  Administered 2012-12-07: 5000 [IU] via SUBCUTANEOUS
  Filled 2012-12-07: qty 1

## 2012-12-07 MED ORDER — NALOXONE HCL 0.4 MG/ML IJ SOLN: 0.4000 mg | INTRAMUSCULAR | Status: DC | PRN

## 2012-12-07 MED ORDER — ALBUMIN HUMAN 5 % IV SOLN
INTRAVENOUS | Status: DC | PRN
  Administered 2012-12-07 (×2): via INTRAVENOUS

## 2012-12-07 SURGICAL SUPPLY — 65 items
BLADE SURG ROTATE 9660 (MISCELLANEOUS) ×1 IMPLANT
BRR ADH 5X3 SEPRAFILM 6 SHT (MISCELLANEOUS) ×2
CANISTER SUCTION 2500CC (MISCELLANEOUS) ×5 IMPLANT
CLOTH BEACON ORANGE TIMEOUT ST (SAFETY) ×3 IMPLANT
DRAIN CHANNEL 19F RND (DRAIN) ×1 IMPLANT
DRAPE LAPAROSCOPIC ABDOMINAL (DRAPES) ×3 IMPLANT
DRAPE PROXIMA HALF (DRAPES) ×2 IMPLANT
DRAPE UTILITY 15X26 W/TAPE STR (DRAPE) ×6 IMPLANT
DRAPE WARM FLUID 44X44 (DRAPE) ×3 IMPLANT
DRESSING TELFA 8X3 (GAUZE/BANDAGES/DRESSINGS) ×1 IMPLANT
ELECT BLADE 6.5 EXT (BLADE) ×1 IMPLANT
ELECT CAUTERY BLADE 6.4 (BLADE) ×3 IMPLANT
ELECT REM PT RETURN 9FT ADLT (ELECTROSURGICAL) ×3
ELECTRODE REM PT RTRN 9FT ADLT (ELECTROSURGICAL) ×2 IMPLANT
EVACUATOR SILICONE 100CC (DRAIN) ×1 IMPLANT
GEL ULTRASOUND 20GR AQUASONIC (MISCELLANEOUS) ×1 IMPLANT
GLOVE BIO SURGEON STRL SZ7 (GLOVE) ×1 IMPLANT
GLOVE BIOGEL PI IND STRL 6.5 (GLOVE) IMPLANT
GLOVE BIOGEL PI IND STRL 7.0 (GLOVE) IMPLANT
GLOVE BIOGEL PI IND STRL 8 (GLOVE) IMPLANT
GLOVE BIOGEL PI INDICATOR 6.5 (GLOVE) ×2
GLOVE BIOGEL PI INDICATOR 7.0 (GLOVE) ×5
GLOVE BIOGEL PI INDICATOR 8 (GLOVE) ×2
GLOVE ECLIPSE 6.5 STRL STRAW (GLOVE) ×1 IMPLANT
GLOVE EUDERMIC 7 POWDERFREE (GLOVE) ×6 IMPLANT
GLOVE SURG SS PI 7.0 STRL IVOR (GLOVE) ×4 IMPLANT
GOWN PREVENTION PLUS XLARGE (GOWN DISPOSABLE) ×6 IMPLANT
GOWN STRL NON-REIN LRG LVL3 (GOWN DISPOSABLE) ×7 IMPLANT
KIT ROOM TURNOVER OR (KITS) ×3 IMPLANT
LEGGING LITHOTOMY PAIR STRL (DRAPES) ×1 IMPLANT
LIGASURE IMPACT 36 18CM CVD LR (INSTRUMENTS) ×3 IMPLANT
NS IRRIG 1000ML POUR BTL (IV SOLUTION) ×8 IMPLANT
PACK GENERAL/GYN (CUSTOM PROCEDURE TRAY) ×3 IMPLANT
PAD ARMBOARD 7.5X6 YLW CONV (MISCELLANEOUS) ×5 IMPLANT
SEPRAFILM PROCEDURAL PACK 3X5 (MISCELLANEOUS) ×1 IMPLANT
SPECIMEN JAR MEDIUM (MISCELLANEOUS) ×1 IMPLANT
SPECIMEN JAR SMALL (MISCELLANEOUS) ×2 IMPLANT
SPECIMEN JAR X LARGE (MISCELLANEOUS) ×2 IMPLANT
SPONGE GAUZE 4X4 12PLY (GAUZE/BANDAGES/DRESSINGS) ×5 IMPLANT
SPONGE LAP 18X18 X RAY DECT (DISPOSABLE) ×3 IMPLANT
STAPLER CIRC ILS CVD 25MM (STAPLE) ×1 IMPLANT
STAPLER PROXIMATE 75MM BLUE (STAPLE) ×1 IMPLANT
STAPLER VISISTAT 35W (STAPLE) ×3 IMPLANT
SUCTION POOLE TIP (SUCTIONS) ×3 IMPLANT
SURGILUBE 2OZ TUBE FLIPTOP (MISCELLANEOUS) ×1 IMPLANT
SUT ETHILON 2 0 FS 18 (SUTURE) ×1 IMPLANT
SUT NOV 1 T60/GS (SUTURE) IMPLANT
SUT NOVA NAB DX-16 0-1 5-0 T12 (SUTURE) ×1 IMPLANT
SUT PDS AB 1 CT  36 (SUTURE)
SUT PDS AB 1 CT 36 (SUTURE) IMPLANT
SUT PDS AB 1 TP1 96 (SUTURE) ×2 IMPLANT
SUT PROLENE 0 CT 1 CR/8 (SUTURE) IMPLANT
SUT SILK 2 0 SH CR/8 (SUTURE) ×3 IMPLANT
SUT SILK 2 0 TIES 10X30 (SUTURE) ×3 IMPLANT
SUT SILK 3 0 SH CR/8 (SUTURE) ×3 IMPLANT
SUT SILK 3 0 TIES 10X30 (SUTURE) ×3 IMPLANT
SUT VIC AB 0 CT1 27 (SUTURE) ×3
SUT VIC AB 0 CT1 27XBRD ANBCTR (SUTURE) IMPLANT
TOWEL OR 17X24 6PK STRL BLUE (TOWEL DISPOSABLE) ×3 IMPLANT
TOWEL OR 17X26 10 PK STRL BLUE (TOWEL DISPOSABLE) ×4 IMPLANT
TRAY FOLEY CATH 14FRSI W/METER (CATHETERS) ×1 IMPLANT
TRAY PROCTOSCOPIC FIBER OPTIC (SET/KITS/TRAYS/PACK) ×1 IMPLANT
TUBE CONNECTING 12X1/4 (SUCTIONS) ×1 IMPLANT
WATER STERILE IRR 1000ML POUR (IV SOLUTION) ×2 IMPLANT
YANKAUER SUCT BULB TIP NO VENT (SUCTIONS) ×3 IMPLANT

## 2012-12-07 NOTE — Op Note (Addendum)
Patient Name:           Nicholas [name]   Date of Surgery:        12/07/2012  Pre op Diagnosis:      Crohn's disease, status post ileo-colic resection with ileostomy  Post op Diagnosis:    Same  Procedure:                 Resection and closure of ileostomy, ileoproctostomy, extensive lysis of adhesions requiring 75 minutes  Surgeon:                     Nicholas [name]. Nicholas [name], M.D., FACS  Assistant:                      Karie Soda, M.D., FACS  Operative Indications:   Nicholas [name] is a 72 y.o. male. .  This patient has complicated Crohn's disease. He was hospitalized on 04/09/2012 with an inflammatory mass in the right lower quadrant, a chronic ileosigmoid fistula, and chronic pan colonic fecal impaction. Basically the rest of the colon was defunctionalized and had rockhard stool from the ileocecal valve to the mid sigmoid. He underwent hyperalimentation preop. On 04/16/2012 he underwent subtotal colectomy, resection of ileal mass, Brooke ileostomy, and stapling of the rectum above the peritoneal reflection. He had lost a lot of weight. As an an outpatient he slowly improved and gained weight back to normal.  He continues to followup with his gastroenterologist, Dr. Raeanne Barry at [facility] [address] and saw him about 2 weeks ago. He will followup with him in April, assuming that we do the ileostomy closure. Recent flexible sigmoidoscopy revealed a reasonably healthy rectum and I could pass the flexible scope up to almost 18 cm.  He is  motivated to have his ileostomy closed. He is being followed closely by Dr. Jarome Matin for his internal medical problems. He remains on Lopressor because of paroxysmal atrial fibrillation.   Operative Findings:       The patient had extensive intra-abdominal adhesions. There was no omentum. The adhesions took 75 minutes to completely take down but ultimately I could run the small bowel from the ligament of Treitz to the ileostomy. The gallbladder and liver looked  healthy. The rectal stump was healthy and was just above the peritoneal reflection. The anastomosis was at about 13-14 cm and was performed with a 25 mm EEA. There was no evidence of active inflammatory bowel disease.  Procedure in Detail:          Following the induction of general endotracheal anesthesia I placed a purse string suture in the ileostomy. A Foley catheter was placed and the patient placed in rigid stirrups. The abdomen and perineum were prepped and draped in a sterile fashion. Intravenous antibiotics were given. Surgical time out was performed.  Midline incision was made from  the suprapubic area to above the umbilicus. The fascia was carefully incised in the midline. Abdominal cavity was slowly entered. We took 75 minutes at least to take down all the adhesions but ultimately could see all the small bowel. We ran the small bowel 3 or 4 times and there was no evidence of any enterotomy or serosal injury. We identified the rectal stump with Prolene sutures and mobilized a small amount until we had good area of the anterior wall of the stump to use for anastomosis. Using a knife I cut   around the ileostomy and mobilized it out of the abdominal wall  to return it to the abdominal  cavity. After taking all the adhesions down I found that I would need to resect about 5 inches of the the terminal ileum which would include ileostomy and that would allow the small bowel to go down into the pelvis with no tension whatsoever. There appeared to be a lot of small bowel left. I divided the Distal ileal mesentery with the LigaSure device. I divided the distal ileum with a knife between Allen clamps. I brought a 25 mm EEA stapler to the operative field and inserted into the end of the ileum. I brought the spike of the anvil out through the antimesenteric wall of the ileum and placed a pursestring suture around it of 2-0 Prolene. I stapled off the end of the ileum with the GIA stapling device. This looked well  placed and well positioned for an anastomosis.  I went below to do the proctoscopy and Dr. Michaell Cowing supervised the abdominal part of the anastomosis.. I dilated the rectum and inserted the proctoscope and found that we could pass the scope all way up to the rectal stump. The did not appear to be any active bowel disease. I inserted the 25 mm EEA stapler  up to the rectal stump and positioned it and opened the spike of the stapler through the anterior wall of the rectal stump. The anvil  attached to the ileum was inserted onto the stapler and the stapler was closed, held in place for 60 seconds fired, opened and removed. We had 2 complete donuts of tissue which were also sent to the lab. Proctoscopy was performed and we insufflated the colon. There was no air leak under water. There was no bleeding. The anastomosis looked to be at about 14 cm. The air was evacuated and the proctoscope removed.  We changed our gowns and gloves and instruments. We irrigated out the subphrenic space and the abdomen and pelvis. There was no overt  bleeding but the tissues were somewhat moist. We checked the small bowel one more time to make sure that the small bowel was well positioned and it lay very nicely and the mesentery was not twisted. Placed  A  19 [place] Blake drain down into the pelvis and brought out through a stab wound of left lower quadrant and secured that with nylon suture connected to a suction bulb.  For the ileostomy I closed the posterior rectus sheath internally with a running suture of 2-0 Vicryl. I then closed the anterior rectus sheath with multiple interrupted sutures of #1 Novofil. I placed Seprafilm on top of the small bowel. I closed the midline fascia with a running suture of double-stranded #1 PDS. All the incisions were closed loosely with skin staples and Telfa wicks and a special standardized bandage.  The patient tolerated the procedure well and was taken to recovery in stable condition. EBL and 50  cc or less. Counts correct. Complications none.     Nicholas [name]. Nicholas [name], M.D., FACS General and Minimally Invasive Surgery Breast and Colorectal Surgery  12/07/2012 10:29 AM

## 2012-12-07 NOTE — Anesthesia Preprocedure Evaluation (Signed)
Anesthesia Evaluation  Patient identified by MRN, date of birth, ID band Patient awake    Reviewed: Allergy & Precautions, H&P , NPO status , Patient's Chart, lab work & pertinent test results  Airway Mallampati: II TM Distance: >3 FB Neck ROM: full    Dental   Pulmonary  breath sounds clear to auscultation        Cardiovascular + dysrhythmias Atrial Fibrillation + Valvular Problems/Murmurs AS Rhythm:Regular Rate:Normal     Neuro/Psych    GI/Hepatic Crohn's dz   Endo/Other    Renal/GU      Musculoskeletal   Abdominal   Peds  Hematology   Anesthesia Other Findings   Reproductive/Obstetrics                           Anesthesia Physical Anesthesia Plan  ASA: III  Anesthesia Plan: General   Post-op Pain Management:    Induction: Intravenous  Airway Management Planned: Oral ETT  Additional Equipment:   Intra-op Plan:   Post-operative Plan: Extubation in OR  Informed Consent: I have reviewed the patients History and Physical, chart, labs and discussed the procedure including the risks, benefits and alternatives for the proposed anesthesia with the patient or authorized representative who has indicated his/her understanding and acceptance.     Plan Discussed with: CRNA and Surgeon  Anesthesia Plan Comments:         Anesthesia Quick Evaluation

## 2012-12-07 NOTE — Interval H&P Note (Signed)
History and Physical Interval Note:  12/07/2012 7:00 AM  Nicholas [name]  has presented today for surgery, with the diagnosis of crohns disease with ileostomy  The goals and the  various methods of treatment have been discussed with the patient and family. After consideration of risks, benefits and other options for treatment, the patient has consented to  Procedure(s) (LRB) with comments: ILEOSTOMY TAKEDOWN (N/A) - Resection and closure ileostomy, proctoscopy PROCTOSCOPY (N/A) as a surgical intervention .  The patient's history has been reviewed, patient examined today, no change in status, stable for surgery.  I have reviewed the patient's chart and labs.  Questions were answered to the patient's satisfaction.     Ernestene Mention

## 2012-12-07 NOTE — Anesthesia Postprocedure Evaluation (Signed)
  Anesthesia Post-op Note  Patient: Nicholas [name]  Procedure(s) Performed: Procedure(s) (LRB) with comments: ILEOSTOMY TAKEDOWN (N/A) LYSIS OF ADHESION (N/A) PROCTOSCOPY () - ILEO PROCTOSCOPY  Patient Location: PACU  Anesthesia Type:General  Level of Consciousness: awake  Airway and Oxygen Therapy: Patient Spontanous Breathing  Post-op Pain: mild  Post-op Assessment: Post-op Vital signs reviewed, Patient's Cardiovascular Status Stable, Respiratory Function Stable, Patent Airway, No signs of Nausea or vomiting and Pain level controlled  Post-op Vital Signs: stable  Complications: No apparent anesthesia complications

## 2012-12-07 NOTE — Transfer of Care (Signed)
Immediate Anesthesia Transfer of Care Note  Patient: Nicholas [name]  Procedure(s) Performed: Procedure(s) (LRB) with comments: ILEOSTOMY TAKEDOWN (N/A) LYSIS OF ADHESION (N/A) PROCTOSCOPY () - ILEO PROCTOSCOPY  Patient Location: PACU  Anesthesia Type:General  Level of Consciousness: responds to stimulation  Airway & Oxygen Therapy: Patient Spontanous Breathing and Patient connected to nasal cannula oxygen  Post-op Assessment: Report given to PACU RN and Post -op Vital signs reviewed and stable  Post vital signs: Reviewed and stable  Complications: No apparent anesthesia complications

## 2012-12-08 ENCOUNTER — Encounter (HOSPITAL_COMMUNITY): Payer: Self-pay | Admitting: General Surgery

## 2012-12-08 LAB — BASIC METABOLIC PANEL
CO2: 28 mEq/L (ref 19–32)
Calcium: 8.5 mg/dL (ref 8.4–10.5)
Creatinine, Ser: 1.31 mg/dL (ref 0.50–1.35)
GFR calc non Af Amer: 52 mL/min — ABNORMAL LOW (ref >90)
Glucose, Bld: 102 mg/dL — ABNORMAL HIGH (ref 70–99)

## 2012-12-08 LAB — CBC
Hemoglobin: 10.7 g/dL — ABNORMAL LOW (ref 13.0–17.0)
MCH: 30.6 pg (ref 26.0–34.0)
MCHC: 33.3 g/dL (ref 30.0–36.0)
MCV: 91.7 fL (ref 78.0–100.0)
RBC: 3.5 MIL/uL — ABNORMAL LOW (ref 4.22–5.81)

## 2012-12-08 MED ORDER — SODIUM CHLORIDE 0.9 % IV BOLUS (SEPSIS)
500.0000 mL | Freq: Once | INTRAVENOUS | Status: AC
  Administered 2012-12-08: 500 mL via INTRAVENOUS

## 2012-12-08 NOTE — Progress Notes (Signed)
1 Day Post-Op  Subjective: Stable and alert.    Heart rate 70, regular. On Lopressor.   No respiratory problems. Pain control seems very adequate with PCA. No nausea. Urine output very good.JP drainage 420 cc per 24 hours, but very thin and serosanguinous.  Hemoglobin 10.7. WBC 9600.    Objective: Vital signs in last 24 hours: Temp:  [96.8 F (36 C)-98.7 F (37.1 C)] 98.6 F (37 C) (01/29 0502) Pulse Rate:  [35-73] 70  (01/29 0502) Resp:  [6-24] 15  (01/29 0502) BP: (118-152)/(47-72) 128/49 mmHg (01/29 0502) SpO2:  [97 %-100 %] 98 % (01/29 0502) Weight:  [146 lb 15.4 oz (66.663 kg)] 146 lb 15.4 oz (66.663 kg) (01/28 1300)    Intake/Output from previous day: 01/28 0701 - 01/29 0700 In: 4727.1 [I.V.:4227.1; IV Piggyback:500] Out: 2130 [Urine:1610; Drains:420; Blood:100] Intake/Output this shift: Total I/O In: 1727.1 [I.V.:1727.1] Out: 890 [Urine:750; Drains:140]  General appearance: alert. Minimal distress. Mental status normal. Skin warm and dry. Resp: clear to auscultation bilaterally GI: abdomen soft, appropriate incisional tenderness. Not distended. Wounds covered by bandages with expected serosanguineous drainage. JP drainage very thin and watery, serosanguineous.  Lab Results:  Results for orders placed during the hospital encounter of 12/07/12 (from the past 24 hour(s))  CBC     Status: Abnormal   Collection Time   12/07/12  3:20 PM      Component Value Range   WBC 12.7 (*) 4.0 - 10.5 K/uL   RBC 3.66 (*) 4.22 - 5.81 MIL/uL   Hemoglobin 11.2 (*) 13.0 - 17.0 g/dL   HCT 40.9 (*) 81.1 - 91.4 %   MCV 92.1  78.0 - 100.0 fL   MCH 30.6  26.0 - 34.0 pg   MCHC 33.2  30.0 - 36.0 g/dL   RDW 78.2  95.6 - 21.3 %   Platelets 185  150 - 400 K/uL  CREATININE, SERUM     Status: Abnormal   Collection Time   12/07/12  3:20 PM      Component Value Range   Creatinine, Ser 1.07  0.50 - 1.35 mg/dL   GFR calc non Af Amer 67 (*) >90 mL/min   GFR calc Af Amer 77 (*) >90 mL/min  CBC      Status: Abnormal   Collection Time   12/08/12  4:50 AM      Component Value Range   WBC 9.6  4.0 - 10.5 K/uL   RBC 3.50 (*) 4.22 - 5.81 MIL/uL   Hemoglobin 10.7 (*) 13.0 - 17.0 g/dL   HCT 08.6 (*) 57.8 - 46.9 %   MCV 91.7  78.0 - 100.0 fL   MCH 30.6  26.0 - 34.0 pg   MCHC 33.3  30.0 - 36.0 g/dL   RDW 62.9  52.8 - 41.3 %   Platelets 189  150 - 400 K/uL     Studies/Results: @RISRSLT24 @     . alvimopan  12 mg Oral BID  . cefOXitin  1 g Intravenous Q8H  . heparin  5,000 Units Subcutaneous Q8H  . HYDROmorphone PCA 0.3 mg/mL   Intravenous Q4H  . metoprolol  5 mg Intravenous Q6H  . pantoprazole (PROTONIX) IV  40 mg Intravenous Q12H     Assessment/Plan: s/p Procedure(s): ILEOSTOMY TAKEDOWN LYSIS OF ADHESION PROCTOSCOPY  POD #1. Extensive adhesio lysis, resection and closure of ileostomy with ileoproctostomy. Stable. NPO  except ice chips. Entereg protocol Mobilize out of bed Incentive spirometry.  Crohn's disease, history complex ileal  resection and subtotal  colectomy for complications, obstruction and fistula formation 04/09/2012.  Also a history bladder repair, 1964, question whether this was Crohn's fistula. Details unknown.  Coronary artery disease, stable  Paroxysmal atrial fibrillation. Appears in sinus rhythm at this time. Continue Lopressor.Followed by Cassell Clement for outpatient cardiology.  Aortic stenosis, mild  Remote history of peptic ulcer disease with unknown gastric surgery. Continue double dose proton pump inhibitor      LOS: 1 day    Nicholas [name] M. Derrell Lolling, M.D., [facility] General and Minimally invasive Surgery Breast and Colorectal Surgery Office:   [phone] Pager:   [phone]  12/08/2012  . .prob

## 2012-12-08 NOTE — Progress Notes (Signed)
Placed call to MD regarding lack of urine output. Awaiting call back.

## 2012-12-09 LAB — BASIC METABOLIC PANEL
BUN: 11 mg/dL (ref 6–23)
Calcium: 8.5 mg/dL (ref 8.4–10.5)
Creatinine, Ser: 1.26 mg/dL (ref 0.50–1.35)
GFR calc Af Amer: 64 mL/min — ABNORMAL LOW (ref >90)
GFR calc non Af Amer: 55 mL/min — ABNORMAL LOW (ref >90)
Glucose, Bld: 79 mg/dL (ref 70–99)
Potassium: 4 mEq/L (ref 3.5–5.1)

## 2012-12-09 LAB — CBC
HCT: 29.1 % — ABNORMAL LOW (ref 39.0–52.0)
Hemoglobin: 9.8 g/dL — ABNORMAL LOW (ref 13.0–17.0)
MCH: 31.3 pg (ref 26.0–34.0)
MCHC: 33.7 g/dL (ref 30.0–36.0)
MCV: 93 fL (ref 78.0–100.0)
RDW: 13.6 % (ref 11.5–15.5)

## 2012-12-09 NOTE — Progress Notes (Addendum)
2 Days Post-Op  Subjective: Alert and stable. Afebrile.  HR 76.regular.Has ambulated in the hall. Foley removed this morning. Denies nausea. Thirsty, but not hungry.. Working hard on incentive spirometry, hurts to cough, vital capacity less than 1500 cc.  Urine output was low yesterday. Received 1 fluid bolus and urine output excellent now.  Labs showed a hemoglobin 9.8, WBC 6700, creatinine stable at 1.26, BUN 11, potassium 4.0.  Pathology of the resected ileostomy and colonic anastomotic ring showed no evidence of Crohn's disease or malignancy. This was discussed with the patient.    Objective: Vital signs in last 24 hours: Temp:  [98.2 F (36.8 C)-98.9 F (37.2 C)] 98.2 F (36.8 C) (01/30 0533) Pulse Rate:  [58-76] 76  (01/30 0533) Resp:  [15-20] 19  (01/30 0533) BP: (113-137)/(48-78) 135/58 mmHg (01/30 0533) SpO2:  [94 %-100 %] 98 % (01/30 0533) Last BM Date: 12/06/12  Intake/Output from previous day: 01/29 0701 - 01/30 0700 In: 3345.8 [I.V.:2845.8; IV Piggyback:500] Out: 1060 [Urine:975; Drains:85] Intake/Output this shift:     Exam: General appearance: overt. Cooperative. Minimal distress. Mental status normal. Lungs: Clear to auscultation bilaterally. Decreased breath sounds at bases. No wheeze or rhonchi. Abdomen: Soft. Nondistended. Hypoactive bowel sounds. Dressing intact. Old dry blood on bandage. As expected. JP drainage thin, serosanguineous, volume significantly decreased from yesterday.    Lab Results:  Results for orders placed during the hospital encounter of 12/07/12 (from the past 24 hour(s))  HEMOGLOBIN AND HEMATOCRIT, BLOOD     Status: Abnormal   Collection Time   12/08/12  4:53 PM      Component Value Range   Hemoglobin 11.2 (*) 13.0 - 17.0 g/dL   HCT 16.1 (*) 09.6 - 04.5 %  CBC     Status: Abnormal   Collection Time   12/09/12  5:15 AM      Component Value Range   WBC 6.7  4.0 - 10.5 K/uL   RBC 3.13 (*) 4.22 - 5.81 MIL/uL   Hemoglobin 9.8 (*)  13.0 - 17.0 g/dL   HCT 40.9 (*) 81.1 - 91.4 %   MCV 93.0  78.0 - 100.0 fL   MCH 31.3  26.0 - 34.0 pg   MCHC 33.7  30.0 - 36.0 g/dL   RDW 78.2  95.6 - 21.3 %   Platelets 143 (*) 150 - 400 K/uL  BASIC METABOLIC PANEL     Status: Abnormal   Collection Time   12/09/12  5:15 AM      Component Value Range   Sodium 144  135 - 145 mEq/L   Potassium 4.0  3.5 - 5.1 mEq/L   Chloride 112  96 - 112 mEq/L   CO2 24  19 - 32 mEq/L   Glucose, Bld 79  70 - 99 mg/dL   BUN 11  6 - 23 mg/dL   Creatinine, Ser 0.86  0.50 - 1.35 mg/dL   Calcium 8.5  8.4 - 57.8 mg/dL   GFR calc non Af Amer 55 (*) >90 mL/min   GFR calc Af Amer 64 (*) >90 mL/min     Studies/Results: @RISRSLT24 @     . alvimopan  12 mg Oral BID  . heparin  5,000 Units Subcutaneous Q8H  . HYDROmorphone PCA 0.3 mg/mL   Intravenous Q4H  . metoprolol  5 mg Intravenous Q6H  . pantoprazole (PROTONIX) IV  40 mg Intravenous Q12H     Assessment/Plan: s/p Procedure(s): ILEOSTOMY TAKEDOWN LYSIS OF ADHESION PROCTOSCOPY  POD #2. Extensive adhesio lysis, resection  and closure of ileostomy with ileoproctostomy. Stable.  Start clear liquids Entereg protocol  Increase ambulation. Voiding trial today Incentive spirometry.   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  No evidence of active disease found at surgery.  Also a history bladder repair, 1964, question whether this was Crohn's fistula. Details unknown.   Coronary artery disease, stable   Paroxysmal atrial fibrillation. Appears in sinus rhythm at this time. Continue Lopressor.Followed by Cassell Clement for outpatient cardiology.   Aortic stenosis, mild   Remote history of peptic ulcer disease with unknown gastric surgery. Continue double dose proton pump inhibitor     LOS: 2 days    Nicholas [name] M. Derrell Lolling, M.D., [facility] General and Minimally invasive  Surgery Breast and Colorectal Surgery Office:   [phone] Pager:   [phone]  12/09/2012  . .prob

## 2012-12-10 ENCOUNTER — Encounter (INDEPENDENT_AMBULATORY_CARE_PROVIDER_SITE_OTHER): Payer: Self-pay

## 2012-12-10 ENCOUNTER — Encounter (HOSPITAL_COMMUNITY): Payer: Self-pay | Admitting: Physician Assistant

## 2012-12-10 DIAGNOSIS — I4891 Unspecified atrial fibrillation: Secondary | ICD-10-CM

## 2012-12-10 DIAGNOSIS — R000 Tachycardia, unspecified: Secondary | ICD-10-CM

## 2012-12-10 MED ORDER — ASPIRIN EC 81 MG PO TBEC
81.0000 mg | DELAYED_RELEASE_TABLET | Freq: Every day | ORAL | Status: DC
  Administered 2012-12-10 – 2012-12-13 (×4): 81 mg via ORAL
  Filled 2012-12-10 (×4): qty 1

## 2012-12-10 MED ORDER — HYDROMORPHONE HCL PF 1 MG/ML IJ SOLN: 0.5000 mg | INTRAMUSCULAR | Status: DC | PRN

## 2012-12-10 NOTE — Progress Notes (Signed)
MT called me around 1730 to tell me of a 9 beat run of vtach. Pt checked asymptomatic and back in nsr.  MD called through answering service and Bjorn Loser PA called at [phone].  Informed her of event, no new orders.

## 2012-12-10 NOTE — Consult Note (Signed)
Cardiology Consult Note   Patient ID: Nicholas [name] MRN: [id number], DOB/AGE: [birth year]   Admit date: 12/07/2012 Date of Consult: 12/10/2012  Primary Physician: Garlan Fillers, MD Primary Cardiologist: Cassell Clement  Reason for consult: evaluation/management of a-fib + RVR  HPI: Nicholas [name] is a 72 y.o. male with PMHx s/f PAF, h/o mild AS in the past, Crohn's disease (s/p colostomy summer 2013) and HLD who underwent reversal on 12/07/12.   He had recently followed up with Norma Fredrickson in the office for pre-op clearance. He denied ischemic, arrhythmic or CHF-type symptoms. Digoxin had been discontinued given reduced renal function. A repeat stress test and echo was performed as noted below. He was deemed an acceptable surgical candidate given these results.    2D echo 11/12/12: LVEF 60-65%, no evidence of AS, no WMAs, structural or valvular changes  Lexiscan Myoview 11/17/12: Normal stress nuclear study, mild attenuation of the inferior wall that appears to be due to diaphragmatic attenuation. No evidence of ischemia. LVEF: 66%. NL Wall Motion.  He underwent the procedure on 12/07/12. He has done well post-op. This AM, he developed a tachycardia, in the 150s after ambulating. Documentation of this via EKG or telemetry is unavailable. Speaking with Dr. Derrell Lolling, he reports that his pulse was 75, regular this AM before ambulating. He walked with assistance, and VS indicate his rate increased to 150s. Patient reports making it back to his room and feeling a bit weak. He used the restroom and sat down. He noticed his HR was 150-180s. Shortly after, it improved to 80s. No chest pain or shortness of breath. He was started on Lopressor IV. Dr. Derrell Lolling asked that he be placed on telemetry and CEs cycled. On telemetry and EKG review, he has maintained SR. Low-dose ASA restarted today. Plan to restart Lopressor PO tomorrow.   Problem List: Past Medical History  Diagnosis Date  . Hyperlipidemia    . Aortic stenosis, mild   . Crohn's disease   . Palpitations   . Macular degeneration   . Cataract   . Colonic hemorrhage 1964  . Heart murmur   . PAF (paroxysmal atrial fibrillation)     SEES DR. Jaclene Bartelt  . PAC (premature atrial contraction)   . GERD (gastroesophageal reflux disease)   . Anemia     PO IRON     Past Surgical History  Procedure Date  . Tonsillectomy   . Stomach surgery 1959    ulcer  . US echocardiography 05/20/2010    EF 55-60%  . US echocardiography 10/23/2005    EF 55-60%  . Cardiovascular stress test 11/29/2003    EF 64%  . Appendectomy   . Bladder repair 1964    intestine leaks/bladder repair  . Laparotomy 04/16/2012    Procedure: EXPLORATORY LAPAROTOMY;  Surgeon: Ernestene Mention, MD;  Location: [facility] OR;  Service: General;  Laterality: N/A;  . Ileostomy 04/16/2012    Procedure: ILEOSTOMY;  Surgeon: Ernestene Mention, MD;  Location: [facility] OR;  Service: General;  Laterality: N/A;  . Cataract extraction w/ intraocular lens  implant, bilateral     2013  . Ileostomy closure 12/07/2012  . Ileostomy closure 12/07/2012    Procedure: ILEOSTOMY TAKEDOWN;  Surgeon: Ernestene Mention, MD;  Location: [facility] OR;  Service: General;  Laterality: N/A;  . Lysis of adhesion 12/07/2012    Procedure: LYSIS OF ADHESION;  Surgeon: Ernestene Mention, MD;  Location: [facility] OR;  Service: General;  Laterality: N/A;  . Proctoscopy 12/07/2012  Procedure: PROCTOSCOPY;  Surgeon: Ernestene Mention, MD;  Location: [facility] OR;  Service: General;;  ILEO PROCTOSCOPY  . Cardiovascular stress test 11/17/2012    No evidence of ischemia; EF 66%; inferior wall attenuation favored to rep diaphragmatic attenuation     Allergies: No Known Allergies  Home Medications: Prior to Admission medications   Medication Sig Start Date End Date Taking? Authorizing Provider  aspirin 81 MG EC tablet Take 81 mg by mouth every evening.    Yes Historical Provider, MD  fenofibrate micronized (LOFIBRA) 200 MG capsule Take 200 mg by  mouth daily before breakfast.     Yes Historical Provider, MD  ferrous sulfate 325 (65 FE) MG tablet Take 325 mg by mouth 2 (two) times daily.    Yes Historical Provider, MD  metoprolol (LOPRESSOR) 50 MG tablet Take 25 mg by mouth every morning.  11/14/11  Yes Historical Provider, MD  Multiple Vitamin (MULTIVITAMIN) tablet Take 1 tablet by mouth daily. centrum silver.   Yes Historical Provider, MD  multivitamin-lutein (OCUVITE-LUTEIN) CAPS Take 1 capsule by mouth daily. For senior   Yes Historical Provider, MD  omeprazole (PRILOSEC) 20 MG capsule Take 20 mg by mouth daily as needed. Acid reflux   Yes Historical Provider, MD    Inpatient Medications:     . alvimopan  12 mg Oral BID  . aspirin EC  81 mg Oral Daily  . heparin  5,000 Units Subcutaneous Q8H  . metoprolol  5 mg Intravenous Q6H  . pantoprazole (PROTONIX) IV  40 mg Intravenous Q12H   Prescriptions prior to admission  Medication Sig Dispense Refill  . aspirin 81 MG EC tablet Take 81 mg by mouth every evening.       . fenofibrate micronized (LOFIBRA) 200 MG capsule Take 200 mg by mouth daily before breakfast.        . ferrous sulfate 325 (65 FE) MG tablet Take 325 mg by mouth 2 (two) times daily.       . metoprolol (LOPRESSOR) 50 MG tablet Take 25 mg by mouth every morning.       . Multiple Vitamin (MULTIVITAMIN) tablet Take 1 tablet by mouth daily. centrum silver.      . multivitamin-lutein (OCUVITE-LUTEIN) CAPS Take 1 capsule by mouth daily. For senior      . omeprazole (PRILOSEC) 20 MG capsule Take 20 mg by mouth daily as needed. Acid reflux        Family History  Problem Relation Age of Onset  . Heart disease Maternal Grandfather   . Heart disease      maternal side  . Colon cancer Neg Hx      History   Social History  . Marital Status: Married    Spouse Name: N/A    Number of Children: 3  . Years of Education: N/A   Occupational History  . Retired    Social History Main Topics  . Smoking status: Former Smoker  -- 1.0 packs/day for 2 years    Quit date: 11/10/1964  . Smokeless tobacco: Never Used  . Alcohol Use: No  . Drug Use: No  . Sexually Active: Not on file   Other Topics Concern  . Not on file   Social History Narrative   caffeinated drinks less than one a day     Review of Systems: General: positive for weakness, negative for chills, fever, night sweats or weight changes.  Cardiovascular: negative for chest pain, dyspnea on exertion, edema, orthopnea, palpitations, paroxysmal nocturnal dyspnea or  shortness of breath Dermatological: negative for rash Respiratory: negative for cough or wheezing Urologic:  negative for hematuria Abdominal: negative for nausea, vomiting, diarrhea, bright red blood per rectum, melena, or hematemesis Neurologic: negative for visual changes, syncope, or dizziness All other systems reviewed and are otherwise negative except as noted above.  Physical Exam: Blood pressure 137/82, pulse 157, temperature 98 F (36.7 C), temperature source Oral, resp. rate 20, height 5\' 9"  (1.753 m), weight 66.663 kg (146 lb 15.4 oz), SpO2 100.00%.   General: Well developed, well nourished, in no acute distress. Head: Normocephalic, atraumatic, sclera non-icteric, no xanthomas, nares are without discharge.  Neck: Negative for carotid bruits. JVD not elevated. Lungs:  Clear bilaterally to auscultation without wheezes, rales, or rhonchi. Breathing is unlabored. Heart: RRR with S1 S2. No murmurs, rubs, or gallops appreciated. Abdomen: Soft, tender to palpation,,  Mildly distended with hypoactive bowel sounds. No hepatomegaly. No rebound/guarding. No obvious abdominal masses. Msk:  Strength and tone appears normal for age. Extremities: Trace bilateral pretibial edema. No clubbing or cyanosis.  Distal pedal pulses are 2+ and equal bilaterally. Neuro: Alert and oriented X 3. Moves all extremities spontaneously. Psych:  Responds to questions appropriately with a normal  affect.  Labs: Recent Labs  Basename 12/09/12 0515 12/08/12 1653 12/08/12 0450   WBC 6.7 -- 9.6   HGB 9.8* 11.2* --   HCT 29.1* 33.5* --   MCV 93.0 -- 91.7   PLT 143* -- 189   Lab 12/09/12 0515 12/08/12 0450 12/07/12 1520  NA 144 143 --  K 4.0 4.3 --  CL 112 109 --  CO2 24 28 --  BUN 11 9 --  CREATININE 1.26 1.31 1.07  CALCIUM 8.5 8.5 --  PROT -- -- --  BILITOT -- -- --  ALKPHOS -- -- --  ALT -- -- --  AST -- -- --  AMYLASE -- -- --  LIPASE -- -- --  GLUCOSE 79 102* --   Radiology/Studies: Dg Chest 2 View  12/01/2012  *RADIOLOGY REPORT*  Clinical Data: Preop for takedown of ileostomy  CHEST - 2 VIEW  Comparison: None.  Findings: No active infiltrate or effusion is seen.  Mediastinal contours appear normal.  The heart is within upper limits of normal.  Surgical clips are noted around the GE junction.  No skeletal abnormality is seen.  IMPRESSION: No active lung disease.   Original Report Authenticated By: Dwyane Dee, M.D.     EKG: NSR, 80 bpm, LAD, no ST/T changes  ASSESSMENT AND PLAN:   PMHx s/f PAF, h/o mild AS in the past, Crohn's disease (s/p colostomy summer 2013) and HLD who underwent reversal on 12/07/12. He became tachycardic with rates in the 150s shortly after the procedure. On EKG and telemetry review, he has maintained NSR on Lopressor IV.   1. Crohn's disease s/p colostomy reversal- recovering well post-operatively. Per primary team.   2. Tachycardia- no documentation of rhythm, however given history of PAF, suspect paroxysm of a-fib with RVR. Rate-controlled, converted and maintaining NSR on Lopressor IV. Reviewing MAR, Lopressor was given only twice on 12/08/12 given that order parameters (HR > 60, SBP > 110) were not met. Since then, he has received it q6hr as sceduled. Agree with continuing this. Monitor rhythm on telemetry today. If maintains SR, agree with transitioning to PO tomorrow. Suspect post-op status an inciting factor. Low suspicion for PE as an  exacerbator- not tachypneic, hypoxic, no chest pain.   Filed Vitals:   12/10/12 0815  BP:  137/82  Pulse: 157  Temp:   Resp:     3. PAF- as above. Continue BB. CHADSVASc = 1. He has had hematochezia in the past from Crohn's. Continue low-dose ASA. Will review CEs. Check TSH.   4. H/o mild AS- most recent echo earlier this month reveals no aortic valve abnormalities. This will continue to be monitored.    Signed, R. Hurman Horn, PA-C 12/10/2012, 9:16 AM Patient known to me, followed for history of paroxysmal atrial fib but had not had any episodes in a very long time (years). Has been on long term beta blocker. Today while ambulating he noted fast heart beat. He was not on telemetry at the time so no hard copy of rhythm to analyse and he went back into NSR quickly after IV metoprolol was given. No chest pain. Exam now is unremarkable. Agree with assessment and plan as noted by Mr. Arguello, PA-C. Tomorrow he should be switched from IV metoprolol back to oral metoprolol.

## 2012-12-10 NOTE — Progress Notes (Signed)
3 Days Post-Op  Subjective: Alert and stable. Pain under good control. Heart rate regular. No ectopy. Ambulating in halls. Voiding normally. Tolerated clear liquid diet. Had to watery brown stools with a little bit of blood.  Good urine output.  J-P drainage 145 cc per 24 hours. Serosanguineous.  Objective: Vital signs in last 24 hours: Temp:  [98 F (36.7 C)-98.6 F (37 C)] 98 F (36.7 C) (01/31 0624) Pulse Rate:  [60-74] 68  (01/31 0624) Resp:  [16-22] 20  (01/31 0631) BP: (123-144)/(56-72) 142/72 mmHg (01/31 0624) SpO2:  [94 %-100 %] 100 % (01/31 0631) Last BM Date: 12/06/12  Intake/Output from previous day: 01/30 0701 - 01/31 0700 In: -  Out: 895 [Urine:750; Drains:145] Intake/Output this shift:     Exam: General appearance: alert. Cooperative. Mental status normal. No distress. Resp: clear to auscultation bilaterally. Still with diminished breath sounds at bases but no rhonchi or wheeze. GI: soft. Not distended. Appropriately tender. Hypoactive bowel sounds. Wounds with wicks in place. Slight erythema but no infection or odor. JP drainage thin, serosanguineous.  Lab Results:  No results found for this or any previous visit (from the past 24 hour(s)).   Studies/Results: @RISRSLT24 @     . alvimopan  12 mg Oral BID  . heparin  5,000 Units Subcutaneous Q8H  . metoprolol  5 mg Intravenous Q6H  . pantoprazole (PROTONIX) IV  40 mg Intravenous Q12H     Assessment/Plan: s/p Procedure(s): ILEOSTOMY TAKEDOWN LYSIS OF ADHESION PROCTOSCOPY  POD #3. Extensive adhesio lysis, resection and closure of ileostomy with ileoproctostomy. Stable.  Advance to full  liquids  Entereg protocol  Increase ambulation.  Incentive spirometry. Watch wound.   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 No evidence of active disease found at surgery.  Also a history bladder repair, 1964,  question whether this was Crohn's fistula. Details unknown.   Coronary artery disease, stable,    restart EC-ASA 81 mg./day  Paroxysmal atrial fibrillation. Appears in sinus rhythm at this time. Continue IV Lopressor.  Followed by Cassell Clement for outpatient cardiology. Switch to PO beta blocker tomorrow(lopressor 25 mg. BID)  Aortic stenosis, mild   Remote history of peptic ulcer disease with unknown gastric surgery. Continue double dose proton pump inhibitor. Switch to PO form tomorrow.     LOS: 3 days    [facility] M. Derrell Lolling, M.D., [facility] General and Minimally invasive Surgery Breast and Colorectal Surgery Office:   [phone] Pager:   [phone]  12/10/2012  . .prob

## 2012-12-10 NOTE — Progress Notes (Signed)
At ~0815 NT went to walk in hall with pt and then pt went to the restroom.  After pt came out of restroom pt's HR was in the 150's-160's.  BP - 137/82.  No c/o of chest pain or SOB but pt states he feels palpitations.  MD notified and new orders received.  Pt's HR now in the 80's.  Will carry out and continue to monitor.  Nicholas [name]

## 2012-12-10 NOTE — Progress Notes (Signed)
UR completed 

## 2012-12-11 DIAGNOSIS — I498 Other specified cardiac arrhythmias: Secondary | ICD-10-CM

## 2012-12-11 DIAGNOSIS — I471 Supraventricular tachycardia: Secondary | ICD-10-CM

## 2012-12-11 LAB — BASIC METABOLIC PANEL
Calcium: 8.7 mg/dL (ref 8.4–10.5)
Creatinine, Ser: 1.02 mg/dL (ref 0.50–1.35)
GFR calc Af Amer: 82 mL/min — ABNORMAL LOW (ref >90)

## 2012-12-11 LAB — CBC
MCH: 30.8 pg (ref 26.0–34.0)
MCV: 92.3 fL (ref 78.0–100.0)
Platelets: 203 10*3/uL (ref 150–400)
RDW: 13.6 % (ref 11.5–15.5)

## 2012-12-11 MED ORDER — PANTOPRAZOLE SODIUM 40 MG PO TBEC
40.0000 mg | DELAYED_RELEASE_TABLET | Freq: Two times a day (BID) | ORAL | Status: DC
  Administered 2012-12-11 – 2012-12-13 (×5): 40 mg via ORAL
  Filled 2012-12-11 (×5): qty 1

## 2012-12-11 MED ORDER — FUROSEMIDE 10 MG/ML IJ SOLN
20.0000 mg | Freq: Once | INTRAMUSCULAR | Status: AC
  Administered 2012-12-11: 20 mg via INTRAVENOUS
  Filled 2012-12-11: qty 2

## 2012-12-11 MED ORDER — METOPROLOL TARTRATE 25 MG PO TABS
25.0000 mg | ORAL_TABLET | Freq: Two times a day (BID) | ORAL | Status: DC
  Administered 2012-12-11 – 2012-12-13 (×5): 25 mg via ORAL
  Filled 2012-12-11 (×6): qty 1

## 2012-12-11 NOTE — Progress Notes (Signed)
0129, 12/11/12.  Mr. Zingg was up to the bathroom with NT, during the process, Mr. Klippel HR became tachy at 120-172's.  He said he felt heart palpitation when he was up. His BP was 121/64.   Pt on tele monitor.  After he returned to bed his heart rate return to low 60's..  We will continue to monitor.  Jose Persia, RN

## 2012-12-11 NOTE — Progress Notes (Signed)
4 Days Post-Op  Subjective: Alert and stable. No specific complaints. Tolerating full liquid diet. Passing flatus and has had some loose stools. Occasionally incontinent. Still learning. JP drainage less.  Ambulating. Voiding well. Pain under control with reduced narcotic.  Had one or 2 episodes of tachycardia yesterday. Presumably paroxysmal atrial fibrillation. Appreciate Dr. Coolidge Breeze bills consultative advice. Seems to be in sinus rhythm this morning.We'll transition to by mouth beta blockers today.  Objective: Vital signs in last 24 hours: Temp:  [98.4 F (36.9 C)-98.6 F (37 C)] 98.4 F (36.9 C) (02/01 0608) Pulse Rate:  [64-157] 65  (02/01 0608) Resp:  [16-18] 16  (02/01 0608) BP: (104-137)/(55-82) 124/66 mmHg (02/01 0608) SpO2:  [96 %-100 %] 98 % (02/01 0608) Last BM Date: 12/11/12 (normal bowel movement per pt.)  Intake/Output from previous day: 01/31 0701 - 02/01 0700 In: 2066.2 [P.O.:720; I.V.:1346.2] Out: 21 [Urine:1; Drains:20] Intake/Output this shift: Total I/O In: 694.2 [I.V.:694.2] Out: -   General appearance: alert and stable. Mental status normal. In no distress. Pleasant. CV: HR 65, pulse full, regular, no ectopy Abdomen: Soft. Trace soft tissue edema throughout the torso consistent with fluid retention, wounds look okay. Slightly pink. . No odor. No purulence.   Does not look infected. JP drainage serosanguineous and odorless. Bowel sounds present.  Lab Results:  Results for orders placed during the hospital encounter of 12/07/12 (from the past 24 hour(s))  TSH     Status: Abnormal   Collection Time   12/10/12 11:40 AM      Component Value Range   TSH 4.641 (*) 0.350 - 4.500 uIU/mL  CBC     Status: Abnormal   Collection Time   12/11/12  5:00 AM      Component Value Range   WBC 5.5  4.0 - 10.5 K/uL   RBC 3.38 (*) 4.22 - 5.81 MIL/uL   Hemoglobin 10.4 (*) 13.0 - 17.0 g/dL   HCT 16.1 (*) 09.6 - 04.5 %   MCV 92.3  78.0 - 100.0 fL   MCH 30.8  26.0 - 34.0 pg    MCHC 33.3  30.0 - 36.0 g/dL   RDW 40.9  81.1 - 91.4 %   Platelets 203  150 - 400 K/uL  BASIC METABOLIC PANEL     Status: Abnormal   Collection Time   12/11/12  5:00 AM      Component Value Range   Sodium 136  135 - 145 mEq/L   Potassium 3.7  3.5 - 5.1 mEq/L   Chloride 104  96 - 112 mEq/L   CO2 27  19 - 32 mEq/L   Glucose, Bld 100 (*) 70 - 99 mg/dL   BUN 7  6 - 23 mg/dL   Creatinine, Ser 7.82  0.50 - 1.35 mg/dL   Calcium 8.7  8.4 - 95.6 mg/dL   GFR calc non Af Amer 71 (*) >90 mL/min   GFR calc Af Amer 82 (*) >90 mL/min     Studies/Results: @RISRSLT24 @     . alvimopan  12 mg Oral BID  . aspirin EC  81 mg Oral Daily  . furosemide  20 mg Intravenous Once  . heparin  5,000 Units Subcutaneous Q8H  . metoprolol tartrate  25 mg Oral BID  . pantoprazole  40 mg Oral BID     Assessment/Plan: s/p Procedure(s): ILEOSTOMY TAKEDOWN LYSIS OF ADHESION PROCTOSCOPY  POD #4. Extensive adhesio lysis, resection and closure of ileostomy with ileoproctostomy. Stable.  Advance to low residue diet Entereg  protocol  Increase ambulation.  Incentive spirometry.  Watch wound. Wicks out tomorrow  Increased soft-tissue edema, suspect fluid retention, will give single dose IV Lasix today.  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 No evidence of active disease found at surgery.  Also a history bladder repair, 1964, question whether this was Crohn's fistula. Details unknown.   Coronary artery disease, stable, restart EC-ASA 81 mg./day   Paroxysmal atrial fibrillation. Appears in sinus rhythm at this time.  Presumably had one or 2 episodes of PAF yesterday. Will convert to beta blocker po, , Lopressor 25 mg twice a day. Continue to monitor.  Aortic stenosis, mild   Remote history of peptic ulcer disease with unknown gastric surgery. Continue double dose proton pump inhibitor. Switch to PO form  .     LOS: 4 days    Lajeana Strough M. Derrell Lolling, M.D., [facility] General and Minimally invasive Surgery Breast and Colorectal Surgery Office:   [phone] Pager:   [phone]  12/11/2012  . .prob

## 2012-12-11 NOTE — Progress Notes (Signed)
SUBJECTIVE: The patient is doing well today.  At this time, he denies chest pain, shortness of breath, or any new concerns.     [name] alvimopan  12 mg Oral BID  . aspirin EC  81 mg Oral Daily  . heparin  5,000 Units Subcutaneous Q8H  . metoprolol tartrate  25 mg Oral BID  . pantoprazole  40 mg Oral BID      . 0.9 % NaCl with KCl 20 mEq / L 50 mL/hr at 12/10/12 1853    OBJECTIVE: Physical Exam: Filed Vitals:   12/10/12 1422 12/10/12 2224 12/11/12 0150 12/11/12 0608  BP: 124/63 104/55 121/64 124/66  Pulse: 69 64 64 65  Temp: 98.4 F (36.9 C) 98.6 F (37 C) 98.4 F (36.9 C) 98.4 F (36.9 C)  TempSrc: Oral Oral Oral Oral  Resp: 18 16 17 16   Height:      Weight:      SpO2: 100% 97% 96% 98%    Intake/Output Summary (Last 24 hours) at 12/11/12 4540 Last data filed at 12/11/12 0600  Gross per 24 hour  Intake 2066.17 ml  Output    621 ml  Net 1445.17 ml    Telemetry reveals sinus rhythm, with atrial tachycardia (vs atrial flutter) x 15 minutes overnight with V rates 150s,  HR now in sinus are 60s-70s  GEN- The patient is well appearing, alert and oriented x 3 today.   Head- normocephalic, atraumatic Eyes-  Sclera clear, conjunctiva pink Ears- hearing intact Oropharynx- clear Neck- supple, no JVP Lymph- no cervical lymphadenopathy Lungs- Clear to ausculation bilaterally, normal work of breathing Heart- Regular rate and rhythm, no murmurs, rubs or gallops, PMI not laterally displaced GI- soft, NT, ND, hypoactive BS Extremities- no clubbing, cyanosis, or edema Skin- no rash or lesion Psych- euthymic mood, full affect Neuro- strength and sensation are intact  LABS: Basic Metabolic Panel:  Basename 12/11/12 0500 12/09/12 0515  NA 136 144  K 3.7 4.0  CL 104 112  CO2 27 24  GLUCOSE 100* 79  BUN 7 11  CREATININE 1.02 1.26  CALCIUM 8.7 8.5  MG -- --  PHOS -- --   Liver Function Tests: No results found for this basename:  AST:2,ALT:2,ALKPHOS:2,BILITOT:2,PROT:2,ALBUMIN:2 in the last 72 hours No results found for this basename: LIPASE:2,AMYLASE:2 in the last 72 hours CBC:  Basename 12/11/12 0500 12/09/12 0515  WBC 5.5 6.7  NEUTROABS -- --  HGB 10.4* 9.8*  HCT 31.2* 29.1*  MCV 92.3 93.0  PLT 203 143*   Cardiac Enzymes: No results found for this basename: CKTOTAL:3,CKMB:3,CKMBINDEX:3,TROPONINI:3 in the last 72 hours BNP: No components found with this basename: POCBNP:3 D-Dimer: No results found for this basename: DDIMER:2 in the last 72 hours Hemoglobin A1C: No results found for this basename: HGBA1C in the last 72 hours Fasting Lipid Panel: No results found for this basename: CHOL,HDL,LDLCALC,TRIG,CHOLHDL,LDLDIRECT in the last 72 hours Thyroid Function Tests:  Basename 12/10/12 1140  TSH 4.641*  T4TOTAL --  T3FREE --  THYROIDAB --   Anemia Panel: No results found for this basename: VITAMINB12,FOLATE,FERRITIN,TIBC,IRON,RETICCTPCT in the last 72 hours  RADIOLOGY: Dg Chest 2 View  12/01/2012  *RADIOLOGY REPORT*  Clinical Data: Preop for takedown of ileostomy  CHEST - 2 VIEW  Comparison: None.  Findings: No active infiltrate or effusion is seen.  Mediastinal contours appear normal.  The heart is within upper limits of normal.  Surgical clips are noted around the GE junction.  No skeletal abnormality is seen.  IMPRESSION: No  active lung disease.   Original Report Authenticated By: Dwyane Dee, M.D.     ASSESSMENT AND PLAN:  Active Problems:  Crohn's disease  Ileostomy in place  1. Tachycardia H/o afib Overnight documented to have a regular tachycardia on telemetry (either Atach vs atrial flutter) Now back in sinus rhythm I would anticipate that his arrhythmia will subside back on oral metoprolol as he recovers from surgery. If episodes become more of an issue, we may have to consider different options.  I would favor a more conservative approach for now.  Will see as needed over the  weekend  Hillis Range, MD 12/11/2012 8:22 AM

## 2012-12-12 NOTE — Progress Notes (Signed)
5 Days Post-Op  Subjective: Tolerating low residue diet fairly well. No nausea or vomiting. Multiple loose stools. No new problems.  JP drainage markedly diminished, serosanguineous. His heart rate 85, regular, no ectopy. No episodes of palpitations or tachycardia noted. Back on oral beta blockers.  Objective: Vital signs in last 24 hours: Temp:  [98.2 F (36.8 C)-98.6 F (37 C)] 98.2 F (36.8 C) (02/01 2132) Pulse Rate:  [66-85] 85  (02/01 2132) Resp:  [16-18] 18  (02/01 2132) BP: (110-127)/(64-78) 127/78 mmHg (02/01 2132) SpO2:  [95 %-98 %] 98 % (02/01 2132) Last BM Date: 12/11/12  Intake/Output from previous day: 02/01 0701 - 02/02 0700 In: 1210 [P.O.:980; I.V.:230] Out: 30 [Drains:30] Intake/Output this shift:    General appearance: alert. Pleasant. In no distress. Mental status normal. GI: abdomen soft. Active bowel sounds. Wound clean. Telfa wicks removed. JP drainage serous. Perhaps borderline distended. No unusual tenderness.  Lab Results:  No results found for this or any previous visit (from the past 24 hour(s)).   Studies/Results: @RISRSLT24 @     . aspirin EC  81 mg Oral Daily  . heparin  5,000 Units Subcutaneous Q8H  . metoprolol tartrate  25 mg Oral BID  . pantoprazole  40 mg Oral BID     Assessment/Plan: s/p Procedure(s): ILEOSTOMY TAKEDOWN LYSIS OF ADHESION PROCTOSCOPY  POD #5. Extensive adhesio lysis, resection and closure of ileostomy with ileoproctostomy. Stable.  Advance to Regular diet. Hold off on antidiarrheals for today. Wicks out Today. Wound seems okay. Home tomorrow if no further problems.    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 No evidence of active disease found at surgery.  Also a history bladder repair, 1964, question whether this was Crohn's fistula. Details unknown.   Coronary artery disease, stable, on EC-ASA 81 mg./day    Paroxysmal atrial fibrillation. Also occasional regular tachycardia as noted by Dr. Johney Frame.  Appears in sinus rhythm at this time,, And doesn't appear to have had any further episodes last 24 hours. Remains on telemetry..  On beta blocker po, , Lopressor 25 mg twice a day. Continue to monitor.   Aortic stenosis, mild   Remote history of peptic ulcer disease with unknown gastric surgery. Continue double dose proton pump inhibitor. Switch to PO form .      LOS: 5 days    Twania Bujak M. Derrell Lolling, M.D., [facility] General and Minimally invasive Surgery Breast and Colorectal Surgery Office:   [phone] Pager:   [phone]  12/12/2012  . .prob

## 2012-12-13 DIAGNOSIS — Z932 Ileostomy status: Secondary | ICD-10-CM

## 2012-12-13 MED ORDER — OXYCODONE-ACETAMINOPHEN 7.5-325 MG PO TABS: 1.0000 | ORAL_TABLET | ORAL | Status: DC | PRN

## 2012-12-13 NOTE — Discharge Summary (Signed)
Patient ID: BERNARDO BRAYMAN [id number] 72 y.o. 1941-01-20  12/07/2012  Discharge date and time: December 13, 2012  Admitting Physician: Ernestene Mention  Discharge Physician: Ernestene Mention  Admission Diagnoses: crohns disease with ileostomy  Discharge Diagnoses:1.  Crohn's disease, with past history complex ileal resection and subtotal colectomy for complications, obstruction, and fistula formation. 2. Ileostomy in place, and desires reversal 3. Coronary artery disease, stable, on daily aspirin, followed by Dr. Patty Sermons 4. Paroxysmal atrial fibrillation. Stable on beta blockers 5. Aortic stenosis, mild 6. Remote history of peptic ulcer disease with unknown gastric surgery. On chronic proton pump inhibitors.   Operations: Procedure(s): ILEOSTOMY TAKEDOWN Ilia proctostomy LYSIS OF ADHESION PROCTOSCOPY  Admission Condition: good  Discharged Condition: good  Indication for Admission: AWAIS COBARRUBIAS is a 72 y.o. male. This patient has complicated Crohn's disease. He was hospitalized on 04/09/2012 with an inflammatory mass in the right lower quadrant, a chronic ileosigmoid fistula, and chronic pan colonic fecal impaction. Basically the rest of the colon was defunctionalized and had rockhard stool from the ileocecal valve to the mid sigmoid. He underwent hyperalimentation preop. On 04/16/2012 he underwent subtotal colectomy, resection of ileal mass, Brooke ileostomy, and stapling of the rectum above the peritoneal reflection. He had lost a lot of weight. As an an outpatient he slowly improved and gained weight back to normal. 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 The defunctionalized distal segment appeared to be 16-18 cm in length. He continues to followup with his gastroenterologist, Dr. Raeanne Barry at [facility] and saw him about 2 weeks ago. He will followup with him in April, assuming that we  do the ileostomy closure. He is brought to the hospital electively for surgery to reverse his ileostomy   Hospital Course: On the day of admission the patient was taken to the operating room and underwent exploratory laparotomy. Extensive lysis of adhesions was required. The ileostomy was taken down. We resected about 4 inches of  terminal ileum and performed an anastomosis between the ileum and the mid rectum with a stapling device. Intraoperative proctoscopy showed that the anastomosis was at 13-14 cm above the dentate line. There was no leak. There was no evidence of active inflammatory bowel disease. There appeared to be plenty of small bowel remaining. Postoperatively the patient did well. He had a pelvic drain for several days that drained serosanguineous fluid. This was removed after the drainage had subsided and the patient had resumed diet and was having bowel movements. He progressed in his diet and activities without much difficulty. He began having stools at the time of discharge he was having several liquid, somewhat mushy stools and tolerating a regular diet. No incontinence.At the time of discharge his wound looked good and there was no sign of infection. He was given instructions in diet and activities. He was told to continue his Lopressor, aspirin, multivitamins, iron, Prilosec, and was given a prescription for Percocet. He was asked to return to see me in the office in one week for staple removal.  Consults: None  Significant Diagnostic Studies: Pathology  Treatments: surgery: Laparotomy, lysis of adhesions, takedown of ileostomy, ileoproctostomy.  Disposition: Home  Patient Instructions:   [place], [place]  Home Medication Instructions [id number]   Printed on:12/13/12 0602  Medication Information                    aspirin 81 MG EC tablet Take 81 mg by mouth every  evening.            fenofibrate micronized (LOFIBRA) 200 MG capsule Take 200 mg by mouth daily before  breakfast.             ferrous sulfate 325 (65 FE) MG tablet Take 325 mg by mouth 2 (two) times daily.            metoprolol (LOPRESSOR) 50 MG tablet Take 25 mg by mouth every morning.            multivitamin-lutein (OCUVITE-LUTEIN) CAPS Take 1 capsule by mouth daily. For senior           Multiple Vitamin (MULTIVITAMIN) tablet Take 1 tablet by mouth daily. centrum silver.           omeprazole (PRILOSEC) 20 MG capsule Take 20 mg by mouth daily as needed. Acid reflux           oxyCODONE-acetaminophen (PERCOCET) 7.5-325 MG per tablet Take 1 tablet by mouth every 4 (four) hours as needed for pain.             Activity: no sports or lifting more than 15 pounds for 5 weeks. Walk a lot Diet: regular diet Wound Care: as directed  Follow-up:  With Dr. Derrell Lolling in 1 week.  Signed: Angelia Mould. Derrell Lolling, M.D., FACS General and minimally invasive surgery Breast and Colorectal Surgery  12/13/2012, 6:02 AM

## 2012-12-13 NOTE — Progress Notes (Signed)
Patient discharged to home with wife.  Discharge teaching completed including follow up care, medications, wound care, diet and activity.  Verbalizes understanding with no further questions.  Discharged by wheelchair with wife.

## 2012-12-13 NOTE — Progress Notes (Signed)
6 Days Post-Op  Subjective: Doing well. Tolerating diet. Stools slowing down, getting a little bit mushy. Good control. No incontinence. Tolerating diet. No nausea or vomiting. Minimal pain. Ready to go home.  Objective: Vital signs in last 24 hours: Temp:  [98.1 F (36.7 C)-99.5 F (37.5 C)] 98.7 F (37.1 C) (02/02 2126) Pulse Rate:  [66-71] 68  (02/02 2126) Resp:  [16-18] 16  (02/02 2126) BP: (111-118)/(59-74) 117/59 mmHg (02/02 2126) SpO2:  [97 %-98 %] 98 % (02/02 2126) Last BM Date: 12/11/12  Intake/Output from previous day:   Intake/Output this shift:    General appearance: alert. Mental status normal. No distress. Appears comfortable GI: abdomen soft. Wounds looked good. Staples in place. No sign of infection. Soft.  Lab Results:  No results found for this or any previous visit (from the past 24 hour(s)).   Studies/Results: @RISRSLT24 @     . aspirin EC  81 mg Oral Daily  . heparin  5,000 Units Subcutaneous Q8H  . metoprolol tartrate  25 mg Oral BID  . pantoprazole  40 mg Oral BID     Assessment/Plan: s/p Procedure(s): ILEOSTOMY TAKEDOWN LYSIS OF ADHESION PROCTOSCOPY  POD #6. Extensive adhesiolysis, resection and closure of ileostomy with ileoproctostomy. Stable.  Discharge home today. Diet and activities discussed Continue multivitamins, iron, Prilosec, Lopressor, Prescription for Percocet given See me in office in one week for staple removal   Crohn's disease, history complex ileal resection and subtotal colectomy for complications, obstruction and fistula formation 04/09/2012. No evidence of active disease found at surgery.  Also a history bladder repair, 1964, question whether this was Crohn's fistula. Details unknown.   Coronary artery disease, stable, on EC-ASA 81 mg./day   Paroxysmal atrial fibrillation. Also occasional regular tachycardia as noted by Dr. Johney Frame. Appears in sinus rhythm at this time,, And doesn't appear to have had any further  episodes last 48 hours. Remains on telemetry.. On beta blocker po, , Lopressor 25 mg twice a day   Aortic stenosis, mild   Remote history of peptic ulcer disease with unknown gastric surgery. Continue double dose proton pump inhibitor. Switch to PO form .     LOS: 6 days    Shyne Lehrke M. Derrell Lolling, M.D., [facility] General and Minimally invasive Surgery Breast and Colorectal Surgery Office:   [phone] Pager:   [phone]  12/13/2012  . .prob

## 2012-12-20 ENCOUNTER — Ambulatory Visit (INDEPENDENT_AMBULATORY_CARE_PROVIDER_SITE_OTHER): Payer: Medicare Other

## 2012-12-20 ENCOUNTER — Encounter (INDEPENDENT_AMBULATORY_CARE_PROVIDER_SITE_OTHER): Payer: Self-pay

## 2012-12-20 VITALS — BP 116/74 | Temp 98.0°F | Resp 16 | Ht 69.0 in | Wt 146.2 lb

## 2012-12-20 DIAGNOSIS — Z4802 Encounter for removal of sutures: Secondary | ICD-10-CM

## 2012-12-20 NOTE — Progress Notes (Signed)
Patient comes to office today for staple removal.  Patient reports doing well.  Removed staples from abdominal incision and ostomy site.  Both incisions (abd & ostomy removal site) intact.  Steri-strips applied.  Patient tolerated well.  Patient has follow up appointment on 12/30/12 @ 8:45 am w/Dr. Derrell Lolling.

## 2012-12-25 ENCOUNTER — Telehealth: Payer: Self-pay | Admitting: Physician Assistant

## 2012-12-25 NOTE — Telephone Encounter (Signed)
Patient called regarding recent palpitations. He has history of PAF, but was in NSR when last seen in clinic on December 31. He had recent intestinal surgery and reportedly had an elevated HR 180, while ambulating. However, no medication adjustments were made. He states he has done well since then, although he thought his heart was trying to "act up" yesterday. Nevertheless, he suggests that his pulse has remained quite stable in the 60 bpm range. He is otherwise without complaint. We reviewed his prior medication regimen at time of last OV, which indicated Lopressor 25 twice a day. However, he currently is only taking it once a day. Therefore, I advised that he resume the prior dose of 25 twice a day, and to contact our office for any worsening of symptoms.

## 2012-12-27 ENCOUNTER — Telehealth: Payer: Self-pay | Admitting: Cardiology

## 2012-12-27 NOTE — Telephone Encounter (Signed)
I spoke with the patient. He states that about 3 weeks ago he had a reverse ostomy. When he was in the hospital, he had some episodes of a-fib with rates in the 170's. He has done well until the last 3 days when he has had more intermittent episodes of a-fib. He spoke with Rozell Searing, PA on Saturday and was instructed to increase his lopressor from 25 mg once daily to 25 mg twice daily. He feels ok with this change. He only reports some weakness after his a-fib starts. He reports these episodes as lasting only minutes. He is currently not on a blood thinner, but does take aspirin 81 mg once daily. He is not due to see Dr. Patty Sermons until May. I have advised I will forward this message to Dr. Patty Sermons to review when he returns on Wednesday. I have gone ahead and scheduled him to see Dr. Patty Sermons on Friday. If Dr. Patty Sermons wishes to do something in the interim, we will call the patient back. He is agreeable with this plan. I will forward the message to Dr. Patty Sermons and Juliette Alcide for review.

## 2012-12-27 NOTE — Telephone Encounter (Signed)
New Problem   Pt states he keeps going into afib. York Spaniel it comes and goes, would like to speak to nurse.

## 2012-12-28 NOTE — Telephone Encounter (Signed)
Yes continue ASA 81 and see Friday

## 2012-12-30 ENCOUNTER — Ambulatory Visit (INDEPENDENT_AMBULATORY_CARE_PROVIDER_SITE_OTHER): Payer: Medicare Other | Admitting: General Surgery

## 2012-12-30 ENCOUNTER — Encounter (INDEPENDENT_AMBULATORY_CARE_PROVIDER_SITE_OTHER): Payer: Self-pay | Admitting: General Surgery

## 2012-12-30 VITALS — BP 124/76 | HR 74 | Temp 97.8°F | Resp 18 | Ht 69.0 in | Wt 143.4 lb

## 2012-12-30 DIAGNOSIS — K5090 Crohn's disease, unspecified, without complications: Secondary | ICD-10-CM

## 2012-12-30 NOTE — Progress Notes (Signed)
Patient ID: Nicholas [name], male   DOB: 09-21-1941, 72 y.o.   MRN: [id number] History: This patient returns for a postop check following exploratory laparotomy, lysis of adhesions, takedown of ileostomy and ileal proctostomy on 11/29/2012.  .  This patient has complicated Crohn's disease. He was initially hospitalized on 04/09/2012 with an inflammatory mass in the right lower quadrant, a chronic ileosigmoid fistula, and chronic pan colonic fecal impaction. Basically the rest of the colon was defunctionalized and had rockhard stool from the ileocecal valve to the mid sigmoid. He underwent hyperalimentation preop. On 04/16/2012 he underwent subtotal colectomy, resection of ileal mass, Brooke ileostomy, and stapling of the rectum above the peritoneal reflection. He had lost a lot of weight. As an an outpatient he slowly improved and gained weight back to normal.  He continues to followup with his gastroenterologist, Dr. Raeanne Barry at [facility] He will followup with him in April. . . He is being followed closely by Dr. Jarome Matin for his internal medical problems. He remains on Lopressor because of paroxysmal atrial fibrillation.  During his recent hospitalization he did fairly well, he did have some recurrent problems with atrial fibrillation and was [facility] by [facility]. He is scheduled to see Dr. Patty Sermons in the office tomorrow. Surgically he is doing well. His appetite is good. His weight has been stable. He has about 6 loose stools per day and is started taking a little bit of Imodium. He has a little but of incisional tenderness but nothing bad. Basically doing okay  Exam: Patient looks well. His wife is with him. Color good. Weight 143.6 pounds. Heart rate 74 and regular Abdomen soft. Midline wound and right lower quadrant ileostomy site all healing well. No signs of infection or hernia. Minimal tenderness.  Assessment: Status post laparotomy, lysis of adhesions,  takedown of ileostomy and anastomosis to rectum at about 13-15 cm. Doing well 3 weeks postop Complicated Crohn's disease, as described above  Plan  encouraged and reviewed diet and activities. Continue vitamins He will need a CBC checked periodically with his primary care physician or gastroenterologist. He is aware that he is at risk for B12 deficiency and  anemia No sports or heavy lifting for another 2-3 weeks, but take a walk daily Keep appt.  with Dr. Cassell Clement at [facility]  cardiology tomorrow Keep appt,. with Dr. Tyler Aas [facility] on April 15 Return to see me in 8 weeks.   Angelia Mould. Derrell Lolling, M.D., [facility] General and Minimally invasive Surgery Breast and Colorectal Surgery Office:   [phone] Pager:   [phone]

## 2012-12-30 NOTE — Patient Instructions (Signed)
You seem to be doing very well following your ileostomy closure. Your wounds are healing well and I do not think there has been any complications.  I encourage a daily 30 minute walk daily  Be sure to drink extra fluids and water to avoid dehydration  You may take Imodium 2-4 times a day to control the loose stools  Return to see me in 2 months  Keep her appointments with Dr. Patty Sermons tomorrow and Dr. Chesley Mires at [facility] in April.

## 2012-12-31 ENCOUNTER — Ambulatory Visit (INDEPENDENT_AMBULATORY_CARE_PROVIDER_SITE_OTHER): Payer: Medicare Other | Admitting: Cardiology

## 2012-12-31 ENCOUNTER — Encounter: Payer: Self-pay | Admitting: Cardiology

## 2012-12-31 VITALS — BP 119/58 | HR 50 | Ht 69.0 in | Wt 146.0 lb

## 2012-12-31 DIAGNOSIS — I350 Nonrheumatic aortic (valve) stenosis: Secondary | ICD-10-CM

## 2012-12-31 DIAGNOSIS — I359 Nonrheumatic aortic valve disorder, unspecified: Secondary | ICD-10-CM

## 2012-12-31 DIAGNOSIS — I480 Paroxysmal atrial fibrillation: Secondary | ICD-10-CM

## 2012-12-31 DIAGNOSIS — K5090 Crohn's disease, unspecified, without complications: Secondary | ICD-10-CM

## 2012-12-31 DIAGNOSIS — I4891 Unspecified atrial fibrillation: Secondary | ICD-10-CM

## 2012-12-31 NOTE — Patient Instructions (Addendum)
Your physician recommends that you continue on your current medications as directed. Please refer to the Current Medication list given to you today.  Your physician wants you to follow-up in: 6 month ov/ekg You will receive a reminder letter in the mail two months in advance. If you don't receive a letter, please call our office to schedule the follow-up appointment.  

## 2012-12-31 NOTE — Assessment & Plan Note (Signed)
The patient is tolerating his present dose of metoprolol 25 mg twice a day with no side effects.  Continue current therapy

## 2012-12-31 NOTE — Assessment & Plan Note (Signed)
The patient is making a good functional recovery from his most recent surgery for his Crohn's disease.  His appetite is good and his weight is up 1 pound since last visit.

## 2012-12-31 NOTE — Progress Notes (Signed)
Annie Main Date of Birth:  1940-12-03 [facility] [address] [place], [place]  [address] [phone]  Fax   [phone]  HPI: This pleasant 72 year old gentleman is seen back for a post hospital visit.  He has a past history of mild aortic stenosis.  He has a past history of paroxysmal atrial fibrillation.  He has been on long-term antiarrhythmic therapy with  metoprolol.  The patient was recently hospitalized at [facility] for [facility].  Preoperatively he had an update of his echocardiogram on 11/12/12 which showed an ejection fraction of 60-65% and his aortic valve appeared to be normal.  On 11/17/12 the patient underwent a treadmill Myoview stress test which showed an ejection fraction of 66% and no ischemia.  While he was at [facility] recovering from surgery he did have an episode of brief rapid irregular heart rhythm at a heart rate in the 170s.  He went back into normal rhythm spontaneously before a 12-lead EKG could be obtained.  However his rhythm was noted on the telemetry monitor.  Since going home from the hospital the patient had an additional episode of palpitations last weekend and spoke to the on-call cardiologist who advised him to increase his metoprolol from 25 mg daily up to 25 mg twice a day.  He did that and has had no further arrhythmia.   Current Outpatient Prescriptions  Medication Sig Dispense Refill  . aspirin 81 MG EC tablet Take 81 mg by mouth every evening.       . fenofibrate micronized (LOFIBRA) 200 MG capsule Take 200 mg by mouth daily before breakfast.        . ferrous sulfate 325 (65 FE) MG tablet Take 325 mg by mouth 2 (two) times daily.       . metoprolol (LOPRESSOR) 50 MG tablet Take 25 mg by mouth 2 (two) times daily.       . Multiple Vitamin (MULTIVITAMIN) tablet Take 1 tablet by mouth daily. centrum silver.      . multivitamin-lutein (OCUVITE-LUTEIN) CAPS Take 1 capsule by mouth daily. For senior      . omeprazole (PRILOSEC)  20 MG capsule Take 20 mg by mouth daily as needed. Acid reflux      . oxyCODONE-acetaminophen (PERCOCET) 7.5-325 MG per tablet Take 1 tablet by mouth every 4 (four) hours as needed for pain.  30 tablet  0   No current facility-administered medications for this visit.    No Known Allergies  Patient Active Problem List  Diagnosis  . Aortic stenosis, mild  . Paroxysmal atrial fibrillation  . Crohn's disease  . Nonspecific (abnormal) findings on radiological and other examination of gastrointestinal tract  . Abdominal pain, right lower quadrant  . Ileostomy in place  . Atrial tachycardia    History  Smoking status  . Former Smoker -- 1.00 packs/day for 2 years  . Quit date: 11/10/1964  Smokeless tobacco  . Never Used    History  Alcohol Use No    Family History  Problem Relation Age of Onset  . Heart disease Maternal Grandfather   . Heart disease      maternal side  . Colon cancer Neg Hx     Review of Systems: The patient denies any heat or cold intolerance.  No weight gain or weight loss.  The patient denies headaches or blurry vision.  There is no cough or sputum production.  The patient denies dizziness.  There is no hematuria or hematochezia.  The  patient denies any muscle aches or arthritis.  The patient denies any rash.  The patient denies frequent falling or instability.  There is no history of depression or anxiety.  All other systems were reviewed and are negative.   Physical Exam: Filed Vitals:   12/31/12 1556  BP: 119/58  Pulse: 50   the general appearance reveals a well-developed well-nourished gentleman in no distress.The head and neck exam reveals pupils equal and reactive.  Extraocular movements are full.  There is no scleral icterus.  The mouth and pharynx are normal.  The neck is supple.  The carotids reveal no bruits.  The jugular venous pressure is normal.  The  thyroid is not enlarged.  There is no lymphadenopathy.  The chest is clear to percussion and  auscultation.  There are no rales or rhonchi.  Expansion of the chest is symmetrical.  The precordium is quiet.  The first heart sound is normal.  The second heart sound is physiologically split.  There is no murmur gallop rub or click.  There is no abnormal lift or heave.  .  Extremities reveal good pedal pulses.  There is no phlebitis or edema.  There is no cyanosis or clubbing.  Strength is normal and symmetrical in all extremities.  There is no lateralizing weakness.  There are no sensory deficits.  The skin is warm and dry.  There is no rash.      Assessment / Plan: Continue same medication.  Recheck in 6 months for office visit and EKG.

## 2013-01-11 ENCOUNTER — Encounter (INDEPENDENT_AMBULATORY_CARE_PROVIDER_SITE_OTHER): Payer: Medicare Other | Admitting: General Surgery

## 2013-01-13 ENCOUNTER — Ambulatory Visit (INDEPENDENT_AMBULATORY_CARE_PROVIDER_SITE_OTHER): Payer: Medicare Other | Admitting: General Surgery

## 2013-01-13 ENCOUNTER — Encounter (INDEPENDENT_AMBULATORY_CARE_PROVIDER_SITE_OTHER): Payer: Self-pay | Admitting: General Surgery

## 2013-01-13 VITALS — BP 126/68 | HR 60 | Temp 98.0°F | Resp 18 | Ht 69.0 in | Wt 146.0 lb

## 2013-01-13 DIAGNOSIS — K5090 Crohn's disease, unspecified, without complications: Secondary | ICD-10-CM

## 2013-01-13 NOTE — Progress Notes (Signed)
Patient ID: Nicholas [name], male   DOB: 1941/07/01, 72 y.o.   MRN: [id number] History: This patient returns because he has a suture sticking out of his skin. He is otherwise feeling well and has no complaints about his postop stay. Please see my last note about his history of complicated Crohn's disease. He is currently recovering from resection and closure of his ileostomy and anastomosis of his terminal ileum to his rectosigmoid colon.  Exam: Patient looks well. Weight 146. Abdomen soft. Nontender. PDS suture sticking out of mid wound. I lifted this up and got the entire suture and all the knots and out. Redressed the wound. No sign of infection  Assessment: Early suture granuloma midline wound, suture removed today. Anticipate spontaneous healing. Status post laparotomy, lysis of adhesions, takedown of ileostomy anastomosis to rectum at about 13-15 cm. Recovering uneventfully Complicated Crohn's disease, status post complicated resection right lower quadrant mass and subtotal colectomy  Plan: Would care discussed He has appointment with Dr. Chesley Mires, his gastroenterologist at [facility] - April 15 Return to see me on April 17 for postop check.   Angelia Mould. Derrell Lolling, M.D., [facility] General and Minimally invasive Surgery Breast and Colorectal Surgery Office:   [phone] Pager:   [phone]

## 2013-01-13 NOTE — Patient Instructions (Signed)
We removed the suture that was sticking out of your wound today. It should heal in 2 or 3 days without any problems.  You may shower.  Keep a clean Band-Aid on the area for 3 or 4 days.  Keep all of your appointments, including your appointment with Dr. Derrell Lolling on April 17.

## 2013-01-18 ENCOUNTER — Telehealth (INDEPENDENT_AMBULATORY_CARE_PROVIDER_SITE_OTHER): Payer: Self-pay | Admitting: General Surgery

## 2013-01-18 NOTE — Telephone Encounter (Signed)
5 weeks s/p ileostomy takedown.  Has had 2 days of incisional pain which he describes as sharp and a bulge as well to the left of the incision.  He says that he has had good BM's and no distension.  He denies any fevers or chills or nausea or vomiting or redness in the area.  He feels okay other than the pain at his incision.  I explained that this could be a hernia or other incisional pain since he is still pretty fresh from surgery and has been working in the yard and shoveling snow.  I offered to see him in the ER or he can be seen in the office tomorrow if he thinks that this can wait as well.

## 2013-01-19 ENCOUNTER — Telehealth (INDEPENDENT_AMBULATORY_CARE_PROVIDER_SITE_OTHER): Payer: Self-pay | Admitting: *Deleted

## 2013-01-19 NOTE — Telephone Encounter (Signed)
Patient called to state some improvement since he spoke with Dr. Biagio Quint last night.  Patient instructed to REST, use ice to help with any swelling or discomfort and also use NSAIDs.  Patient denies any signs of infection.  Patient agreeable and states understanding at this time.

## 2013-02-05 ENCOUNTER — Telehealth (INDEPENDENT_AMBULATORY_CARE_PROVIDER_SITE_OTHER): Payer: Self-pay | Admitting: Surgery

## 2013-02-05 NOTE — Telephone Encounter (Signed)
Pre op Diagnosis: Crohn's disease, status post ileo-colic resection with ileostomy  Post op Diagnosis: Same  Procedure: Resection and closure of ileostomy, ileoproctostomy, extensive lysis of adhesions requiring 75 minutes 12/07/2012 Surgeon: Angelia Mould. Derrell Lolling, M.D., FACS   Pt called noted Saturday afternoon with some redness at the ostomy incision over the past week.  A little worse today.  Incision closed except for a 1 inch area of skin that will slough off in the shower & then dry up.  No fever.  Eating well.  No chills.  Energy & appetite improving.  No pain.  No increased swelling.  He wondered if he needed an antibiotic.   I recommended heat & antiinflammatories round the clock.  Band Aid over the sloughing off area.  Come to the office on Monday to be seen.  If markedly worse over the weekend, come to the ED to be evaluated.  No antibiotics without evaluation.  He expressed understanding

## 2013-02-07 ENCOUNTER — Telehealth (INDEPENDENT_AMBULATORY_CARE_PROVIDER_SITE_OTHER): Payer: Self-pay | Admitting: General Surgery

## 2013-02-07 ENCOUNTER — Encounter (INDEPENDENT_AMBULATORY_CARE_PROVIDER_SITE_OTHER): Payer: Self-pay | Admitting: General Surgery

## 2013-02-07 ENCOUNTER — Ambulatory Visit (INDEPENDENT_AMBULATORY_CARE_PROVIDER_SITE_OTHER): Payer: Medicare Other | Admitting: General Surgery

## 2013-02-07 VITALS — BP 110/68 | HR 60 | Temp 97.2°F | Resp 14 | Ht 69.0 in | Wt 146.0 lb

## 2013-02-07 DIAGNOSIS — K5090 Crohn's disease, unspecified, without complications: Secondary | ICD-10-CM

## 2013-02-07 NOTE — Patient Instructions (Signed)
You have a localized wound infection, which I think is due to some of the sutures.  This is also known as a stitch abscess.    I removed 2 large sutures today and opened up the infection and this should help a lot.  Tomorrow morning, remove the bandage and the packing and do not repack.  Begin taking a shower twice a day starting tomorrow morning. The wound will drain some so cover  with dry gauze. Do not use any ointments.  Take the antibiotics until they are all gone.  You will be given an appointment to see Dr. Derrell Lolling in 9 or 10 days.  Call if this gets worse.

## 2013-02-07 NOTE — Telephone Encounter (Signed)
Patient calling stating he has had redness at his incision x 2 days. Today it has opened up and is draining pus. He is not having any fevers or any other symptoms. Bowels are moving. Dr Derrell Lolling paged. To see patient at 2:30 pm. Appt made. Patient made aware.

## 2013-02-07 NOTE — Progress Notes (Signed)
Patient ID: Nicholas [name], male   DOB: May 28, 1941, 72 y.o.   MRN: [id number] History: This patient returns to see me today because of a 2 or 3 day history of redness and purulent drainage from his midline wound.  He is otherwise doing well. Tolerating diet. Having frequent soft stools, which is his baseline. He has a complicated history of Crohn's disease with multiple operations. His last operation was 12/07/2012 at which time he had an elective laparotomy, lysis of adhesions, and closure of his ileostomy. He states that he has an appointment to see his gastroenterologist at [facility] in about 2 weeks  Exam: Patient looks well. In no distress. Abdomen soft. Nontender. Nondistended. In the lower midline there are couple of very localized reddened areas. I opened these up. This seemed to be a superficial infection. I pulled up and cut out two large PDS sutures with long knots on them. He tolerated this fairly well. The wound was packed with iodoform gauze and redressed  Assessment: Stitch abscess midline wound. Suture debridement today  cellulitis is very localized. Doubt deep space infection  Plan: Remove dressing and packing tomorrow and begin twice a day showers. Cover with dry gauze   Augmentin 875 mg twice a day x 8 days Return to see me in 9-10 days for wound check.  Angelia Mould. Derrell Lolling, M.D., [facility] General and Minimally invasive Surgery Breast and Colorectal Surgery Office:   [phone] Pager:   [phone]

## 2013-02-17 ENCOUNTER — Encounter (INDEPENDENT_AMBULATORY_CARE_PROVIDER_SITE_OTHER): Payer: Medicare Other | Admitting: General Surgery

## 2013-02-17 ENCOUNTER — Telehealth (INDEPENDENT_AMBULATORY_CARE_PROVIDER_SITE_OTHER): Payer: Self-pay

## 2013-02-17 NOTE — Telephone Encounter (Signed)
I called to check on the pt because he had not shown up for his visit today.  He is outside but his wife said they thought his appointment was tomorrow.  She states he is doing ok but was supposed to follow up after he finished the antibiotics.  She can't find his avs that has his appointment on it but Mr Banka wrote it on their calendar for tomorrow at 12:15 not today.  She asked if he can be seen today and I told her it would be too late.  I asked her to have the pt call me to discuss.  I may be able to squeeze him in tomorrow.

## 2013-02-24 ENCOUNTER — Encounter (INDEPENDENT_AMBULATORY_CARE_PROVIDER_SITE_OTHER): Payer: Self-pay | Admitting: General Surgery

## 2013-02-24 ENCOUNTER — Ambulatory Visit (INDEPENDENT_AMBULATORY_CARE_PROVIDER_SITE_OTHER): Payer: Medicare Other | Admitting: General Surgery

## 2013-02-24 VITALS — BP 112/80 | HR 45 | Temp 98.6°F | Resp 18 | Ht 69.0 in | Wt 147.8 lb

## 2013-02-24 DIAGNOSIS — K5090 Crohn's disease, unspecified, without complications: Secondary | ICD-10-CM

## 2013-02-24 NOTE — Patient Instructions (Signed)
Your abdominal wound has healed completely. There is no sign of infection or hernia.  The lab work that was done in Dr. Silvano Rusk office looks good.  If your stool frequency does not improve following discontinuing the antibiotics, call me and we will call it a trial prescription for Questran.  Return to see Dr. Derrell Lolling if further problems arise.

## 2013-02-24 NOTE — Progress Notes (Signed)
Patient ID: DEMARIOUS KAPUR, male   DOB: 1941/10/23, 72 y.o.   MRN: [id number] History: This patient returns following debridement of suture granulomas. The woundis now completely healed and he feels fine. He had lab work at Dr. Reuel Boom Paterson's office which revealed a normal prealbumin, normal vitamin B12 level, normal CBC, normal BUN creatinine and liver function tests. He has seen Dr. Chesley Mires, his gastroenterologist in [facility], and he was started on azathioprine. I gave him a copy of his operative report which describes the anastomosis at 14 cm. His gastroenterlogist was interested in that information. States that he feels good. He can eat most foods. Had an increase in stool frequency after  the antibiotics but that seems to be subsiding now.  Exam: Patient looks well. No distress Abdomen soft. Nontender. Midline wound completely healed. No hernia.  Assessment: Stitch abscess midline wound, now with complete healing following debridement complicated Crohn's disease with multiple operations. Most recent operation 11/29/2012 at which time he had laparotomy, lysis of adhesions and closure of his ileostomy with anastomosis at 14 cm  Plan: Diet and activities discussed If the stool frequency he does not markedly improve, he was advised to call me and we'll give him a trial of Questran   continue his close clinical followup with Dr. Eloise Harman and Dr. Chesley Mires, return to see me if further problems arise.    Angelia Mould. Derrell Lolling, M.D., [facility] General and Minimally invasive Surgery Breast and Colorectal Surgery Office:   [phone] Pager:   [phone]

## 2013-03-14 ENCOUNTER — Encounter: Payer: Self-pay | Admitting: Cardiology

## 2013-09-08 IMAGING — CR DG ABDOMEN 2V
2 series · 2 of 2 positions shown · non-contrast
Comparison: CT abdomen pelvis of 04/09/2012

CLINICAL DATA: Abdominal pain, history of Crohn's disease

ABDOMEN - 2 VIEW

[t abdomen supine]
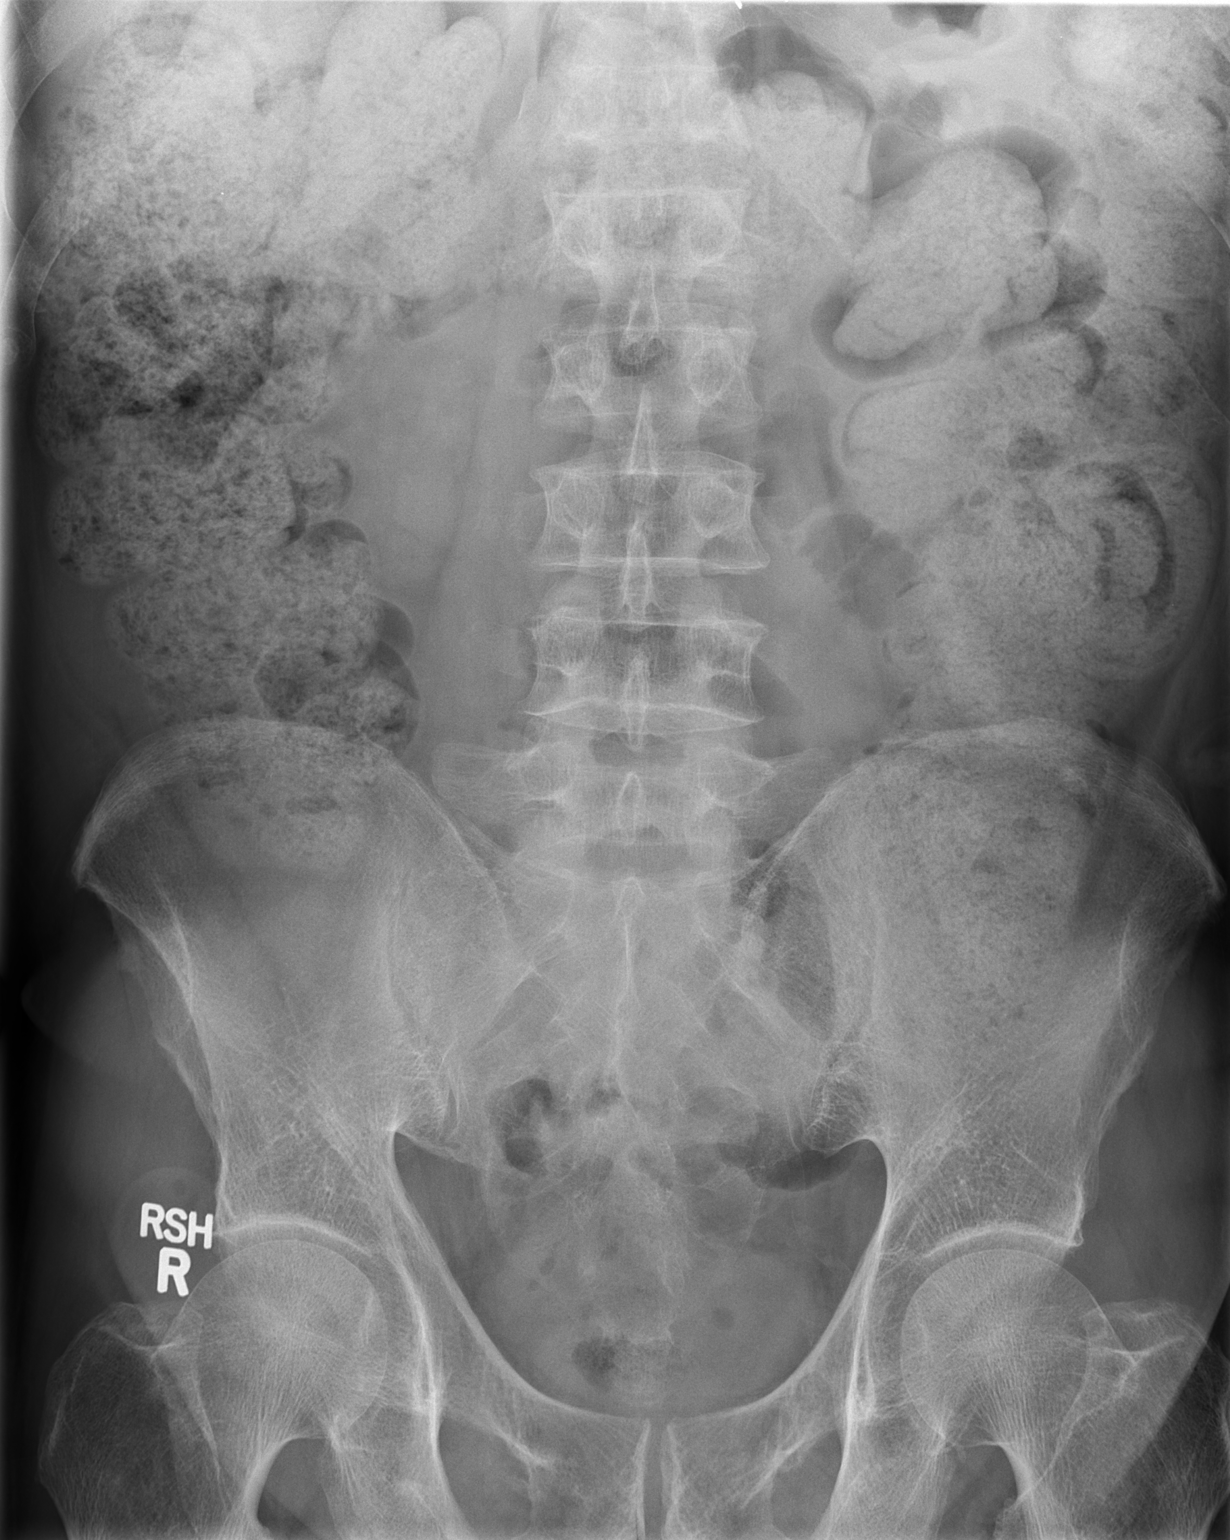

[w abdomen upright]
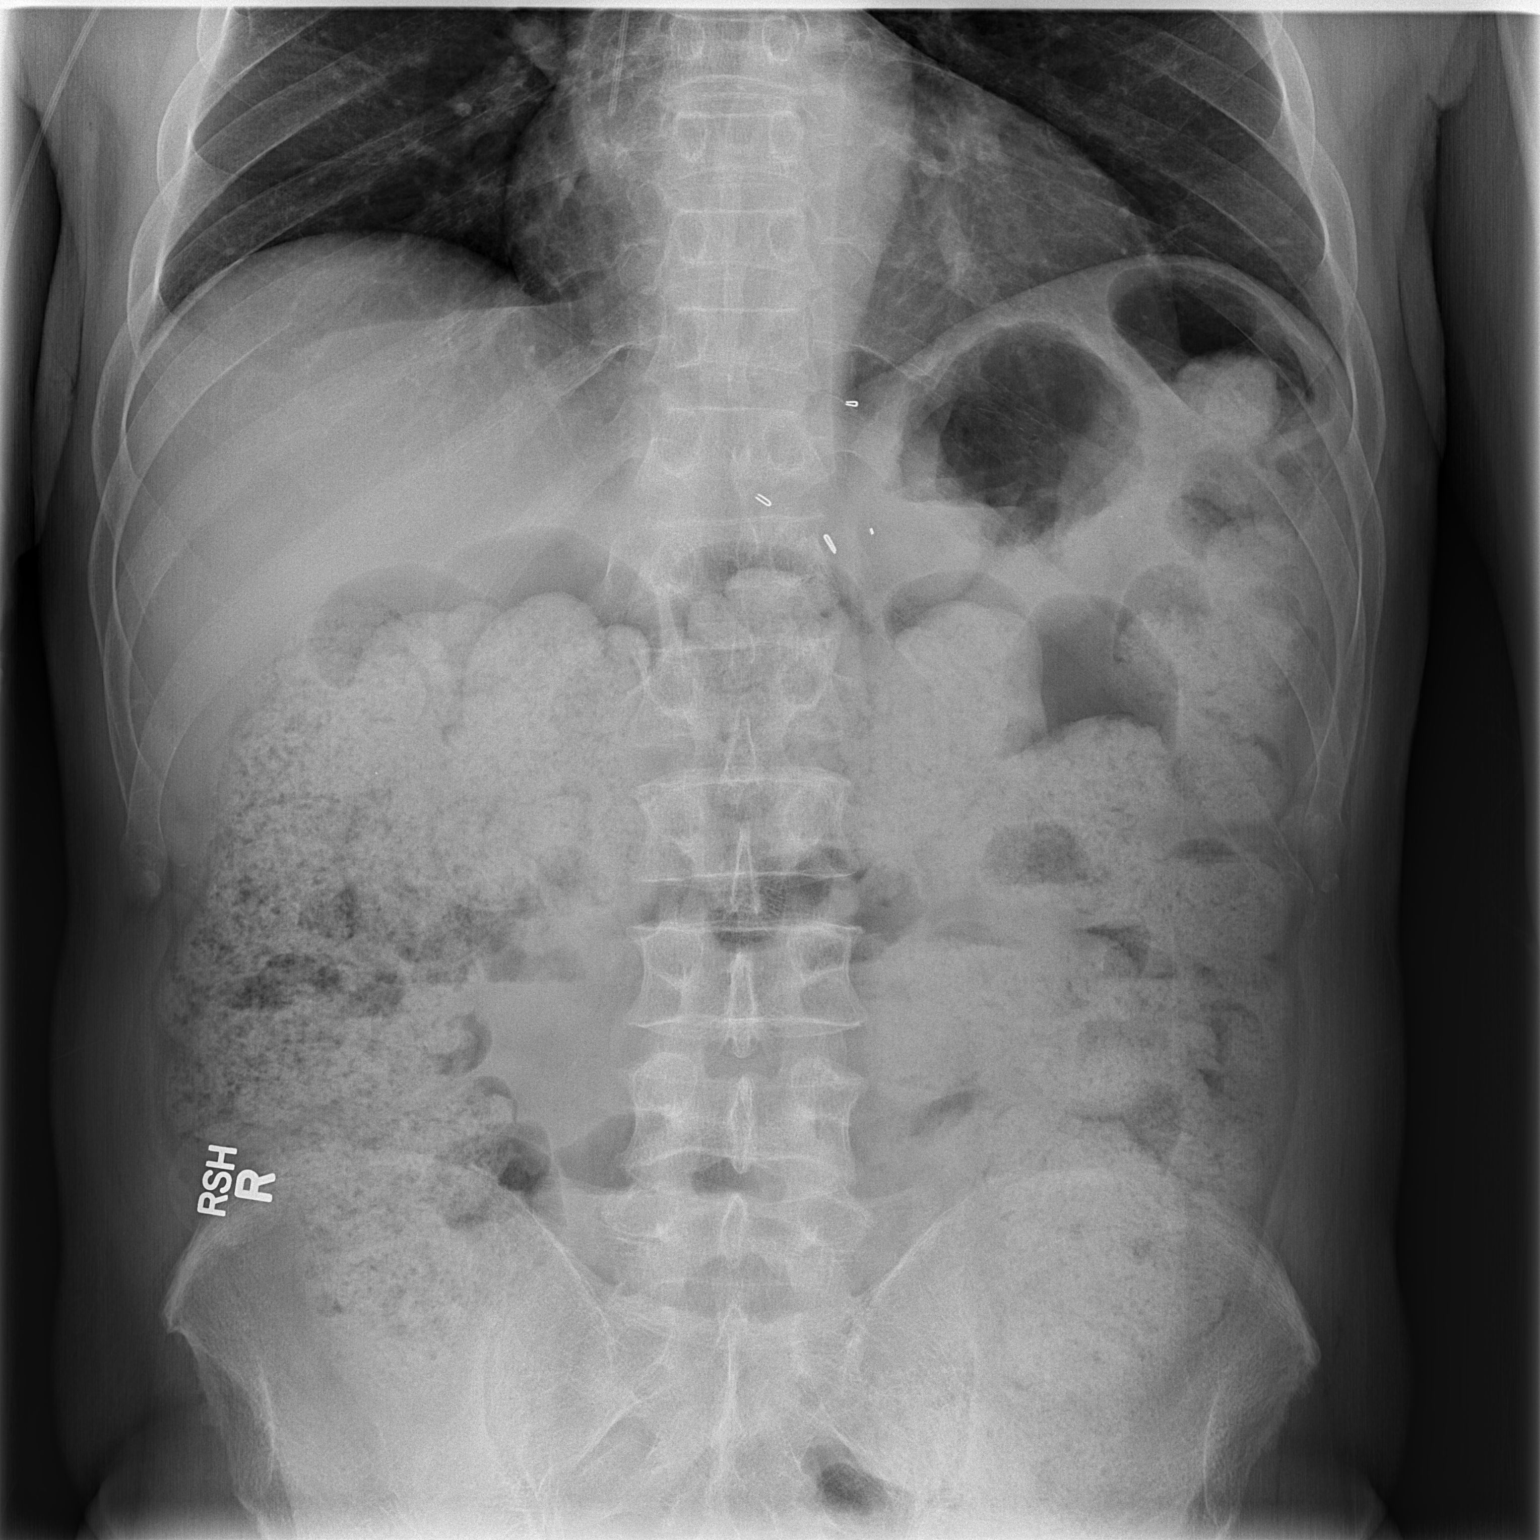

[2 of 2 positions shown; findings below may reference images not displayed]

FINDINGS: There is a moderately large amount of feces throughout
the entire colon.  No bowel obstruction is seen.  There are a few
air-fluid levels within nondistended small bowel loops.  On the
erect view no free intraperitoneal air is seen.  Surgical clips are
present around the GE junction.
IMPRESSION: Moderately large amount of feces throughout the colon.

## 2013-12-31 ENCOUNTER — Emergency Department (HOSPITAL_COMMUNITY)
Admission: EM | Admit: 2013-12-31 | Discharge: 2013-12-31 | Disposition: A | Discharge status: Home / Self Care | Payer: Medicare HMO | Attending: Emergency Medicine | Admitting: Emergency Medicine

## 2013-12-31 ENCOUNTER — Emergency Department (HOSPITAL_COMMUNITY): Payer: Medicare HMO

## 2013-12-31 ENCOUNTER — Encounter (HOSPITAL_COMMUNITY): Payer: Self-pay | Admitting: Emergency Medicine

## 2013-12-31 DIAGNOSIS — Y9289 Other specified places as the place of occurrence of the external cause: Secondary | ICD-10-CM | POA: Diagnosis not present

## 2013-12-31 DIAGNOSIS — Z8669 Personal history of other diseases of the nervous system and sense organs: Secondary | ICD-10-CM | POA: Insufficient documentation

## 2013-12-31 DIAGNOSIS — E785 Hyperlipidemia, unspecified: Secondary | ICD-10-CM | POA: Diagnosis not present

## 2013-12-31 DIAGNOSIS — D649 Anemia, unspecified: Secondary | ICD-10-CM | POA: Diagnosis not present

## 2013-12-31 DIAGNOSIS — Z87891 Personal history of nicotine dependence: Secondary | ICD-10-CM | POA: Diagnosis not present

## 2013-12-31 DIAGNOSIS — W010XXA Fall on same level from slipping, tripping and stumbling without subsequent striking against object, initial encounter: Secondary | ICD-10-CM | POA: Insufficient documentation

## 2013-12-31 DIAGNOSIS — R011 Cardiac murmur, unspecified: Secondary | ICD-10-CM | POA: Diagnosis not present

## 2013-12-31 DIAGNOSIS — Z8719 Personal history of other diseases of the digestive system: Secondary | ICD-10-CM | POA: Diagnosis not present

## 2013-12-31 DIAGNOSIS — Y9301 Activity, walking, marching and hiking: Secondary | ICD-10-CM | POA: Insufficient documentation

## 2013-12-31 DIAGNOSIS — S0990XA Unspecified injury of head, initial encounter: Secondary | ICD-10-CM | POA: Insufficient documentation

## 2013-12-31 DIAGNOSIS — W1809XA Striking against other object with subsequent fall, initial encounter: Secondary | ICD-10-CM | POA: Insufficient documentation

## 2013-12-31 DIAGNOSIS — Z7982 Long term (current) use of aspirin: Secondary | ICD-10-CM | POA: Diagnosis not present

## 2013-12-31 DIAGNOSIS — Z79899 Other long term (current) drug therapy: Secondary | ICD-10-CM | POA: Diagnosis not present

## 2013-12-31 DIAGNOSIS — Z8679 Personal history of other diseases of the circulatory system: Secondary | ICD-10-CM | POA: Insufficient documentation

## 2013-12-31 DIAGNOSIS — W009XXA Unspecified fall due to ice and snow, initial encounter: Secondary | ICD-10-CM

## 2013-12-31 DIAGNOSIS — IMO0002 Reserved for concepts with insufficient information to code with codable children: Secondary | ICD-10-CM | POA: Diagnosis not present

## 2013-12-31 NOTE — ED Notes (Signed)
Patient returned form CT then ambulated to bathroom with this RN.

## 2013-12-31 NOTE — ED Notes (Signed)
Patient states he felt dizzy when getting off CT table and when he went to restroom he felt dizzy. Patient states he felt normal when checking orthostatic BP.

## 2013-12-31 NOTE — ED Notes (Signed)
Patient discharged to home with family. NAD.  

## 2013-12-31 NOTE — ED Notes (Signed)
Patient presents to ED after slipping on ice and falling hitting his head this am. Patient states he does not remember falling and hitting his head. Abrasion noted to top back of head. Patient alert and oriented. Reports pain to right side of head.

## 2013-12-31 NOTE — Discharge Instructions (Signed)
Return to the ED for new or worsening symptoms including dizziness, visual disturbance, unilateral weakness, severe headache, slurred speech, or difficulty walking.

## 2013-12-31 NOTE — ED Provider Notes (Signed)
CSN: [id number]     Arrival date & time 12/31/13  0920 History   First MD Initiated Contact with Patient 12/31/13 [phone]     Chief Complaint  Patient presents with  . Fall   (Consider location/radiation/quality/duration/timing/severity/associated sxs/prior Treatment) The history is provided by the patient and medical records.   This is a 73 year old male with past medical history significant for hyperlipidemia, Crohn's disease, paroxysmal atrial fibrillation, GERD, presenting to the ED for fall. Patient states he was walking outside when he slipped on some ice, his feet slipped out from under him and he fell backwards hitting his head on the ground. No LOC.  Pts wife found him lying on the ground between the garage and their house and helped him to his feet.  Pt states initially he felt somewhat dizzy but this only lasted a few seconds.  Patient was able to get up and walk to the car.  He denies any feelings of syncope prior to his fall. The patient is on a daily aspirin but no other anticoagulants.  No prior hx of head trauma or intracranial surgeries.  Patient denies any current dizziness, lightheadedness, visual disturbance, tinnitus, headache, or neck pain.  VS stable on arrival.  Past Medical History  Diagnosis Date  . Hyperlipidemia   . Aortic stenosis, mild   . Crohn's disease   . Palpitations   . Macular degeneration   . Cataract   . Colonic hemorrhage 1964  . Heart murmur   . PAF (paroxysmal atrial fibrillation)     SEES DR. BRACKBILL  . PAC (premature atrial contraction)   . GERD (gastroesophageal reflux disease)   . Anemia     PO IRON    Past Surgical History  Procedure Laterality Date  . Tonsillectomy    . Stomach surgery  1959    ulcer  . US echocardiography  05/20/2010    EF 55-60%  . US echocardiography  10/23/2005    EF 55-60%  . Cardiovascular stress test  11/29/2003    EF 64%  . Appendectomy    . Bladder repair  1964    intestine leaks/bladder repair  .  Laparotomy  04/16/2012    Procedure: EXPLORATORY LAPAROTOMY;  Surgeon: Adin Hector, MD;  Location: [place];  Service: General;  Laterality: N/A;  . Ileostomy  04/16/2012    Procedure: ILEOSTOMY;  Surgeon: Adin Hector, MD;  Location: [place];  Service: General;  Laterality: N/A;  . Cataract extraction w/ intraocular lens  implant, bilateral      2013  . Ileostomy closure  12/07/2012  . Ileostomy closure  12/07/2012    Procedure: ILEOSTOMY TAKEDOWN;  Surgeon: Adin Hector, MD;  Location: [place];  Service: General;  Laterality: N/A;  . Lysis of adhesion  12/07/2012    Procedure: LYSIS OF ADHESION;  Surgeon: Adin Hector, MD;  Location: [place];  Service: General;  Laterality: N/A;  . Proctoscopy  12/07/2012    Procedure: PROCTOSCOPY;  Surgeon: Adin Hector, MD;  Location: [place];  Service: General;;  ILEO PROCTOSCOPY  . Cardiovascular stress test  11/17/2012    No evidence of ischemia; EF 66%; inferior wall attenuation favored to rep diaphragmatic attenuation   Family History  Problem Relation Age of Onset  . Heart disease Maternal Grandfather   . Heart disease      maternal side  . Colon cancer Neg Hx    History  Substance Use Topics  . Smoking status: Former Smoker -- 1.00  packs/day for 2 years    Quit date: 11/10/1964  . Smokeless tobacco: Never Used  . Alcohol Use: No    Review of Systems  Skin: Positive for wound.  All other systems reviewed and are negative.    Allergies  Review of patient's allergies indicates no known allergies.  Home Medications   Current Outpatient Rx  Name  Route  Sig  Dispense  Refill  . aspirin 81 MG EC tablet   Oral   Take 81 mg by mouth every evening.          [name] azaTHIOprine (IMURAN) 50 MG tablet   Oral   Take 50 mg by mouth daily.         . fenofibrate micronized (LOFIBRA) 200 MG capsule   Oral   Take 200 mg by mouth daily before breakfast.           . ferrous sulfate 325 (65 FE) MG tablet   Oral   Take 325 mg by mouth  2 (two) times daily.          . metoprolol (LOPRESSOR) 50 MG tablet   Oral   Take 25 mg by mouth 2 (two) times daily.          . Multiple Vitamin (MULTIVITAMIN) tablet   Oral   Take 1 tablet by mouth daily. centrum silver.         . multivitamin-lutein (OCUVITE-LUTEIN) CAPS   Oral   Take 1 capsule by mouth daily. For senior          BP 175/82  Pulse 57  Temp(Src) 97.6 F (36.4 C) (Oral)  Resp 14  Ht 5\' 7"  (1.702 m)  Wt 158 lb 5 oz (71.81 kg)  BMI 24.79 kg/m2  SpO2 100%  Physical Exam  Nursing note and vitals reviewed. Constitutional: He is oriented to person, place, and time. He appears well-developed and well-nourished. No distress.  HENT:  Head: Normocephalic. Head is with abrasion.    Right Ear: Tympanic membrane and ear canal normal.  Left Ear: Tympanic membrane and ear canal normal.  Nose: Nose normal.  Mouth/Throat: Uvula is midline, oropharynx is clear and moist and mucous membranes are normal. No oropharyngeal exudate, posterior oropharyngeal edema, posterior oropharyngeal erythema or tonsillar abscesses.  Small abrasion noted to posterior scalp, no active bleeding, no underlying hematoma; no hemotympanum  Eyes: Conjunctivae and EOM are normal. Pupils are equal, round, and reactive to light.  Neck: Normal range of motion.  Cardiovascular: Normal rate, regular rhythm and normal heart sounds.   Pulmonary/Chest: Effort normal and breath sounds normal. No respiratory distress. He has no wheezes.  Musculoskeletal: Normal range of motion.       Cervical back: Normal.  CS WNL, no pain with ROM  Neurological: He is alert and oriented to person, place, and time. He has normal strength. He displays no tremor. No cranial nerve deficit or sensory deficit. He displays no seizure activity.  AAOx3, answering questions and following commands appropriately; equal strength UE and LE bilaterally; CN grossly intact; moves all extremities appropriately without ataxia; no focal  neuro deficits or facial asymmetry appreciated  Skin: Skin is warm and dry. He is not diaphoretic.  Psychiatric: He has a normal mood and affect.    ED Course  Procedures (including critical care time) Labs Review Labs Reviewed - No data to display Imaging Review Ct Head Wo Contrast  12/31/2013   CLINICAL DATA:  Fall with head injury. Abrasion to top back of head.  EXAM: CT HEAD WITHOUT CONTRAST  TECHNIQUE: Contiguous axial images were obtained from the base of the skull through the vertex without intravenous contrast.  COMPARISON:  None.  FINDINGS: There is no evidence of acute cortical infarct, mass, midline shift, intracranial hemorrhage, or extra-axial fluid collection. There is mild to moderate cerebral atrophy. Prior bilateral cataract surgery is noted. Mastoid air cells are clear. There is mild right frontal and bilateral maxillary sinus mucosal thickening. No acute fracture is identified.  IMPRESSION: No acute intracranial abnormality.   Electronically Signed   By: Logan Bores   On: 12/31/2013 10:08    EKG Interpretation   None       MDM   Final diagnoses:  Fall due to ice or snow  Head injury   73 year old male presenting to the ED following a mechanical all on ice. Positive head trauma but no loss of consciousness. Patient is on daily ASA. Will obtain screening CT head.  C-spine cleared by NEXUS criteria, pt without current pain.  CT head negative for acute findings. Neurologic exam remains intact.  Pt will be discharged.  FU with PCP.  Strict return precautions advised for new or worsening sx-- visual disturbance, dizziness, unilateral weakness, syncope, etc.  Discussed with Dr. Dina Rich who personally evaluated pt and agrees with plan of care.  Larene Pickett, PA-C 12/31/13 1236

## 2013-12-31 NOTE — ED Notes (Signed)
Patient to CT with this RN.

## 2014-01-01 NOTE — ED Provider Notes (Signed)
Medical screening examination/treatment/procedure(s) were conducted as a shared visit with non-physician practitioner(s) and myself.  I personally evaluated the patient during the encounter.  EKG Interpretation   None       Patient presents following a fall. It was a mechanical fall on the ice. No evidence of external problem exam and neurologically intact. Only on aspirin daily. CT scan negative and C-spine cleared by Nexus criteria. Patient only complaining of mild headache. Patient to followup with primary doctor. Given strict return precautions.  After history, exam, and medical workup I feel the patient has been appropriately medically screened and is safe for discharge home. Pertinent diagnoses were discussed with the patient. Patient was given return precautions.   Merryl Hacker, MD 01/01/14 [phone]

## 2014-02-28 DIAGNOSIS — L989 Disorder of the skin and subcutaneous tissue, unspecified: Secondary | ICD-10-CM | POA: Insufficient documentation

## 2014-02-28 DIAGNOSIS — R5383 Other fatigue: Secondary | ICD-10-CM | POA: Insufficient documentation

## 2014-02-28 DIAGNOSIS — E291 Testicular hypofunction: Secondary | ICD-10-CM | POA: Insufficient documentation

## 2014-05-30 DIAGNOSIS — H6121 Impacted cerumen, right ear: Secondary | ICD-10-CM | POA: Insufficient documentation

## 2014-09-04 DIAGNOSIS — R7301 Impaired fasting glucose: Secondary | ICD-10-CM | POA: Insufficient documentation

## 2014-09-04 DIAGNOSIS — E559 Vitamin D deficiency, unspecified: Secondary | ICD-10-CM | POA: Insufficient documentation

## 2015-02-23 ENCOUNTER — Inpatient Hospital Stay (HOSPITAL_COMMUNITY)
Admission: EM | Admit: 2015-02-23 | Discharge: 2015-02-25 | DRG: 872 | Disposition: A | Discharge status: Home / Self Care | Payer: PPO | Attending: Internal Medicine | Admitting: Internal Medicine

## 2015-02-23 ENCOUNTER — Encounter (HOSPITAL_COMMUNITY): Payer: Self-pay | Admitting: Emergency Medicine

## 2015-02-23 ENCOUNTER — Emergency Department (HOSPITAL_COMMUNITY): Payer: PPO

## 2015-02-23 DIAGNOSIS — K5090 Crohn's disease, unspecified, without complications: Secondary | ICD-10-CM | POA: Diagnosis present

## 2015-02-23 DIAGNOSIS — Z9842 Cataract extraction status, left eye: Secondary | ICD-10-CM

## 2015-02-23 DIAGNOSIS — E860 Dehydration: Secondary | ICD-10-CM | POA: Diagnosis present

## 2015-02-23 DIAGNOSIS — Z87891 Personal history of nicotine dependence: Secondary | ICD-10-CM

## 2015-02-23 DIAGNOSIS — Z9049 Acquired absence of other specified parts of digestive tract: Secondary | ICD-10-CM | POA: Diagnosis present

## 2015-02-23 DIAGNOSIS — R509 Fever, unspecified: Secondary | ICD-10-CM | POA: Diagnosis not present

## 2015-02-23 DIAGNOSIS — A419 Sepsis, unspecified organism: Secondary | ICD-10-CM

## 2015-02-23 DIAGNOSIS — Z7982 Long term (current) use of aspirin: Secondary | ICD-10-CM

## 2015-02-23 DIAGNOSIS — K219 Gastro-esophageal reflux disease without esophagitis: Secondary | ICD-10-CM | POA: Diagnosis present

## 2015-02-23 DIAGNOSIS — H3530 Unspecified macular degeneration: Secondary | ICD-10-CM | POA: Diagnosis present

## 2015-02-23 DIAGNOSIS — B349 Viral infection, unspecified: Secondary | ICD-10-CM | POA: Diagnosis present

## 2015-02-23 DIAGNOSIS — Z961 Presence of intraocular lens: Secondary | ICD-10-CM | POA: Diagnosis present

## 2015-02-23 DIAGNOSIS — I480 Paroxysmal atrial fibrillation: Secondary | ICD-10-CM | POA: Diagnosis present

## 2015-02-23 DIAGNOSIS — E876 Hypokalemia: Secondary | ICD-10-CM | POA: Diagnosis present

## 2015-02-23 DIAGNOSIS — E785 Hyperlipidemia, unspecified: Secondary | ICD-10-CM | POA: Diagnosis present

## 2015-02-23 DIAGNOSIS — R6510 Systemic inflammatory response syndrome (SIRS) of non-infectious origin without acute organ dysfunction: Secondary | ICD-10-CM | POA: Diagnosis present

## 2015-02-23 DIAGNOSIS — Z932 Ileostomy status: Secondary | ICD-10-CM

## 2015-02-23 DIAGNOSIS — N179 Acute kidney failure, unspecified: Secondary | ICD-10-CM | POA: Diagnosis present

## 2015-02-23 DIAGNOSIS — K529 Noninfective gastroenteritis and colitis, unspecified: Secondary | ICD-10-CM | POA: Diagnosis present

## 2015-02-23 DIAGNOSIS — Z9841 Cataract extraction status, right eye: Secondary | ICD-10-CM

## 2015-02-23 DIAGNOSIS — I350 Nonrheumatic aortic (valve) stenosis: Secondary | ICD-10-CM | POA: Diagnosis present

## 2015-02-23 HISTORY — DX: Sepsis, unspecified organism: A41.9

## 2015-02-23 HISTORY — DX: Systemic inflammatory response syndrome (sirs) of non-infectious origin without acute organ dysfunction: R65.10

## 2015-02-23 LAB — CBC WITH DIFFERENTIAL/PLATELET
Basophils Absolute: 0 10*3/uL (ref 0.0–0.1)
Basophils Relative: 0 % (ref 0–1)
EOS ABS: 0.1 10*3/uL (ref 0.0–0.7)
Eosinophils Relative: 1 % (ref 0–5)
HCT: 39.4 % (ref 39.0–52.0)
Hemoglobin: 13.6 g/dL (ref 13.0–17.0)
LYMPHS ABS: 0.2 10*3/uL — AB (ref 0.7–4.0)
LYMPHS PCT: 2 % — AB (ref 12–46)
MCH: 32.4 pg (ref 26.0–34.0)
MCHC: 34.5 g/dL (ref 30.0–36.0)
MCV: 93.8 fL (ref 78.0–100.0)
MONOS PCT: 4 % (ref 3–12)
Monocytes Absolute: 0.4 10*3/uL (ref 0.1–1.0)
NEUTROS PCT: 93 % — AB (ref 43–77)
Neutro Abs: 9 10*3/uL — ABNORMAL HIGH (ref 1.7–7.7)
Platelets: 172 10*3/uL (ref 150–400)
RBC: 4.2 MIL/uL — ABNORMAL LOW (ref 4.22–5.81)
RDW: 13.3 % (ref 11.5–15.5)
WBC: 9.7 10*3/uL (ref 4.0–10.5)

## 2015-02-23 LAB — COMPREHENSIVE METABOLIC PANEL
ALK PHOS: 52 U/L (ref 39–117)
ALT: 16 U/L (ref 0–53)
ANION GAP: 12 (ref 5–15)
AST: 28 U/L (ref 0–37)
Albumin: 3.6 g/dL (ref 3.5–5.2)
BUN: 13 mg/dL (ref 6–23)
CALCIUM: 9 mg/dL (ref 8.4–10.5)
CO2: 19 mmol/L (ref 19–32)
Chloride: 104 mmol/L (ref 96–112)
Creatinine, Ser: 1.39 mg/dL — ABNORMAL HIGH (ref 0.50–1.35)
GFR, EST AFRICAN AMERICAN: 56 mL/min — AB (ref >90)
GFR, EST NON AFRICAN AMERICAN: 48 mL/min — AB (ref >90)
GLUCOSE: 139 mg/dL — AB (ref 70–99)
Potassium: 3.3 mmol/L — ABNORMAL LOW (ref 3.5–5.1)
SODIUM: 135 mmol/L (ref 135–145)
TOTAL PROTEIN: 6.1 g/dL (ref 6.0–8.3)
Total Bilirubin: 0.9 mg/dL (ref 0.3–1.2)

## 2015-02-23 LAB — URINALYSIS, ROUTINE W REFLEX MICROSCOPIC
BILIRUBIN URINE: NEGATIVE
GLUCOSE, UA: NEGATIVE mg/dL
Hgb urine dipstick: NEGATIVE
KETONES UR: NEGATIVE mg/dL
LEUKOCYTES UA: NEGATIVE
NITRITE: NEGATIVE
PH: 8.5 — AB (ref 5.0–8.0)
Protein, ur: NEGATIVE mg/dL
Specific Gravity, Urine: 1.013 (ref 1.005–1.030)
UROBILINOGEN UA: 0.2 mg/dL (ref 0.0–1.0)

## 2015-02-23 LAB — I-STAT CG4 LACTIC ACID, ED
Lactic Acid, Venous: 3.66 mmol/L (ref 0.5–2.0)
Lactic Acid, Venous: 1.78 mmol/L (ref 0.5–2.0)

## 2015-02-23 MED ORDER — VANCOMYCIN HCL IN DEXTROSE 750-5 MG/150ML-% IV SOLN
750.0000 mg | Freq: Two times a day (BID) | INTRAVENOUS | Status: DC
  Administered 2015-02-24 – 2015-02-25 (×3): 750 mg via INTRAVENOUS
  Filled 2015-02-23 (×5): qty 150

## 2015-02-23 MED ORDER — SODIUM CHLORIDE 0.9 % IV BOLUS (SEPSIS)
1000.0000 mL | Freq: Once | INTRAVENOUS | Status: AC
  Administered 2015-02-23: 1000 mL via INTRAVENOUS

## 2015-02-23 MED ORDER — PIPERACILLIN-TAZOBACTAM 3.375 G IVPB 30 MIN
3.3750 g | Freq: Once | INTRAVENOUS | Status: AC
  Administered 2015-02-23: 3.375 g via INTRAVENOUS
  Filled 2015-02-23: qty 50

## 2015-02-23 MED ORDER — AZATHIOPRINE 50 MG PO TABS
50.0000 mg | ORAL_TABLET | Freq: Every day | ORAL | Status: DC
  Administered 2015-02-23 – 2015-02-25 (×3): 50 mg via ORAL
  Filled 2015-02-23 (×3): qty 1

## 2015-02-23 MED ORDER — SODIUM CHLORIDE 0.9 % IV SOLN
INTRAVENOUS | Status: DC
  Administered 2015-02-23 – 2015-02-25 (×3): via INTRAVENOUS
  Administered 2015-02-25: 1 mL via INTRAVENOUS

## 2015-02-23 MED ORDER — POTASSIUM CHLORIDE CRYS ER 20 MEQ PO TBCR
40.0000 meq | EXTENDED_RELEASE_TABLET | Freq: Two times a day (BID) | ORAL | Status: AC
  Administered 2015-02-23 – 2015-02-24 (×2): 40 meq via ORAL
  Filled 2015-02-23 (×2): qty 2

## 2015-02-23 MED ORDER — ENOXAPARIN SODIUM 30 MG/0.3ML ~~LOC~~ SOLN
30.0000 mg | SUBCUTANEOUS | Status: DC
  Administered 2015-02-23: 30 mg via SUBCUTANEOUS
  Filled 2015-02-23: qty 0.3

## 2015-02-23 MED ORDER — PIPERACILLIN-TAZOBACTAM 3.375 G IVPB
3.3750 g | Freq: Three times a day (TID) | INTRAVENOUS | Status: DC
  Administered 2015-02-24 – 2015-02-25 (×5): 3.375 g via INTRAVENOUS
  Filled 2015-02-23 (×7): qty 50

## 2015-02-23 MED ORDER — METRONIDAZOLE 500 MG PO TABS
500.0000 mg | ORAL_TABLET | Freq: Three times a day (TID) | ORAL | Status: DC
  Administered 2015-02-23 – 2015-02-25 (×5): 500 mg via ORAL
  Filled 2015-02-23 (×5): qty 1

## 2015-02-23 MED ORDER — VANCOMYCIN HCL 10 G IV SOLR
1500.0000 mg | Freq: Once | INTRAVENOUS | Status: AC
  Administered 2015-02-23: 1500 mg via INTRAVENOUS
  Filled 2015-02-23: qty 1500

## 2015-02-23 NOTE — ED Notes (Signed)
Report given to floor

## 2015-02-23 NOTE — H&P (Signed)
[facility] History and Physical  EMMERICH CRYER XAJ:[id number] DOB: 08-02-1941 DOA: 02/23/2015  Referring physician: EDP PCP: Donnajean Lopes, MD   Chief Complaint: fever and chills  HPI: Nicholas [name] is a 74 y.o. male with h/o crohn's disease, follows at [facility] , comes in for chills and a fever of 103. He denies any sob, chest pain, upper respiratory symptoms, nausea or vomiting. He reports 3 loose BM today and that is normal for him. He denies any travel or sick contacts, he denies using any antibiotics recently. On arrival to ED, he was found to be febrile, and labs reveal elevated lactic acid,  Elevated neutrophil count, hypokalemia and in acute renal failure. He was referred to admission to medical service for observation overnight.    Review of Systems:  Constitutional:  No weight loss, night sweats , positive for chills, fever and fatigue HEENT:  No headaches, Difficulty swallowing,Tooth/dental problems,Sore throat,  No sneezing, itching, ear ache, nasal congestion, post nasal drip,  Cardio-vascular:  No chest pain, Orthopnea, PND, swelling in lower extremities, anasarca, dizziness, palpitations  GI:  Positive for diarrhea only.  Resp:  No shortness of breath with exertion or at rest. No excess mucus, no productive cough, No non-productive cough, No coughing up of blood.No change in color of mucus.No wheezing.No chest wall deformity  Skin:  no rash or lesions.  GU:  no dysuria, change in color of urine, no urgency or frequency. No flank pain.  Musculoskeletal:  No joint pain or swelling. No decreased range of motion. No back pain.  Psych:  No change in mood or affect. No depression or anxiety. No memory loss.   Past Medical History  Diagnosis Date  . Hyperlipidemia   . Aortic stenosis, mild   . Crohn's disease   . Palpitations   . Macular degeneration   . Cataract   . Colonic hemorrhage 1964  . Heart murmur   . PAF (paroxysmal atrial  fibrillation)     SEES DR. BRACKBILL  . PAC (premature atrial contraction)   . GERD (gastroesophageal reflux disease)   . Anemia     PO IRON    Past Surgical History  Procedure Laterality Date  . Tonsillectomy    . Stomach surgery  1959    ulcer  . US echocardiography  05/20/2010    EF 55-60%  . US echocardiography  10/23/2005    EF 55-60%  . Cardiovascular stress test  11/29/2003    EF 64%  . Appendectomy    . Bladder repair  1964    intestine leaks/bladder repair  . Laparotomy  04/16/2012    Procedure: EXPLORATORY LAPAROTOMY;  Surgeon: Adin Hector, MD;  Location: [place];  Service: General;  Laterality: N/A;  . Ileostomy  04/16/2012    Procedure: ILEOSTOMY;  Surgeon: Adin Hector, MD;  Location: [place];  Service: General;  Laterality: N/A;  . Cataract extraction w/ intraocular lens  implant, bilateral      2013  . Ileostomy closure  12/07/2012  . Ileostomy closure  12/07/2012    Procedure: ILEOSTOMY TAKEDOWN;  Surgeon: Adin Hector, MD;  Location: [place];  Service: General;  Laterality: N/A;  . Lysis of adhesion  12/07/2012    Procedure: LYSIS OF ADHESION;  Surgeon: Adin Hector, MD;  Location: [place];  Service: General;  Laterality: N/A;  . Proctoscopy  12/07/2012    Procedure: PROCTOSCOPY;  Surgeon: Adin Hector, MD;  Location: [place];  Service: General;;  ILEO PROCTOSCOPY  . Cardiovascular stress test  11/17/2012    No evidence of ischemia; EF 66%; inferior wall attenuation favored to rep diaphragmatic attenuation   Social History:  reports that he quit smoking about 50 years ago. He has never used smokeless tobacco. He reports that he does not drink alcohol or use illicit drugs.  No Known Allergies  Family History  Problem Relation Age of Onset  . Heart disease Maternal Grandfather   . Heart disease      maternal side  . Colon cancer Neg Hx     Prior to Admission medications   Medication Sig Start Date End Date Taking? Authorizing Provider  aspirin 81 MG  EC tablet Take 81 mg by mouth every evening.     Historical Provider, MD  azaTHIOprine (IMURAN) 50 MG tablet Take 50 mg by mouth daily.    Historical Provider, MD  fenofibrate (LOFIBRA) 160 MG tablet Take 160 mg by mouth daily.    Historical Provider, MD  ferrous sulfate 325 (65 FE) MG tablet Take 325 mg by mouth 2 (two) times daily.     Historical Provider, MD  metoprolol (LOPRESSOR) 50 MG tablet Take 25 mg by mouth 2 (two) times daily.  11/14/11   Historical Provider, MD  Multiple Vitamin (MULTIVITAMIN) tablet Take 1 tablet by mouth daily. centrum silver.    Historical Provider, MD  multivitamin-lutein [facility]) CAPS Take 1 capsule by mouth daily. For senior    Historical Provider, MD   Physical Exam: Filed Vitals:   02/23/15 1510 02/23/15 1513 02/23/15 1603 02/23/15 1630  BP:  122/50 114/54 127/54  Pulse:  70 60 73  Temp: 102.6 F (39.2 C)     TempSrc: Rectal     Resp:  20 18 18   Height: 5\' 8"  (1.727 m)     Weight: 73.653 kg (162 lb 6 oz)     SpO2:  97% 97% 97%    Wt Readings from Last 3 Encounters:  02/23/15 73.653 kg (162 lb 6 oz)  12/31/13 71.81 kg (158 lb 5 oz)  02/24/13 67.042 kg (147 lb 12.8 oz)    General:  Appears calm and comfortable Eyes: PERRL, normal lids, irises & conjunctiva ENT: grossly normal hearing, lips & tongue Neck: no LAD, masses or thyromegaly Cardiovascular: RRR, no m/r/g. No LE edema. Respiratory: CTA bilaterally, no w/r/r. Normal respiratory effort. Abdomen: soft, ntnd Skin: no rash or induration seen on limited exam Musculoskeletal: grossly normal tone BUE/BLE Psychiatric: grossly normal mood and affect, speech fluent and appropriate Neurologic: grossly non-focal.          Labs on Admission:  Basic Metabolic Panel:  Recent Labs Lab 02/23/15 1534  NA 135  K 3.3*  CL 104  CO2 19  GLUCOSE 139*  BUN 13  CREATININE 1.39*  CALCIUM 9.0   Liver Function Tests:  Recent Labs Lab 02/23/15 1534  AST 28  ALT 16  ALKPHOS 52    BILITOT 0.9  PROT 6.1  ALBUMIN 3.6   No results for input(s): LIPASE, AMYLASE in the last 168 hours. No results for input(s): AMMONIA in the last 168 hours. CBC:  Recent Labs Lab 02/23/15 1534  WBC 9.7  NEUTROABS 9.0*  HGB 13.6  HCT 39.4  MCV 93.8  PLT 172   Cardiac Enzymes: No results for input(s): CKTOTAL, CKMB, CKMBINDEX, TROPONINI in the last 168 hours.  BNP (last 3 results) No results for input(s): BNP in the last 8760 hours.  ProBNP (last 3 results) No results  for input(s): PROBNP in the last 8760 hours.  CBG: No results for input(s): GLUCAP in the last 168 hours.  Radiological Exams on Admission: Dg Chest Port 1 View  02/23/2015   CLINICAL DATA:  From home; woke up this morning with weakness and chills. Felt normal yesterday. Denies N/V, reports his diarrhea is normal for him. Pt also has a high fever  EXAM: PORTABLE CHEST - 1 VIEW  COMPARISON:  12/01/2012  FINDINGS: Cardiac silhouette normal in size and configuration. Aorta is slightly uncoiled. No mediastinal or hilar masses or evidence of adenopathy.  Clear lungs.  No pleural effusion or pneumothorax.  Bony thorax is intact.  IMPRESSION: No active disease.   Electronically Signed   By: Lajean Manes M.D.   On: 02/23/2015 15:55    EKG: not done.   Assessment/Plan Active Problems:   Sepsis   SIRS (systemic inflammatory response syndrome)   Fever chills, elevated lactic acid, dehydration, left shift : Admit the patient for SIRS. Blood cultures ordered, . UA and CXR negative.  Influenza pcr, and gi pathogen and c diff ordered.  Started him empirically on broad spectrum antibiotics IV vanco, zosyn and flagyl.  Resume home dose of imuran.  Would not start him on steroids yet, do not suspect crohn's flare up,   But please call GI in am, if he reports worsening diarrhea and gi symptoms.    Acute renal failure: probably from dehdyration,  Started him on hydration, UA Is negative.    Hypokalemia: replete as  needed.    Code Status:full code.  DVT Prophylaxis: Family Communication: multiple family memebers at bedside Disposition Plan: admit to med surg  Time spent: 50 min  [facility] Pager [phone]

## 2015-02-23 NOTE — ED Notes (Signed)
Notified phlebotomy of need for second set of BC.

## 2015-02-23 NOTE — ED Notes (Signed)
Attempted to call report

## 2015-02-23 NOTE — ED Provider Notes (Signed)
CSN: [id number]     Arrival date & time 02/23/15  1505 History   First MD Initiated Contact with Patient 02/23/15 1516     Chief Complaint  Patient presents with  . Fever     (Consider location/radiation/quality/duration/timing/severity/associated sxs/prior Treatment) Patient is a 74 y.o. male presenting with general illness.  Illness Quality:  Chills Severity:  Severe Onset quality:  Gradual Duration:  8 hours Timing:  Constant Progression:  Worsening Chronicity:  New Context:  Woke up this morning slightly chilled, however, around lunchtime, chills became severe.  He took his temperature at home and found it to be "low" .  Then EMS checked it and it was 103.5.   Relieved by:  Tylenol Associated symptoms: diarrhea (not worse than usual) and fever   Associated symptoms: no abdominal pain, no chest pain, no congestion, no cough, no myalgias, no nausea, no rash, no shortness of breath, no sore throat and no vomiting   Associated symptoms comment:  Chills   Past Medical History  Diagnosis Date  . Hyperlipidemia   . Aortic stenosis, mild   . Crohn's disease   . Palpitations   . Macular degeneration   . Cataract   . Colonic hemorrhage 1964  . Heart murmur   . PAF (paroxysmal atrial fibrillation)     SEES DR. BRACKBILL  . PAC (premature atrial contraction)   . GERD (gastroesophageal reflux disease)   . Anemia     PO IRON    Past Surgical History  Procedure Laterality Date  . Tonsillectomy    . Stomach surgery  1959    ulcer  . US echocardiography  05/20/2010    EF 55-60%  . US echocardiography  10/23/2005    EF 55-60%  . Cardiovascular stress test  11/29/2003    EF 64%  . Appendectomy    . Bladder repair  1964    intestine leaks/bladder repair  . Laparotomy  04/16/2012    Procedure: EXPLORATORY LAPAROTOMY;  Surgeon: Adin Hector, MD;  Location: [place];  Service: General;  Laterality: N/A;  . Ileostomy  04/16/2012    Procedure: ILEOSTOMY;  Surgeon: Adin Hector,  MD;  Location: [place];  Service: General;  Laterality: N/A;  . Cataract extraction w/ intraocular lens  implant, bilateral      2013  . Ileostomy closure  12/07/2012  . Ileostomy closure  12/07/2012    Procedure: ILEOSTOMY TAKEDOWN;  Surgeon: Adin Hector, MD;  Location: [place];  Service: General;  Laterality: N/A;  . Lysis of adhesion  12/07/2012    Procedure: LYSIS OF ADHESION;  Surgeon: Adin Hector, MD;  Location: [place];  Service: General;  Laterality: N/A;  . Proctoscopy  12/07/2012    Procedure: PROCTOSCOPY;  Surgeon: Adin Hector, MD;  Location: [place];  Service: General;;  ILEO PROCTOSCOPY  . Cardiovascular stress test  11/17/2012    No evidence of ischemia; EF 66%; inferior wall attenuation favored to rep diaphragmatic attenuation   Family History  Problem Relation Age of Onset  . Heart disease Maternal Grandfather   . Heart disease      maternal side  . Colon cancer Neg Hx    History  Substance Use Topics  . Smoking status: Former Smoker -- 1.00 packs/day for 2 years    Quit date: 11/10/1964  . Smokeless tobacco: Never Used  . Alcohol Use: No    Review of Systems  Constitutional: Positive for fever.  HENT: Negative for congestion and sore  throat.   Respiratory: Negative for cough and shortness of breath.   Cardiovascular: Negative for chest pain.  Gastrointestinal: Positive for diarrhea (not worse than usual). Negative for nausea, vomiting and abdominal pain.  Musculoskeletal: Negative for myalgias.  Skin: Negative for rash.  All other systems reviewed and are negative.     Allergies  Review of patient's allergies indicates no known allergies.  Home Medications   Prior to Admission medications   Medication Sig Start Date End Date Taking? Authorizing Provider  aspirin 81 MG EC tablet Take 81 mg by mouth every evening.     Historical Provider, MD  azaTHIOprine (IMURAN) 50 MG tablet Take 50 mg by mouth daily.    Historical Provider, MD  fenofibrate  (LOFIBRA) 160 MG tablet Take 160 mg by mouth daily.    Historical Provider, MD  ferrous sulfate 325 (65 FE) MG tablet Take 325 mg by mouth 2 (two) times daily.     Historical Provider, MD  metoprolol (LOPRESSOR) 50 MG tablet Take 25 mg by mouth 2 (two) times daily.  11/14/11   Historical Provider, MD  Multiple Vitamin (MULTIVITAMIN) tablet Take 1 tablet by mouth daily. centrum silver.    Historical Provider, MD  multivitamin-lutein [facility]) CAPS Take 1 capsule by mouth daily. For senior    Historical Provider, MD   BP 122/50 mmHg  Pulse 70  Temp(Src) 102.6 F (39.2 C) (Rectal)  Resp 20  Ht 5\' 8"  (1.727 m)  Wt 162 lb 6 oz (73.653 kg)  BMI 24.69 kg/m2  SpO2 97% Physical Exam  Constitutional: He is oriented to person, place, and time. He appears well-developed and well-nourished. No distress.  Feels warm  HENT:  Head: Normocephalic and atraumatic.  Mouth/Throat: Oropharynx is clear and moist.  Eyes: Conjunctivae are normal. Pupils are equal, round, and reactive to light. No scleral icterus.  Neck: Neck supple.  Cardiovascular: Normal rate, regular rhythm, normal heart sounds and intact distal pulses.   No murmur heard. Pulmonary/Chest: Effort normal and breath sounds normal. No stridor. No respiratory distress. He has no wheezes. He has no rales.  Abdominal: Soft. He exhibits no distension. There is no tenderness. There is no rebound and no guarding.  Musculoskeletal: Normal range of motion. He exhibits no edema.  Neurological: He is alert and oriented to person, place, and time.  Skin: Skin is warm and dry. No rash noted.  Psychiatric: He has a normal mood and affect. His behavior is normal.  Nursing note and vitals reviewed.   ED Course  Procedures (including critical care time) Labs Review Labs Reviewed  CBC WITH DIFFERENTIAL/PLATELET - Abnormal; Notable for the following:    RBC 4.20 (*)    Neutrophils Relative % 93 (*)    Neutro Abs 9.0 (*)    Lymphocytes Relative 2  (*)    Lymphs Abs 0.2 (*)    All other components within normal limits  COMPREHENSIVE METABOLIC PANEL - Abnormal; Notable for the following:    Potassium 3.3 (*)    Glucose, Bld 139 (*)    Creatinine, Ser 1.39 (*)    GFR calc non Af Amer 48 (*)    GFR calc Af Amer 56 (*)    All other components within normal limits  URINALYSIS, ROUTINE W REFLEX MICROSCOPIC - Abnormal; Notable for the following:    pH 8.5 (*)    All other components within normal limits  I-STAT CG4 LACTIC ACID, ED - Abnormal; Notable for the following:    Lactic Acid, Venous  3.66 (*)    All other components within normal limits  CULTURE, BLOOD (ROUTINE X 2)  CULTURE, BLOOD (ROUTINE X 2)  URINE CULTURE  INFLUENZA PANEL BY PCR (TYPE A & B, H1N1)    Imaging Review Dg Chest Port 1 View  02/23/2015   CLINICAL DATA:  From home; woke up this morning with weakness and chills. Felt normal yesterday. Denies N/V, reports his diarrhea is normal for him. Pt also has a high fever  EXAM: PORTABLE CHEST - 1 VIEW  COMPARISON:  12/01/2012  FINDINGS: Cardiac silhouette normal in size and configuration. Aorta is slightly uncoiled. No mediastinal or hilar masses or evidence of adenopathy.  Clear lungs.  No pleural effusion or pneumothorax.  Bony thorax is intact.  IMPRESSION: No active disease.   Electronically Signed   By: Lajean Manes M.D.   On: 02/23/2015 15:55  All radiology studies independently viewed by me.      EKG Interpretation None      MDM   Final diagnoses:  Sepsis, due to unspecified organism  Fever, unspecified fever cause    74 yo male with hx of Crohn's disease who reports taking Humira who presents with high fever.  103.5 via EMS, 102.6 rectal on arrival here.  He is well appearing and nontoxic.  Only complaint is chills.  He feels like his Crohn's symptoms have been under good control recently, including today.  Due to high fever and immunosuppression, will give empiric antibiotics for unknown source.    Source  remains unclear.  Will send flu swab.  Lactate elevated and has bandemia. Plan admit for obs. Dr. Karleen Hampshire (Hospitalist) to admit.    Serita Grit, MD 02/23/15 [phone]

## 2015-02-23 NOTE — ED Notes (Signed)
From home; woke up this morning with weakness and chills. Felt normal yesterday. 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 Denies cough. Denies urinary symptoms. Denies pain. Temp 103.5 in route - given 1000mg  Tylenol.

## 2015-02-23 NOTE — Progress Notes (Signed)
ANTIBIOTIC CONSULT NOTE - INITIAL  Pharmacy Consult for vancomycin and Zosyn Indication: rule out sepsis  No Known Allergies  Patient Measurements: Height: 5\' 8"  (172.7 cm) Weight: 162 lb 6 oz (73.653 kg) IBW/kg (Calculated) : 68.4   Vital Signs: Temp: 102.6 F (39.2 C) (04/15 1510) Temp Source: Rectal (04/15 1510) BP: 127/54 mmHg (04/15 1630) Pulse Rate: 73 (04/15 1630) Intake/Output from previous day:   Intake/Output from this shift:    Labs:  Recent Labs  02/23/15 1534  WBC 9.7  HGB 13.6  PLT 172  CREATININE 1.39*   Estimated Creatinine Clearance: 44.4 mL/min (by C-G formula based on Cr of 1.39). No results for input(s): VANCOTROUGH, VANCOPEAK, VANCORANDOM, GENTTROUGH, GENTPEAK, GENTRANDOM, TOBRATROUGH, TOBRAPEAK, TOBRARND, AMIKACINPEAK, AMIKACINTROU, AMIKACIN in the last 72 hours.   Microbiology: No results found for this or any previous visit (from the past 720 hour(s)).  Medical History: Past Medical History  Diagnosis Date  . Hyperlipidemia   . Aortic stenosis, mild   . Crohn's disease   . Palpitations   . Macular degeneration   . Cataract   . Colonic hemorrhage 1964  . Heart murmur   . PAF (paroxysmal atrial fibrillation)     SEES DR. BRACKBILL  . PAC (premature atrial contraction)   . GERD (gastroesophageal reflux disease)   . Anemia     PO IRON     Medications:  Anti-infectives    Start     Dose/Rate Route Frequency Ordered Stop   02/23/15 2200  metroNIDAZOLE (FLAGYL) tablet 500 mg     500 mg Oral 3 times per day 02/23/15 1756     02/23/15 1545  vancomycin (VANCOCIN) 1,500 mg in sodium chloride 0.9 % 500 mL IVPB     1,500 mg 250 mL/hr over 120 Minutes Intravenous  Once 02/23/15 1536     02/23/15 1545  piperacillin-tazobactam (ZOSYN) IVPB 3.375 g     3.375 g 100 mL/hr over 30 Minutes Intravenous  Once 02/23/15 1536 02/23/15 1712     Assessment: 73 yoM with Crohn's on Humira presents with fever, weakness, and chills. Due to high fevers  and immunosuppression, MD to start empiric antibiotics. Pharmacy consulted to dose vancomycin and Zosyn for SIRS. Tmax 102.6, WBC 9.7, LA 3.66, SCr 1.39 (BL ~1.0 in 2014), CrCl 40-45 ml/min.  4/15 UCx >> 4/15 BCx2 >> 4/15 C diff >>  Vanc 4/15 >> Zosyn 4/15 >> Flagyl 4/15 >>  Goal of Therapy:  Vancomycin trough level 15-20 mcg/ml  Plan:  Vancomycin 1500 mg IV load, then 750 mg IV q12h Zosyn 3.375 g IV load, then 3.375 g IV q8h Flagyl 500 mg po q8h VTss if clinically indicated Monitor renal function Follow up C&S, clinical progress  Jeanelle Malling 02/23/2015,6:15 PM

## 2015-02-23 NOTE — ED Notes (Signed)
Transporting patient to new room assignment. 

## 2015-02-23 NOTE — ED Notes (Signed)
Cg-4 reported to [facility] and Dr. Doy Mince

## 2015-02-23 NOTE — ED Notes (Signed)
Phlebotomy obtaining second set of cultures at this time.

## 2015-02-23 NOTE — ED Notes (Signed)
Attempted report 

## 2015-02-24 ENCOUNTER — Inpatient Hospital Stay (HOSPITAL_COMMUNITY): Payer: PPO

## 2015-02-24 DIAGNOSIS — H3530 Unspecified macular degeneration: Secondary | ICD-10-CM | POA: Diagnosis present

## 2015-02-24 DIAGNOSIS — Z961 Presence of intraocular lens: Secondary | ICD-10-CM | POA: Diagnosis present

## 2015-02-24 DIAGNOSIS — Z932 Ileostomy status: Secondary | ICD-10-CM | POA: Diagnosis not present

## 2015-02-24 DIAGNOSIS — K219 Gastro-esophageal reflux disease without esophagitis: Secondary | ICD-10-CM | POA: Diagnosis present

## 2015-02-24 DIAGNOSIS — Z9842 Cataract extraction status, left eye: Secondary | ICD-10-CM | POA: Diagnosis not present

## 2015-02-24 DIAGNOSIS — Z87891 Personal history of nicotine dependence: Secondary | ICD-10-CM | POA: Diagnosis not present

## 2015-02-24 DIAGNOSIS — Z9049 Acquired absence of other specified parts of digestive tract: Secondary | ICD-10-CM | POA: Diagnosis present

## 2015-02-24 DIAGNOSIS — E860 Dehydration: Secondary | ICD-10-CM | POA: Diagnosis present

## 2015-02-24 DIAGNOSIS — K529 Noninfective gastroenteritis and colitis, unspecified: Secondary | ICD-10-CM | POA: Diagnosis present

## 2015-02-24 DIAGNOSIS — E876 Hypokalemia: Secondary | ICD-10-CM | POA: Diagnosis present

## 2015-02-24 DIAGNOSIS — I480 Paroxysmal atrial fibrillation: Secondary | ICD-10-CM | POA: Diagnosis present

## 2015-02-24 DIAGNOSIS — B349 Viral infection, unspecified: Secondary | ICD-10-CM | POA: Diagnosis present

## 2015-02-24 DIAGNOSIS — R509 Fever, unspecified: Secondary | ICD-10-CM | POA: Diagnosis present

## 2015-02-24 DIAGNOSIS — N179 Acute kidney failure, unspecified: Secondary | ICD-10-CM | POA: Diagnosis present

## 2015-02-24 DIAGNOSIS — A419 Sepsis, unspecified organism: Secondary | ICD-10-CM | POA: Diagnosis not present

## 2015-02-24 DIAGNOSIS — Z9841 Cataract extraction status, right eye: Secondary | ICD-10-CM | POA: Diagnosis not present

## 2015-02-24 DIAGNOSIS — I350 Nonrheumatic aortic (valve) stenosis: Secondary | ICD-10-CM | POA: Diagnosis present

## 2015-02-24 DIAGNOSIS — K5090 Crohn's disease, unspecified, without complications: Secondary | ICD-10-CM | POA: Diagnosis present

## 2015-02-24 DIAGNOSIS — Z7982 Long term (current) use of aspirin: Secondary | ICD-10-CM | POA: Diagnosis not present

## 2015-02-24 DIAGNOSIS — E785 Hyperlipidemia, unspecified: Secondary | ICD-10-CM | POA: Diagnosis present

## 2015-02-24 LAB — CBC
HCT: 36.8 % — ABNORMAL LOW (ref 39.0–52.0)
Hemoglobin: 12.3 g/dL — ABNORMAL LOW (ref 13.0–17.0)
MCH: 32.1 pg (ref 26.0–34.0)
MCHC: 33.4 g/dL (ref 30.0–36.0)
MCV: 96.1 fL (ref 78.0–100.0)
PLATELETS: 158 10*3/uL (ref 150–400)
RBC: 3.83 MIL/uL — AB (ref 4.22–5.81)
RDW: 13.9 % (ref 11.5–15.5)
WBC: 8.1 10*3/uL (ref 4.0–10.5)

## 2015-02-24 LAB — COMPREHENSIVE METABOLIC PANEL
ALT: 14 U/L (ref 0–53)
AST: 23 U/L (ref 0–37)
Albumin: 2.9 g/dL — ABNORMAL LOW (ref 3.5–5.2)
Alkaline Phosphatase: 41 U/L (ref 39–117)
Anion gap: 11 (ref 5–15)
BUN: 11 mg/dL (ref 6–23)
CALCIUM: 8 mg/dL — AB (ref 8.4–10.5)
CO2: 19 mmol/L (ref 19–32)
Chloride: 107 mmol/L (ref 96–112)
Creatinine, Ser: 1.38 mg/dL — ABNORMAL HIGH (ref 0.50–1.35)
GFR calc non Af Amer: 48 mL/min — ABNORMAL LOW (ref >90)
GFR, EST AFRICAN AMERICAN: 56 mL/min — AB (ref >90)
Glucose, Bld: 92 mg/dL (ref 70–99)
Potassium: 3.8 mmol/L (ref 3.5–5.1)
Sodium: 137 mmol/L (ref 135–145)
Total Bilirubin: 0.7 mg/dL (ref 0.3–1.2)
Total Protein: 5.2 g/dL — ABNORMAL LOW (ref 6.0–8.3)

## 2015-02-24 LAB — INFLUENZA PANEL BY PCR (TYPE A & B)
H1N1 flu by pcr: NOT DETECTED
INFLAPCR: NEGATIVE
INFLBPCR: NEGATIVE

## 2015-02-24 LAB — CLOSTRIDIUM DIFFICILE BY PCR: Toxigenic C. Difficile by PCR: NEGATIVE

## 2015-02-24 MED ORDER — ENOXAPARIN SODIUM 40 MG/0.4ML ~~LOC~~ SOLN
40.0000 mg | SUBCUTANEOUS | Status: DC
  Administered 2015-02-24: 40 mg via SUBCUTANEOUS
  Filled 2015-02-24: qty 0.4

## 2015-02-24 MED ORDER — IOHEXOL 300 MG/ML  SOLN
100.0000 mL | Freq: Once | INTRAMUSCULAR | Status: AC | PRN
  Administered 2015-02-24: 100 mL via INTRAVENOUS

## 2015-02-24 MED ORDER — IOHEXOL 300 MG/ML  SOLN
25.0000 mL | INTRAMUSCULAR | Status: AC
  Administered 2015-02-24 (×2): 25 mL via ORAL

## 2015-02-24 NOTE — Progress Notes (Signed)
UR completed 

## 2015-02-24 NOTE — Progress Notes (Addendum)
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 Difficile negative  Acute renal failure: probably from dehdyration, , continue IV hydration   Hypokalemia: repleted  Code Status: full Family Communication: family updated about patient's clinical progress Disposition Plan:  Possibly discharge home tomorrow    Brief narrative: 74 y.o. male with h/o crohn's disease, follows at [facility] , comes in for chills and a fever of 103. He denies any sob, chest pain, upper respiratory symptoms, nausea or vomiting. He reports 3 loose BM today and that is normal for him. He denies any travel or sick contacts, he denies using any antibiotics recently. On arrival to ED, he was found to be febrile, and labs reveal elevated lactic acid, Elevated neutrophil count, hypokalemia and in acute renal failure. He was referred to admission to medical service for observation overnight.    Consultants:  none  Procedures:  none  Antibiotics: Vancomycin and Zosyn  HPI/Subjective: Patient states is normal for him to have diarrhea several times a day Febrile yesterday, continues to have chills  Objective: Filed Vitals:   02/23/15 1900 02/23/15 1924 02/23/15 2012 02/24/15 0600  BP: 133/55 133/55 117/52 119/53  Pulse: 58 64 74 75  Temp:   98.6 F (37 C) 92.2 F (33.4 C)  TempSrc:   Oral Oral  Resp: 15 19 18 21   Height:      Weight:      SpO2: 99% 99% 99% 97%    Intake/Output Summary (Last 24 hours) at 02/24/15 1107 Last data filed at 02/24/15  0934  Gross per 24 hour  Intake    940 ml  Output      5 ml  Net    935 ml    Exam:  General: No acute respiratory distress Lungs: Clear to auscultation bilaterally without wheezes or crackles Cardiovascular: Regular rate and rhythm without murmur gallop or rub normal S1 and S2 Abdomen: Nontender, nondistended, soft, bowel sounds positive, no rebound, no ascites, no appreciable mass Extremities: No significant cyanosis, clubbing, or edema bilateral lower extremities      Data Reviewed: Basic Metabolic Panel:  Recent Labs Lab 02/23/15 1534 02/24/15 0440  NA 135 137  K 3.3* 3.8  CL 104 107  CO2 19 19  GLUCOSE 139* 92  BUN 13 11  CREATININE 1.39* 1.38*  CALCIUM 9.0 8.0*    Liver Function Tests:  Recent Labs Lab 02/23/15 1534 02/24/15 0440  AST 28 23  ALT 16 14  ALKPHOS 52 41  BILITOT 0.9 0.7  PROT 6.1 5.2*  ALBUMIN 3.6 2.9*   No results for input(s): LIPASE, AMYLASE in the last 168 hours. No results for input(s): AMMONIA in the last 168 hours.  CBC:  Recent Labs Lab 02/23/15 1534 02/24/15 0440  WBC 9.7 8.1  NEUTROABS 9.0*  --   HGB 13.6 12.3*  HCT 39.4 36.8*  MCV 93.8 96.1  PLT 172 158    Cardiac Enzymes: No results for input(s): CKTOTAL, CKMB, CKMBINDEX, TROPONINI in the last 168 hours. BNP (last 3 results) No results for input(s): BNP in the last 8760 hours.  ProBNP (last 3 results)  No results for input(s): PROBNP in the last 8760 hours.    CBG: No results for input(s): GLUCAP in the last 168 hours.  Recent Results (from the past 240 hour(s))  Blood Culture (routine x 2)     Status: None (Preliminary result)   Collection Time: 02/23/15  3:34 PM  Result Value Ref Range Status   Specimen Description BLOOD ARM RIGHT  Final   Special Requests BOTTLES DRAWN AEROBIC AND ANAEROBIC 5CC  Final   Culture   Final           BLOOD CULTURE RECEIVED NO GROWTH TO DATE CULTURE WILL BE HELD FOR 5 DAYS BEFORE ISSUING A FINAL NEGATIVE REPORT Performed  at [facility]    Report Status PENDING  Incomplete  Clostridium Difficile by PCR     Status: None   Collection Time: 02/23/15 10:43 PM  Result Value Ref Range Status   C difficile by pcr NEGATIVE NEGATIVE Final     Studies: Dg Chest Port 1 View  02/23/2015   CLINICAL DATA:  From home; woke up this morning with weakness and chills. Felt normal yesterday. Denies N/V, reports his diarrhea is normal for him. Pt also has a high fever  EXAM: PORTABLE CHEST - 1 VIEW  COMPARISON:  12/01/2012  FINDINGS: Cardiac silhouette normal in size and configuration. Aorta is slightly uncoiled. No mediastinal or hilar masses or evidence of adenopathy.  Clear lungs.  No pleural effusion or pneumothorax.  Bony thorax is intact.  IMPRESSION: No active disease.   Electronically Signed   By: Lajean Manes M.D.   On: 02/23/2015 15:55    Scheduled Meds: . azaTHIOprine  50 mg Oral Daily  . enoxaparin (LOVENOX) injection  40 mg Subcutaneous Q24H  . metroNIDAZOLE  500 mg Oral 3 times per day  . piperacillin-tazobactam (ZOSYN)  IV  3.375 g Intravenous Q8H  . vancomycin  750 mg Intravenous Q12H   Continuous Infusions: . sodium chloride 150 mL/hr at 02/24/15 6237    Active Problems:   Sepsis   SIRS (systemic inflammatory response syndrome)    Time spent: 40 minutes   [facility] Pager [phone]. If 7PM-7AM, please contact night-coverage at [email or web address], password [facility] 02/24/2015, 11:07 AM

## 2015-02-25 LAB — URINE CULTURE
CULTURE: NO GROWTH
Colony Count: NO GROWTH

## 2015-02-25 NOTE — Progress Notes (Signed)
DC instructions reviewed and copy given.  FU appts reviewed and pt understands.  No new Rx.

## 2015-02-25 NOTE — Discharge Summary (Addendum)
Physician Discharge Summary  GEORGI NAVARRETE MRN: [id number] DOB/AGE: 76-Dec-1942 76 y.o.  PCP: Donnajean Lopes, MD   Admit date: 02/23/2015 Discharge date: 02/25/2015  Discharge Diagnoses:   Active Problems:   Sepsis Viral syndrome Chronic diarrhea Hypokalemia  Follow-up recommendations Follow-up with PCP in 5-7 days Follow-up CBC, CMP in 1 week     Medication List    TAKE these medications        aspirin 81 MG EC tablet  Take 81 mg by mouth every evening.     azaTHIOprine 50 MG tablet  Commonly known as:  IMURAN  Take 50 mg by mouth daily.     ergocalciferol 50000 UNITS capsule  Commonly known as:  VITAMIN D2  Take 50,000 Units by mouth once a week. Take on Mondays     IRON PO  Take 2 tablets by mouth daily. Iron 45m     LOFIBRA 160 MG tablet  Generic drug:  fenofibrate  Take 160 mg by mouth daily.     loperamide 2 MG capsule  Commonly known as:  IMODIUM  Take 2 mg by mouth daily as needed for diarrhea or loose stools.     metoprolol 50 MG tablet  Commonly known as:  LOPRESSOR  Take 25 mg by mouth 2 (two) times daily.     multivitamin tablet  Take 1 tablet by mouth daily. centrum silver.     multivitamin-lutein Caps capsule  Take 2 capsules by mouth daily. For senior     OVER THE COUNTER MEDICATION  Take 10 mLs by mouth 3 (three) times daily. Medication: Fiber powder        Discharge Condition: Stable   Disposition: 01-Home or Self Care   Consults:    Significant Diagnostic Studies: Ct Chest W Contrast  02/24/2015   CLINICAL DATA:  Fever since yesterday. Status post surgery for Crohn's disease in 2013 with ostomy reversal in 2014.  EXAM: CT CHEST, ABDOMEN, AND PELVIS WITH CONTRAST  TECHNIQUE: Multidetector CT imaging of the chest, abdomen and pelvis was performed following the standard protocol during bolus administration of intravenous contrast.  CONTRAST:  1025mOMNIPAQUE IOHEXOL 300 MG/ML  SOLN  COMPARISON:  Portable chest  obtained yesterday. Abdomen and pelvis CT dated 04/09/2012.  FINDINGS: CT CHEST FINDINGS  Sclerosis and Schmorl's node formation on both sides of the T7-8 disc space. Mild anterior spur formation at that level and other levels. Minimal dependent atelectasis at both lung bases. No airspace consolidation concerning for pneumonia. No lung nodules or enlarged lymph nodes.  CT ABDOMEN AND PELVIS FINDINGS  Diffuse low density of the liver relative to the spleen. Stable left lobe liver cyst. Normal appearing spleen, pancreas, gallbladder, adrenal glands,, urinary bladder and prostate gland. Bilateral renal cysts. Stable surgical clips in the region of the diaphragmatic hiatus. No gastrointestinal abnormalities or enlarged lymph nodes. Mild lumbar spine degenerative changes.  IMPRESSION: 1. No acute abnormality. 2. Diffuse hepatic steatosis. 3. 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   Electronically Signed   By: StClaudie Revering.D.   On: 02/24/2015 16:10   Ct Abdomen Pelvis W Contrast  02/24/2015   CLINICAL DATA:  Fever since yesterday. Status post surgery for Crohn's disease in 2013 with ostomy reversal in 2014.  EXAM: CT CHEST, ABDOMEN, AND PELVIS WITH CONTRAST  TECHNIQUE: Multidetector CT imaging of the chest, abdomen and pelvis was performed following the standard protocol during bolus administration of intravenous contrast.  CONTRAST:  10098mMNIPAQUE IOHEXOL 300 MG/ML  SOLN  COMPARISON:  Portable chest obtained yesterday. Abdomen and pelvis CT dated 04/09/2012.  FINDINGS: CT CHEST FINDINGS  Sclerosis and Schmorl's node formation on both sides of the T7-8 disc space. Mild anterior spur formation at that level and other levels. Minimal dependent atelectasis at both lung bases. No airspace consolidation concerning for pneumonia. No lung nodules or enlarged lymph nodes.  CT ABDOMEN AND PELVIS FINDINGS  Diffuse low density of the liver relative to the spleen. Stable left lobe liver cyst. Normal appearing spleen,  pancreas, gallbladder, adrenal glands,, urinary bladder and prostate gland. Bilateral renal cysts. Stable surgical clips in the region of the diaphragmatic hiatus. No gastrointestinal abnormalities or enlarged lymph nodes. Mild lumbar spine degenerative changes.  IMPRESSION: 1. No acute abnormality. 2. Diffuse hepatic steatosis. 3. 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   Electronically Signed   By: Claudie Revering M.D.   On: 02/24/2015 16:10   Dg Chest Port 1 View  02/23/2015   CLINICAL DATA:  From home; woke up this morning with weakness and chills. Felt normal yesterday. Denies N/V, reports his diarrhea is normal for him. Pt also has a high fever  EXAM: PORTABLE CHEST - 1 VIEW  COMPARISON:  12/01/2012  FINDINGS: Cardiac silhouette normal in size and configuration. Aorta is slightly uncoiled. No mediastinal or hilar masses or evidence of adenopathy.  Clear lungs.  No pleural effusion or pneumothorax.  Bony thorax is intact.  IMPRESSION: No active disease.   Electronically Signed   By: Lajean Manes M.D.   On: 02/23/2015 15:55      Microbiology: Recent Results (from the past 240 hour(s))  Blood Culture (routine x 2)     Status: None (Preliminary result)   Collection Time: 02/23/15  3:34 PM  Result Value Ref Range Status   Specimen Description BLOOD ARM RIGHT  Final   Special Requests BOTTLES DRAWN AEROBIC AND ANAEROBIC 5CC  Final   Culture   Final           BLOOD CULTURE RECEIVED NO GROWTH TO DATE CULTURE WILL BE HELD FOR 5 DAYS BEFORE ISSUING A FINAL NEGATIVE REPORT Performed at [facility]    Report Status PENDING  Incomplete  Blood Culture (routine x 2)     Status: None (Preliminary result)   Collection Time: 02/23/15  4:44 PM  Result Value Ref Range Status   Specimen Description BLOOD ARM LEFT  Final   Special Requests BOTTLES DRAWN AEROBIC AND ANAEROBIC 5CC  Final   Culture   Final           BLOOD CULTURE RECEIVED NO GROWTH TO DATE CULTURE WILL BE HELD FOR 5 DAYS BEFORE ISSUING  A FINAL NEGATIVE REPORT Performed at [facility]    Report Status PENDING  Incomplete  Clostridium Difficile by PCR     Status: None   Collection Time: 02/23/15 10:43 PM  Result Value Ref Range Status   C difficile by pcr NEGATIVE NEGATIVE Final  Culture, blood (routine x 2)     Status: None (Preliminary result)   Collection Time: 02/24/15 10:05 AM  Result Value Ref Range Status   Specimen Description BLOOD RIGHT HAND  Final   Special Requests BOTTLES DRAWN AEROBIC AND ANAEROBIC 10CC  Final   Culture   Final           BLOOD CULTURE RECEIVED NO GROWTH TO DATE CULTURE WILL BE HELD FOR 5 DAYS BEFORE ISSUING A FINAL NEGATIVE REPORT Performed at [facility]    Report  Status PENDING  Incomplete  Culture, blood (routine x 2)     Status: None (Preliminary result)   Collection Time: 02/24/15 10:12 AM  Result Value Ref Range Status   Specimen Description BLOOD RIGHT ARM  Final   Special Requests BOTTLES DRAWN AEROBIC ONLY 10CC  Final   Culture   Final           BLOOD CULTURE RECEIVED NO GROWTH TO DATE CULTURE WILL BE HELD FOR 5 DAYS BEFORE ISSUING A FINAL NEGATIVE REPORT Performed at [facility]    Report Status PENDING  Incomplete     Labs: Results for orders placed or performed during the hospital encounter of 02/23/15 (from the past 48 hour(s))  Blood Culture (routine x 2)     Status: None (Preliminary result)   Collection Time: 02/23/15  3:34 PM  Result Value Ref Range   Specimen Description BLOOD ARM RIGHT    Special Requests BOTTLES DRAWN AEROBIC AND ANAEROBIC 5CC    Culture             BLOOD CULTURE RECEIVED NO GROWTH TO DATE CULTURE WILL BE HELD FOR 5 DAYS BEFORE ISSUING A FINAL NEGATIVE REPORT Performed at [facility]    Report Status PENDING   CBC WITH DIFFERENTIAL     Status: Abnormal   Collection Time: 02/23/15  3:34 PM  Result Value Ref Range   WBC 9.7 4.0 - 10.5 K/uL   RBC 4.20 (L) 4.22 - 5.81 MIL/uL   Hemoglobin 13.6 13.0 - 17.0  g/dL   HCT 39.4 39.0 - 52.0 %   MCV 93.8 78.0 - 100.0 fL   MCH 32.4 26.0 - 34.0 pg   MCHC 34.5 30.0 - 36.0 g/dL   RDW 13.3 11.5 - 15.5 %   Platelets 172 150 - 400 K/uL   Neutrophils Relative % 93 (H) 43 - 77 %   Neutro Abs 9.0 (H) 1.7 - 7.7 K/uL   Lymphocytes Relative 2 (L) 12 - 46 %   Lymphs Abs 0.2 (L) 0.7 - 4.0 K/uL   Monocytes Relative 4 3 - 12 %   Monocytes Absolute 0.4 0.1 - 1.0 K/uL   Eosinophils Relative 1 0 - 5 %   Eosinophils Absolute 0.1 0.0 - 0.7 K/uL   Basophils Relative 0 0 - 1 %   Basophils Absolute 0.0 0.0 - 0.1 K/uL  Comprehensive metabolic panel     Status: Abnormal   Collection Time: 02/23/15  3:34 PM  Result Value Ref Range   Sodium 135 135 - 145 mmol/L   Potassium 3.3 (L) 3.5 - 5.1 mmol/L   Chloride 104 96 - 112 mmol/L   CO2 19 19 - 32 mmol/L   Glucose, Bld 139 (H) 70 - 99 mg/dL   BUN 13 6 - 23 mg/dL   Creatinine, Ser 1.39 (H) 0.50 - 1.35 mg/dL   Calcium 9.0 8.4 - 10.5 mg/dL   Total Protein 6.1 6.0 - 8.3 g/dL   Albumin 3.6 3.5 - 5.2 g/dL   AST 28 0 - 37 U/L   ALT 16 0 - 53 U/L   Alkaline Phosphatase 52 39 - 117 U/L   Total Bilirubin 0.9 0.3 - 1.2 mg/dL   GFR calc non Af Amer 48 (L) >90 mL/min   GFR calc Af Amer 56 (L) >90 mL/min    Comment: (NOTE) The eGFR has been calculated using the CKD EPI equation. This calculation has not been validated in all clinical situations. eGFR's persistently <90 mL/min  signify possible Chronic Kidney Disease.    Anion gap 12 5 - 15  I-Stat CG4 Lactic Acid, ED (not at [facility])     Status: Abnormal   Collection Time: 02/23/15  3:58 PM  Result Value Ref Range   Lactic Acid, Venous 3.66 (HH) 0.5 - 2.0 mmol/L   Comment NOTIFIED PHYSICIAN   Urinalysis, Routine w reflex microscopic     Status: Abnormal   Collection Time: 02/23/15  4:30 PM  Result Value Ref Range   Color, Urine YELLOW YELLOW   APPearance CLEAR CLEAR   Specific Gravity, Urine 1.013 1.005 - 1.030   pH 8.5 (H) 5.0 - 8.0   Glucose, UA NEGATIVE NEGATIVE mg/dL    Hgb urine dipstick NEGATIVE NEGATIVE   Bilirubin Urine NEGATIVE NEGATIVE   Ketones, ur NEGATIVE NEGATIVE mg/dL   Protein, ur NEGATIVE NEGATIVE mg/dL   Urobilinogen, UA 0.2 0.0 - 1.0 mg/dL   Nitrite NEGATIVE NEGATIVE   Leukocytes, UA NEGATIVE NEGATIVE    Comment: MICROSCOPIC NOT DONE ON URINES WITH NEGATIVE PROTEIN, BLOOD, LEUKOCYTES, NITRITE, OR GLUCOSE <1000 mg/dL.  Blood Culture (routine x 2)     Status: None (Preliminary result)   Collection Time: 02/23/15  4:44 PM  Result Value Ref Range   Specimen Description BLOOD ARM LEFT    Special Requests BOTTLES DRAWN AEROBIC AND ANAEROBIC 5CC    Culture             BLOOD CULTURE RECEIVED NO GROWTH TO DATE CULTURE WILL BE HELD FOR 5 DAYS BEFORE ISSUING A FINAL NEGATIVE REPORT Performed at [facility]    Report Status PENDING   Influenza panel by PCR (type A & B, H1N1)     Status: None   Collection Time: 02/23/15  5:29 PM  Result Value Ref Range   Influenza A By PCR NEGATIVE NEGATIVE   Influenza B By PCR NEGATIVE NEGATIVE   H1N1 flu by pcr NOT DETECTED NOT DETECTED    Comment:        The Xpert Flu assay (FDA approved for nasal aspirates or washes and nasopharyngeal swab specimens), is intended as an aid in the diagnosis of influenza and should not be used as a sole basis for treatment.   I-Stat CG4 Lactic Acid, ED (not at [facility])     Status: None   Collection Time: 02/23/15  6:58 PM  Result Value Ref Range   Lactic Acid, Venous 1.78 0.5 - 2.0 mmol/L  Clostridium Difficile by PCR     Status: None   Collection Time: 02/23/15 10:43 PM  Result Value Ref Range   C difficile by pcr NEGATIVE NEGATIVE  Comprehensive metabolic panel     Status: Abnormal   Collection Time: 02/24/15  4:40 AM  Result Value Ref Range   Sodium 137 135 - 145 mmol/L   Potassium 3.8 3.5 - 5.1 mmol/L   Chloride 107 96 - 112 mmol/L   CO2 19 19 - 32 mmol/L   Glucose, Bld 92 70 - 99 mg/dL   BUN 11 6 - 23 mg/dL   Creatinine, Ser 1.38 (H) 0.50 - 1.35  mg/dL   Calcium 8.0 (L) 8.4 - 10.5 mg/dL   Total Protein 5.2 (L) 6.0 - 8.3 g/dL   Albumin 2.9 (L) 3.5 - 5.2 g/dL   AST 23 0 - 37 U/L   ALT 14 0 - 53 U/L   Alkaline Phosphatase 41 39 - 117 U/L   Total Bilirubin 0.7 0.3 - 1.2 mg/dL   GFR calc  non Af Amer 48 (L) >90 mL/min   GFR calc Af Amer 56 (L) >90 mL/min    Comment: (NOTE) The eGFR has been calculated using the CKD EPI equation. This calculation has not been validated in all clinical situations. eGFR's persistently <90 mL/min signify possible Chronic Kidney Disease.    Anion gap 11 5 - 15  CBC     Status: Abnormal   Collection Time: 02/24/15  4:40 AM  Result Value Ref Range   WBC 8.1 4.0 - 10.5 K/uL   RBC 3.83 (L) 4.22 - 5.81 MIL/uL   Hemoglobin 12.3 (L) 13.0 - 17.0 g/dL   HCT 36.8 (L) 39.0 - 52.0 %   MCV 96.1 78.0 - 100.0 fL   MCH 32.1 26.0 - 34.0 pg   MCHC 33.4 30.0 - 36.0 g/dL   RDW 13.9 11.5 - 15.5 %   Platelets 158 150 - 400 K/uL  Culture, blood (routine x 2)     Status: None (Preliminary result)   Collection Time: 02/24/15 10:05 AM  Result Value Ref Range   Specimen Description BLOOD RIGHT HAND    Special Requests BOTTLES DRAWN AEROBIC AND ANAEROBIC 10CC    Culture             BLOOD CULTURE RECEIVED NO GROWTH TO DATE CULTURE WILL BE HELD FOR 5 DAYS BEFORE ISSUING A FINAL NEGATIVE REPORT Performed at [facility]    Report Status PENDING   Culture, blood (routine x 2)     Status: None (Preliminary result)   Collection Time: 02/24/15 10:12 AM  Result Value Ref Range   Specimen Description BLOOD RIGHT ARM    Special Requests BOTTLES DRAWN AEROBIC ONLY 10CC    Culture             BLOOD CULTURE RECEIVED NO GROWTH TO DATE CULTURE WILL BE HELD FOR 5 DAYS BEFORE ISSUING A FINAL NEGATIVE REPORT Performed at [facility]    Report Status PENDING      HPI :74 y.o. male with h/o crohn's disease, follows at [facility] , comes in for chills and a fever of 103. He denies any sob, chest pain, upper  respiratory symptoms, nausea or vomiting. He reports 3 loose BM today and that is normal for him. He denies any travel or sick contacts, he denies using any antibiotics recently. On arrival to ED, he was found to be febrile, and labs reveal elevated lactic acid, Elevated neutrophil count, hypokalemia and in acute renal failure. He was referred to admission to medical service for observation overnight.   HOSPITAL COURSE: * Probable sepsis Culture data negative thus far, flu PCR negative Initially started on vancomycin and Zosyn for probable sepsis  broad-spectrum antibiotics discontinued Flu PCR negative CT scan chest abdomen pelvis negative for abscess/possible infection Most likely this is self-limiting  viral syndrome, fever resolved  Diarrhea C. Difficile negative  Acute renal failure: probably from dehdyration, , hydrated with IV fluids Follow-up CMP in 5-7 days   Hypokalemia: repleted    Discharge Exam: Blood pressure 140/76, pulse 60, temperature 97.8 F (36.6 C), temperature source Oral, resp. rate 18, height '5\' 8"'  (1.727 m), weight 73.653 kg (162 lb 6 oz), SpO2 99 %. General: No acute respiratory distress Lungs: Clear to auscultation bilaterally without wheezes or crackles Cardiovascular: Regular rate and rhythm without murmur gallop or rub normal S1 and S2 Abdomen: Nontender, nondistended, soft, bowel sounds positive, no rebound, no ascites, no appreciable mass Extremities: No significant cyanosis, clubbing, or edema  bilateral lower extremities         Discharge Instructions    Diet - low sodium heart healthy    Complete by:  As directed      Diet - low sodium heart healthy    Complete by:  As directed      Increase activity slowly    Complete by:  As directed      Increase activity slowly    Complete by:  As directed            Follow-up Information    Follow up with Donnajean Lopes, MD. Schedule an appointment as soon as possible for a visit in 3  days.   Specialty:  Internal Medicine   Contact information:   [place] [address] [phone]       Signed: Reyne Dumas 02/25/2015, 10:26 AM

## 2015-02-28 DIAGNOSIS — A419 Sepsis, unspecified organism: Secondary | ICD-10-CM | POA: Insufficient documentation

## 2015-03-01 LAB — CULTURE, BLOOD (ROUTINE X 2): CULTURE: NO GROWTH

## 2015-03-02 LAB — CULTURE, BLOOD (ROUTINE X 2)
CULTURE: NO GROWTH
Culture: NO GROWTH
Culture: NO GROWTH

## 2015-05-07 ENCOUNTER — Other Ambulatory Visit: Payer: Self-pay

## 2015-10-22 ENCOUNTER — Ambulatory Visit (INDEPENDENT_AMBULATORY_CARE_PROVIDER_SITE_OTHER): Payer: PPO | Admitting: Cardiology

## 2015-10-22 ENCOUNTER — Encounter: Payer: Self-pay | Admitting: Cardiology

## 2015-10-22 VITALS — BP 146/72 | HR 52 | Ht 69.0 in | Wt 166.2 lb

## 2015-10-22 DIAGNOSIS — I480 Paroxysmal atrial fibrillation: Secondary | ICD-10-CM

## 2015-10-22 NOTE — Progress Notes (Signed)
Cardiology Office Note   Date:  10/22/2015   ID:  Nicholas [name], Nicholas [name] 10/02/1941, MRN [id number]  PCP:  Donnajean Lopes, MD  Cardiologist: Darlin Coco MD  Chief Complaint  Patient presents with  . Aortic Stenosis      History of Present Illness: Nicholas [name] is a 74 y.o. male who presents for follow-up cardiology visit.  [name] He has a past history of mild aortic stenosis. He has a past history of paroxysmal atrial fibrillation.Since last visit 3 years ago he has not had any recurrent atrial fibrillation.  He checks his pulse when he does his blood pressures at home and it has shown steady rhythm.  The patient had colon surgery in 2014 by Dr. Dalbert Batman. Preoperatively he had an update of his echocardiogram on 11/12/12 which showed an ejection fraction of 60-65% and his aortic valve appeared to be normal. On 11/17/12 the patient underwent a treadmill Myoview stress test which showed an ejection fraction of 66% and no ischemia. While he was at [facility] recovering from surgery he did have an episode of brief rapid irregular heart rhythm at a heart rate in the 170s. He went back into normal rhythm spontaneously before a 12-lead EKG could be obtained.  He states that the colon surgery was very successful and he is now eating a regular diet and his weight has been stable.  He feels better than he has felt in years.  He has had several episodes of unusually severe fatigue after doing yard work last summer.  The fatigue was not associated with any irregular pulse.  The patient has not been experiencing any chest discomfort or shortness of breath. Past Medical History  Diagnosis Date  . Hyperlipidemia   . Aortic stenosis, mild   . Crohn's disease ([place])   . Palpitations   . Macular degeneration   . Cataract   . Colonic hemorrhage 1964  . Heart murmur   . PAF (paroxysmal atrial fibrillation) ([place])     SEES DR. Makendra Vigeant  . PAC (premature atrial contraction)   . GERD  (gastroesophageal reflux disease)   . Anemia     PO IRON     Past Surgical History  Procedure Laterality Date  . Tonsillectomy    . Stomach surgery  1959    ulcer  . US echocardiography  05/20/2010    EF 55-60%  . US echocardiography  10/23/2005    EF 55-60%  . Cardiovascular stress test  11/29/2003    EF 64%  . Appendectomy    . Bladder repair  1964    intestine leaks/bladder repair  . Laparotomy  04/16/2012    Procedure: EXPLORATORY LAPAROTOMY;  Surgeon: Adin Hector, MD;  Location: [facility];  Service: General;  Laterality: N/A;  . Ileostomy  04/16/2012    Procedure: ILEOSTOMY;  Surgeon: Adin Hector, MD;  Location: [facility];  Service: General;  Laterality: N/A;  . Cataract extraction w/ intraocular lens  implant, bilateral      2013  . Ileostomy closure  12/07/2012  . Ileostomy closure  12/07/2012    Procedure: ILEOSTOMY TAKEDOWN;  Surgeon: Adin Hector, MD;  Location: [facility];  Service: General;  Laterality: N/A;  . Lysis of adhesion  12/07/2012    Procedure: LYSIS OF ADHESION;  Surgeon: Adin Hector, MD;  Location: [facility];  Service: General;  Laterality: N/A;  . Proctoscopy  12/07/2012    Procedure: PROCTOSCOPY;  Surgeon: Adin Hector, MD;  Location:  [facility];  Service: General;;  ILEO PROCTOSCOPY  . Cardiovascular stress test  11/17/2012    No evidence of ischemia; EF 66%; inferior wall attenuation favored to rep diaphragmatic attenuation     Current Outpatient Prescriptions  Medication Sig Dispense Refill  . aspirin 81 MG EC tablet Take 81 mg by mouth every evening.     [name] azaTHIOprine (IMURAN) 50 MG tablet Take 50 mg by mouth daily.    . ergocalciferol (VITAMIN D2) 50000 UNITS capsule Take 50,000 Units by mouth once a week. Take on Mondays    . fenofibrate (LOFIBRA) 160 MG tablet Take 160 mg by mouth daily.    . IRON PO Take 2 tablets by mouth daily. Iron 65mg     . loperamide (IMODIUM) 2 MG capsule Take 2 mg by mouth daily as needed for diarrhea or loose stools.    .  Methylcellulose, Laxative, 500 MG TABS Take 1 tablet by mouth daily as needed. constipation    . metoprolol (LOPRESSOR) 50 MG tablet Take 25 mg by mouth 2 (two) times daily.     . Multiple Vitamin (MULTI VITAMIN DAILY PO) Take 1 tablet by mouth daily.    . Multiple Vitamin (MULTIVITAMIN) tablet Take 1 tablet by mouth daily. centrum silver.    . Multiple Vitamins-Minerals (PRESERVISION AREDS 2 PO) Take 2 tablets by mouth daily.    . multivitamin-lutein (OCUVITE-LUTEIN) CAPS Take 2 capsules by mouth daily. For senior    . OVER THE COUNTER MEDICATION Take 10 mLs by mouth 3 (three) times daily. Medication: Fiber powder    . testosterone cypionate (DEPOTESTOSTERONE CYPIONATE) 200 MG/ML injection Inject 1 mL into the muscle every 21 ( twenty-one) days.  5  . Wheat Dextrin (BENEFIBER DRINK MIX PO) Take 1 Dose by mouth daily.     No current facility-administered medications for this visit.    Allergies:   Review of patient's allergies indicates no known allergies.    Social History:  The patient  reports that he quit smoking about 50 years ago. He has never used smokeless tobacco. He reports that he does not drink alcohol or use illicit drugs.   Family History:  The patient's family history includes Heart disease in his maternal grandfather and another family member. There is no history of Colon cancer.    ROS:  Please see the history of present illness.   Otherwise, review of systems are positive for .   All other systems are reviewed and negative.    PHYSICAL EXAM: VS:  BP 146/72 mmHg  Pulse 52  Ht 5\' 9"  (1.753 m)  Wt 166 lb 3.2 oz (75.388 kg)  BMI 24.53 kg/m2 , BMI Body mass index is 24.53 kg/(m^2). GEN: Well nourished, well developed, in no acute distress HEENT: normal Neck: no JVD, carotid bruits, or masses Cardiac: RRR; no murmurs, rubs, or gallops,no edema  Respiratory:  clear to auscultation bilaterally, normal work of breathing GI: soft, nontender, nondistended, + BS MS: no  deformity or atrophy Skin: warm and dry, no rash Neuro:  Strength and sensation are intact Psych: euthymic mood, full affect   EKG:  EKG is ordered today. The ekg ordered today demonstrates sinus bradycardia at 52 bpm.  Benign neural repolarization.  No ischemic changes.   Recent Labs: 02/24/2015: ALT 14; BUN 11; Creatinine, Ser 1.38*; Hemoglobin 12.3*; Platelets 158; Potassium 3.8; Sodium 137    Lipid Panel    Component Value Date/Time   CHOL 60 04/19/2012 0411   TRIG 103 04/19/2012 0411  Wt Readings from Last 3 Encounters:  10/22/15 166 lb 3.2 oz (75.388 kg)  02/23/15 162 lb 6 oz (73.653 kg)  12/31/13 158 lb 5 oz (71.81 kg)         ASSESSMENT AND PLAN:  1.  Remote history of paroxysmal atrial fibrillation.  No recurrence 2.  Very mild aortic stenosis 3.  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  The patient does not have concerns regarding medicines.  The following changes have been made:  no change  Labs/ tests ordered today include:   Orders Placed This Encounter  Procedures  . EKG 12-Lead   Disposition: The patient will continue current dose of metoprolol which she is tolerating well.  He will return here on a when necessary basis.  Continue medical follow-up with Dr. Janie Morning  Signed, Darlin Coco MD 10/22/2015 12:52 PM    [address], [place], [place]  [address] Phone: [phone]; Fax: [phone]

## 2015-10-22 NOTE — Patient Instructions (Signed)
Medication Instructions:  Your physician recommends that you continue on your current medications as directed. Please refer to the Current Medication list given to you today.  Labwork: none  Testing/Procedures: none  Follow-Up: As needed   If you need a refill on your cardiac medications before your next appointment, please call your pharmacy.  

## 2015-11-27 DIAGNOSIS — E291 Testicular hypofunction: Secondary | ICD-10-CM | POA: Diagnosis not present

## 2016-01-24 DIAGNOSIS — K50813 Crohn's disease of both small and large intestine with fistula: Secondary | ICD-10-CM | POA: Diagnosis not present

## 2016-01-24 DIAGNOSIS — K5090 Crohn's disease, unspecified, without complications: Secondary | ICD-10-CM | POA: Diagnosis not present

## 2016-01-24 DIAGNOSIS — Z79899 Other long term (current) drug therapy: Secondary | ICD-10-CM | POA: Diagnosis not present

## 2016-01-24 DIAGNOSIS — R911 Solitary pulmonary nodule: Secondary | ICD-10-CM | POA: Diagnosis not present

## 2016-01-29 DIAGNOSIS — C44229 Squamous cell carcinoma of skin of left ear and external auricular canal: Secondary | ICD-10-CM | POA: Diagnosis not present

## 2016-01-29 DIAGNOSIS — D485 Neoplasm of uncertain behavior of skin: Secondary | ICD-10-CM | POA: Diagnosis not present

## 2016-03-19 DIAGNOSIS — C44229 Squamous cell carcinoma of skin of left ear and external auricular canal: Secondary | ICD-10-CM | POA: Diagnosis not present

## 2016-04-21 DIAGNOSIS — K625 Hemorrhage of anus and rectum: Secondary | ICD-10-CM | POA: Insufficient documentation

## 2016-04-21 DIAGNOSIS — L989 Disorder of the skin and subcutaneous tissue, unspecified: Secondary | ICD-10-CM | POA: Diagnosis not present

## 2016-04-21 DIAGNOSIS — Z6825 Body mass index (BMI) 25.0-25.9, adult: Secondary | ICD-10-CM | POA: Diagnosis not present

## 2016-05-27 DIAGNOSIS — K50813 Crohn's disease of both small and large intestine with fistula: Secondary | ICD-10-CM | POA: Diagnosis not present

## 2016-06-02 DIAGNOSIS — H52203 Unspecified astigmatism, bilateral: Secondary | ICD-10-CM | POA: Diagnosis not present

## 2016-06-02 DIAGNOSIS — H353131 Nonexudative age-related macular degeneration, bilateral, early dry stage: Secondary | ICD-10-CM | POA: Diagnosis not present

## 2016-06-02 DIAGNOSIS — Z961 Presence of intraocular lens: Secondary | ICD-10-CM | POA: Diagnosis not present

## 2016-07-14 ENCOUNTER — Emergency Department (HOSPITAL_COMMUNITY): Payer: PPO

## 2016-07-14 ENCOUNTER — Encounter (HOSPITAL_COMMUNITY): Payer: Self-pay | Admitting: Emergency Medicine

## 2016-07-14 ENCOUNTER — Emergency Department (HOSPITAL_COMMUNITY)
Admission: EM | Admit: 2016-07-14 | Discharge: 2016-07-14 | Disposition: A | Discharge status: Home / Self Care | Payer: PPO | Attending: Emergency Medicine | Admitting: Emergency Medicine

## 2016-07-14 DIAGNOSIS — R42 Dizziness and giddiness: Secondary | ICD-10-CM

## 2016-07-14 DIAGNOSIS — R531 Weakness: Secondary | ICD-10-CM | POA: Diagnosis not present

## 2016-07-14 DIAGNOSIS — R001 Bradycardia, unspecified: Secondary | ICD-10-CM | POA: Insufficient documentation

## 2016-07-14 DIAGNOSIS — Z7982 Long term (current) use of aspirin: Secondary | ICD-10-CM | POA: Insufficient documentation

## 2016-07-14 DIAGNOSIS — Z87891 Personal history of nicotine dependence: Secondary | ICD-10-CM | POA: Diagnosis not present

## 2016-07-14 DIAGNOSIS — R5383 Other fatigue: Secondary | ICD-10-CM | POA: Diagnosis not present

## 2016-07-14 DIAGNOSIS — I10 Essential (primary) hypertension: Secondary | ICD-10-CM | POA: Diagnosis not present

## 2016-07-14 LAB — BASIC METABOLIC PANEL
ANION GAP: 4 — AB (ref 5–15)
BUN: 10 mg/dL (ref 6–20)
CO2: 24 mmol/L (ref 22–32)
Calcium: 8.7 mg/dL — ABNORMAL LOW (ref 8.9–10.3)
Chloride: 107 mmol/L (ref 101–111)
Creatinine, Ser: 1.32 mg/dL — ABNORMAL HIGH (ref 0.61–1.24)
GFR calc Af Amer: 58 mL/min — ABNORMAL LOW (ref >60)
GFR, EST NON AFRICAN AMERICAN: 50 mL/min — AB (ref >60)
GLUCOSE: 109 mg/dL — AB (ref 65–99)
POTASSIUM: 4 mmol/L (ref 3.5–5.1)
Sodium: 135 mmol/L (ref 135–145)

## 2016-07-14 LAB — URINALYSIS, ROUTINE W REFLEX MICROSCOPIC
BILIRUBIN URINE: NEGATIVE
Glucose, UA: NEGATIVE mg/dL
Hgb urine dipstick: NEGATIVE
KETONES UR: NEGATIVE mg/dL
Leukocytes, UA: NEGATIVE
NITRITE: NEGATIVE
PROTEIN: NEGATIVE mg/dL
Specific Gravity, Urine: 1.012 (ref 1.005–1.030)
pH: 6.5 (ref 5.0–8.0)

## 2016-07-14 LAB — CBC WITH DIFFERENTIAL/PLATELET
Basophils Absolute: 0 10*3/uL (ref 0.0–0.1)
Basophils Relative: 0 %
Eosinophils Absolute: 0.1 10*3/uL (ref 0.0–0.7)
Eosinophils Relative: 2 %
HCT: 42.5 % (ref 39.0–52.0)
Hemoglobin: 13.9 g/dL (ref 13.0–17.0)
LYMPHS ABS: 0.4 10*3/uL — AB (ref 0.7–4.0)
LYMPHS PCT: 8 %
MCH: 32.3 pg (ref 26.0–34.0)
MCHC: 32.7 g/dL (ref 30.0–36.0)
MCV: 98.8 fL (ref 78.0–100.0)
MONO ABS: 0.4 10*3/uL (ref 0.1–1.0)
Monocytes Relative: 8 %
Neutro Abs: 4 10*3/uL (ref 1.7–7.7)
Neutrophils Relative %: 82 %
Platelets: 205 10*3/uL (ref 150–400)
RBC: 4.3 MIL/uL (ref 4.22–5.81)
RDW: 13.5 % (ref 11.5–15.5)
WBC: 4.9 10*3/uL (ref 4.0–10.5)

## 2016-07-14 LAB — MAGNESIUM: MAGNESIUM: 2.2 mg/dL (ref 1.7–2.4)

## 2016-07-14 MED ORDER — SODIUM CHLORIDE 0.9 % IV BOLUS (SEPSIS)
1000.0000 mL | Freq: Once | INTRAVENOUS | Status: AC
  Administered 2016-07-14: 1000 mL via INTRAVENOUS

## 2016-07-14 MED ORDER — METOPROLOL TARTRATE 50 MG PO TABS: 25.0000 mg | ORAL_TABLET | Freq: Every day | ORAL | 0 refills | Status: DC

## 2016-07-14 NOTE — ED Provider Notes (Signed)
[facility] DEPT Provider Note   CSN: [id number] Arrival date & time: 07/14/16  0254  By signing my name below, I, Nicholas [name], attest that this documentation has been prepared under the direction and in the presence of [name], MD. Electronically Signed: Reola [name], ED Scribe. 07/14/16. 3:23 AM.  History   Chief Complaint Chief Complaint  Patient presents with  . Hypertension  . Dizziness   The history is provided by the patient. No language interpreter was used.   HPI Comments: Nicholas [name] is a 75 y.o. male with a PMHx significant of Chron's disease and anemia, who presents to the Emergency Department complaining of intermittent episodes of fatigue x ~1-2 weeks. He report associated dizziness this AM. He further states that he took his BP prior to coming into the ED which read as high. Pt notes that there is not pattern to the onset of his episodes of fatigue. He states that due to his PMHx of Chron's disease he has had all of his ilium and "all but six inches" of his colon surgically removed as a child. Due to this, he has 7-8 BMs a day at baseline. However, during his episodes of fatigue his BMs are slightly decreased. Normal food and fluid intake otherwise. No recent medications dosage changes. He has not been evaluated by his PCP for this problem. Pt takes 81mg  Asprin daily, but no other anticoagulant therapies otherwise. Denies SOB, nausea, diaphoresis, leg swelling, pain, or any other associated symptoms.   Past Medical History:  Diagnosis Date  . Anemia    PO IRON   . Aortic stenosis, mild   . Cataract   . Colonic hemorrhage 1964  . Crohn's disease ([place])   . GERD (gastroesophageal reflux disease)   . Heart murmur   . Hyperlipidemia   . Macular degeneration   . PAC (premature atrial contraction)   . PAF (paroxysmal atrial fibrillation) ([place])    SEES DR. BRACKBILL  . Palpitations    Patient Active Problem List   Diagnosis Date Noted  . Sepsis  ([place]) 02/23/2015  . SIRS (systemic inflammatory response syndrome) ([place]) 02/23/2015  . Atrial tachycardia ([place]) 12/11/2012  . Nonspecific (abnormal) findings on radiological and other examination of gastrointestinal tract 04/10/2012  . Abdominal pain, right lower quadrant 04/10/2012  . Aortic stenosis, mild 06/18/2011  . Paroxysmal atrial fibrillation ([place]) 06/18/2011  . Crohn's disease ([place]) 06/18/2011   Past Surgical History:  Procedure Laterality Date  . APPENDECTOMY    . BLADDER REPAIR  1964   intestine leaks/bladder repair  . CARDIOVASCULAR STRESS TEST  11/29/2003   EF 64%  . CARDIOVASCULAR STRESS TEST  11/17/2012   No evidence of ischemia; EF 66%; inferior wall attenuation favored to rep diaphragmatic attenuation  . CATARACT EXTRACTION W/ INTRAOCULAR LENS  IMPLANT, BILATERAL     2013  . ILEOSTOMY  04/16/2012   Procedure: ILEOSTOMY;  Surgeon: Adin Hector, MD;  Location: [facility];  Service: General;  Laterality: N/A;  . ILEOSTOMY CLOSURE  12/07/2012  . ILEOSTOMY CLOSURE  12/07/2012   Procedure: ILEOSTOMY TAKEDOWN;  Surgeon: Adin Hector, MD;  Location: [facility];  Service: General;  Laterality: N/A;  . LAPAROTOMY  04/16/2012   Procedure: EXPLORATORY LAPAROTOMY;  Surgeon: Adin Hector, MD;  Location: [facility];  Service: General;  Laterality: N/A;  . LYSIS OF ADHESION  12/07/2012   Procedure: LYSIS OF ADHESION;  Surgeon: Adin Hector, MD;  Location: [facility];  Service: General;  Laterality: N/A;  .  PROCTOSCOPY  12/07/2012   Procedure: PROCTOSCOPY;  Surgeon: Adin Hector, MD;  Location: [facility];  Service: General;;  ILEO PROCTOSCOPY  . STOMACH SURGERY  1959   ulcer  . TONSILLECTOMY    . US ECHOCARDIOGRAPHY  05/20/2010   EF 55-60%  . US ECHOCARDIOGRAPHY  10/23/2005   EF 55-60%    Home Medications    Prior to Admission medications   Medication Sig Start Date End Date Taking? Authorizing Provider  aspirin 81 MG EC tablet Take 81 mg by mouth every evening.    Yes Historical  Provider, MD  azaTHIOprine (IMURAN) 50 MG tablet Take 50 mg by mouth daily.   Yes Historical Provider, MD  ergocalciferol (VITAMIN D2) 50000 UNITS capsule Take 50,000 Units by mouth every 30 (thirty) days. Last Monday of the month   Yes Historical Provider, MD  fenofibrate (LOFIBRA) 160 MG tablet Take 160 mg by mouth daily.   Yes Historical Provider, MD  IRON PO Take 2 tablets by mouth daily. Iron 65mg    Yes Historical Provider, MD  loperamide (IMODIUM) 2 MG capsule Take 2 mg by mouth daily as needed for diarrhea or loose stools.   Yes Historical Provider, MD  Methylcellulose, Laxative, 500 MG TABS Take 1 tablet by mouth daily as needed (constipation). constipation   Yes Historical Provider, MD  metoprolol (LOPRESSOR) 50 MG tablet Take 25 mg by mouth 2 (two) times daily.  11/14/11  Yes Historical Provider, MD  Multiple Vitamin (MULTI VITAMIN DAILY PO) Take 1 tablet by mouth daily.   Yes Historical Provider, MD  Multiple Vitamins-Minerals (PRESERVISION AREDS 2 PO) Take 2 tablets by mouth daily.   Yes Historical Provider, MD  testosterone cypionate (DEPOTESTOSTERONE CYPIONATE) 200 MG/ML injection Inject 1 mL into the muscle every 21 ( twenty-one) days. 10/10/15  Yes Historical Provider, MD  Wheat Dextrin (BENEFIBER DRINK MIX PO) Take 1 Dose by mouth 3 (three) times daily as needed (when not eating a high fiber diet).    Yes Historical Provider, MD   Family History Family History  Problem Relation Age of Onset  . Heart disease Maternal Grandfather   . Heart disease      maternal side  . Colon cancer Neg Hx     Social History Social History  Substance Use Topics  . Smoking status: Former Smoker    Packs/day: 1.00    Years: 2.00    Quit date: 11/10/1964  . Smokeless tobacco: Never Used  . Alcohol use No   Allergies   Review of patient's allergies indicates no known allergies.  Review of Systems Review of Systems A complete 10 system review of systems was obtained and all systems are  negative except as noted in the HPI and PMH.   Physical Exam Updated Vital Signs BP 149/84   Pulse (!) 49   Temp 97.9 F (36.6 C) (Oral)   Resp 16   SpO2 100%   Physical Exam  Constitutional: He is oriented to person, place, and time. Vital signs are normal. He appears well-developed and well-nourished.  Non-toxic appearance. He does not appear ill. No distress.  HENT:  Head: Normocephalic and atraumatic.  Nose: Nose normal.  Mouth/Throat: Oropharynx is clear and moist. No oropharyngeal exudate.  Eyes: Conjunctivae and EOM are normal. Pupils are equal, round, and reactive to light. No scleral icterus.  Neck: Normal range of motion. Neck supple. No tracheal deviation, no edema, no erythema and normal range of motion present. No thyroid mass and no thyromegaly present.  Cardiovascular: Normal rate, regular rhythm, S1 normal, S2 normal, normal heart sounds, intact distal pulses and normal pulses.  Exam reveals no gallop and no friction rub.   No murmur heard. Pulmonary/Chest: Effort normal and breath sounds normal. No respiratory distress. He has no wheezes. He has no rhonchi. He has no rales.  Abdominal: Soft. Normal appearance and bowel sounds are normal. He exhibits no distension, no ascites and no mass. There is no hepatosplenomegaly. There is no tenderness. There is no rebound, no guarding and no CVA tenderness.  Musculoskeletal: Normal range of motion. He exhibits no edema or tenderness.  Lymphadenopathy:    He has no cervical adenopathy.  Neurological: He is alert and oriented to person, place, and time. He has normal strength. No cranial nerve deficit or sensory deficit.  Skin: Skin is warm, dry and intact. No petechiae and no rash noted. He is not diaphoretic. No erythema. No pallor.  Nursing note and vitals reviewed.  ED Treatments / Results  DIAGNOSTIC STUDIES: Oxygen Saturation is 100% on RA, normal by my interpretation.   COORDINATION OF CARE: 3:22 AM-Discussed next  steps with pt. Pt verbalized understanding and is agreeable with the plan.   Labs (all labs ordered are listed, but only abnormal results are displayed) Labs Reviewed  CBC WITH DIFFERENTIAL/PLATELET - Abnormal; Notable for the following:       Result Value   Lymphs Abs 0.4 (*)    All other components within normal limits  BASIC METABOLIC PANEL - Abnormal; Notable for the following:    Glucose, Bld 109 (*)    Creatinine, Ser 1.32 (*)    Calcium 8.7 (*)    GFR calc non Af Amer 50 (*)    GFR calc Af Amer 58 (*)    Anion gap 4 (*)    All other components within normal limits  URINE CULTURE  URINALYSIS, ROUTINE W REFLEX MICROSCOPIC (NOT AT [facility])  MAGNESIUM    EKG  EKG Interpretation  Date/Time:  Monday July 14 2016 03:15:36 EDT Ventricular Rate:  73 PR Interval:    QRS Duration: 96 QT Interval:  426 QTC Calculation: 404 R Axis:   12 Text Interpretation:  Interpretation limited secondary to artifact Sinus bradycardia bradycardia new since last tracing Confirmed by Glynn Octave [phone]) on 07/14/2016 4:32:20 AM      Radiology Dg Chest 2 View  Result Date: 07/14/2016 CLINICAL DATA:  Initial evaluation for acute weakness, hypertension. EXAM: CHEST  2 VIEW COMPARISON:  Prior radiograph from 02/23/2015. FINDINGS: The cardiac and mediastinal silhouettes are stable in size and contour, and remain within normal limits. The lungs are normally inflated. No airspace consolidation, pleural effusion, or pulmonary edema is identified. There is no pneumothorax. No acute osseous abnormality identified. IMPRESSION: No active cardiopulmonary disease. Electronically Signed   By: Jeannine Boga M.D.   On: 07/14/2016 04:03    Procedures Procedures (including critical care time)  Medications Ordered in ED Medications  sodium chloride 0.9 % bolus 1,000 mL (not administered)     Initial Impression / Assessment and Plan / ED Course  I have reviewed the triage vital signs and the  nursing notes.  Pertinent labs & imaging results that were available during my care of the patient were reviewed by me and considered in my medical decision making (see chart for details).  Clinical Course    Patient presents to the emergency department for weakness and dizziness. His heart rate is a been in the 40s and 50s, this is  not his baseline upon chart review. This is likely due to his metoprolol use. He does have history of atrial fibrillation. He currently takes 50 mg per day. Patient advised to only take 25 mg a day and follow-up with his primary doctor within 3 days for close follow-up. EKG, laboratory studies and chest x-ray do not reveal any cause for his weakness. He states he also gets dizzy at times when he first wakes up in the morning. Patient appears well in no acute distress, vital signs were within his normal limits and he is safe for discharge.  Final Clinical Impressions(s) / ED Diagnoses   Final diagnoses:  None    New Prescriptions New Prescriptions   No medications on file      I personally performed the services described in this documentation, which was scribed in my presence. The recorded information has been reviewed and is accurate.      [name], MD 07/14/16 [phone]

## 2016-07-14 NOTE — ED Triage Notes (Signed)
Pt woke up and "didnt feel right." pt took his bp at home and it was high. Pt states he did feel "whoosey.

## 2016-07-15 LAB — URINE CULTURE: Culture: NO GROWTH

## 2016-07-16 DIAGNOSIS — Z6825 Body mass index (BMI) 25.0-25.9, adult: Secondary | ICD-10-CM | POA: Diagnosis not present

## 2016-07-16 DIAGNOSIS — I480 Paroxysmal atrial fibrillation: Secondary | ICD-10-CM | POA: Diagnosis not present

## 2016-07-16 DIAGNOSIS — R42 Dizziness and giddiness: Secondary | ICD-10-CM | POA: Insufficient documentation

## 2016-09-05 DIAGNOSIS — E559 Vitamin D deficiency, unspecified: Secondary | ICD-10-CM | POA: Diagnosis not present

## 2016-09-05 DIAGNOSIS — E038 Other specified hypothyroidism: Secondary | ICD-10-CM | POA: Diagnosis not present

## 2016-09-05 DIAGNOSIS — R7301 Impaired fasting glucose: Secondary | ICD-10-CM | POA: Diagnosis not present

## 2016-09-05 DIAGNOSIS — E784 Other hyperlipidemia: Secondary | ICD-10-CM | POA: Diagnosis not present

## 2016-09-05 DIAGNOSIS — N183 Chronic kidney disease, stage 3 (moderate): Secondary | ICD-10-CM | POA: Diagnosis not present

## 2016-09-05 DIAGNOSIS — Z125 Encounter for screening for malignant neoplasm of prostate: Secondary | ICD-10-CM | POA: Diagnosis not present

## 2016-09-12 DIAGNOSIS — Z1389 Encounter for screening for other disorder: Secondary | ICD-10-CM | POA: Diagnosis not present

## 2016-09-12 DIAGNOSIS — K5090 Crohn's disease, unspecified, without complications: Secondary | ICD-10-CM | POA: Diagnosis not present

## 2016-09-12 DIAGNOSIS — N183 Chronic kidney disease, stage 3 (moderate): Secondary | ICD-10-CM | POA: Diagnosis not present

## 2016-09-12 DIAGNOSIS — R7301 Impaired fasting glucose: Secondary | ICD-10-CM | POA: Diagnosis not present

## 2016-09-12 DIAGNOSIS — E559 Vitamin D deficiency, unspecified: Secondary | ICD-10-CM | POA: Diagnosis not present

## 2016-09-12 DIAGNOSIS — E038 Other specified hypothyroidism: Secondary | ICD-10-CM | POA: Diagnosis not present

## 2016-09-12 DIAGNOSIS — Z0000 Encounter for general adult medical examination without abnormal findings: Secondary | ICD-10-CM | POA: Diagnosis not present

## 2016-09-12 DIAGNOSIS — I480 Paroxysmal atrial fibrillation: Secondary | ICD-10-CM | POA: Diagnosis not present

## 2016-09-12 DIAGNOSIS — E784 Other hyperlipidemia: Secondary | ICD-10-CM | POA: Diagnosis not present

## 2016-09-12 DIAGNOSIS — Z6825 Body mass index (BMI) 25.0-25.9, adult: Secondary | ICD-10-CM | POA: Diagnosis not present

## 2016-09-15 DIAGNOSIS — Z85828 Personal history of other malignant neoplasm of skin: Secondary | ICD-10-CM | POA: Diagnosis not present

## 2016-09-15 DIAGNOSIS — D225 Melanocytic nevi of trunk: Secondary | ICD-10-CM | POA: Diagnosis not present

## 2016-09-15 DIAGNOSIS — L821 Other seborrheic keratosis: Secondary | ICD-10-CM | POA: Diagnosis not present

## 2016-09-15 DIAGNOSIS — L918 Other hypertrophic disorders of the skin: Secondary | ICD-10-CM | POA: Diagnosis not present

## 2016-09-30 DIAGNOSIS — Z9049 Acquired absence of other specified parts of digestive tract: Secondary | ICD-10-CM | POA: Diagnosis not present

## 2016-09-30 DIAGNOSIS — I4891 Unspecified atrial fibrillation: Secondary | ICD-10-CM | POA: Diagnosis not present

## 2016-09-30 DIAGNOSIS — Z932 Ileostomy status: Secondary | ICD-10-CM | POA: Diagnosis not present

## 2016-09-30 DIAGNOSIS — K50813 Crohn's disease of both small and large intestine with fistula: Secondary | ICD-10-CM | POA: Diagnosis not present

## 2016-09-30 DIAGNOSIS — R911 Solitary pulmonary nodule: Secondary | ICD-10-CM | POA: Diagnosis not present

## 2016-09-30 DIAGNOSIS — K5090 Crohn's disease, unspecified, without complications: Secondary | ICD-10-CM | POA: Diagnosis not present

## 2017-01-29 DIAGNOSIS — K50813 Crohn's disease of both small and large intestine with fistula: Secondary | ICD-10-CM | POA: Diagnosis not present

## 2017-01-29 DIAGNOSIS — N321 Vesicointestinal fistula: Secondary | ICD-10-CM | POA: Diagnosis not present

## 2017-01-29 DIAGNOSIS — Z980 Intestinal bypass and anastomosis status: Secondary | ICD-10-CM | POA: Diagnosis not present

## 2017-03-16 DIAGNOSIS — D225 Melanocytic nevi of trunk: Secondary | ICD-10-CM | POA: Diagnosis not present

## 2017-03-16 DIAGNOSIS — Z85828 Personal history of other malignant neoplasm of skin: Secondary | ICD-10-CM | POA: Diagnosis not present

## 2017-03-16 DIAGNOSIS — L821 Other seborrheic keratosis: Secondary | ICD-10-CM | POA: Diagnosis not present

## 2017-03-16 DIAGNOSIS — L814 Other melanin hyperpigmentation: Secondary | ICD-10-CM | POA: Diagnosis not present

## 2017-05-28 DIAGNOSIS — H8110 Benign paroxysmal vertigo, unspecified ear: Secondary | ICD-10-CM | POA: Diagnosis not present

## 2017-05-28 DIAGNOSIS — H9193 Unspecified hearing loss, bilateral: Secondary | ICD-10-CM | POA: Diagnosis not present

## 2017-05-28 DIAGNOSIS — Z6825 Body mass index (BMI) 25.0-25.9, adult: Secondary | ICD-10-CM | POA: Diagnosis not present

## 2017-05-28 DIAGNOSIS — H6123 Impacted cerumen, bilateral: Secondary | ICD-10-CM | POA: Diagnosis not present

## 2017-05-28 DIAGNOSIS — R030 Elevated blood-pressure reading, without diagnosis of hypertension: Secondary | ICD-10-CM | POA: Diagnosis not present

## 2017-06-09 DIAGNOSIS — Z961 Presence of intraocular lens: Secondary | ICD-10-CM | POA: Diagnosis not present

## 2017-06-09 DIAGNOSIS — H52203 Unspecified astigmatism, bilateral: Secondary | ICD-10-CM | POA: Diagnosis not present

## 2017-06-09 DIAGNOSIS — H26491 Other secondary cataract, right eye: Secondary | ICD-10-CM | POA: Diagnosis not present

## 2017-06-09 DIAGNOSIS — H353131 Nonexudative age-related macular degeneration, bilateral, early dry stage: Secondary | ICD-10-CM | POA: Diagnosis not present

## 2017-07-21 DIAGNOSIS — H6123 Impacted cerumen, bilateral: Secondary | ICD-10-CM | POA: Diagnosis not present

## 2017-07-21 DIAGNOSIS — R42 Dizziness and giddiness: Secondary | ICD-10-CM | POA: Diagnosis not present

## 2017-07-21 DIAGNOSIS — Z87891 Personal history of nicotine dependence: Secondary | ICD-10-CM | POA: Diagnosis not present

## 2017-07-22 ENCOUNTER — Other Ambulatory Visit: Payer: Self-pay | Admitting: Dermatology

## 2017-07-22 DIAGNOSIS — C44622 Squamous cell carcinoma of skin of right upper limb, including shoulder: Secondary | ICD-10-CM | POA: Diagnosis not present

## 2017-08-11 DIAGNOSIS — K50813 Crohn's disease of both small and large intestine with fistula: Secondary | ICD-10-CM | POA: Diagnosis not present

## 2017-09-03 DIAGNOSIS — L905 Scar conditions and fibrosis of skin: Secondary | ICD-10-CM | POA: Diagnosis not present

## 2017-09-03 DIAGNOSIS — Z85828 Personal history of other malignant neoplasm of skin: Secondary | ICD-10-CM | POA: Diagnosis not present

## 2017-09-08 DIAGNOSIS — E038 Other specified hypothyroidism: Secondary | ICD-10-CM | POA: Diagnosis not present

## 2017-09-08 DIAGNOSIS — Z125 Encounter for screening for malignant neoplasm of prostate: Secondary | ICD-10-CM | POA: Diagnosis not present

## 2017-09-08 DIAGNOSIS — E7849 Other hyperlipidemia: Secondary | ICD-10-CM | POA: Diagnosis not present

## 2017-09-08 DIAGNOSIS — R82998 Other abnormal findings in urine: Secondary | ICD-10-CM | POA: Diagnosis not present

## 2017-09-08 DIAGNOSIS — E559 Vitamin D deficiency, unspecified: Secondary | ICD-10-CM | POA: Diagnosis not present

## 2017-09-08 DIAGNOSIS — R7301 Impaired fasting glucose: Secondary | ICD-10-CM | POA: Diagnosis not present

## 2017-09-14 DIAGNOSIS — F0390 Unspecified dementia without behavioral disturbance: Secondary | ICD-10-CM | POA: Diagnosis not present

## 2017-09-14 DIAGNOSIS — R5383 Other fatigue: Secondary | ICD-10-CM | POA: Diagnosis not present

## 2017-09-14 DIAGNOSIS — R42 Dizziness and giddiness: Secondary | ICD-10-CM | POA: Diagnosis not present

## 2017-09-14 DIAGNOSIS — I480 Paroxysmal atrial fibrillation: Secondary | ICD-10-CM | POA: Diagnosis not present

## 2017-09-14 DIAGNOSIS — R7301 Impaired fasting glucose: Secondary | ICD-10-CM | POA: Diagnosis not present

## 2017-09-14 DIAGNOSIS — E559 Vitamin D deficiency, unspecified: Secondary | ICD-10-CM | POA: Diagnosis not present

## 2017-09-14 DIAGNOSIS — N183 Chronic kidney disease, stage 3 (moderate): Secondary | ICD-10-CM | POA: Diagnosis not present

## 2017-09-14 DIAGNOSIS — Z1389 Encounter for screening for other disorder: Secondary | ICD-10-CM | POA: Diagnosis not present

## 2017-09-14 DIAGNOSIS — Z0000 Encounter for general adult medical examination without abnormal findings: Secondary | ICD-10-CM | POA: Diagnosis not present

## 2017-09-14 DIAGNOSIS — E038 Other specified hypothyroidism: Secondary | ICD-10-CM | POA: Diagnosis not present

## 2017-09-14 DIAGNOSIS — E298 Other testicular dysfunction: Secondary | ICD-10-CM | POA: Diagnosis not present

## 2017-09-14 DIAGNOSIS — Z6824 Body mass index (BMI) 24.0-24.9, adult: Secondary | ICD-10-CM | POA: Diagnosis not present

## 2017-09-16 DIAGNOSIS — R5381 Other malaise: Secondary | ICD-10-CM | POA: Diagnosis not present

## 2017-09-16 DIAGNOSIS — Z8679 Personal history of other diseases of the circulatory system: Secondary | ICD-10-CM | POA: Diagnosis not present

## 2017-09-16 DIAGNOSIS — R5383 Other fatigue: Secondary | ICD-10-CM | POA: Diagnosis not present

## 2017-09-16 DIAGNOSIS — R0602 Shortness of breath: Secondary | ICD-10-CM | POA: Diagnosis not present

## 2017-09-16 DIAGNOSIS — I480 Paroxysmal atrial fibrillation: Secondary | ICD-10-CM | POA: Diagnosis not present

## 2017-09-16 DIAGNOSIS — I472 Ventricular tachycardia: Secondary | ICD-10-CM | POA: Diagnosis not present

## 2017-09-21 DIAGNOSIS — R011 Cardiac murmur, unspecified: Secondary | ICD-10-CM | POA: Diagnosis not present

## 2017-09-21 DIAGNOSIS — I4891 Unspecified atrial fibrillation: Secondary | ICD-10-CM | POA: Diagnosis not present

## 2017-09-21 DIAGNOSIS — R0602 Shortness of breath: Secondary | ICD-10-CM | POA: Diagnosis not present

## 2017-09-28 DIAGNOSIS — R0602 Shortness of breath: Secondary | ICD-10-CM | POA: Diagnosis not present

## 2017-09-28 DIAGNOSIS — Z8679 Personal history of other diseases of the circulatory system: Secondary | ICD-10-CM | POA: Diagnosis not present

## 2017-09-28 DIAGNOSIS — R011 Cardiac murmur, unspecified: Secondary | ICD-10-CM | POA: Diagnosis not present

## 2017-10-15 DIAGNOSIS — I480 Paroxysmal atrial fibrillation: Secondary | ICD-10-CM | POA: Diagnosis not present

## 2017-11-11 DIAGNOSIS — R5381 Other malaise: Secondary | ICD-10-CM | POA: Diagnosis not present

## 2017-11-11 DIAGNOSIS — R0602 Shortness of breath: Secondary | ICD-10-CM | POA: Diagnosis not present

## 2017-11-11 DIAGNOSIS — I472 Ventricular tachycardia: Secondary | ICD-10-CM | POA: Diagnosis not present

## 2017-11-11 DIAGNOSIS — R5383 Other fatigue: Secondary | ICD-10-CM | POA: Diagnosis not present

## 2017-11-14 DIAGNOSIS — R000 Tachycardia, unspecified: Secondary | ICD-10-CM | POA: Diagnosis not present

## 2017-12-04 DIAGNOSIS — H6122 Impacted cerumen, left ear: Secondary | ICD-10-CM | POA: Diagnosis not present

## 2017-12-04 DIAGNOSIS — Z6824 Body mass index (BMI) 24.0-24.9, adult: Secondary | ICD-10-CM | POA: Diagnosis not present

## 2018-02-11 DIAGNOSIS — Z9889 Other specified postprocedural states: Secondary | ICD-10-CM | POA: Diagnosis not present

## 2018-02-11 DIAGNOSIS — K5090 Crohn's disease, unspecified, without complications: Secondary | ICD-10-CM | POA: Diagnosis not present

## 2018-02-11 DIAGNOSIS — Z79899 Other long term (current) drug therapy: Secondary | ICD-10-CM | POA: Diagnosis not present

## 2018-02-11 DIAGNOSIS — K50813 Crohn's disease of both small and large intestine with fistula: Secondary | ICD-10-CM | POA: Diagnosis not present

## 2018-03-04 DIAGNOSIS — L570 Actinic keratosis: Secondary | ICD-10-CM | POA: Diagnosis not present

## 2018-03-04 DIAGNOSIS — D229 Melanocytic nevi, unspecified: Secondary | ICD-10-CM | POA: Diagnosis not present

## 2018-03-04 DIAGNOSIS — L814 Other melanin hyperpigmentation: Secondary | ICD-10-CM | POA: Diagnosis not present

## 2018-03-04 DIAGNOSIS — L821 Other seborrheic keratosis: Secondary | ICD-10-CM | POA: Diagnosis not present

## 2018-03-04 DIAGNOSIS — D1801 Hemangioma of skin and subcutaneous tissue: Secondary | ICD-10-CM | POA: Diagnosis not present

## 2018-03-04 DIAGNOSIS — Z85828 Personal history of other malignant neoplasm of skin: Secondary | ICD-10-CM | POA: Diagnosis not present

## 2018-03-10 DIAGNOSIS — H9193 Unspecified hearing loss, bilateral: Secondary | ICD-10-CM | POA: Diagnosis not present

## 2018-03-10 DIAGNOSIS — H6122 Impacted cerumen, left ear: Secondary | ICD-10-CM | POA: Diagnosis not present

## 2018-03-10 DIAGNOSIS — Z6824 Body mass index (BMI) 24.0-24.9, adult: Secondary | ICD-10-CM | POA: Diagnosis not present

## 2018-05-27 DIAGNOSIS — I472 Ventricular tachycardia: Secondary | ICD-10-CM | POA: Diagnosis not present

## 2018-05-27 DIAGNOSIS — R0602 Shortness of breath: Secondary | ICD-10-CM | POA: Diagnosis not present

## 2018-05-27 DIAGNOSIS — Z8679 Personal history of other diseases of the circulatory system: Secondary | ICD-10-CM | POA: Diagnosis not present

## 2018-05-27 DIAGNOSIS — R5381 Other malaise: Secondary | ICD-10-CM | POA: Diagnosis not present

## 2018-06-07 DIAGNOSIS — T1512XA Foreign body in conjunctival sac, left eye, initial encounter: Secondary | ICD-10-CM | POA: Diagnosis not present

## 2018-06-09 DIAGNOSIS — H5712 Ocular pain, left eye: Secondary | ICD-10-CM | POA: Diagnosis not present

## 2018-06-09 DIAGNOSIS — H16102 Unspecified superficial keratitis, left eye: Secondary | ICD-10-CM | POA: Diagnosis not present

## 2018-07-01 DIAGNOSIS — Z6824 Body mass index (BMI) 24.0-24.9, adult: Secondary | ICD-10-CM | POA: Diagnosis not present

## 2018-07-01 DIAGNOSIS — M67431 Ganglion, right wrist: Secondary | ICD-10-CM | POA: Insufficient documentation

## 2018-07-09 DIAGNOSIS — H353111 Nonexudative age-related macular degeneration, right eye, early dry stage: Secondary | ICD-10-CM | POA: Diagnosis not present

## 2018-07-09 DIAGNOSIS — H52203 Unspecified astigmatism, bilateral: Secondary | ICD-10-CM | POA: Diagnosis not present

## 2018-07-09 DIAGNOSIS — H26493 Other secondary cataract, bilateral: Secondary | ICD-10-CM | POA: Diagnosis not present

## 2018-07-09 DIAGNOSIS — H353122 Nonexudative age-related macular degeneration, left eye, intermediate dry stage: Secondary | ICD-10-CM | POA: Diagnosis not present

## 2018-07-14 DIAGNOSIS — M67432 Ganglion, left wrist: Secondary | ICD-10-CM | POA: Insufficient documentation

## 2018-07-17 ENCOUNTER — Other Ambulatory Visit: Payer: Self-pay | Admitting: Orthopedic Surgery

## 2018-07-17 DIAGNOSIS — M67432 Ganglion, left wrist: Secondary | ICD-10-CM

## 2018-07-27 DIAGNOSIS — R0781 Pleurodynia: Secondary | ICD-10-CM | POA: Diagnosis not present

## 2018-07-27 DIAGNOSIS — R030 Elevated blood-pressure reading, without diagnosis of hypertension: Secondary | ICD-10-CM | POA: Diagnosis not present

## 2018-07-27 DIAGNOSIS — Z6824 Body mass index (BMI) 24.0-24.9, adult: Secondary | ICD-10-CM | POA: Diagnosis not present

## 2018-07-28 ENCOUNTER — Ambulatory Visit
Admission: RE | Admit: 2018-07-28 | Discharge: 2018-07-28 | Disposition: A | Discharge status: Home / Self Care | Payer: PPO | Source: Ambulatory Visit | Attending: Orthopedic Surgery | Admitting: Orthopedic Surgery

## 2018-07-28 DIAGNOSIS — M67432 Ganglion, left wrist: Secondary | ICD-10-CM | POA: Diagnosis not present

## 2018-08-04 DIAGNOSIS — M67431 Ganglion, right wrist: Secondary | ICD-10-CM | POA: Diagnosis not present

## 2018-09-14 DIAGNOSIS — Z125 Encounter for screening for malignant neoplasm of prostate: Secondary | ICD-10-CM | POA: Diagnosis not present

## 2018-09-14 DIAGNOSIS — R82998 Other abnormal findings in urine: Secondary | ICD-10-CM | POA: Diagnosis not present

## 2018-09-14 DIAGNOSIS — E038 Other specified hypothyroidism: Secondary | ICD-10-CM | POA: Diagnosis not present

## 2018-09-14 DIAGNOSIS — E559 Vitamin D deficiency, unspecified: Secondary | ICD-10-CM | POA: Diagnosis not present

## 2018-09-14 DIAGNOSIS — Z0000 Encounter for general adult medical examination without abnormal findings: Secondary | ICD-10-CM | POA: Insufficient documentation

## 2018-09-14 DIAGNOSIS — R7301 Impaired fasting glucose: Secondary | ICD-10-CM | POA: Diagnosis not present

## 2018-09-14 DIAGNOSIS — E7849 Other hyperlipidemia: Secondary | ICD-10-CM | POA: Diagnosis not present

## 2018-09-14 DIAGNOSIS — N183 Chronic kidney disease, stage 3 (moderate): Secondary | ICD-10-CM | POA: Diagnosis not present

## 2018-09-16 DIAGNOSIS — Z79899 Other long term (current) drug therapy: Secondary | ICD-10-CM | POA: Diagnosis not present

## 2018-09-16 DIAGNOSIS — K50813 Crohn's disease of both small and large intestine with fistula: Secondary | ICD-10-CM | POA: Diagnosis not present

## 2018-09-16 DIAGNOSIS — Z9889 Other specified postprocedural states: Secondary | ICD-10-CM | POA: Diagnosis not present

## 2018-09-21 DIAGNOSIS — Z0000 Encounter for general adult medical examination without abnormal findings: Secondary | ICD-10-CM | POA: Diagnosis not present

## 2018-09-21 DIAGNOSIS — H6122 Impacted cerumen, left ear: Secondary | ICD-10-CM | POA: Diagnosis not present

## 2018-09-21 DIAGNOSIS — K5090 Crohn's disease, unspecified, without complications: Secondary | ICD-10-CM | POA: Diagnosis not present

## 2018-09-21 DIAGNOSIS — I480 Paroxysmal atrial fibrillation: Secondary | ICD-10-CM | POA: Diagnosis not present

## 2018-09-21 DIAGNOSIS — E559 Vitamin D deficiency, unspecified: Secondary | ICD-10-CM | POA: Diagnosis not present

## 2018-09-21 DIAGNOSIS — N183 Chronic kidney disease, stage 3 (moderate): Secondary | ICD-10-CM | POA: Diagnosis not present

## 2018-09-21 DIAGNOSIS — M25561 Pain in right knee: Secondary | ICD-10-CM | POA: Insufficient documentation

## 2018-09-21 DIAGNOSIS — Z6824 Body mass index (BMI) 24.0-24.9, adult: Secondary | ICD-10-CM | POA: Diagnosis not present

## 2018-09-21 DIAGNOSIS — Z1389 Encounter for screening for other disorder: Secondary | ICD-10-CM | POA: Insufficient documentation

## 2018-09-21 DIAGNOSIS — M25562 Pain in left knee: Secondary | ICD-10-CM | POA: Diagnosis not present

## 2018-09-21 DIAGNOSIS — Z7689 Persons encountering health services in other specified circumstances: Secondary | ICD-10-CM | POA: Diagnosis not present

## 2018-09-21 DIAGNOSIS — R7301 Impaired fasting glucose: Secondary | ICD-10-CM | POA: Diagnosis not present

## 2018-09-21 DIAGNOSIS — F0390 Unspecified dementia without behavioral disturbance: Secondary | ICD-10-CM | POA: Diagnosis not present

## 2019-01-20 DIAGNOSIS — H6123 Impacted cerumen, bilateral: Secondary | ICD-10-CM | POA: Diagnosis not present

## 2019-03-17 DIAGNOSIS — K50813 Crohn's disease of both small and large intestine with fistula: Secondary | ICD-10-CM | POA: Diagnosis not present

## 2019-03-28 DIAGNOSIS — K50813 Crohn's disease of both small and large intestine with fistula: Secondary | ICD-10-CM | POA: Diagnosis not present

## 2019-05-10 DIAGNOSIS — C44319 Basal cell carcinoma of skin of other parts of face: Secondary | ICD-10-CM | POA: Diagnosis not present

## 2019-05-10 DIAGNOSIS — Z85828 Personal history of other malignant neoplasm of skin: Secondary | ICD-10-CM | POA: Diagnosis not present

## 2019-05-10 DIAGNOSIS — L819 Disorder of pigmentation, unspecified: Secondary | ICD-10-CM | POA: Diagnosis not present

## 2019-05-10 DIAGNOSIS — D485 Neoplasm of uncertain behavior of skin: Secondary | ICD-10-CM | POA: Diagnosis not present

## 2019-05-10 DIAGNOSIS — L570 Actinic keratosis: Secondary | ICD-10-CM | POA: Diagnosis not present

## 2019-05-10 HISTORY — PX: OTHER SURGICAL HISTORY: SHX169

## 2019-05-27 ENCOUNTER — Encounter: Payer: Self-pay | Admitting: Cardiology

## 2019-05-30 ENCOUNTER — Encounter: Payer: Self-pay | Admitting: Cardiology

## 2019-05-30 ENCOUNTER — Ambulatory Visit: Payer: PPO | Admitting: Cardiology

## 2019-05-30 ENCOUNTER — Other Ambulatory Visit: Payer: Self-pay

## 2019-05-30 VITALS — BP 144/68 | HR 67 | Ht 69.0 in | Wt 152.0 lb

## 2019-05-30 DIAGNOSIS — D509 Iron deficiency anemia, unspecified: Secondary | ICD-10-CM | POA: Diagnosis not present

## 2019-05-30 DIAGNOSIS — R0609 Other forms of dyspnea: Secondary | ICD-10-CM | POA: Diagnosis not present

## 2019-05-30 DIAGNOSIS — R5381 Other malaise: Secondary | ICD-10-CM | POA: Diagnosis not present

## 2019-05-30 DIAGNOSIS — Z8679 Personal history of other diseases of the circulatory system: Secondary | ICD-10-CM | POA: Diagnosis not present

## 2019-05-30 DIAGNOSIS — R634 Abnormal weight loss: Secondary | ICD-10-CM

## 2019-05-30 DIAGNOSIS — R5383 Other fatigue: Secondary | ICD-10-CM | POA: Diagnosis not present

## 2019-05-30 NOTE — Progress Notes (Signed)
Primary Physician:  Leanna Battles, MD   Patient ID: Nicholas [name], male    DOB: 22-Apr-1941, 78 y.o.   MRN: [id number]  Subjective:    No chief complaint on file.  This visit type was conducted due to national recommendations for restrictions regarding the COVID-19 Pandemic (e.g. social distancing).  This format is felt to be most appropriate for this patient at this time.  All issues noted in this document were discussed and addressed.  No physical exam was performed (except for noted visual exam findings with Telehealth visits).  The patient has consented to conduct a Telehealth visit and understands insurance will be billed.   I discussed the limitations of evaluation and management by telemedicine and the availability of in person appointments. The patient expressed understanding and agreed to proceed.  Virtual Visit via Video Note is as below  I connected with Nicholas [name], on 05/30/19 at 1340 by telephone and verified that I am speaking with the correct person using two identifiers. Unable to perform video visit as patient did not have equipment.    I have discussed with the patient regarding the safety during COVID Pandemic and steps and precautions including social distancing with the patient.    HPI: Nicholas [name]  is a 78 y.o. male  with recurrent dyspnea, dyslipidemia, history of paroxysmal atrial fibrillation by history in January 2005, diet-controlled diabetes and less than 5-pack-year history of tobacco use quit in 1970.  Patient was at [facility] in January 2014 when he had reversal of ileostomy, at that time postoperatively on 11/12/2012 had wide complex tachycaardia and also narrow complex tachycardia suggestive of PSVT and also A. Flutter with abberancy. Echocardiogram revealed no significant valvular abnormality, EF 60-65% and nuclear stress test revealed no evidence of ischemia. He was told to have to have A. Fib in 2005.  30 day event monitor revealed normal  sinus rhythm without evidence of A. fib or a flutter. Echocardiogram was essentially normal with no valvular abnormalities. He also underwent nuclear stress testing in November 2018 that was considered low risk. He has not had any reoccurence of A fib or wide complex tachycardia. He is only on ASA therapy per patient choice.  He now presents for 1 year follow up. He continues to have dyspnea on exertion and fatigue that is stable and unchanged. He has not had any heart racing or known recurrence of A fib. He does mention occasionally have irregular heart beat on EKG on home BP monitor, but is asymptomatic of this. He denies any chest pain. He has not been exercising in the gym due to pandemic, but feels that he has remained fairly active.   Does report 10 lb weight loss since January that is unintentional. He denies any bleeding. He is to see his PCP in Nov for annual follow up.   Past Medical History:  Diagnosis Date  . Anemia    PO IRON   . Aortic stenosis, mild   . Cataract   . Colonic hemorrhage 1964  . Crohn's disease ([place])   . GERD (gastroesophageal reflux disease)   . Heart murmur   . Hyperlipidemia   . Macular degeneration   . PAC (premature atrial contraction)   . PAF (paroxysmal atrial fibrillation) ([place])    SEES DR. BRACKBILL  . Palpitations     Past Surgical History:  Procedure Laterality Date  . APPENDECTOMY    . BLADDER REPAIR  1964   intestine leaks/bladder repair  . CARDIOVASCULAR STRESS  TEST  11/29/2003   EF 64%  . CARDIOVASCULAR STRESS TEST  11/17/2012   No evidence of ischemia; EF 66%; inferior wall attenuation favored to rep diaphragmatic attenuation  . CATARACT EXTRACTION W/ INTRAOCULAR LENS  IMPLANT, BILATERAL     2013  . ILEOSTOMY  04/16/2012   Procedure: ILEOSTOMY;  Surgeon: Adin Hector, MD;  Location: [facility];  Service: General;  Laterality: N/A;  . ILEOSTOMY CLOSURE  12/07/2012  . ILEOSTOMY CLOSURE  12/07/2012   Procedure: ILEOSTOMY TAKEDOWN;  Surgeon:  Adin Hector, MD;  Location: [facility];  Service: General;  Laterality: N/A;  . LAPAROTOMY  04/16/2012   Procedure: EXPLORATORY LAPAROTOMY;  Surgeon: Adin Hector, MD;  Location: [facility];  Service: General;  Laterality: N/A;  . LYSIS OF ADHESION  12/07/2012   Procedure: LYSIS OF ADHESION;  Surgeon: Adin Hector, MD;  Location: [facility];  Service: General;  Laterality: N/A;  . PROCTOSCOPY  12/07/2012   Procedure: PROCTOSCOPY;  Surgeon: Adin Hector, MD;  Location: [facility];  Service: General;;  ILEO PROCTOSCOPY  . STOMACH SURGERY  1959   ulcer  . TONSILLECTOMY    . US ECHOCARDIOGRAPHY  05/20/2010   EF 55-60%  . US ECHOCARDIOGRAPHY  10/23/2005   EF 55-60%    Social History   Socioeconomic History  . Marital status: Married    Spouse name: Not on file  . Number of children: 3  . Years of education: Not on file  . Highest education level: Not on file  Occupational History  . Occupation: Retired    Fish farm manager: [employer]  . Financial resource strain: Not on file  . Food insecurity    Worry: Not on file    Inability: Not on file  . Transportation needs    Medical: Not on file    Non-medical: Not on file  Tobacco Use  . Smoking status: Former Smoker    Packs/day: 1.00    Years: 2.00    Pack years: 2.00    Quit date: 11/10/1964    Years since quitting: 54.5  . Smokeless tobacco: Never Used  Substance and Sexual Activity  . Alcohol use: No  . Drug use: No  . Sexual activity: Not on file  Lifestyle  . Physical activity    Days per week: Not on file    Minutes per session: Not on file  . Stress: Not on file  Relationships  . Social Herbalist on phone: Not on file    Gets together: Not on file    Attends religious service: Not on file    Active member of club or organization: Not on file    Attends meetings of clubs or organizations: Not on file    Relationship status: Not on file  . Intimate partner violence    Fear of current or ex partner:  Not on file    Emotionally abused: Not on file    Physically abused: Not on file    Forced sexual activity: Not on file  Other Topics Concern  . Not on file  Social History Narrative   caffeinated drinks less than one a day    Review of Systems  Constitution: Positive for malaise/fatigue and weight loss. Negative for decreased appetite and weight gain.  Eyes: Negative for visual disturbance.  Cardiovascular: Positive for dyspnea on exertion. Negative for chest pain, claudication, leg swelling, orthopnea, palpitations and syncope.  Respiratory: Negative for hemoptysis and wheezing.  Endocrine: Negative for cold intolerance and heat intolerance.  Hematologic/Lymphatic: Does not bruise/bleed easily.  Skin: Negative for nail changes.  Musculoskeletal: Negative for muscle weakness and myalgias.  Gastrointestinal: Negative for abdominal pain, change in bowel habit, nausea and vomiting.  Neurological: Negative for difficulty with concentration, dizziness, focal weakness and headaches.  Psychiatric/Behavioral: Negative for altered mental status and suicidal ideas.  All other systems reviewed and are negative.     Objective:  There were no vitals taken for this visit. There is no height or weight on file to calculate BMI.    Physical exam not performed or limited due to virtual visit.    Please see exam details from prior visit is as below.   Physical Exam  Constitutional: He is oriented to person, place, and time. Vital signs are normal. He appears well-developed and well-nourished.  HENT:  Head: Normocephalic and atraumatic.  Neck: Normal range of motion.  Cardiovascular: Normal rate, regular rhythm and intact distal pulses. Exam reveals a midsystolic click.  Murmur heard.  Mid to late systolic murmur is present with a grade of 2/6 at the apex. Pulmonary/Chest: Effort normal and breath sounds normal. No accessory muscle usage. No respiratory distress.  Abdominal: Soft. Bowel  sounds are normal.  Musculoskeletal: Normal range of motion.  Neurological: He is alert and oriented to person, place, and time.  Skin: Skin is warm and dry.  Vitals reviewed.  Radiology: No results found.  Laboratory examination:    CMP Latest Ref Rng & Units 07/14/2016 02/24/2015 02/23/2015  Glucose 65 - 99 mg/dL 109(H) 92 139(H)  BUN 6 - 20 mg/dL 10 11 13   Creatinine 0.61 - 1.24 mg/dL 1.32(H) 1.38(H) 1.39(H)  Sodium 135 - 145 mmol/L 135 137 135  Potassium 3.5 - 5.1 mmol/L 4.0 3.8 3.3(L)  Chloride 101 - 111 mmol/L 107 107 104  CO2 22 - 32 mmol/L 24 19 19   Calcium 8.9 - 10.3 mg/dL 8.7(L) 8.0(L) 9.0  Total Protein 6.0 - 8.3 g/dL - 5.2(L) 6.1  Total Bilirubin 0.3 - 1.2 mg/dL - 0.7 0.9  Alkaline Phos 39 - 117 U/L - 41 52  AST 0 - 37 U/L - 23 28  ALT 0 - 53 U/L - 14 16   CBC Latest Ref Rng & Units 07/14/2016 02/24/2015 02/23/2015  WBC 4.0 - 10.5 K/uL 4.9 8.1 9.7  Hemoglobin 13.0 - 17.0 g/dL 13.9 12.3(L) 13.6  Hematocrit 39.0 - 52.0 % 42.5 36.8(L) 39.4  Platelets 150 - 400 K/uL 205 158 172   Lipid Panel     Component Value Date/Time   CHOL 60 04/19/2012 0411   TRIG 103 04/19/2012 0411   HEMOGLOBIN A1C No results found for: HGBA1C, MPG TSH No results for input(s): TSH in the last 8760 hours.  PRN Meds:. Medications Discontinued During This Encounter  Medication Reason  . aspirin 81 MG EC tablet   . Methylcellulose, Laxative, 500 MG TABS    No outpatient medications have been marked as taking for the 05/30/19 encounter (Appointment) with Miquel Dunn, NP.    Cardiac Studies:   Event Monitor [09/16/2017]: Sinus rhythm, heart rate 37-126 bpm. symptoms of fatigue with sinus rhythm at 80 bpm. No atrial fibrillation, atrial flutter  Nuclear stress test [09/21/2017]: 1. The patient performed treadmill exercise using a Bruce protocol, completing 07:15 min. The patient did not experience symptoms other than fatigue during the procedure. The patient completed an estimated  workload of 8.96 METS. Resting blood pressure was 152/82 mmHg and peak effect blood  pressure was 206/56 mmHg. 2. Perfusion study is normal without ischemia or scar and normal LVEF. Low risk study.  Echocardiogram [09/28/2017]: Left ventricle cavity is normal in size. Normal global wall motion. Visual EF is 50-55%. Normal diastolic filling pattern. No significant valvular abnormalities. No evidence of pulmonary hypertension.  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 Also had narrow complex tachy for 15 minutes that again is suspicious for A. Fl with 2:1 conduction vs atrial tachycardia vs PSVT.  Assessment:     ICD-10-CM   1. Dyspnea on exertion  R06.09   2. History of atrial fibrillation  Z86.79   3. Malaise and fatigue  R53.81    R53.83   4. Recent unintentional weight loss over several months  R63.4   5. Chronic iron deficiency anemia  D50.9     EKG 05/27/2018: Normal sinus rhythm with rate of 64 bpm, normal axis, no evidence of ischemia. Normal EKG. No change from EKG 09/16/2017.  Recommendations:   Patient is presently doing well, he has not had any recurrence of heart racing symptoms since last seen by me 1 year ago.  He does occasionally notice irregular heartbeat when his blood pressure machine when checking his blood pressure, but is asymptomatic.  Blood pressure has overall been stable.  We will continue with current medical therapy.  He continues to have dyspnea on exertion and fatigue that he states is better some days compared to others but is overall stable.  Question if related to iron deficiency anemia from Crohn's disease.  He has not had any known recurrence of A. fib since 2005.  He is not on anticoagulation due to patient wishes.  He is only on aspirin therapy.  He is aware of stroke risk associated with A. fib.  He is scheduled to see his PCP in November for annual follow-up.  He does mention 10 pound weight loss  since January, but has recently stabilized.  Urged him to continue to monitor and if he continues to lose weight, he should be evaluated by his PCP.  He has not been exercising in the gym regularly due to pandemic.  I have encouraged him to start 30 minutes of daily walking to prevent any deconditioning.  I will see him back in 6 months in the office for follow-up, but encouraged him to contact me sooner if needed.  Miquel Dunn, MSN, APRN, FNP-C [facility]. [place] Office: [phone] Fax: [phone]

## 2019-06-02 DIAGNOSIS — H0014 Chalazion left upper eyelid: Secondary | ICD-10-CM | POA: Diagnosis not present

## 2019-06-16 DIAGNOSIS — L570 Actinic keratosis: Secondary | ICD-10-CM | POA: Diagnosis not present

## 2019-06-16 DIAGNOSIS — L819 Disorder of pigmentation, unspecified: Secondary | ICD-10-CM | POA: Diagnosis not present

## 2019-06-16 DIAGNOSIS — L814 Other melanin hyperpigmentation: Secondary | ICD-10-CM | POA: Diagnosis not present

## 2019-06-16 DIAGNOSIS — D229 Melanocytic nevi, unspecified: Secondary | ICD-10-CM | POA: Diagnosis not present

## 2019-06-16 DIAGNOSIS — L821 Other seborrheic keratosis: Secondary | ICD-10-CM | POA: Diagnosis not present

## 2019-06-16 DIAGNOSIS — D1801 Hemangioma of skin and subcutaneous tissue: Secondary | ICD-10-CM | POA: Diagnosis not present

## 2019-06-16 DIAGNOSIS — Z85828 Personal history of other malignant neoplasm of skin: Secondary | ICD-10-CM | POA: Diagnosis not present

## 2019-06-16 DIAGNOSIS — D485 Neoplasm of uncertain behavior of skin: Secondary | ICD-10-CM | POA: Diagnosis not present

## 2019-06-16 DIAGNOSIS — C4442 Squamous cell carcinoma of skin of scalp and neck: Secondary | ICD-10-CM | POA: Diagnosis not present

## 2019-07-11 DIAGNOSIS — H524 Presbyopia: Secondary | ICD-10-CM | POA: Diagnosis not present

## 2019-07-11 DIAGNOSIS — H353122 Nonexudative age-related macular degeneration, left eye, intermediate dry stage: Secondary | ICD-10-CM | POA: Diagnosis not present

## 2019-07-11 DIAGNOSIS — Z961 Presence of intraocular lens: Secondary | ICD-10-CM | POA: Diagnosis not present

## 2019-07-12 DIAGNOSIS — C44319 Basal cell carcinoma of skin of other parts of face: Secondary | ICD-10-CM | POA: Diagnosis not present

## 2019-07-20 DIAGNOSIS — L905 Scar conditions and fibrosis of skin: Secondary | ICD-10-CM | POA: Diagnosis not present

## 2019-07-20 DIAGNOSIS — C4442 Squamous cell carcinoma of skin of scalp and neck: Secondary | ICD-10-CM | POA: Diagnosis not present

## 2019-09-26 DIAGNOSIS — E038 Other specified hypothyroidism: Secondary | ICD-10-CM | POA: Diagnosis not present

## 2019-09-26 DIAGNOSIS — R7301 Impaired fasting glucose: Secondary | ICD-10-CM | POA: Diagnosis not present

## 2019-09-26 DIAGNOSIS — E7849 Other hyperlipidemia: Secondary | ICD-10-CM | POA: Diagnosis not present

## 2019-09-26 DIAGNOSIS — E559 Vitamin D deficiency, unspecified: Secondary | ICD-10-CM | POA: Diagnosis not present

## 2019-09-26 DIAGNOSIS — Z125 Encounter for screening for malignant neoplasm of prostate: Secondary | ICD-10-CM | POA: Diagnosis not present

## 2019-09-27 DIAGNOSIS — K50813 Crohn's disease of both small and large intestine with fistula: Secondary | ICD-10-CM | POA: Diagnosis not present

## 2019-09-27 DIAGNOSIS — R634 Abnormal weight loss: Secondary | ICD-10-CM | POA: Diagnosis not present

## 2019-09-29 DIAGNOSIS — R82998 Other abnormal findings in urine: Secondary | ICD-10-CM | POA: Diagnosis not present

## 2019-09-30 ENCOUNTER — Other Ambulatory Visit: Payer: Self-pay

## 2019-09-30 DIAGNOSIS — Z20822 Contact with and (suspected) exposure to covid-19: Secondary | ICD-10-CM

## 2019-10-03 DIAGNOSIS — K5090 Crohn's disease, unspecified, without complications: Secondary | ICD-10-CM | POA: Diagnosis not present

## 2019-10-03 DIAGNOSIS — K912 Postsurgical malabsorption, not elsewhere classified: Secondary | ICD-10-CM | POA: Diagnosis not present

## 2019-10-03 DIAGNOSIS — R7301 Impaired fasting glucose: Secondary | ICD-10-CM | POA: Diagnosis not present

## 2019-10-03 DIAGNOSIS — E46 Unspecified protein-calorie malnutrition: Secondary | ICD-10-CM | POA: Diagnosis not present

## 2019-10-03 DIAGNOSIS — Z0000 Encounter for general adult medical examination without abnormal findings: Secondary | ICD-10-CM | POA: Diagnosis not present

## 2019-10-03 DIAGNOSIS — I480 Paroxysmal atrial fibrillation: Secondary | ICD-10-CM | POA: Diagnosis not present

## 2019-10-03 DIAGNOSIS — Z1331 Encounter for screening for depression: Secondary | ICD-10-CM | POA: Diagnosis not present

## 2019-10-03 DIAGNOSIS — E039 Hypothyroidism, unspecified: Secondary | ICD-10-CM | POA: Diagnosis not present

## 2019-10-03 DIAGNOSIS — Z1339 Encounter for screening examination for other mental health and behavioral disorders: Secondary | ICD-10-CM | POA: Diagnosis not present

## 2019-10-03 DIAGNOSIS — N183 Chronic kidney disease, stage 3 unspecified: Secondary | ICD-10-CM | POA: Diagnosis not present

## 2019-10-03 DIAGNOSIS — R634 Abnormal weight loss: Secondary | ICD-10-CM | POA: Diagnosis not present

## 2019-10-03 DIAGNOSIS — H9193 Unspecified hearing loss, bilateral: Secondary | ICD-10-CM | POA: Diagnosis not present

## 2019-10-03 DIAGNOSIS — E785 Hyperlipidemia, unspecified: Secondary | ICD-10-CM | POA: Diagnosis not present

## 2019-10-03 DIAGNOSIS — G4700 Insomnia, unspecified: Secondary | ICD-10-CM | POA: Insufficient documentation

## 2019-10-03 DIAGNOSIS — E559 Vitamin D deficiency, unspecified: Secondary | ICD-10-CM | POA: Diagnosis not present

## 2019-10-03 LAB — NOVEL CORONAVIRUS, NAA: SARS-CoV-2, NAA: NOT DETECTED

## 2019-10-04 ENCOUNTER — Telehealth: Payer: Self-pay | Admitting: General Practice

## 2019-10-04 NOTE — Telephone Encounter (Signed)
Negative COVID results given. Patient results "NOT Detected." Caller expressed understanding. ° °

## 2019-10-20 ENCOUNTER — Encounter: Payer: Self-pay | Admitting: Neurology

## 2019-10-20 ENCOUNTER — Other Ambulatory Visit: Payer: Self-pay

## 2019-10-20 ENCOUNTER — Ambulatory Visit: Payer: PPO | Admitting: Neurology

## 2019-10-20 VITALS — BP 139/72 | HR 61 | Ht 69.0 in | Wt 153.0 lb

## 2019-10-20 DIAGNOSIS — G4719 Other hypersomnia: Secondary | ICD-10-CM

## 2019-10-20 DIAGNOSIS — R0683 Snoring: Secondary | ICD-10-CM | POA: Diagnosis not present

## 2019-10-20 DIAGNOSIS — I480 Paroxysmal atrial fibrillation: Secondary | ICD-10-CM | POA: Diagnosis not present

## 2019-10-20 NOTE — Progress Notes (Signed)
Subjective:    Patient ID: Nicholas [name] is a 78 y.o. male.  HPI     Nicholas Age, MD, PhD [facility] [address], [address] [place], [place] [address]  Dear Dr. Philip [name],   I saw your patient, Nicholas [name], upon your kind request in my sleep clinic today for initial consultation of his sleep disorder, in particular, concern for underlying obstructive sleep apnea.  The patient is unaccompanied today.  As you know, Nicholas [name] is a 78 year old right-handed gentleman with an underlying medical history of macular degeneration, PACs, paroxysmal A. fib, heart murmur, Crohn's disease, cataract, aortic stenosis, vitamin D deficiency, weight loss, anemia, and hyperlipidemia, who reports snoring and daytime tiredness.  His tiredness has improved a little bit in the past couple of months.  He has had some unintentional weight loss and has started using Ensure, half a bottle twice daily.  He still struggles with diarrhea.  He goes to bed generally around 9-ish and rise time is around 7.  He does not have night to night nocturia and denies any recurrent morning headaches.  His middle daughter has sleep apnea but does not use her CPAP machine.  He has woken up with a snorting sound and his wife has noted occasional snorting sounds when he is asleep.  He does have some restless sleep at times, he does not take any sleep aid.  He had a tonsillectomy at [name] 43.  He has a prescription for Synthroid pending which he has not yet started. I reviewed your telemedicine note from 10/03/2019, which you kindly included.  His Epworth sleepiness score is 10 out of 24, fatigue severity score is 42 out of 63.  He is a retired Furniture conservator/restorer.  He lives with his wife, no pets in the house.  He lost his oldest daughter last year and, tragically, his only granddaughter from her took her own life the year before at [name] 3.   His Past Medical History Is Significant For: Past Medical History:  Diagnosis  Date  . Anemia    PO IRON   . Aortic stenosis, mild   . Cataract   . Colonic hemorrhage 1964  . Crohn's disease ([place])   . GERD (gastroesophageal reflux disease)   . Heart murmur   . Hyperlipidemia   . Macular degeneration   . PAC (premature atrial contraction)   . PAF (paroxysmal atrial fibrillation) ([place])    SEES DR. BRACKBILL  . Palpitations     His Past Surgical History Is Significant For: Past Surgical History:  Procedure Laterality Date  . APPENDECTOMY    . Biopsy Facial  05/10/2019  . BLADDER REPAIR  1964   intestine leaks/bladder repair  . CARDIOVASCULAR STRESS TEST  11/29/2003   EF 64%  . CARDIOVASCULAR STRESS TEST  11/17/2012   No evidence of ischemia; EF 66%; inferior wall attenuation favored to rep diaphragmatic attenuation  . CATARACT EXTRACTION W/ INTRAOCULAR LENS  IMPLANT, BILATERAL     2013  . ILEOSTOMY  04/16/2012   Procedure: ILEOSTOMY;  Surgeon: Adin Hector, MD;  Location: [place];  Service: General;  Laterality: N/A;  . ILEOSTOMY CLOSURE  12/07/2012  . ILEOSTOMY CLOSURE  12/07/2012   Procedure: ILEOSTOMY TAKEDOWN;  Surgeon: Adin Hector, MD;  Location: [place];  Service: General;  Laterality: N/A;  . LAPAROTOMY  04/16/2012   Procedure: EXPLORATORY LAPAROTOMY;  Surgeon: Adin Hector, MD;  Location: [place];  Service: General;  Laterality: N/A;  .  LYSIS OF ADHESION  12/07/2012   Procedure: LYSIS OF ADHESION;  Surgeon: Adin Hector, MD;  Location: [place];  Service: General;  Laterality: N/A;  . PROCTOSCOPY  12/07/2012   Procedure: PROCTOSCOPY;  Surgeon: Adin Hector, MD;  Location: [place];  Service: General;;  ILEO PROCTOSCOPY  . STOMACH SURGERY  1959   ulcer  . TONSILLECTOMY    . US ECHOCARDIOGRAPHY  05/20/2010   EF 55-60%  . US ECHOCARDIOGRAPHY  10/23/2005   EF 55-60%    His Family History Is Significant For: Family History  Problem Relation [name] of Onset  . Heart disease Maternal Grandfather   . Heart disease Other        maternal side  .  Colon cancer Neg Hx     His Social History Is Significant For: Social History   Socioeconomic History  . Marital status: Married    Spouse name: Not on file  . Number of children: 3  . Years of education: Not on file  . Highest education level: Not on file  Occupational History  . Occupation: Retired    Fish farm manager: [employer]  Tobacco Use  . Smoking status: Former Smoker    Packs/day: 1.00    Years: 2.00    Pack years: 2.00    Quit date: 11/10/1964    Years since quitting: 54.9  . Smokeless tobacco: Never Used  Substance and Sexual Activity  . Alcohol use: No  . Drug use: No  . Sexual activity: Not on file  Other Topics Concern  . Not on file  Social History Narrative   caffeinated drinks less than one a day   Social Determinants of Health   Financial Resource Strain:   . Difficulty of Paying Living Expenses: Not on file  Food Insecurity:   . Worried About Charity fundraiser in the Last Year: Not on file  . Ran Out of Food in the Last Year: Not on file  Transportation Needs:   . Lack of Transportation (Medical): Not on file  . Lack of Transportation (Non-Medical): Not on file  Physical Activity:   . Days of Exercise per Week: Not on file  . Minutes of Exercise per Session: Not on file  Stress:   . Feeling of Stress : Not on file  Social Connections:   . Frequency of Communication with Friends and Family: Not on file  . Frequency of Social Gatherings with Friends and Family: Not on file  . Attends Religious Services: Not on file  . Active Member of Clubs or Organizations: Not on file  . Attends Archivist Meetings: Not on file  . Marital Status: Not on file    His Allergies Are:  No Known Allergies:   His Current Medications Are:  Outpatient Encounter Medications as of 10/20/2019  Medication Sig  . aspirin EC 81 MG tablet Take 81 mg by mouth. M, W, F only  . azaTHIOprine (IMURAN) 50 MG tablet Take 50 mg by mouth daily.  . ergocalciferol  (VITAMIN D2) 50000 UNITS capsule Take 50,000 Units by mouth once a week.   . fenofibrate (LOFIBRA) 160 MG tablet Take 160 mg by mouth daily.  . IRON PO Take 2 tablets by mouth daily. Iron 65mg   . levothyroxine (SYNTHROID) 25 MCG tablet Take 25 mcg by mouth daily before breakfast.  . loperamide (IMODIUM) 2 MG capsule Take 2 mg by mouth daily as needed for diarrhea or loose stools.  . metoprolol (LOPRESSOR) 50 MG  tablet Take 0.5 tablets (25 mg total) by mouth daily.  . Multiple Vitamin (MULTI VITAMIN DAILY PO) Take 1 tablet by mouth daily.  . Multiple Vitamins-Minerals (PRESERVISION AREDS 2 PO) Take 2 tablets by mouth daily.  [name] testosterone cypionate (DEPOTESTOSTERONE CYPIONATE) 200 MG/ML injection Inject 1 mL into the muscle every 21 ( twenty-one) days.  . Wheat Dextrin (BENEFIBER DRINK MIX PO) Take 1 Dose by mouth 3 (three) times daily as needed (when not eating a high fiber diet).    No facility-administered encounter medications on file as of 10/20/2019.  :  Review of Systems:  Out of a complete 14 point review of systems, all are reviewed and negative with the exception of these symptoms as listed below: Review of Systems  Neurological:       Pt presents today to discuss his sleep. Pt has never had a sleep study but does endorse snoring.  Epworth Sleepiness Scale 0= would never doze 1= slight chance of dozing 2= moderate chance of dozing 3= high chance of dozing  Sitting and reading: 2 Watching TV: 2 Sitting inactive in a public place (ex. Theater or meeting): 1 As a passenger in a car for an hour without a break: 1 Lying down to rest in the afternoon: 2 Sitting and talking to someone: 0 Sitting quietly after lunch (no alcohol): 2 In a car, while stopped in traffic: 0 Total: 10     Objective:  Neurological Exam  Physical Exam Physical Examination:   Vitals:   10/20/19 1527  BP: 139/72  Pulse: 61    General Examination: The patient is a very pleasant 78 y.o. male  in no acute distress. He appears well-developed and well-nourished and well groomed.   HEENT: Normocephalic, atraumatic, pupils are equal, round and reactive to light, extraocular tracking is good without limitation to gaze excursion or nystagmus noted. Hearing is grossly intact. Face is symmetric with normal facial animation. Speech is clear with no dysarthria noted. There is no hypophonia. There is no lip, neck/head, jaw or voice tremor. Neck is supple with full range of passive and active motion. There are no carotid bruits on auscultation. Oropharynx exam reveals: mild mouth dryness, adequate dental hygiene and mild airway crowding, due to Small airway entry and redundant soft palate.  Tonsils are absent.  Neck circumference is 16 inches, Mallampati is class II, tongue protrudes centrally in palate elevates symmetrically.  Chest: Clear to auscultation without wheezing, rhonchi or crackles noted.  Heart: S1+S2+0, regular and normal without murmurs, rubs or gallops noted.   Abdomen: Soft, non-tender and non-distended with normal bowel sounds appreciated on auscultation.  Extremities: There is no pitting edema in the distal lower extremities bilaterally.   Skin: Warm and dry without trophic changes noted.   Musculoskeletal: exam reveals no obvious joint deformities, tenderness or joint swelling or erythema.   Neurologically:  Mental status: The patient is awake, alert and oriented in all 4 spheres. His immediate and remote memory, attention, language skills and fund of knowledge are appropriate. There is no evidence of aphasia, agnosia, apraxia or anomia. Speech is clear with normal prosody and enunciation. Thought process is linear. Mood is normal and affect is normal.  Cranial nerves II - XII are as described above under HEENT exam.  Motor exam: Normal bulk, strength and tone is noted. There is no tremor, Romberg is negative. Fine motor skills and coordination: grossly intact.  Cerebellar  testing: No dysmetria or intention tremor. There is no truncal or gait ataxia.  Sensory exam: intact to light touch in the upper and lower extremities.  Gait, station and balance: He stands easily. No veering to one side is noted. No leaning to one side is noted. Posture is [name]-appropriate and stance is narrow based. Gait shows normal stride length and normal pace. No problems turning are noted.   Assessment and Plan:  In summary, HRIDAY MARKIEWICZ is a very pleasant 78 y.o.-year old male with an underlying medical history of macular degeneration, PACs, paroxysmal A. fib, heart murmur, Crohn's disease, cataract, aortic stenosis, vitamin D deficiency, weight loss, anemia, and hyperlipidemia, whose history and physical exam are concerning for obstructive sleep apnea (OSA). I had a long chat with the patient about my findings and the diagnosis of OSA, its prognosis and treatment options. We talked about medical treatments, surgical interventions and non-pharmacological approaches. I explained in particular the risks and ramifications of untreated moderate to severe OSA, especially with respect to developing cardiovascular disease down the Road, including congestive heart failure, difficult to treat hypertension, cardiac arrhythmias, or stroke. Even type 2 diabetes has, in part, been linked to untreated OSA. Symptoms of untreated OSA include daytime sleepiness, memory problems, mood irritability and mood disorder such as depression and anxiety, lack of energy, as well as recurrent headaches, especially morning headaches. We talked about trying to maintain a healthy lifestyle in general, as well as the importance of weight control and maintaining a healthy weight. We also talked about the importance of good sleep hygiene. I recommended the following at this time: sleep study.He would prefer a home sleep test.  I would be happy to order it.  I explained the difference between a laboratory sleep study and a home sleep  test.  He would like to also wait until after the first of the year which can certainly be done.  We talked about possible surgical and non-surgical treatment options of OSA, including the use of a CPAP machine. He would be willing to try CPAP if the need arises. I plan to see him back after testing.  I answered all his questions today and he was in agreement.  Thank you very much for allowing me to participate in the care of this nice patient. If I can be of any further assistance to you please do not hesitate to call me at [phone].  Sincerely,   Nicholas Age, MD, PhD

## 2019-10-20 NOTE — Patient Instructions (Signed)

## 2019-11-28 ENCOUNTER — Telehealth: Payer: Self-pay

## 2019-11-28 NOTE — Telephone Encounter (Signed)
Pt still not interested in scheduling HST at this time. Pt stated he had sickness in his family and wanted to wait after flu season. Pt was asked to give me a call back when ready to schedule.

## 2019-11-28 NOTE — Telephone Encounter (Signed)
Error

## 2019-11-30 ENCOUNTER — Encounter: Payer: Self-pay | Admitting: Cardiology

## 2019-11-30 ENCOUNTER — Ambulatory Visit (INDEPENDENT_AMBULATORY_CARE_PROVIDER_SITE_OTHER): Payer: PPO | Admitting: Cardiology

## 2019-11-30 ENCOUNTER — Other Ambulatory Visit: Payer: Self-pay

## 2019-11-30 VITALS — BP 138/73 | HR 56 | Temp 98.8°F | Ht 69.0 in | Wt 156.0 lb

## 2019-11-30 DIAGNOSIS — I480 Paroxysmal atrial fibrillation: Secondary | ICD-10-CM

## 2019-11-30 DIAGNOSIS — R5383 Other fatigue: Secondary | ICD-10-CM

## 2019-11-30 DIAGNOSIS — R5381 Other malaise: Secondary | ICD-10-CM | POA: Diagnosis not present

## 2019-11-30 DIAGNOSIS — R0609 Other forms of dyspnea: Secondary | ICD-10-CM

## 2019-11-30 NOTE — Progress Notes (Signed)
Primary Physician:  Leanna Battles, MD   Patient ID: Nicholas [name], male    DOB: 02-05-1941, 80 y.o.   MRN: [id number]  Subjective:    Chief Complaint  Patient presents with  . Atrial Fibrillation    HPI: Nicholas [name]  is a 79 y.o. male  with recurrent dyspnea, dyslipidemia, history of paroxysmal atrial fibrillation by history in January 2005, diet-controlled diabetes and less than 5-pack-year history of tobacco use quit in 1970.  Patient was at [facility] in January 2014 when he had reversal of ileostomy, at that time postoperatively on 11/12/2012 had wide complex tachycardia and also narrow complex tachycardia suggestive of PSVT and also A. Flutter with abberancy. Echocardiogram revealed no significant valvular abnormality, EF 60-65% and nuclear stress test revealed no evidence of ischemia. He was told to have to have A. Fib in 2005.  30 day event monitor revealed normal sinus rhythm without evidence of A. fib or a flutter. Echocardiogram was essentially normal with no valvular abnormalities. He also underwent nuclear stress testing in November 2018 that was considered low risk. He has not had any reoccurence of A fib or wide complex tachycardia. He is only on ASA therapy per patient choice.  He now presents for 6 month follow up. He is doing well without any significant complaints. Continues to have days of fatigue and dyspnea, but episodes are sporadic. States that Dr. Philip Aspen has recommended home sleep study. He is willing to have this done, but has deferred for now as his daughter has recently been diagnosed with colon cancer and will be caring for her. No exertional difficulties.   His weight has remained stable. He has gained a few pounds recently.   Past Medical History:  Diagnosis Date  . Anemia    PO IRON   . Aortic stenosis, mild   . Cataract   . Colonic hemorrhage 1964  . Crohn's disease ([place])   . GERD (gastroesophageal reflux disease)   . Heart murmur   .  Hyperlipidemia   . Macular degeneration   . PAC (premature atrial contraction)   . PAF (paroxysmal atrial fibrillation) ([place])    SEES DR. BRACKBILL  . Palpitations     Past Surgical History:  Procedure Laterality Date  . APPENDECTOMY    . Biopsy Facial  05/10/2019  . BLADDER REPAIR  1964   intestine leaks/bladder repair  . CARDIOVASCULAR STRESS TEST  11/29/2003   EF 64%  . CARDIOVASCULAR STRESS TEST  11/17/2012   No evidence of ischemia; EF 66%; inferior wall attenuation favored to rep diaphragmatic attenuation  . CATARACT EXTRACTION W/ INTRAOCULAR LENS  IMPLANT, BILATERAL     2013  . ILEOSTOMY  04/16/2012   Procedure: ILEOSTOMY;  Surgeon: Adin Hector, MD;  Location: [place];  Service: General;  Laterality: N/A;  . ILEOSTOMY CLOSURE  12/07/2012  . ILEOSTOMY CLOSURE  12/07/2012   Procedure: ILEOSTOMY TAKEDOWN;  Surgeon: Adin Hector, MD;  Location: [place];  Service: General;  Laterality: N/A;  . LAPAROTOMY  04/16/2012   Procedure: EXPLORATORY LAPAROTOMY;  Surgeon: Adin Hector, MD;  Location: [place];  Service: General;  Laterality: N/A;  . LYSIS OF ADHESION  12/07/2012   Procedure: LYSIS OF ADHESION;  Surgeon: Adin Hector, MD;  Location: [place];  Service: General;  Laterality: N/A;  . PROCTOSCOPY  12/07/2012   Procedure: PROCTOSCOPY;  Surgeon: Adin Hector, MD;  Location: [place];  Service: General;;  ILEO PROCTOSCOPY  . STOMACH SURGERY  1959   ulcer  . TONSILLECTOMY    . US ECHOCARDIOGRAPHY  05/20/2010   EF 55-60%  . US ECHOCARDIOGRAPHY  10/23/2005   EF 55-60%    Social History   Socioeconomic History  . Marital status: Married    Spouse name: Not on file  . Number of children: 3  . Years of education: Not on file  . Highest education level: Not on file  Occupational History  . Occupation: Retired    Fish farm manager: [employer]  Tobacco Use  . Smoking status: Former Smoker    Packs/day: 1.00    Years: 2.00    Pack years: 2.00    Quit date: 11/10/1964    Years  since quitting: 55.0  . Smokeless tobacco: Never Used  Substance and Sexual Activity  . Alcohol use: No  . Drug use: No  . Sexual activity: Not on file  Other Topics Concern  . Not on file  Social History Narrative   caffeinated drinks less than one a day   Social Determinants of Health   Financial Resource Strain:   . Difficulty of Paying Living Expenses: Not on file  Food Insecurity:   . Worried About Charity fundraiser in the Last Year: Not on file  . Ran Out of Food in the Last Year: Not on file  Transportation Needs:   . Lack of Transportation (Medical): Not on file  . Lack of Transportation (Non-Medical): Not on file  Physical Activity:   . Days of Exercise per Week: Not on file  . Minutes of Exercise per Session: Not on file  Stress:   . Feeling of Stress : Not on file  Social Connections:   . Frequency of Communication with Friends and Family: Not on file  . Frequency of Social Gatherings with Friends and Family: Not on file  . Attends Religious Services: Not on file  . Active Member of Clubs or Organizations: Not on file  . Attends Archivist Meetings: Not on file  . Marital Status: Not on file  Intimate Partner Violence:   . Fear of Current or Ex-Partner: Not on file  . Emotionally Abused: Not on file  . Physically Abused: Not on file  . Sexually Abused: Not on file    Review of Systems  Constitution: Positive for malaise/fatigue. Negative for decreased appetite, weight gain and weight loss.  Eyes: Negative for visual disturbance.  Cardiovascular: Positive for dyspnea on exertion (stable). Negative for chest pain, claudication, leg swelling, orthopnea, palpitations and syncope.  Respiratory: Negative for hemoptysis and wheezing.   Endocrine: Negative for cold intolerance and heat intolerance.  Hematologic/Lymphatic: Does not bruise/bleed easily.  Skin: Negative for nail changes.  Musculoskeletal: Negative for muscle weakness and myalgias.   Gastrointestinal: Negative for abdominal pain, change in bowel habit, nausea and vomiting.  Neurological: Negative for difficulty with concentration, dizziness, focal weakness and headaches.  Psychiatric/Behavioral: Negative for altered mental status and suicidal ideas.  All other systems reviewed and are negative.     Objective:  Blood pressure 138/73, pulse (!) 56, temperature 98.8 F (37.1 C), height 5\' 9"  (1.753 m), weight 156 lb (70.8 kg), SpO2 99 %. Body mass index is 23.04 kg/m.     Physical Exam  Constitutional: He is oriented to person, place, and time. Vital signs are normal. He appears well-developed and well-nourished.  HENT:  Head: Normocephalic and atraumatic.  Cardiovascular: Normal rate, regular rhythm and intact distal pulses. Exam reveals a midsystolic click.  Murmur  heard.  Mid to late systolic murmur is present with a grade of 2/6 at the apex. Pulmonary/Chest: Effort normal and breath sounds normal. No accessory muscle usage. No respiratory distress.  Abdominal: Soft. Bowel sounds are normal.  Musculoskeletal:        General: Normal range of motion.     Cervical back: Normal range of motion.  Neurological: He is alert and oriented to person, place, and time.  Skin: Skin is warm and dry.  Vitals reviewed.  Radiology: No results found.  Laboratory examination:    CMP Latest Ref Rng & Units 07/14/2016 02/24/2015 02/23/2015  Glucose 65 - 99 mg/dL 109(H) 92 139(H)  BUN 6 - 20 mg/dL 10 11 13   Creatinine 0.61 - 1.24 mg/dL 1.32(H) 1.38(H) 1.39(H)  Sodium 135 - 145 mmol/L 135 137 135  Potassium 3.5 - 5.1 mmol/L 4.0 3.8 3.3(L)  Chloride 101 - 111 mmol/L 107 107 104  CO2 22 - 32 mmol/L 24 19 19   Calcium 8.9 - 10.3 mg/dL 8.7(L) 8.0(L) 9.0  Total Protein 6.0 - 8.3 g/dL - 5.2(L) 6.1  Total Bilirubin 0.3 - 1.2 mg/dL - 0.7 0.9  Alkaline Phos 39 - 117 U/L - 41 52  AST 0 - 37 U/L - 23 28  ALT 0 - 53 U/L - 14 16   CBC Latest Ref Rng & Units 07/14/2016 02/24/2015 02/23/2015   WBC 4.0 - 10.5 K/uL 4.9 8.1 9.7  Hemoglobin 13.0 - 17.0 g/dL 13.9 12.3(L) 13.6  Hematocrit 39.0 - 52.0 % 42.5 36.8(L) 39.4  Platelets 150 - 400 K/uL 205 158 172   Lipid Panel     Component Value Date/Time   CHOL 60 04/19/2012 0411   TRIG 103 04/19/2012 0411   HEMOGLOBIN A1C No results found for: HGBA1C, MPG TSH No results for input(s): TSH in the last 8760 hours.  PRN Meds:. There are no discontinued medications. Current Meds  Medication Sig  . aspirin EC 81 MG tablet Take 81 mg by mouth. M, W, F only  . azaTHIOprine (IMURAN) 50 MG tablet Take 50 mg by mouth daily.  . ergocalciferol (VITAMIN D2) 50000 UNITS capsule Take 50,000 Units by mouth once a week.   . fenofibrate (LOFIBRA) 160 MG tablet Take 160 mg by mouth daily.  . IRON PO Take 2 tablets by mouth daily. Iron 65mg   . levothyroxine (SYNTHROID) 25 MCG tablet Take 25 mcg by mouth daily before breakfast.  . loperamide (IMODIUM) 2 MG capsule Take 2 mg by mouth daily as needed for diarrhea or loose stools.  . metoprolol (LOPRESSOR) 50 MG tablet Take 0.5 tablets (25 mg total) by mouth daily.  . Multiple Vitamin (MULTI VITAMIN DAILY PO) Take 1 tablet by mouth daily.  . Multiple Vitamins-Minerals (PRESERVISION AREDS 2 PO) Take 2 tablets by mouth daily.  [name] testosterone cypionate (DEPOTESTOSTERONE CYPIONATE) 200 MG/ML injection Inject 1 mL into the muscle every 21 ( twenty-one) days.  . Wheat Dextrin (BENEFIBER DRINK MIX PO) Take 1 Dose by mouth 3 (three) times daily as needed (when not eating a high fiber diet).     Cardiac Studies:   Event Monitor [09/16/2017]: Sinus rhythm, heart rate 37-126 bpm. symptoms of fatigue with sinus rhythm at 80 bpm. No atrial fibrillation, atrial flutter  Nuclear stress test [09/21/2017]: 1. The patient performed treadmill exercise using a Bruce protocol, completing 07:15 min. The patient did not experience symptoms other than fatigue during the procedure. The patient completed an estimated  workload of 8.96 METS. Resting blood pressure was  152/82 mmHg and peak effect blood pressure was 206/56 mmHg. 2. Perfusion study is normal without ischemia or scar and normal LVEF. Low risk study.  Echocardiogram [09/28/2017]: Left ventricle cavity is normal in size. Normal global wall motion. Visual EF is 50-55%. Normal diastolic filling pattern. No significant valvular abnormalities. No evidence of pulmonary hypertension.  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 Also had narrow complex tachy for 15 minutes that again is suspicious for A. Fl with 2:1 conduction vs atrial tachycardia vs PSVT.  Assessment:     ICD-10-CM   1. Paroxysmal atrial fibrillation (HCC)  I48.0 EKG 12-Lead  2. Malaise and fatigue  R53.81    R53.83   3. Dyspnea on exertion  R06.00     EKG 11/30/2019: Sinus bradycardia at 56 bpm, normal axis, no evidence of ischemia.  Recommendations:   Patient is here for 6 month follow up, doing well without any complaints. His dyspnea on exertion has been stable. No clinical evidence of heart failure or symptoms of angina. He does continue to have episodes of fatigue that are potentially related to underlying sleep apnea. I have recommended that he pursue sleep study as recommended by his PCP for evaluation when he is able. He has not had any known recurrence of A fib. EKG is unchanged. Reports that his labs with PCP have all been stable, will request for our records.  Will plan to see him back in 1 year or sooner if problems.   Miquel Dunn, MSN, APRN, FNP-C [facility]. [place] Office: [phone] Fax: [phone]

## 2019-12-02 ENCOUNTER — Encounter: Payer: Self-pay | Admitting: Cardiology

## 2019-12-22 DIAGNOSIS — L821 Other seborrheic keratosis: Secondary | ICD-10-CM | POA: Diagnosis not present

## 2019-12-22 DIAGNOSIS — L814 Other melanin hyperpigmentation: Secondary | ICD-10-CM | POA: Diagnosis not present

## 2019-12-22 DIAGNOSIS — D1801 Hemangioma of skin and subcutaneous tissue: Secondary | ICD-10-CM | POA: Diagnosis not present

## 2019-12-22 DIAGNOSIS — L819 Disorder of pigmentation, unspecified: Secondary | ICD-10-CM | POA: Diagnosis not present

## 2019-12-22 DIAGNOSIS — Z85828 Personal history of other malignant neoplasm of skin: Secondary | ICD-10-CM | POA: Diagnosis not present

## 2019-12-22 DIAGNOSIS — D229 Melanocytic nevi, unspecified: Secondary | ICD-10-CM | POA: Diagnosis not present

## 2019-12-22 DIAGNOSIS — L570 Actinic keratosis: Secondary | ICD-10-CM | POA: Diagnosis not present

## 2019-12-22 DIAGNOSIS — L905 Scar conditions and fibrosis of skin: Secondary | ICD-10-CM | POA: Diagnosis not present

## 2019-12-28 DIAGNOSIS — R1031 Right lower quadrant pain: Secondary | ICD-10-CM | POA: Diagnosis not present

## 2019-12-28 DIAGNOSIS — K50813 Crohn's disease of both small and large intestine with fistula: Secondary | ICD-10-CM | POA: Diagnosis not present

## 2019-12-28 DIAGNOSIS — R1013 Epigastric pain: Secondary | ICD-10-CM | POA: Diagnosis not present

## 2019-12-28 DIAGNOSIS — Z79899 Other long term (current) drug therapy: Secondary | ICD-10-CM | POA: Diagnosis not present

## 2019-12-28 DIAGNOSIS — E039 Hypothyroidism, unspecified: Secondary | ICD-10-CM | POA: Diagnosis not present

## 2019-12-30 DIAGNOSIS — R1013 Epigastric pain: Secondary | ICD-10-CM | POA: Diagnosis not present

## 2020-01-02 DIAGNOSIS — K50813 Crohn's disease of both small and large intestine with fistula: Secondary | ICD-10-CM | POA: Diagnosis not present

## 2020-01-02 DIAGNOSIS — K862 Cyst of pancreas: Secondary | ICD-10-CM | POA: Diagnosis not present

## 2020-01-05 DIAGNOSIS — Z9049 Acquired absence of other specified parts of digestive tract: Secondary | ICD-10-CM | POA: Diagnosis not present

## 2020-01-05 DIAGNOSIS — K50813 Crohn's disease of both small and large intestine with fistula: Secondary | ICD-10-CM | POA: Diagnosis not present

## 2020-01-05 DIAGNOSIS — K861 Other chronic pancreatitis: Secondary | ICD-10-CM | POA: Diagnosis not present

## 2020-01-05 DIAGNOSIS — R1013 Epigastric pain: Secondary | ICD-10-CM | POA: Diagnosis not present

## 2020-01-05 DIAGNOSIS — D490 Neoplasm of unspecified behavior of digestive system: Secondary | ICD-10-CM | POA: Diagnosis not present

## 2020-01-05 DIAGNOSIS — Z8639 Personal history of other endocrine, nutritional and metabolic disease: Secondary | ICD-10-CM | POA: Diagnosis not present

## 2020-01-11 DIAGNOSIS — L03012 Cellulitis of left finger: Secondary | ICD-10-CM | POA: Insufficient documentation

## 2020-01-11 DIAGNOSIS — H353132 Nonexudative age-related macular degeneration, bilateral, intermediate dry stage: Secondary | ICD-10-CM | POA: Diagnosis not present

## 2020-01-11 DIAGNOSIS — M67431 Ganglion, right wrist: Secondary | ICD-10-CM | POA: Diagnosis not present

## 2020-01-12 DIAGNOSIS — L03012 Cellulitis of left finger: Secondary | ICD-10-CM | POA: Diagnosis not present

## 2020-01-18 DIAGNOSIS — L03012 Cellulitis of left finger: Secondary | ICD-10-CM | POA: Diagnosis not present

## 2020-01-22 DIAGNOSIS — Z20822 Contact with and (suspected) exposure to covid-19: Secondary | ICD-10-CM | POA: Diagnosis not present

## 2020-01-22 DIAGNOSIS — Z20828 Contact with and (suspected) exposure to other viral communicable diseases: Secondary | ICD-10-CM | POA: Diagnosis not present

## 2020-01-27 DIAGNOSIS — R1013 Epigastric pain: Secondary | ICD-10-CM | POA: Diagnosis not present

## 2020-01-27 DIAGNOSIS — K861 Other chronic pancreatitis: Secondary | ICD-10-CM | POA: Diagnosis not present

## 2020-01-27 DIAGNOSIS — Z20822 Contact with and (suspected) exposure to covid-19: Secondary | ICD-10-CM | POA: Diagnosis not present

## 2020-01-27 DIAGNOSIS — Z980 Intestinal bypass and anastomosis status: Secondary | ICD-10-CM | POA: Diagnosis not present

## 2020-01-27 DIAGNOSIS — R933 Abnormal findings on diagnostic imaging of other parts of digestive tract: Secondary | ICD-10-CM | POA: Diagnosis not present

## 2020-01-27 DIAGNOSIS — Z9884 Bariatric surgery status: Secondary | ICD-10-CM | POA: Diagnosis not present

## 2020-02-03 DIAGNOSIS — M67432 Ganglion, left wrist: Secondary | ICD-10-CM | POA: Diagnosis not present

## 2020-02-03 DIAGNOSIS — M67431 Ganglion, right wrist: Secondary | ICD-10-CM | POA: Diagnosis not present

## 2020-02-03 DIAGNOSIS — L03012 Cellulitis of left finger: Secondary | ICD-10-CM | POA: Diagnosis not present

## 2020-02-13 ENCOUNTER — Emergency Department (HOSPITAL_COMMUNITY): Payer: PPO

## 2020-02-13 ENCOUNTER — Other Ambulatory Visit: Payer: Self-pay

## 2020-02-13 ENCOUNTER — Emergency Department (HOSPITAL_COMMUNITY)
Admission: EM | Admit: 2020-02-13 | Discharge: 2020-02-13 | Disposition: A | Discharge status: Home / Self Care | Payer: PPO | Attending: Emergency Medicine | Admitting: Emergency Medicine

## 2020-02-13 DIAGNOSIS — R0902 Hypoxemia: Secondary | ICD-10-CM | POA: Diagnosis not present

## 2020-02-13 DIAGNOSIS — Z87891 Personal history of nicotine dependence: Secondary | ICD-10-CM | POA: Diagnosis not present

## 2020-02-13 DIAGNOSIS — Z7982 Long term (current) use of aspirin: Secondary | ICD-10-CM | POA: Insufficient documentation

## 2020-02-13 DIAGNOSIS — Z79899 Other long term (current) drug therapy: Secondary | ICD-10-CM | POA: Insufficient documentation

## 2020-02-13 DIAGNOSIS — I10 Essential (primary) hypertension: Secondary | ICD-10-CM | POA: Diagnosis not present

## 2020-02-13 DIAGNOSIS — R0602 Shortness of breath: Secondary | ICD-10-CM | POA: Diagnosis not present

## 2020-02-13 DIAGNOSIS — R5383 Other fatigue: Secondary | ICD-10-CM | POA: Diagnosis not present

## 2020-02-13 LAB — CBC WITH DIFFERENTIAL/PLATELET
Abs Immature Granulocytes: 0.03 10*3/uL (ref 0.00–0.07)
Basophils Absolute: 0 10*3/uL (ref 0.0–0.1)
Basophils Relative: 1 %
Eosinophils Absolute: 0.1 10*3/uL (ref 0.0–0.5)
Eosinophils Relative: 1 %
HCT: 45 % (ref 39.0–52.0)
Hemoglobin: 14.9 g/dL (ref 13.0–17.0)
Immature Granulocytes: 0 %
Lymphocytes Relative: 4 %
Lymphs Abs: 0.3 10*3/uL — ABNORMAL LOW (ref 0.7–4.0)
MCH: 33 pg (ref 26.0–34.0)
MCHC: 33.1 g/dL (ref 30.0–36.0)
MCV: 99.8 fL (ref 80.0–100.0)
Monocytes Absolute: 0.4 10*3/uL (ref 0.1–1.0)
Monocytes Relative: 6 %
Neutro Abs: 6.5 10*3/uL (ref 1.7–7.7)
Neutrophils Relative %: 88 %
Platelets: 282 10*3/uL (ref 150–400)
RBC: 4.51 MIL/uL (ref 4.22–5.81)
RDW: 12.6 % (ref 11.5–15.5)
WBC: 7.3 10*3/uL (ref 4.0–10.5)
nRBC: 0 % (ref 0.0–0.2)

## 2020-02-13 LAB — D-DIMER, QUANTITATIVE: D-Dimer, Quant: <0.27 ug/mL-FEU (ref 0.00–0.50)

## 2020-02-13 LAB — BASIC METABOLIC PANEL
Anion gap: 14 (ref 5–15)
BUN: 19 mg/dL (ref 8–23)
CO2: 19 mmol/L — ABNORMAL LOW (ref 22–32)
Calcium: 10.1 mg/dL (ref 8.9–10.3)
Chloride: 108 mmol/L (ref 98–111)
Creatinine, Ser: 1.32 mg/dL — ABNORMAL HIGH (ref 0.61–1.24)
GFR calc Af Amer: 59 mL/min — ABNORMAL LOW (ref >60)
GFR calc non Af Amer: 51 mL/min — ABNORMAL LOW (ref >60)
Glucose, Bld: 100 mg/dL — ABNORMAL HIGH (ref 70–99)
Potassium: 4.5 mmol/L (ref 3.5–5.1)
Sodium: 141 mmol/L (ref 135–145)

## 2020-02-13 LAB — BRAIN NATRIURETIC PEPTIDE: B Natriuretic Peptide: 65.2 pg/mL (ref 0.0–100.0)

## 2020-02-13 LAB — TROPONIN I (HIGH SENSITIVITY): Troponin I (High Sensitivity): 5 ng/L (ref <18)

## 2020-02-13 NOTE — Discharge Instructions (Addendum)
Your work-up today was reassuring with no signs of heart attack or blood clot in the lungs or infection within the lungs.  You are not anemic.  Please call your primary care provider and your cardiologist today to schedule close follow-up for reevaluation of your symptoms.  They may want to do additional testing such as of the thyroid or obtain an ultrasound of the heart to ensure that it is functioning appropriately.  I would avoid significant exertion until follow-up with the primary care provider or cardiologist. Please check your oxygen levels with the pulse oximeter at home.  Seek evaluation if your oxygen levels are low (less than 92%).   Return to the emergency department immediately if any concerning signs or symptoms develop such as severe shortness of breath, chest pains, loss of consciousness, high fevers, or low oxygen levels.

## 2020-02-13 NOTE — ED Triage Notes (Signed)
Pt has been having exertional SOB for approximately 1 month, today when spraying his yard it became worse. Denies chest pain, dizziness, nausea/vomitting, COVID exposure. Has had COVID vaccines. Had vaccines.

## 2020-02-13 NOTE — ED Provider Notes (Signed)
[facility] EMERGENCY DEPARTMENT Provider Note   CSN: [id number] Arrival date & time: 02/13/20  1153     History Chief Complaint  Patient presents with  . Shortness of Breath    Nicholas [name] is a 79 y.o. male with history of anemia, Crohn's disease, GERD, hyperlipidemia, paroxysmal A. fib presents for evaluation of acute onset, progressively worsening generalized weakness and shortness of breath for 1 month.  He reports feeling significantly winded with walking very short distances and with minimal activities.  He denies chest pain, fevers, leg swelling, recent travel or surgeries, hemoptysis, prior history of DVT or PE, but he is on testosterone replacement therapy every 3 weeks.  He is a non-smoker.  He denies orthopnea or PND.  He does note that his PCP has been attempting to get him scheduled for a home sleep study to evaluate for OSA but he has not been able to do this.  He currently takes a baby aspirin three times weekly but does not take any other anticoagulation.  Today while walking around his daughter's home he began to feel very dyspneic so he called his cardiologist who recommended presentation to the ED so he called EMS.    The history is provided by the patient.       Past Medical History:  Diagnosis Date  . Anemia    PO IRON   . Aortic stenosis, mild   . Cataract   . Colonic hemorrhage 1964  . Crohn's disease ([place])   . GERD (gastroesophageal reflux disease)   . Heart murmur   . Hyperlipidemia   . Macular degeneration   . PAC (premature atrial contraction)   . PAF (paroxysmal atrial fibrillation) ([place])    SEES DR. BRACKBILL  . Palpitations     Patient Active Problem List   Diagnosis Date Noted  . Sepsis ([place]) 02/23/2015  . SIRS (systemic inflammatory response syndrome) ([place]) 02/23/2015  . Atrial tachycardia ([place]) 12/11/2012  . Nonspecific (abnormal) findings on radiological and other examination of gastrointestinal tract 04/10/2012  .  Abdominal pain, right lower quadrant 04/10/2012  . Aortic stenosis, mild 06/18/2011  . Paroxysmal atrial fibrillation ([place]) 06/18/2011  . Crohn's disease ([place]) 06/18/2011    Past Surgical History:  Procedure Laterality Date  . APPENDECTOMY    . Biopsy Facial  05/10/2019  . BLADDER REPAIR  1964   intestine leaks/bladder repair  . CARDIOVASCULAR STRESS TEST  11/29/2003   EF 64%  . CARDIOVASCULAR STRESS TEST  11/17/2012   No evidence of ischemia; EF 66%; inferior wall attenuation favored to rep diaphragmatic attenuation  . CATARACT EXTRACTION W/ INTRAOCULAR LENS  IMPLANT, BILATERAL     2013  . ILEOSTOMY  04/16/2012   Procedure: ILEOSTOMY;  Surgeon: Adin Hector, MD;  Location: [facility];  Service: General;  Laterality: N/A;  . ILEOSTOMY CLOSURE  12/07/2012  . ILEOSTOMY CLOSURE  12/07/2012   Procedure: ILEOSTOMY TAKEDOWN;  Surgeon: Adin Hector, MD;  Location: [facility];  Service: General;  Laterality: N/A;  . LAPAROTOMY  04/16/2012   Procedure: EXPLORATORY LAPAROTOMY;  Surgeon: Adin Hector, MD;  Location: [facility];  Service: General;  Laterality: N/A;  . LYSIS OF ADHESION  12/07/2012   Procedure: LYSIS OF ADHESION;  Surgeon: Adin Hector, MD;  Location: [facility];  Service: General;  Laterality: N/A;  . PROCTOSCOPY  12/07/2012   Procedure: PROCTOSCOPY;  Surgeon: Adin Hector, MD;  Location: [facility];  Service: General;;  ILEO PROCTOSCOPY  . STOMACH  SURGERY  1959   ulcer  . TONSILLECTOMY    . US ECHOCARDIOGRAPHY  05/20/2010   EF 55-60%  . US ECHOCARDIOGRAPHY  10/23/2005   EF 55-60%       Family History  Problem Relation Age of Onset  . Heart disease Maternal Grandfather   . Heart disease Other        maternal side  . Colon cancer Neg Hx     Social History   Tobacco Use  . Smoking status: Former Smoker    Packs/day: 1.00    Years: 2.00    Pack years: 2.00    Quit date: 11/10/1964    Years since quitting: 55.2  . Smokeless tobacco: Never Used  Substance Use Topics  .  Alcohol use: No  . Drug use: No    Home Medications Prior to Admission medications   Medication Sig Start Date End Date Taking? Authorizing Provider  aspirin EC 81 MG tablet Take 81 mg by mouth. M, W, F only    [provider]  azaTHIOprine (IMURAN) 50 MG tablet Take 50 mg by mouth daily.    [provider]  ergocalciferol (VITAMIN D2) 50000 UNITS capsule Take 50,000 Units by mouth once a week.     [provider]  fenofibrate (LOFIBRA) 160 MG tablet Take 160 mg by mouth daily.    [provider]  IRON PO Take 2 tablets by mouth daily. Iron 65mg     [provider]  levothyroxine (SYNTHROID) 25 MCG tablet Take 25 mcg by mouth daily before breakfast.    [provider]  loperamide (IMODIUM) 2 MG capsule Take 2 mg by mouth daily as needed for diarrhea or loose stools.    [provider]  metoprolol (LOPRESSOR) 50 MG tablet Take 0.5 tablets (25 mg total) by mouth daily. 07/14/16   [name], MD  Multiple Vitamin (MULTI VITAMIN DAILY PO) Take 1 tablet by mouth daily.    [provider]  Multiple Vitamins-Minerals (PRESERVISION AREDS 2 PO) Take 2 tablets by mouth daily.    [provider]  testosterone cypionate (DEPOTESTOSTERONE CYPIONATE) 200 MG/ML injection Inject 1 mL into the muscle every 21 ( twenty-one) days. 10/10/15   [provider]  Wheat Dextrin (BENEFIBER DRINK MIX PO) Take 1 Dose by mouth 3 (three) times daily as needed (when not eating a high fiber diet).     [provider]    Allergies    Patient has no known allergies.  Review of Systems   Review of Systems  Constitutional: Negative for chills and fever.  Respiratory: Positive for shortness of breath.   Cardiovascular: Negative for chest pain and leg swelling.  Gastrointestinal: Negative for abdominal pain, nausea and vomiting.  Neurological: Positive for light-headedness (occasional). Negative for syncope.  All other systems  reviewed and are negative.   Physical Exam Updated Vital Signs BP (!) 144/83 (BP Location: Left Arm)   Pulse (!) 57   Temp 97.9 F (36.6 C) (Oral)   Resp (!) 22   Ht 5\' 9"  (1.753 m)   Wt 68 kg   SpO2 100%   BMI 22.15 kg/m   Physical Exam Vitals and nursing note reviewed.  Constitutional:      General: He is not in acute distress.    Appearance: He is well-developed.  HENT:     Head: Normocephalic and atraumatic.  Eyes:     General:        Right eye: No discharge.  Left eye: No discharge.     Conjunctiva/sclera: Conjunctivae normal.  Neck:     Vascular: No JVD.     Trachea: No tracheal deviation.  Cardiovascular:     Rate and Rhythm: Normal rate and regular rhythm.     Pulses: Normal pulses.     Comments: 2+ radial and DP/PT pulses bilaterally, Homans sign absent bilaterally, no lower extremity edema, no palpable cords, compartments are soft  Pulmonary:     Effort: Pulmonary effort is normal.     Breath sounds: Decreased breath sounds present.     Comments: Speaking in short phrases, SpO2 saturations 100% on room air at rest. Diminished breath sounds mostly on the right.  Abdominal:     General: There is no distension.  Musculoskeletal:     Right lower leg: No tenderness. No edema.     Left lower leg: No tenderness. No edema.  Skin:    General: Skin is warm and dry.     Findings: No erythema.  Neurological:     Mental Status: He is alert.  Psychiatric:        Behavior: Behavior normal.     ED Results / Procedures / Treatments   Labs (all labs ordered are listed, but only abnormal results are displayed) Labs Reviewed  BASIC METABOLIC PANEL - Abnormal; Notable for the following components:      Result Value   CO2 19 (*)    Glucose, Bld 100 (*)    Creatinine, Ser 1.32 (*)    GFR calc non Af Amer 51 (*)    GFR calc Af Amer 59 (*)    All other components within normal limits  CBC WITH DIFFERENTIAL/PLATELET - Abnormal; Notable for the following  components:   Lymphs Abs 0.3 (*)    All other components within normal limits  BRAIN NATRIURETIC PEPTIDE  D-DIMER, QUANTITATIVE (NOT AT [facility])  TROPONIN I (HIGH SENSITIVITY)    EKG EKG Interpretation  Date/Time:  Monday February 13 2020 11:58:27 EDT Ventricular Rate:  66 PR Interval:    QRS Duration: 87 QT Interval:  402 QTC Calculation: 422 R Axis:   20 Text Interpretation: Sinus rhythm Abnormal R-wave progression, early transition Confirmed by Madalyn Rob [phone]) on 02/13/2020 1:15:10 PM   Radiology DG Chest 2 View  Result Date: 02/13/2020 CLINICAL DATA:  Shortness of breath for 1 month EXAM: CHEST - 2 VIEW COMPARISON:  July 14, 2016 FINDINGS: The heart size and mediastinal contours are within normal limits. Both lungs are clear. The visualized skeletal structures are unremarkable. IMPRESSION: No active cardiopulmonary disease. Electronically Signed   By: Prudencio Pair M.D.   On: 02/13/2020 12:38    Procedures Procedures (including critical care time)  Medications Ordered in ED Medications - No data to display  ED Course  I have reviewed the triage vital signs and the nursing notes.  Pertinent labs & imaging results that were available during my care of the patient were reviewed by me and considered in my medical decision making (see chart for details).    MDM Rules/Calculators/A&P                      Patient presenting for evaluation of shortness of breath for 1 month.  He primarily notes dyspnea on exertion and generalized fatigue.  He is afebrile in the ED, vital signs are stable.  He is nontoxic in appearance.  He has no chest pain.  His EKG shows normal sinus rhythm, no acute ischemic  abnormalities.  A troponin was obtained which was within normal limits and I do not feel that he requires serial troponin testing as he has no complaint of chest pain and his symptoms are atypical for cardiac etiology.  Chest x-ray was obtained which shows no acute cardiopulmonary  abnormalities, no evidence of edema, consolidation, or pneumothorax.  Lab work reviewed and interpreted by myself shows no leukocytosis, no anemia, no metabolic derangements, kidney function at baseline.  D-dimer is negative and I have a low suspicion of PE at this time.  BNP is within normal limits coupled with normal oxygen levels and no cardiomegaly or edema on chest x-ray is reassuring that he does not have CHF.  The patient was ambulated in the ED with stable SPO2 saturations.  Doubt dissection, cardiac tamponade, esophageal rupture.   On reevaluation the patient is resting comfortably in no apparent distress.  No signs of respiratory distress on reassessment.  He is speaking in full sentences without difficulty.  He has a pulse oximeter at home.  He has been fully vaccinated against the Covid virus and has had no known sick encounters.  He declines Covid testing at this time which I think is reasonable.  He will follow up with his PCP and his cardiologist for outpatient follow-up, possibly for thyroid testing and echocardiogram.  Discussed strict ED return precautions.  Patient and wife verbalized understanding of and agreement with plan and patient is stable for discharge at this time.  Patient was seen and evaluated by Dr. Roslynn Amble who agrees with assessment and plan at this time.  Final Clinical Impression(s) / ED Diagnoses Final diagnoses:  Shortness of breath  Fatigue, unspecified type    Rx / DC Orders ED Discharge Orders    None       Renita Papa, PA-C 02/13/20 1555    Lucrezia Starch, MD 02/14/20 [phone]

## 2020-02-13 NOTE — ED Notes (Signed)
Pt ambulated independently on pulse ox and maintained 100% SP02

## 2020-02-17 ENCOUNTER — Ambulatory Visit: Payer: PPO | Admitting: Cardiology

## 2020-02-17 ENCOUNTER — Encounter: Payer: Self-pay | Admitting: Cardiology

## 2020-02-17 ENCOUNTER — Other Ambulatory Visit: Payer: Self-pay

## 2020-02-17 VITALS — BP 142/77 | HR 71 | Temp 98.5°F | Resp 16 | Ht 69.0 in | Wt 161.3 lb

## 2020-02-17 DIAGNOSIS — I480 Paroxysmal atrial fibrillation: Secondary | ICD-10-CM | POA: Diagnosis not present

## 2020-02-17 DIAGNOSIS — E7800 Pure hypercholesterolemia, unspecified: Secondary | ICD-10-CM | POA: Diagnosis not present

## 2020-02-17 DIAGNOSIS — L03012 Cellulitis of left finger: Secondary | ICD-10-CM | POA: Diagnosis not present

## 2020-02-17 DIAGNOSIS — N1831 Chronic kidney disease, stage 3a: Secondary | ICD-10-CM

## 2020-02-17 DIAGNOSIS — R0609 Other forms of dyspnea: Secondary | ICD-10-CM

## 2020-02-17 DIAGNOSIS — E119 Type 2 diabetes mellitus without complications: Secondary | ICD-10-CM

## 2020-02-17 DIAGNOSIS — R9431 Abnormal electrocardiogram [ECG] [EKG]: Secondary | ICD-10-CM | POA: Diagnosis not present

## 2020-02-17 NOTE — Progress Notes (Signed)
Primary Physician:  Leanna Battles, MD   Patient ID: Nicholas [name], male    DOB: 11/28/1940, 79 y.o.   MRN: [id number]  Subjective:    Chief Complaint  Patient presents with  . Shortness of Breath  . Atrial Fibrillation  . Follow-up    from ER    HPI: MACYN HAEGER  is a 79 y.o. male  with chronic dyspnea on exertion, dyslipidemia, diet-controlled diabetes mellitus, Crohn's disease on Aziothioprine,  admitted to [facility] in January 2014 when he had reversal of ileostomy postoperatively on 11/12/2012 had wide complex tachycardia and also narrow complex tachycardia suggestive of PSVT and also A. Flutter with abberancy. He has diagnosis of paroxysmal atrial fibrillation by history in January 2005, He is only on ASA therapy per patient choice.  Seen the emergency room on 02/13/2020 with marked shortness of breath with exertion activity that started about a month ago.  Chest x-ray was normal, labs within normal limits he was discharged home.  He now presents for follow-up on the ED visit.  Continues to have marked dyspnea on exertion.  No PND orthopnea, no leg edema.  He has not had any recurrence of atrial fibrillation leg symptoms or palpitations.  He endorses epigastric discomfort that comes between meals and feels better after he takes a small snack.  Last year he was evaluated for weight loss which is stabilized.  Past Medical History:  Diagnosis Date  . Anemia    PO IRON   . Aortic stenosis, mild   . Cataract   . Colonic hemorrhage 1964  . Crohn's disease ([place])   . GERD (gastroesophageal reflux disease)   . Heart murmur   . Hyperlipidemia   . Macular degeneration   . PAC (premature atrial contraction)   . PAF (paroxysmal atrial fibrillation) ([place])    SEES DR. BRACKBILL  . Palpitations     Past Surgical History:  Procedure Laterality Date  . APPENDECTOMY    . Biopsy Facial  05/10/2019  . BLADDER REPAIR  1964   intestine leaks/bladder repair  . CARDIOVASCULAR  STRESS TEST  11/29/2003   EF 64%  . CARDIOVASCULAR STRESS TEST  11/17/2012   No evidence of ischemia; EF 66%; inferior wall attenuation favored to rep diaphragmatic attenuation  . CATARACT EXTRACTION W/ INTRAOCULAR LENS  IMPLANT, BILATERAL     2013  . ILEOSTOMY  04/16/2012   Procedure: ILEOSTOMY;  Surgeon: Adin Hector, MD;  Location: [facility];  Service: General;  Laterality: N/A;  . ILEOSTOMY CLOSURE  12/07/2012  . ILEOSTOMY CLOSURE  12/07/2012   Procedure: ILEOSTOMY TAKEDOWN;  Surgeon: Adin Hector, MD;  Location: [facility];  Service: General;  Laterality: N/A;  . LAPAROTOMY  04/16/2012   Procedure: EXPLORATORY LAPAROTOMY;  Surgeon: Adin Hector, MD;  Location: [facility];  Service: General;  Laterality: N/A;  . LYSIS OF ADHESION  12/07/2012   Procedure: LYSIS OF ADHESION;  Surgeon: Adin Hector, MD;  Location: [employer];  Service: General;  Laterality: N/A;  . PROCTOSCOPY  12/07/2012   Procedure: PROCTOSCOPY;  Surgeon: Adin Hector, MD;  Location: [facility];  Service: General;;  ILEO PROCTOSCOPY  . STOMACH SURGERY  1959   ulcer  . TONSILLECTOMY    . US ECHOCARDIOGRAPHY  05/20/2010   EF 55-60%  . US ECHOCARDIOGRAPHY  10/23/2005   EF 55-60%   Social History   Tobacco Use  . Smoking status: Former Smoker    Packs/day: 1.00    Years: 2.00  Pack years: 2.00    Quit date: 11/10/1964    Years since quitting: 55.3  . Smokeless tobacco: Never Used  Substance Use Topics  . Alcohol use: No   Marital Status: Married   Review of Systems  Constitution: Negative for weight loss.  Cardiovascular: Positive for chest pain and dyspnea on exertion. Negative for leg swelling.  Gastrointestinal: Negative for melena.   Objective:  Blood pressure (!) 142/77, pulse 71, temperature 98.5 F (36.9 C), temperature source Temporal, resp. rate 16, height 5\' 9"  (1.753 m), weight 161 lb 4.8 oz (73.2 kg), SpO2 99 %. Body mass index is 23.82 kg/m.  Vitals with BMI 02/17/2020 02/13/2020 02/13/2020  Height 5\' 9"  - -   Weight 161 lbs 5 oz - -  BMI [id number] - -  Systolic [id number] -  Diastolic 77 81 -  Pulse 71 57 53    Physical Exam  Constitutional: Vital signs are normal. He appears well-developed and well-nourished.  HENT:  Head: Normocephalic.  Cardiovascular: Normal rate, regular rhythm and intact distal pulses. Exam reveals a midsystolic click.  Murmur heard.  Mid to late systolic murmur is present with a grade of 2/6 at the apex. Pulmonary/Chest: Effort normal and breath sounds normal. No accessory muscle usage. No respiratory distress.  Abdominal: Soft. Bowel sounds are normal.  Vitals reviewed.  Radiology: No results found.  Laboratory examination:    CMP Latest Ref Rng & Units 02/13/2020 07/14/2016 02/24/2015  Glucose 70 - 99 mg/dL 100(H) 109(H) 92  BUN 8 - 23 mg/dL 19 10 11   Creatinine 0.61 - 1.24 mg/dL 1.32(H) 1.32(H) 1.38(H)  Sodium 135 - 145 mmol/L 141 135 137  Potassium 3.5 - 5.1 mmol/L 4.5 4.0 3.8  Chloride 98 - 111 mmol/L 108 107 107  CO2 22 - 32 mmol/L 19(L) 24 19  Calcium 8.9 - 10.3 mg/dL 10.1 8.7(L) 8.0(L)  Total Protein 6.0 - 8.3 g/dL - - 5.2(L)  Total Bilirubin 0.3 - 1.2 mg/dL - - 0.7  Alkaline Phos 39 - 117 U/L - - 41  AST 0 - 37 U/L - - 23  ALT 0 - 53 U/L - - 14   CBC Latest Ref Rng & Units 02/13/2020 07/14/2016 02/24/2015  WBC 4.0 - 10.5 K/uL 7.3 4.9 8.1  Hemoglobin 13.0 - 17.0 g/dL 14.9 13.9 12.3(L)  Hematocrit 39.0 - 52.0 % 45.0 42.5 36.8(L)  Platelets 150 - 400 K/uL 282 205 158   External labs: Cholesterol, total 139.000 m 09/26/2019 Triglycerides 111.000 09/26/2019 HDL 50 MG/DL 09/26/2019 LDL 67.000 mg 09/26/2019  A1C 5.300 % 09/26/2019; TSH 5.080 09/26/2019  PRN Meds:. There are no discontinued medications. Current Meds  Medication Sig  . aspirin EC 81 MG tablet Take 81 mg by mouth. M, W, F only  . azaTHIOprine (IMURAN) 50 MG tablet Take 50 mg by mouth daily.  . ergocalciferol (VITAMIN D2) 50000 UNITS capsule Take 50,000 Units by mouth once a week.   .  fenofibrate (LOFIBRA) 160 MG tablet Take 160 mg by mouth daily.  . IRON PO Take 2 tablets by mouth daily. Iron 65mg   . levothyroxine (SYNTHROID) 25 MCG tablet Take 25 mcg by mouth daily before breakfast.  . loperamide (IMODIUM) 2 MG capsule Take 2 mg by mouth daily as needed for diarrhea or loose stools.  . metoprolol (LOPRESSOR) 50 MG tablet Take 0.5 tablets (25 mg total) by mouth daily.  . Multiple Vitamin (MULTI VITAMIN DAILY PO) Take 1 tablet by mouth daily.  . Multiple Vitamins-Minerals (PRESERVISION AREDS  2 PO) Take 2 tablets by mouth daily.  [name] testosterone cypionate (DEPOTESTOSTERONE CYPIONATE) 200 MG/ML injection Inject 1 mL into the muscle every 21 ( twenty-one) days.  . Wheat Dextrin (BENEFIBER DRINK MIX PO) Take 1 Dose by mouth 3 (three) times daily as needed (when not eating a high fiber diet).     Cardiac Studies:   Telemetry 12/10/2012: Wide-complex tachycardia with retrograde "P" suspect AVRT with RVR @150 /min. Also had narrow complex tachy for 15 minutes that again is suspicious for A. Fl with 2:1 conduction vs atrial tachycardia vs PSVT.  Event Monitor [09/16/2017]: Sinus rhythm, heart rate 37-126 bpm. symptoms of fatigue with sinus rhythm at 80 bpm. No atrial fibrillation, atrial flutter  Nuclear stress test [09/21/2017]: 1. The patient performed treadmill exercise using a Bruce protocol, completing 07:15 min. The patient did not experience symptoms other than fatigue during the procedure. The patient completed an estimated workload of 8.96 METS. Resting blood pressure was 152/82 mmHg and peak effect blood pressure was 206/56 mmHg. 2. Perfusion study is normal without ischemia or scar and normal LVEF. Low risk study.  Echocardiogram [09/28/2017]: Left ventricle cavity is normal in size. Normal global wall motion. Visual EF is 50-55%. Normal diastolic filling pattern. No significant valvular abnormalities. No evidence of pulmonary hypertension.  EKG:  EKG 02/17/2020: Normal  sinus rhythm at rate of 65 bpm, left axis deviation, left anterior fascicular block.  Incomplete right bundle branch block.  Nonspecific T abnormality. Compared to EKG 02/13/2020 and 12/01/2019, previously normal EKG.   EKG 11/30/2019: Sinus bradycardia at 56 bpm, normal axis, no evidence of ischemia.  Assessment:     ICD-10-CM   1. Paroxysmal atrial fibrillation (HCC)  I48.0 EKG 12-Lead    PCV ECHOCARDIOGRAM COMPLETE    PCV MYOCARDIAL PERFUSION WITH LEXISCAN    PCV MYOCARDIAL PERFUSION WO LEXISCAN  2. Dyspnea on exertion  R06.00 PCV ECHOCARDIOGRAM COMPLETE    PCV MYOCARDIAL PERFUSION WITH LEXISCAN    PCV MYOCARDIAL PERFUSION WO LEXISCAN  3. Nonspecific abnormal electrocardiogram (ECG) (EKG)  R94.31   4. Diet-controlled diabetes mellitus ([place])  E11.9   5. Hypercholesteremia  E78.00   6. Stage 3a chronic kidney disease  N18.31    Orders Placed This Encounter  Procedures  . PCV MYOCARDIAL PERFUSION WITH LEXISCAN    Standing Status:   Future    Standing Expiration Date:   05/18/2021  . PCV MYOCARDIAL PERFUSION WO LEXISCAN    Standing Status:   Future    Standing Expiration Date:   05/18/2021  . EKG 12-Lead  . PCV ECHOCARDIOGRAM COMPLETE    Standing Status:   Future    Standing Expiration Date:   02/16/2021   Recommendations:   JUWAAN SPOLAR  is a 79 y.o. male  with chronic dyspnea on exertion, dyslipidemia, diet-controlled diabetes mellitus, Crohn's disease on Aziothioprine,  admitted to [facility] in January 2014 when he had reversal of ileostomy postoperatively on 11/12/2012 had wide complex tachycardia and also narrow complex tachycardia suggestive of PSVT and also A. Flutter with abberancy. He has diagnosis of paroxysmal atrial fibrillation by history in January 2005, He is only on ASA therapy per patient choice.  Seen the emergency room on 02/13/2020 with marked shortness of breath with exertion activity that started about a month ago.  I am concerned about new EKG abnormality, he  does have high risk features including diet-controlled diabetes, hyperlipidemia are well controlled, stage III chronic kidney disease and hypertension.  Schedule for a nuclear stress test to  evaluate for myocardial ischemia. Will schedule for an echocardiogram. Office visit following the work-up/investigations.  I have reviewed his medical records from ED visit and also his external labs.   Patient is on testosterone supplements, although chest x-ray was normal, if cardiac work-up is negative, he will need CT angiogram of the chest to exclude pulmonary embolism.  His labs from the emergency room are reviewed and updated.  Advised him to take it easy for now, to contact me if symptoms are getting worse.  Continue aspirin for now.  Adrian Prows, MD, [facility] 02/17/2020, 3:16 PM [facility] [place] Office: [phone]

## 2020-02-28 ENCOUNTER — Other Ambulatory Visit: Payer: Self-pay

## 2020-02-28 ENCOUNTER — Ambulatory Visit: Payer: PPO

## 2020-02-28 DIAGNOSIS — I480 Paroxysmal atrial fibrillation: Secondary | ICD-10-CM | POA: Diagnosis not present

## 2020-02-28 DIAGNOSIS — R0609 Other forms of dyspnea: Secondary | ICD-10-CM | POA: Diagnosis not present

## 2020-02-29 ENCOUNTER — Ambulatory Visit: Payer: PPO

## 2020-02-29 ENCOUNTER — Other Ambulatory Visit: Payer: PPO

## 2020-02-29 DIAGNOSIS — I480 Paroxysmal atrial fibrillation: Secondary | ICD-10-CM

## 2020-02-29 DIAGNOSIS — R0609 Other forms of dyspnea: Secondary | ICD-10-CM

## 2020-03-06 DIAGNOSIS — K760 Fatty (change of) liver, not elsewhere classified: Secondary | ICD-10-CM | POA: Diagnosis not present

## 2020-03-06 DIAGNOSIS — K838 Other specified diseases of biliary tract: Secondary | ICD-10-CM | POA: Diagnosis not present

## 2020-03-06 DIAGNOSIS — K862 Cyst of pancreas: Secondary | ICD-10-CM | POA: Diagnosis not present

## 2020-03-12 NOTE — Progress Notes (Deleted)
Primary Physician:  Leanna Battles, MD   Patient ID: Nicholas [name], male    DOB: 01-Nov-1941, 79 y.o.   MRN: [id number]  No chief complaint on file.   HPI   Nicholas [name]  is a 79 y.o. male  with chronic dyspnea on exertion, dyslipidemia, diet-controlled diabetes mellitus, Crohn's disease on Aziothioprine,  admitted to [facility] in January 2014 when he had reversal of ileostomy postoperatively on 11/12/2012 had wide complex tachycardia and also narrow complex tachycardia suggestive of PSVT and also A. Flutter with abberancy. He has diagnosis of paroxysmal atrial fibrillation by history in January 2005, He is only on ASA therapy per patient choice. *** Seen the emergency room on 02/13/2020 with marked shortness of breath with exertion activity that started about a month ago.  Chest x-ray was normal, labs within normal limits he was discharged home.  He now presents for follow-up on the ED visit.  Continues to have marked dyspnea on exertion.  No PND orthopnea, no leg edema.  He has not had any recurrence of atrial fibrillation leg symptoms or palpitations.  He endorses epigastric discomfort that comes between meals and feels better after he takes a small snack.  Last year he was evaluated for weight loss which is stabilized.  Past Medical History:  Diagnosis Date  . Anemia    PO IRON   . Aortic stenosis, mild   . Cataract   . Colonic hemorrhage 1964  . Crohn's disease ([place])   . GERD (gastroesophageal reflux disease)   . Heart murmur   . Hyperlipidemia   . Macular degeneration   . PAC (premature atrial contraction)   . PAF (paroxysmal atrial fibrillation) ([place])    SEES DR. BRACKBILL  . Palpitations    Past Surgical History:  Procedure Laterality Date  . APPENDECTOMY    . Biopsy Facial  05/10/2019  . BLADDER REPAIR  1964   intestine leaks/bladder repair  . CARDIOVASCULAR STRESS TEST  11/29/2003   EF 64%  . CARDIOVASCULAR STRESS TEST  11/17/2012   No evidence of ischemia; EF  66%; inferior wall attenuation favored to rep diaphragmatic attenuation  . CATARACT EXTRACTION W/ INTRAOCULAR LENS  IMPLANT, BILATERAL     2013  . ILEOSTOMY  04/16/2012   Procedure: ILEOSTOMY;  Surgeon: Adin Hector, MD;  Location: [place];  Service: General;  Laterality: N/A;  . ILEOSTOMY CLOSURE  12/07/2012  . ILEOSTOMY CLOSURE  12/07/2012   Procedure: ILEOSTOMY TAKEDOWN;  Surgeon: Adin Hector, MD;  Location: [place];  Service: General;  Laterality: N/A;  . LAPAROTOMY  04/16/2012   Procedure: EXPLORATORY LAPAROTOMY;  Surgeon: Adin Hector, MD;  Location: [place];  Service: General;  Laterality: N/A;  . LYSIS OF ADHESION  12/07/2012   Procedure: LYSIS OF ADHESION;  Surgeon: Adin Hector, MD;  Location: [place];  Service: General;  Laterality: N/A;  . PROCTOSCOPY  12/07/2012   Procedure: PROCTOSCOPY;  Surgeon: Adin Hector, MD;  Location: [place];  Service: General;;  ILEO PROCTOSCOPY  . STOMACH SURGERY  1959   ulcer  . TONSILLECTOMY    . US ECHOCARDIOGRAPHY  05/20/2010   EF 55-60%  . US ECHOCARDIOGRAPHY  10/23/2005   EF 55-60%   Family History  Problem Relation Age of Onset  . Heart disease Maternal Grandfather   . Heart disease Other        maternal side  . Congestive Heart Failure Daughter   . Colon cancer Neg Hx  Social History   Tobacco Use  . Smoking status: Former Smoker    Packs/day: 1.00    Years: 2.00    Pack years: 2.00    Quit date: 11/10/1964    Years since quitting: 55.3  . Smokeless tobacco: Never Used  Substance Use Topics  . Alcohol use: No    Marital Status: Married   ROS:   Review of Systems  Constitution: Negative for weight loss.  Cardiovascular: Positive for chest pain and dyspnea on exertion. Negative for leg swelling.  Gastrointestinal: Negative for melena.   Objective:  There were no vitals taken for this visit. There is no height or weight on file to calculate BMI.  Vitals with BMI 02/17/2020 02/13/2020 02/13/2020  Height '5\' 9"'  - -    Weight 161 lbs 5 oz - -  BMI 78.93 - -  Systolic 810 175 -  Diastolic 77 81 -  Pulse 71 57 53    Physical Exam  Constitutional: Vital signs are normal. He appears well-developed and well-nourished.  HENT:  Head: Normocephalic.  Cardiovascular: Normal rate, regular rhythm and intact distal pulses. Exam reveals a midsystolic click.  Murmur heard.  Mid to late systolic murmur is present with a grade of 2/6 at the apex. Pulmonary/Chest: Effort normal and breath sounds normal. No accessory muscle usage. No respiratory distress.  Abdominal: Soft. Bowel sounds are normal.  Vitals reviewed.  Radiology: No results found.  Laboratory examination:   CrCl cannot be calculated (Patient's most recent lab result is older than the maximum 21 days allowed.).  CMP Latest Ref Rng & Units 02/13/2020 07/14/2016 02/24/2015  Glucose 70 - 99 mg/dL 100(H) 109(H) 92  BUN 8 - 23 mg/dL '19 10 11  ' Creatinine 0.61 - 1.24 mg/dL 1.32(H) 1.32(H) 1.38(H)  Sodium 135 - 145 mmol/L 141 135 137  Potassium 3.5 - 5.1 mmol/L 4.5 4.0 3.8  Chloride 98 - 111 mmol/L 108 107 107  CO2 22 - 32 mmol/L 19(L) 24 19  Calcium 8.9 - 10.3 mg/dL 10.1 8.7(L) 8.0(L)  Total Protein 6.0 - 8.3 g/dL - - 5.2(L)  Total Bilirubin 0.3 - 1.2 mg/dL - - 0.7  Alkaline Phos 39 - 117 U/L - - 41  AST 0 - 37 U/L - - 23  ALT 0 - 53 U/L - - 14   CBC Latest Ref Rng & Units 02/13/2020 07/14/2016 02/24/2015  WBC 4.0 - 10.5 K/uL 7.3 4.9 8.1  Hemoglobin 13.0 - 17.0 g/dL 14.9 13.9 12.3(L)  Hematocrit 39.0 - 52.0 % 45.0 42.5 36.8(L)  Platelets 150 - 400 K/uL 282 205 158   External labs:  BNP (last 3 results) Recent Labs    02/13/20 1210  BNP 65.2   01/05/2020:  HA1C 5.5% Albumin 4.2, Total Bilirubin 0.4, Bilirubin, Direct 0.1, Alkaline Phosphatase 57, AST 22, ALT 13, Total Protein 6.3.  12/28/2019:  BUN 20, Creatinine 1.21, Na/K 137/4.7, Glucose 167, eGFR 56,  Hemoglobin 13.4, MCV 97.9, RBC 4.04, WBC 7.7.   Cholesterol, total 139.000 m  09/26/2019 Triglycerides 111.000 09/26/2019 HDL 50 MG/DL 09/26/2019 LDL 67.000 mg 09/26/2019  A1C 5.300 % 09/26/2019; TSH 5.080 09/26/2019   Radiology:   Chest X-Ray 02/13/2020: No active cardiopulmonary disease.  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 Also had narrow complex tachy for 15 minutes that again is suspicious for A. Fl with 2:1 conduction vs atrial tachycardia vs PSVT.  Event Monitor [09/16/2017]: Sinus rhythm, heart rate 37-126 bpm. symptoms of  fatigue with sinus rhythm at 80 bpm. No atrial fibrillation, atrial flutter  Nuclear stress test [09/21/2017]: 1. The patient performed treadmill exercise using a Bruce protocol, completing 07:15 min. The patient did not experience symptoms other than fatigue during the procedure. The patient completed an estimated workload of 8.96 METS. Resting blood pressure was 152/82 mmHg and peak effect blood pressure was 206/56 mmHg. 2. Perfusion study is normal without ischemia or scar and normal LVEF. Low risk study.  Echocardiogram [09/28/2017]: Left ventricle cavity is normal in size. Normal global wall motion. Visual EF is 50-55%. Normal diastolic filling pattern. No significant valvular abnormalities. No evidence of pulmonary hypertension.  *** Echocardiogram 02/28/2020:  Normal LV systolic function with visual EF 50-55%. Left ventricle cavity is normal in size. Moderate left ventricular hypertrophy. Normal global wall motion. Normal diastolic filling pattern, normal LAP.  Structurally normal mitral valve. No evidence of mitral stenosis. Mild (Grade I) mitral regurgitation.  Structurally normal tricuspid valve. No evidence of tricuspid stenosis.  Mild tricuspid regurgitation.  Compared to previous study dated 09/28/2017 Mild MR and TR are new. LVEF remains stable.  Exercise tetrofosmin stress test  02/29/2020: Normal ECG stress. The baseline blood pressure  was 172/90 mmHg and increased to 208/80 mmHg, which is a hypertensive response to exercise. The patient exercised for 4 minutes and 45 seconds of a Bruce protocol, achieving approximately 7 METs.  Myocardial perfusion is normal. Overall LV systolic function is normal without regional wall motion abnormalities. Stress LV EF: 71%.  Compared to 09/21/2017, no significant change.  EKG: *** 02/17/2020: Normal sinus rhythm at rate of 65 bpm, left axis deviation, left anterior fascicular block.  Incomplete right bundle branch block.  Nonspecific T abnormality. Compared to EKG 02/13/2020 and 12/01/2019, previously normal EKG.   11/30/2019: Sinus bradycardia at 56 bpm, normal axis, no evidence of ischemia.  Assessment:     ICD-10-CM   1. Paroxysmal atrial fibrillation (HCC)  I48.0   2. Dyspnea on exertion  R06.00   3. Nonspecific abnormal electrocardiogram (ECG) (EKG)  R94.31   4. Diet-controlled diabetes mellitus ([place])  E11.9   5. Hypercholesteremia  E78.00   6. Stage 3a chronic kidney disease  N18.31      No orders of the defined types were placed in this encounter.  There are no discontinued medications.  Recommendations:   CAMRAN KEADY  is a 79 y.o. male  with chronic dyspnea on exertion, dyslipidemia, diet-controlled diabetes mellitus, Crohn's disease on Aziothioprine,  admitted to [facility] in January 2014 when he had reversal of ileostomy postoperatively on 11/12/2012 had wide complex tachycardia and also narrow complex tachycardia suggestive of PSVT and also A. Flutter with abberancy. He has diagnosis of paroxysmal atrial fibrillation by history in January 2005, He is only on ASA therapy per patient choice. *** Seen the emergency room on 02/13/2020 with marked shortness of breath with exertion activity that started about a month ago.  I am concerned about new EKG abnormality, he does have high risk features including diet-controlled diabetes, hyperlipidemia are well controlled, stage  III chronic kidney disease and hypertension.  Schedule for a nuclear stress test to evaluate for myocardial ischemia. Will schedule for an echocardiogram. Office visit following the work-up/investigations.  I have reviewed his medical records from ED visit and also his external labs.   Patient is on testosterone supplements, although chest x-ray was normal, if cardiac work-up is negative, he will need CT angiogram of the chest to exclude pulmonary embolism.  His labs from  the emergency room are reviewed and updated.  Advised him to take it easy for now, to contact me if symptoms are getting worse.  Continue aspirin for now.   Adrian Prows, MD, [facility] 03/14/2020, 1:50 PM [facility] Pager: [phone] Office: [phone]

## 2020-03-14 ENCOUNTER — Ambulatory Visit: Payer: PPO | Admitting: Cardiology

## 2020-03-29 DIAGNOSIS — K862 Cyst of pancreas: Secondary | ICD-10-CM | POA: Diagnosis not present

## 2020-03-29 DIAGNOSIS — K5080 Crohn's disease of both small and large intestine without complications: Secondary | ICD-10-CM | POA: Diagnosis not present

## 2020-03-29 DIAGNOSIS — K50813 Crohn's disease of both small and large intestine with fistula: Secondary | ICD-10-CM | POA: Diagnosis not present

## 2020-03-29 DIAGNOSIS — K861 Other chronic pancreatitis: Secondary | ICD-10-CM | POA: Diagnosis not present

## 2020-03-30 DIAGNOSIS — R933 Abnormal findings on diagnostic imaging of other parts of digestive tract: Secondary | ICD-10-CM | POA: Diagnosis not present

## 2020-03-30 DIAGNOSIS — K50813 Crohn's disease of both small and large intestine with fistula: Secondary | ICD-10-CM | POA: Diagnosis not present

## 2020-04-05 DIAGNOSIS — K862 Cyst of pancreas: Secondary | ICD-10-CM | POA: Diagnosis not present

## 2020-04-05 DIAGNOSIS — K50813 Crohn's disease of both small and large intestine with fistula: Secondary | ICD-10-CM | POA: Diagnosis not present

## 2020-04-05 DIAGNOSIS — R935 Abnormal findings on diagnostic imaging of other abdominal regions, including retroperitoneum: Secondary | ICD-10-CM | POA: Diagnosis not present

## 2020-04-05 DIAGNOSIS — K861 Other chronic pancreatitis: Secondary | ICD-10-CM | POA: Diagnosis not present

## 2020-04-11 NOTE — Progress Notes (Signed)
Primary Physician:  Leanna Battles, MD   Patient ID: Nicholas [name], male    DOB: 1941/01/11, 78 y.o.   MRN: [id number]  Chief Complaint  Patient presents with  . Follow-up    4 weeks  . Shortness of Breath  . Results    HPI   Nicholas [name]  is a 79 y.o. male  with chronic dyspnea on exertion, dyslipidemia, diet-controlled diabetes mellitus, Crohn's disease on Aziothioprine,  admitted to [facility] in January 2014 when he had reversal of ileostomy postoperatively on 11/12/2012 had wide complex tachycardia and also narrow complex tachycardia suggestive of PSVT and also A. Flutter with abberancy. He has diagnosis of paroxysmal atrial fibrillation by history in January 2005, He is only on ASA therapy per patient choice.   He was evaluated in the emergency room on 02/13/2020 with marked dyspnea that started about a month ago. He endorses epigastric discomfort that comes between meals and feels better after he takes a small snack.  He underwent echocardiogram and stress testing and presents for follow-up.  Fortunately states that symptoms are essentially resolved.  He has resumed all his activities.  He has not had any chest discomfort abdominal discomfort.  Past Medical History:  Diagnosis Date  . Anemia    PO IRON   . Aortic stenosis, mild   . Cataract   . Colonic hemorrhage 1964  . Crohn's disease ([place])   . GERD (gastroesophageal reflux disease)   . Heart murmur   . Hyperlipidemia   . Macular degeneration   . PAC (premature atrial contraction)   . PAF (paroxysmal atrial fibrillation) ([place])    SEES DR. BRACKBILL  . Palpitations    Past Surgical History:  Procedure Laterality Date  . APPENDECTOMY    . Biopsy Facial  05/10/2019  . BLADDER REPAIR  1964   intestine leaks/bladder repair  . CARDIOVASCULAR STRESS TEST  11/29/2003   EF 64%  . CARDIOVASCULAR STRESS TEST  11/17/2012   No evidence of ischemia; EF 66%; inferior wall attenuation favored to rep diaphragmatic  attenuation  . CATARACT EXTRACTION W/ INTRAOCULAR LENS  IMPLANT, BILATERAL     2013  . ILEOSTOMY  04/16/2012   Procedure: ILEOSTOMY;  Surgeon: Adin Hector, MD;  Location: [place];  Service: General;  Laterality: N/A;  . ILEOSTOMY CLOSURE  12/07/2012  . ILEOSTOMY CLOSURE  12/07/2012   Procedure: ILEOSTOMY TAKEDOWN;  Surgeon: Adin Hector, MD;  Location: [place];  Service: General;  Laterality: N/A;  . LAPAROTOMY  04/16/2012   Procedure: EXPLORATORY LAPAROTOMY;  Surgeon: Adin Hector, MD;  Location: [place];  Service: General;  Laterality: N/A;  . LYSIS OF ADHESION  12/07/2012   Procedure: LYSIS OF ADHESION;  Surgeon: Adin Hector, MD;  Location: [place];  Service: General;  Laterality: N/A;  . PROCTOSCOPY  12/07/2012   Procedure: PROCTOSCOPY;  Surgeon: Adin Hector, MD;  Location: [place];  Service: General;;  ILEO PROCTOSCOPY  . STOMACH SURGERY  1959   ulcer  . TONSILLECTOMY    . US ECHOCARDIOGRAPHY  05/20/2010   EF 55-60%  . US ECHOCARDIOGRAPHY  10/23/2005   EF 55-60%   Family History  Problem Relation Age of Onset  . Heart disease Maternal Grandfather   . Heart disease Other        maternal side  . Congestive Heart Failure Daughter   . Colon cancer Neg Hx    Social History   Tobacco Use  . Smoking status: Former  Smoker    Packs/day: 1.00    Years: 2.00    Pack years: 2.00    Quit date: 11/10/1964    Years since quitting: 55.4  . Smokeless tobacco: Never Used  Substance Use Topics  . Alcohol use: No    Marital Status: Married  ROS:   Review of Systems  Cardiovascular: Negative for chest pain, dyspnea on exertion and leg swelling.  Gastrointestinal: Negative for melena.   Objective:  Blood pressure (!) 152/69, pulse (!) 55, resp. rate 15, height _0  (1.753 m), weight 156 lb (70.8 kg), SpO2 100 %. Body mass index is 23.04 kg/m.  Vitals with BMI 04/12/2020 02/17/2020 02/13/2020  Height _1  _2  -  Weight 156 lbs 161 lbs 5 oz -  BMI 46.80 32.12 -  Systolic 248  250 037  Diastolic 69 77 81  Pulse 55 71 57    Physical Exam  Constitutional: Vital signs are normal. He appears well-developed and well-nourished.  HENT:  Head: Normocephalic.  Cardiovascular: Normal rate, regular rhythm and intact distal pulses. Exam reveals a midsystolic click.  Murmur heard.  Mid to late systolic murmur is present with a grade of 2/6 at the apex. Pulmonary/Chest: Effort normal and breath sounds normal. No accessory muscle usage. No respiratory distress.  Abdominal: Soft. Bowel sounds are normal.  Vitals reviewed.  Laboratory examination:   Recent Labs    02/13/20 1243  NA 141  K 4.5  CL 108  CO2 19*  GLUCOSE 100*  BUN 19  CREATININE 1.32*  CALCIUM 10.1  GFRNONAA 51*  GFRAA 59*    CrCl cannot be calculated (Patient's most recent lab result is older than the maximum 21 days allowed.).  CMP Latest Ref Rng & Units 02/13/2020 07/14/2016 02/24/2015  Glucose 70 - 99 mg/dL 100(H) 109(H) 92  BUN 8 - 23 mg/dL _3 Creatinine 0.61 - 1.24 mg/dL 1.32(H) 1.32(H) 1.38(H)  Sodium 135 - 145 mmol/L 141 135 137  Potassium 3.5 - 5.1 mmol/L 4.5 4.0 3.8  Chloride 98 - 111 mmol/L 108 107 107  CO2 22 - 32 mmol/L 19(L) 24 19  Calcium 8.9 - 10.3 mg/dL 10.1 8.7(L) 8.0(L)  Total Protein 6.0 - 8.3 g/dL - - 5.2(L)  Total Bilirubin 0.3 - 1.2 mg/dL - - 0.7  Alkaline Phos 39 - 117 U/L - - 41  AST 0 - 37 U/L - - 23  ALT 0 - 53 U/L - - 14   CBC Latest Ref Rng & Units 02/13/2020 07/14/2016 02/24/2015  WBC 4.0 - 10.5 K/uL 7.3 4.9 8.1  Hemoglobin 13.0 - 17.0 g/dL 14.9 13.9 12.3(L)  Hematocrit 39.0 - 52.0 % 45.0 42.5 36.8(L)  Platelets 150 - 400 K/uL 282 205 158   BNP (last 3 results) Recent Labs    02/13/20 1210  BNP 65.2   No results found for: HGBA1C  Lab Results  Component Value Date   TSH 4.641 (H) 12/10/2012    External labs: 01/05/2020:  HbA1C 5.5% Albumin 4.2, Total Bilirubin 0.4, Bilirubin, Direct 0.1, Alkaline Phosphatase 57, AST 22, ALT 13, Total Protein  6.3.   12/28/2019:  BUN 20, Creatinine 1.21, Na/K 137/4.7, Glucose 167, eGFR 56,  Hemoglobin 13.4, MCV 97.9, RBC 4.04, WBC 7.7.   Cholesterol, total 139.000 m 09/26/2019 Triglycerides 111.000 09/26/2019 HDL 50 MG/DL 09/26/2019 LDL 67.000 mg 09/26/2019  A1C 5.300 % 09/26/2019; TSH 5.080 09/26/2019  Radiology:   Chest X-Ray 02/13/2020: No active cardiopulmonary disease.  MRI abdomen with and without  contrast 03/07/2020: 1. Biliary and pancreatic ductal dilatation with abrupt cutoff of the common bile duct proximal to the ampulla as described above. Subtle periampullary enhancement. Recommend correlation with ERCP, as underlying lesion could have this appearance. 2. 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 3. Mild hepatic steatosis.  CT abdomen 01/02/2020: 1. Status post subtotal colectomy with ileorectal anastomosis. No CT evidence of active disease. 2. Mild biliary ductal fullness with smooth tapering toward the level of the ampulla, nonspecific. Correlate with biliary labs and ERCP if elevated.  3. Sequelae of chronic pancreatitis with the pancreatic duct ectasia and punctate parenchymal calcifications. Subcentimeter cystic focus within the pancreatic head may reflect a side branch intraductal papillary mucinous neoplasm versus sequelae of pseudocyst. Consider 6-12 month follow-up with pancreas protocol MR.  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 Also had narrow complex tachy for 15 minutes that again is suspicious for A. Fl with 2:1 conduction vs atrial tachycardia vs PSVT.  Event Monitor 09/16/2017: Sinus rhythm, heart rate 37-126 bpm. symptoms of fatigue with sinus rhythm at 80 bpm. No atrial fibrillation, atrial flutter  Echocardiogram 02/28/2020:  Normal LV systolic function with visual EF 50-55%. Left ventricle cavity is normal in size. Moderate left ventricular hypertrophy.  Normal global wall motion. Normal diastolic filling pattern, normal LAP.  Structurally normal mitral valve. No evidence of mitral stenosis. Mild (Grade I) mitral regurgitation.  Structurally normal tricuspid valve. No evidence of tricuspid stenosis.  Mild tricuspid regurgitation.  Compared to previous study dated 09/28/2017 Mild MR and TR are new. LVEF remains stable.  Exercise tetrofosmin stress test  02/29/2020: Normal ECG stress. The baseline blood pressure was 172/90 mmHg and increased to 208/80 mmHg, which is a hypertensive response to exercise. The patient exercised for 4 minutes and 45 seconds of a Bruce protocol, achieving approximately 7 METs.  Myocardial perfusion is normal. Overall LV systolic function is normal without regional wall motion abnormalities. Stress LV EF: 71%.  Compared to 09/21/2017, no significant change.   EKG:  02/17/2020: Normal sinus rhythm at rate of 65 bpm, left axis deviation, left anterior fascicular block.  Incomplete right bundle branch block.  Nonspecific T abnormality. Compared to EKG 02/13/2020 and 12/01/2019, previously normal EKG.   11/30/2019: Sinus bradycardia at 56 bpm, normal axis, no evidence of ischemia.  Assessment:     ICD-10-CM   1. Dyspnea on exertion  R06.00   2. Stage 3a chronic kidney disease  N18.31   3. Malaise and fatigue  R53.81    R53.83   4. Primary hypertension  Z56 Basic metabolic panel    losartan (COZAAR) 25 MG tablet     Outpatient Encounter Medications as of 04/12/2020  Medication Sig  . aspirin EC 81 MG tablet Take 81 mg by mouth. M, W, F only  . azaTHIOprine (IMURAN) 50 MG tablet Take 50 mg by mouth daily.  . ergocalciferol (VITAMIN D2) 50000 UNITS capsule Take 50,000 Units by mouth once a week.   . fenofibrate (LOFIBRA) 160 MG tablet Take 160 mg by mouth daily.  . IRON PO Take 2 tablets by mouth daily. Iron 81m  . levothyroxine (SYNTHROID) 25 MCG tablet Take 25 mcg by mouth daily before breakfast.  . loperamide  (IMODIUM) 2 MG capsule Take 2 mg by mouth daily as needed for diarrhea or loose stools.  . metoprolol (LOPRESSOR) 50 MG tablet Take 0.5 tablets (25 mg total) by mouth daily.  . Multiple Vitamin (MULTI VITAMIN  DAILY PO) Take 1 tablet by mouth daily.  . Multiple Vitamins-Minerals (PRESERVISION AREDS 2 PO) Take 2 tablets by mouth daily.  [name] testosterone cypionate (DEPOTESTOSTERONE CYPIONATE) 200 MG/ML injection Inject 1 mL into the muscle every 21 ( twenty-one) days.  . Wheat Dextrin (BENEFIBER DRINK MIX PO) Take 1 Dose by mouth 3 (three) times daily as needed (when not eating a high fiber diet).   [name] losartan (COZAAR) 25 MG tablet Take 2 tablets (50 mg total) by mouth every evening.   No facility-administered encounter medications on file as of 04/12/2020.    Recommendations:   Nicholas [name]  is a 79 y.o. 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 Flutter with abberancy. He has diagnosis of paroxysmal atrial fibrillation by history in January 2005, He is only on ASA therapy per patient choice.  He is now being evaluated for atypical chest pain and marked dyspnea that started in April 2021.  I reviewed the results of the stress test and echocardiogram.  Suspect his dyspnea may be related to hypertension with hypertensive heart disease as his blood pressure was elevated both during prior office visits and today.  Patient has also recorded high blood pressure at home.  Previously had suspected we should do CT scan of the chest to exclude PE in view of patient being on testosterone however patient has measured  his pulse oximeter at home during episodes of dyspnea and his sats have always been >97 to 98%.  Hence suspicion is low.  I reviewed the CT scan of the abdomen and also MRI of the abdomen, previously he had unexplained weight loss which I suspect was probably related to episodes of acute on chronic pancreatitis which is remained stable now.  Visualized lung fields were clear.  Please see data in the above section.  A competent of age-related deconditioning is also an etiology for his dyspnea.  I'll start him on low-dose losartan in view of stage III chronic kidney disease for hypertension and also renal protection.  Doubt that 25 mg of losartan will control his blood pressure, would probably also add amlodipine 5 mg daily, patient will call me in about 3 weeks also to let me know his numbers while at home, goal blood pressure 130/80 mmHg.  I would like to obtain a BMP in 2 weeks.    I would like to see him back in 6-8  weeks and if he remains stable on a as needed basis.  If symptoms do not improve or they keep recurring, consider pulmonary evaluation.   Adrian Prows, MD, [facility] 04/12/2020, 11:00 AM [facility] Pager: [phone] Office: [phone]

## 2020-04-12 ENCOUNTER — Other Ambulatory Visit: Payer: Self-pay

## 2020-04-12 ENCOUNTER — Encounter: Payer: Self-pay | Admitting: Cardiology

## 2020-04-12 ENCOUNTER — Ambulatory Visit: Payer: PPO | Admitting: Cardiology

## 2020-04-12 VITALS — BP 152/69 | HR 55 | Resp 15 | Ht 69.0 in | Wt 156.0 lb

## 2020-04-12 DIAGNOSIS — R0609 Other forms of dyspnea: Secondary | ICD-10-CM

## 2020-04-12 DIAGNOSIS — R5383 Other fatigue: Secondary | ICD-10-CM

## 2020-04-12 DIAGNOSIS — N1831 Chronic kidney disease, stage 3a: Secondary | ICD-10-CM | POA: Diagnosis not present

## 2020-04-12 DIAGNOSIS — I10 Essential (primary) hypertension: Secondary | ICD-10-CM | POA: Diagnosis not present

## 2020-04-12 DIAGNOSIS — R5381 Other malaise: Secondary | ICD-10-CM | POA: Diagnosis not present

## 2020-04-12 MED ORDER — LOSARTAN POTASSIUM 25 MG PO TABS: 50.0000 mg | ORAL_TABLET | Freq: Every evening | ORAL | 2 refills | Status: DC

## 2020-04-16 DIAGNOSIS — I480 Paroxysmal atrial fibrillation: Secondary | ICD-10-CM | POA: Diagnosis not present

## 2020-04-16 DIAGNOSIS — I10 Essential (primary) hypertension: Secondary | ICD-10-CM | POA: Insufficient documentation

## 2020-04-16 DIAGNOSIS — K912 Postsurgical malabsorption, not elsewhere classified: Secondary | ICD-10-CM | POA: Diagnosis not present

## 2020-04-16 DIAGNOSIS — R634 Abnormal weight loss: Secondary | ICD-10-CM | POA: Diagnosis not present

## 2020-04-26 DIAGNOSIS — I10 Essential (primary) hypertension: Secondary | ICD-10-CM | POA: Diagnosis not present

## 2020-04-27 LAB — BASIC METABOLIC PANEL
BUN/Creatinine Ratio: 10 (ref 10–24)
BUN: 13 mg/dL (ref 8–27)
CO2: 24 mmol/L (ref 20–29)
Calcium: 9.5 mg/dL (ref 8.6–10.2)
Chloride: 104 mmol/L (ref 96–106)
Creatinine, Ser: 1.27 mg/dL (ref 0.76–1.27)
GFR calc Af Amer: 61 mL/min/{1.73_m2} (ref >59)
GFR calc non Af Amer: 53 mL/min/{1.73_m2} — ABNORMAL LOW (ref >59)
Glucose: 106 mg/dL — ABNORMAL HIGH (ref 65–99)
Potassium: 4.5 mmol/L (ref 3.5–5.2)
Sodium: 139 mmol/L (ref 134–144)

## 2020-04-28 NOTE — Progress Notes (Signed)
Stable renal function. Normal electrolytes

## 2020-06-15 ENCOUNTER — Ambulatory Visit: Payer: PPO | Admitting: Cardiology

## 2020-06-20 DIAGNOSIS — Z85828 Personal history of other malignant neoplasm of skin: Secondary | ICD-10-CM | POA: Diagnosis not present

## 2020-06-20 DIAGNOSIS — D1801 Hemangioma of skin and subcutaneous tissue: Secondary | ICD-10-CM | POA: Diagnosis not present

## 2020-06-20 DIAGNOSIS — L905 Scar conditions and fibrosis of skin: Secondary | ICD-10-CM | POA: Diagnosis not present

## 2020-06-20 DIAGNOSIS — L570 Actinic keratosis: Secondary | ICD-10-CM | POA: Diagnosis not present

## 2020-06-20 DIAGNOSIS — D485 Neoplasm of uncertain behavior of skin: Secondary | ICD-10-CM | POA: Diagnosis not present

## 2020-06-20 DIAGNOSIS — L819 Disorder of pigmentation, unspecified: Secondary | ICD-10-CM | POA: Diagnosis not present

## 2020-06-20 DIAGNOSIS — C44329 Squamous cell carcinoma of skin of other parts of face: Secondary | ICD-10-CM | POA: Diagnosis not present

## 2020-06-20 DIAGNOSIS — R238 Other skin changes: Secondary | ICD-10-CM | POA: Diagnosis not present

## 2020-06-29 ENCOUNTER — Ambulatory Visit: Payer: PPO | Admitting: Cardiology

## 2020-07-05 DIAGNOSIS — C44329 Squamous cell carcinoma of skin of other parts of face: Secondary | ICD-10-CM | POA: Diagnosis not present

## 2020-07-08 ENCOUNTER — Other Ambulatory Visit: Payer: Self-pay | Admitting: Cardiology

## 2020-07-08 DIAGNOSIS — I10 Essential (primary) hypertension: Secondary | ICD-10-CM

## 2020-07-17 DIAGNOSIS — E039 Hypothyroidism, unspecified: Secondary | ICD-10-CM | POA: Diagnosis not present

## 2020-07-17 DIAGNOSIS — I480 Paroxysmal atrial fibrillation: Secondary | ICD-10-CM | POA: Diagnosis not present

## 2020-07-17 DIAGNOSIS — E785 Hyperlipidemia, unspecified: Secondary | ICD-10-CM | POA: Diagnosis not present

## 2020-07-17 DIAGNOSIS — G4700 Insomnia, unspecified: Secondary | ICD-10-CM | POA: Diagnosis not present

## 2020-07-17 DIAGNOSIS — E46 Unspecified protein-calorie malnutrition: Secondary | ICD-10-CM | POA: Diagnosis not present

## 2020-07-17 DIAGNOSIS — F0390 Unspecified dementia without behavioral disturbance: Secondary | ICD-10-CM | POA: Diagnosis not present

## 2020-07-17 DIAGNOSIS — K5090 Crohn's disease, unspecified, without complications: Secondary | ICD-10-CM | POA: Diagnosis not present

## 2020-07-17 DIAGNOSIS — K912 Postsurgical malabsorption, not elsewhere classified: Secondary | ICD-10-CM | POA: Diagnosis not present

## 2020-07-17 DIAGNOSIS — I10 Essential (primary) hypertension: Secondary | ICD-10-CM | POA: Diagnosis not present

## 2020-07-20 ENCOUNTER — Encounter: Payer: Self-pay | Admitting: Cardiology

## 2020-07-20 ENCOUNTER — Ambulatory Visit: Payer: PPO | Admitting: Cardiology

## 2020-07-20 ENCOUNTER — Other Ambulatory Visit: Payer: Self-pay

## 2020-07-20 VITALS — BP 128/65 | HR 52 | Resp 15 | Ht 69.0 in | Wt 158.0 lb

## 2020-07-20 DIAGNOSIS — R0609 Other forms of dyspnea: Secondary | ICD-10-CM

## 2020-07-20 DIAGNOSIS — I480 Paroxysmal atrial fibrillation: Secondary | ICD-10-CM | POA: Diagnosis not present

## 2020-07-20 DIAGNOSIS — I10 Essential (primary) hypertension: Secondary | ICD-10-CM

## 2020-07-20 NOTE — Progress Notes (Signed)
Primary Physician:  Leanna Battles, MD   Patient ID: Nicholas [name], male    DOB: [birth year], 79 y.o.   MRN: [id number]  Chief Complaint  Patient presents with  . Follow-up    2 month  . Shortness of Breath  . Hypertension    HPI   Nicholas [name]  is a 79 y.o. male  with chronic dyspnea on exertion, dyslipidemia, diet-controlled diabetes mellitus, Crohn's disease on Aziothioprine,  admitted to [facility] in January 2014 when he had reversal of ileostomy postoperatively on 11/12/2012 had wide complex tachycardia and also narrow complex tachycardia suggestive of PSVT and also A. Flutter with abberancy. He has diagnosis of paroxysmal atrial fibrillation by history in January 2005, he is only on ASA therapy per patient choice.   At last visit started patient on 25 mg of losartan in the evenings.  Now presents for 57-monthfollow-up on hypertension and dyspnea.  Patient reports dyspnea has resolved, since last visit he has had no episodes of shortness of breath or dyspnea upon exertion.  Patient monitors blood pressure occasionally at home, reports systolic readings in the 1920F  Denies chest pain, palpitations, dizziness, syncope, leg swelling.  Past Medical History:  Diagnosis Date  . Anemia    PO IRON   . Aortic stenosis, mild   . Cataract   . Colonic hemorrhage 1964  . Crohn's disease ([place]   . GERD (gastroesophageal reflux disease)   . Heart murmur   . Hyperlipidemia   . Macular degeneration   . PAC (premature atrial contraction)   . PA[place] ([place]oxysmal [place]    SEES DR. BRACKBILL  . Palpitations    Past Surgical History:  Procedure Laterality Date  . APPENDECTOMY    . Biopsy Facial  05/10/2019  . BLADDER REPAIR  1964   intestine leaks/bladder repair  . CARDIOVASCULAR STRESS TEST  11/29/2003   EF 64%  . CARDIOVASCULAR STRESS TEST  11/17/2012   No evidence of ischemia; EF 66%; inferior wall attenuation favored to rep diaphragmatic attenuation  .  CATARACT EXTRACTION W/ INTRAOCULAR LENS  IMPLANT, BILATERAL     2013  . ILEOSTOMY  04/16/2012   Procedure: ILEOSTOMY;  Surgeon: HAdin Hector MD;  [place]  Service: General;  Laterality: N/A;  . ILEOSTOMY CLOSURE  12/07/2012  . ILEOSTOMY CLOSURE  12/07/2012   Procedure: ILEOSTOMY TAKEDOWN;  Surgeon: HAdin Hector MD;  [place]  Service: General;  Laterality: N/A;  . LAPAROTOMY  04/16/2012   Procedure: EXPLORATORY LAPAROTOMY;  Surgeon: HAdin Hector MD;  [place] [facility]  Service: General;  Laterality: N/A;  . LYSIS OF ADHESION  12/07/2012   Procedure: LYSIS OF ADHESION;  Surgeon: HAdin Hector MD;  [place] [facility]  Service: General;  Laterality: N/A;  . PROCTOSCOPY  12/07/2012   Procedure: PROCTOSCOPY;  Surgeon: HAdin Hector MD;  [place]  Service: General;;  ILEO PROCTOSCOPY  . STOMACH SURGERY  1959   ulcer  . TONSILLECTOMY    . [place]ARDIOGRAPHY  05/20/2010   EF 55-60%  . [place]ARDIOGRAPHY  10/23/2005   EF 55-60%   Family History  Problem Relation Age of Onset  . Heart disease Maternal Grandfather   . Heart disease Other        maternal side  . Congestive Heart Failure Daughter   . Colon cancer Neg Hx    Social History   Tobacco Use  . Smoking status: Former Smoker  Packs/day: 1.00    Years: 2.00    Pack years: 2.00    Quit date: 11/10/1964    Years since quitting: 55.7  . Smokeless tobacco: Never Used  Substance Use Topics  . Alcohol use: No    Marital Status: Married  ROS:   Review of Systems  Cardiovascular: Negative for chest pain, claudication, dyspnea on exertion, leg swelling, orthopnea, palpitations, paroxysmal nocturnal dyspnea and syncope.  Respiratory: Negative for shortness of breath.   Hematologic/Lymphatic: Does not bruise/bleed easily.  Gastrointestinal: Negative for melena.   Objective:  Blood pressure 128/65, pulse (!) 52, resp. rate 15, height _0  (1.753 m), weight 158 lb (71.7 kg), SpO2 100 %. Body  mass index is 23.33 kg/m.  Vitals with BMI 07/20/2020 04/12/2020 02/17/2020  Height _1  _2  _3   Weight 158 lbs 156 lbs 161 lbs 5 oz  BMI 23.32 81.19 14.78  Systolic 295 621 308  Diastolic 65 69 77  Pulse 52 55 71    Physical Exam Vitals reviewed.  Constitutional:      Appearance: He is well-developed.  Cardiovascular:     Rate and Rhythm: Regular rhythm. Bradycardia present.     Pulses: Intact distal pulses.          Carotid pulses are 2+ on the right side and 2+ on the left side.      Radial pulses are 2+ on the right side and 2+ on the left side.       Dorsalis pedis pulses are 1+ on the right side and 1+ on the left side.     Heart sounds: Murmur heard.  Mid to late systolic murmur is present with a grade of 2/6 at the apex.      Comments: No leg edema.  Pulmonary:     Effort: Pulmonary effort is normal. No accessory muscle usage or respiratory distress.     Breath sounds: Normal breath sounds. No wheezing, rhonchi or rales.    Laboratory examination:   Recent Labs    02/13/20 1243 04/26/20 1506  NA 141 139  K 4.5 4.5  CL 108 104  CO2 19* 24  GLUCOSE 100* 106*  BUN 19 13  CREATININE 1.32* 1.27  CALCIUM 10.1 9.5  GFRNONAA 51* 53*  GFRAA 59* 61    CrCl cannot be calculated (Patient's most recent lab result is older than the maximum 21 days allowed.).  CMP Latest Ref Rng & Units 04/26/2020 02/13/2020 07/14/2016  Glucose 65 - 99 mg/dL 106(H) 100(H) 109(H)  BUN 8 - 27 mg/dL _4 Creatinine 0.76 - 1.27 mg/dL 1.27 1.32(H) 1.32(H)  Sodium 134 - 144 mmol/L 139 141 135  Potassium 3.5 - 5.2 mmol/L 4.5 4.5 4.0  Chloride 96 - 106 mmol/L 104 108 107  CO2 20 - 29 mmol/L 24 19(L) 24  Calcium 8.6 - 10.2 mg/dL 9.5 10.1 8.7(L)  Total Protein 6.0 - 8.3 g/dL - - -  Total Bilirubin 0.3 - 1.2 mg/dL - - -  Alkaline Phos 39 - 117 U/L - - -  AST 0 - 37 U/L - - -  ALT 0 - 53 U/L - - -   CBC Latest Ref Rng & Units 02/13/2020 07/14/2016 02/24/2015  WBC 4.0 - 10.5 K/uL 7.3 4.9 8.1   Hemoglobin 13.0 - 17.0 g/dL 14.9 13.9 12.3(L)  Hematocrit 39 - 52 % 45.0 42.5 36.8(L)  Platelets 150 - 400 K/uL 282 205 158   BNP (last 3 results) Recent Labs  02/13/20 1210  BNP 65.2   No results found for: HGBA1C  Lab Results  Component Value Date   TSH 4.641 (H) 12/10/2012    External labs: 01/05/2020:  HbA1C 5.5% Albumin 4.2, Total Bilirubin 0.4, Bilirubin, Direct 0.1, Alkaline Phosphatase 57, AST 22, ALT 13, Total Protein 6.3.   12/28/2019:  BUN 20, Creatinine 1.21, Na/K 137/4.7, Glucose 167, eGFR 56,  Hemoglobin 13.4, MCV 97.9, RBC 4.04, WBC 7.7.   Cholesterol, total 139.000 m 09/26/2019 Triglycerides 111.000 09/26/2019 HDL 50 MG/DL 09/26/2019 LDL 67.000 mg 09/26/2019  A1C 5.300 % 09/26/2019; TSH 5.080 09/26/2019  Radiology:   Chest X-Ray 02/13/2020: No active cardiopulmonary disease.  MRI abdomen with and without contrast 03/07/2020: 1. Biliary and pancreatic ductal dilatation with abrupt cutoff of the common bile duct proximal to the ampulla as described above. Subtle periampullary enhancement. Recommend correlation with ERCP, as underlying lesion could have this appearance. 2. 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 3. Mild hepatic steatosis.  CT abdomen 01/02/2020: 1. Status post subtotal colectomy with ileorectal anastomosis. No CT evidence of active disease. 2. Mild biliary ductal fullness with smooth tapering toward the level of the ampulla, nonspecific. Correlate with biliary labs and ERCP if elevated.  3. Sequelae of chronic pancreatitis with the pancreatic duct ectasia and punctate parenchymal calcifications. Subcentimeter cystic focus within the pancreatic head may reflect a side branch intraductal papillary mucinous neoplasm versus sequelae of pseudocyst. Consider 6-12 month follow-up with pancreas protocol MR.  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 Also had narrow complex tachy for 15 minutes that again is suspicious for A. Fl with 2:1 conduction vs atrial tachycardia vs PSVT.  Event Monitor 09/16/2017: Sinus rhythm, heart rate 37-126 bpm. symptoms of fatigue with sinus rhythm at 80 bpm. No atrial fibrillation, atrial flutter  Echocardiogram 02/28/2020:  Normal LV systolic function with visual EF 50-55%. Left ventricle cavity is normal in size. Moderate left ventricular hypertrophy. Normal global wall motion. Normal diastolic filling pattern, normal LAP.  Structurally normal mitral valve. No evidence of mitral stenosis. Mild (Grade I) mitral regurgitation.  Structurally normal tricuspid valve. No evidence of tricuspid stenosis.  Mild tricuspid regurgitation.  Compared to previous study dated 09/28/2017 Mild MR and TR are new. LVEF remains stable.  Exercise tetrofosmin stress test  02/29/2020: Normal ECG stress. The baseline blood pressure was 172/90 mmHg and increased to 208/80 mmHg, which is a hypertensive response to exercise. The patient exercised for 4 minutes and 45 seconds of a Bruce protocol, achieving approximately 7 METs.  Myocardial perfusion is normal. Overall LV systolic function is normal without regional wall motion abnormalities. Stress LV EF: 71%.  Compared to 09/21/2017, no significant change.   EKG:  02/17/2020: Normal sinus rhythm at rate of 65 bpm, left axis deviation, left anterior fascicular block.  Incomplete right bundle branch block.  Nonspecific T abnormality. Compared to EKG 02/13/2020 and 12/01/2019, previously normal EKG.   11/30/2019: Sinus bradycardia at 56 bpm, normal axis, no evidence of ischemia.  Assessment:     ICD-10-CM   1. Dyspnea on exertion  R06.00   2. Primary hypertension  I10   3. Paroxysmal atrial fibrillation (HCC)  I48.0      Outpatient Encounter Medications as of 07/20/2020  Medication Sig Note  . aspirin EC 81 MG tablet Take 81 mg by mouth. M, W, F only   . azaTHIOprine  (IMURAN) 50 MG tablet Take 50 mg by mouth daily.   [name]  ergocalciferol (VITAMIN D2) 50000 UNITS capsule Take 50,000 Units by mouth once a week.    . fenofibrate (LOFIBRA) 160 MG tablet Take 160 mg by mouth daily.   . IRON PO Take 2 tablets by mouth daily. Iron 81m   . levothyroxine (SYNTHROID) 25 MCG tablet Take 25 mcg by mouth daily before breakfast.   . loperamide (IMODIUM) 2 MG capsule Take 2 mg by mouth daily as needed for diarrhea or loose stools.   .Marland Kitchenlosartan (COZAAR) 25 MG tablet TAKE 2 TABLETS (50 MG TOTAL) BY MOUTH EVERY EVENING.   . metoprolol succinate (TOPROL-XL) 25 MG 24 hr tablet Take 25 mg by mouth daily.   . Multiple Vitamin (MULTI VITAMIN DAILY PO) Take 1 tablet by mouth daily.   . Multiple Vitamins-Minerals (PRESERVISION AREDS 2 PO) Take 2 tablets by mouth daily.   .Marland Kitchentestosterone cypionate (DEPOTESTOSTERONE CYPIONATE) 200 MG/ML injection Inject 1 mL into the muscle every 21 ( twenty-one) days.   . Wheat Dextrin (BENEFIBER DRINK MIX PO) Take 1 Dose by mouth 3 (three) times daily as needed (when not eating a high fiber diet).    . [DISCONTINUED] metoprolol (LOPRESSOR) 50 MG tablet Take 0.5 tablets (25 mg total) by mouth daily. (Patient not taking: Reported on 07/20/2020) 07/20/2020: started metoprolol succinate    No facility-administered encounter medications on file as of 07/20/2020.    Recommendations:   KTULLIO CHAUSSE is a 79y.o. 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 Flutter with abberancy. He has diagnosis of paroxysmal atrial fibrillation by history in January 2005, he is only  on ASA therapy per patient choice.   Patient presents for 234-monthollow-up of hypertension and dyspnea.  Symptoms of dyspnea have resolved.  Suspect previous episodes of dyspnea related to age-related deconditioning and potentially hypertensive heart disease.  Blood pressure is now well controlled today, will continue losartan as renal function has been stable. Will also continue metoprolol succinate.  Of note there is no recent lipid profile testing available to review, patient states he follows up with his PCP in December.  Will defer further management of hypercholesterolemia to primary care provider.  Will follow-up in 1 year for history of atrial fibrillation, hypertension.   Patient was seen in collaboration with Dr. GaEinar GipHe also reviewed patient's chart and examined the patient. Dr. GaEinar Gips in agreement of the plan.    CeAlethia BertholdPA-C 07/20/2020, 2:42 PM Office: [phone]

## 2020-07-23 ENCOUNTER — Other Ambulatory Visit: Payer: Self-pay

## 2020-07-23 ENCOUNTER — Ambulatory Visit: Payer: PPO | Admitting: Neurology

## 2020-07-23 ENCOUNTER — Encounter: Payer: Self-pay | Admitting: Neurology

## 2020-07-23 VITALS — BP 146/80 | HR 60 | Ht 69.0 in | Wt 157.3 lb

## 2020-07-23 DIAGNOSIS — R0683 Snoring: Secondary | ICD-10-CM

## 2020-07-23 DIAGNOSIS — R419 Unspecified symptoms and signs involving cognitive functions and awareness: Secondary | ICD-10-CM

## 2020-07-23 DIAGNOSIS — G4719 Other hypersomnia: Secondary | ICD-10-CM

## 2020-07-23 DIAGNOSIS — I480 Paroxysmal atrial fibrillation: Secondary | ICD-10-CM

## 2020-07-23 NOTE — Patient Instructions (Signed)
As discussed, I will order a sleep study.  We will check with your insurance for authorization for a laboratory attended sleep study versus a home sleep test.  We will call you to schedule your sleep study soon.  If you have sleep apnea, the preferred treatment is in the form of a CPAP machine or AutoPap machine.

## 2020-07-23 NOTE — Progress Notes (Signed)
Subjective:    Patient ID: Nicholas [name] is a 79 y.o. male.  HPI     Interim history:   Nicholas [name] is an 79 year old right-handed gentleman with an underlying medical history of macular degeneration, PACs, paroxysmal A. fib, heart murmur, Crohn's disease, cataract, aortic stenosis, vitamin D deficiency, weight loss, anemia, and hyperlipidemia, who Presents for reevaluation of his sleep disturbance, concern for underlying obstructive sleep apnea.  The patient is unaccompanied today.  I first met him at the request of his primary care physician, Dr. Philip Aspen, on 10/20/2019, at which time the patient reported snoring and daytime tiredness.  His tiredness improved a little bit in the recent months.  He reported some weight loss.  He was trying to supplement his nutrition with Ensure.  He was struggling with diarrhea.  He was advised to proceed with sleep study testing but wanted to delay the test till after the first of the year.  He did not pursue sleep study testing earlier in the year.  Today, 07/23/2020: He reports that his daytime tiredness is about the same.  Epworth sleepiness score is 9 out of 24, fatigue severity score is 51 out of 63.  He had abdominal pain and had GI work-up for this.  His pain improved.  His weight has been stable.  In the past few months he has had some forgetfulness, also difficulty with orientation while driving.  He has misplaced his wallet and his keys recently at his daughter's house.  He reports that he has been encouraged to pursue sleep study testing.  He had a checkup with cardiology.  He would be willing to pursue a sleep study. He does not drive in the dark.  He lives with his wife, bedtime is generally between 9 and 11 PM and rise time between 6 and 8 AM.  He has nocturia up to once per night.  Denies any recurrent morning headaches.  His daughter was diagnosed with sleep apnea but is not using a CPAP any longer.   Previously:   10/20/19: (He) reports snoring  and daytime tiredness.  His tiredness has improved a little bit in the past couple of months.  He has had some unintentional weight loss and has started using Ensure, half a bottle twice daily.  He still struggles with diarrhea.  He goes to bed generally around 9-ish and rise time is around 7.  He does not have night to night nocturia and denies any recurrent morning headaches.  His middle daughter has sleep apnea but does not use her CPAP machine.  He has woken up with a snorting sound and his wife has noted occasional snorting sounds when he is asleep.  He does have some restless sleep at times, he does not take any sleep aid.  He had a tonsillectomy at age 72.  He has a prescription for Synthroid pending which he has not yet started. I reviewed your telemedicine note from 10/03/2019, which you kindly included.  His Epworth sleepiness score is 10 out of 24, fatigue severity score is 42 out of 63.  He is a retired Furniture conservator/restorer.  He lives with his wife, no pets in the house.  He lost his oldest daughter last year and, tragically, his only granddaughter from her took her own life the year before at age 51.  His Past Medical History Is Significant For: Past Medical History:  Diagnosis Date  . Anemia    PO IRON   . Aortic stenosis, mild   .  Cataract   . Colonic hemorrhage 1964  . Crohn's disease ([place])   . GERD (gastroesophageal reflux disease)   . Heart murmur   . Hyperlipidemia   . Macular degeneration   . PAC (premature atrial contraction)   . PAF (paroxysmal atrial fibrillation) ([place])    SEES DR. BRACKBILL  . Palpitations     His Past Surgical History Is Significant For: Past Surgical History:  Procedure Laterality Date  . APPENDECTOMY    . Biopsy Facial  05/10/2019  . BLADDER REPAIR  1964   intestine leaks/bladder repair  . CARDIOVASCULAR STRESS TEST  11/29/2003   EF 64%  . CARDIOVASCULAR STRESS TEST  11/17/2012   No evidence of ischemia; EF 66%; inferior wall attenuation favored to rep  diaphragmatic attenuation  . CATARACT EXTRACTION W/ INTRAOCULAR LENS  IMPLANT, BILATERAL     2013  . ILEOSTOMY  04/16/2012   Procedure: ILEOSTOMY;  Surgeon: Adin Hector, MD;  Location: [facility];  Service: General;  Laterality: N/A;  . ILEOSTOMY CLOSURE  12/07/2012  . ILEOSTOMY CLOSURE  12/07/2012   Procedure: ILEOSTOMY TAKEDOWN;  Surgeon: Adin Hector, MD;  Location: [facility];  Service: General;  Laterality: N/A;  . LAPAROTOMY  04/16/2012   Procedure: EXPLORATORY LAPAROTOMY;  Surgeon: Adin Hector, MD;  Location: [facility];  Service: General;  Laterality: N/A;  . LYSIS OF ADHESION  12/07/2012   Procedure: LYSIS OF ADHESION;  Surgeon: Adin Hector, MD;  Location: [facility];  Service: General;  Laterality: N/A;  . PROCTOSCOPY  12/07/2012   Procedure: PROCTOSCOPY;  Surgeon: Adin Hector, MD;  Location: [facility];  Service: General;;  ILEO PROCTOSCOPY  . STOMACH SURGERY  1959   ulcer  . TONSILLECTOMY    . US ECHOCARDIOGRAPHY  05/20/2010   EF 55-60%  . US ECHOCARDIOGRAPHY  10/23/2005   EF 55-60%    His Family History Is Significant For: Family History  Problem Relation Age of Onset  . Heart disease Maternal Grandfather   . Heart disease Other        maternal side  . Congestive Heart Failure Daughter   . Colon cancer Neg Hx     His Social History Is Significant For: Social History   Socioeconomic History  . Marital status: Married    Spouse name: Not on file  . Number of children: 3  . Years of education: Not on file  . Highest education level: Not on file  Occupational History  . Occupation: Retired    Fish farm manager: [employer]  Tobacco Use  . Smoking status: Former Smoker    Packs/day: 1.00    Years: 2.00    Pack years: 2.00    Quit date: 11/10/1964    Years since quitting: 55.7  . Smokeless tobacco: Never Used  Vaping Use  . Vaping Use: Never used  Substance and Sexual Activity  . Alcohol use: No  . Drug use: No  . Sexual activity: Not on file  Other Topics Concern  .  Not on file  Social History Narrative   caffeinated drinks less than one a day. Eldest daughter passed from heart deficiency.    Social Determinants of Health   Financial Resource Strain:   . Difficulty of Paying Living Expenses: Not on file  Food Insecurity:   . Worried About Charity fundraiser in the Last Year: Not on file  . Ran Out of Food in the Last Year: Not on file  Transportation Needs:   .  Lack of Transportation (Medical): Not on file  . Lack of Transportation (Non-Medical): Not on file  Physical Activity:   . Days of Exercise per Week: Not on file  . Minutes of Exercise per Session: Not on file  Stress:   . Feeling of Stress : Not on file  Social Connections:   . Frequency of Communication with Friends and Family: Not on file  . Frequency of Social Gatherings with Friends and Family: Not on file  . Attends Religious Services: Not on file  . Active Member of Clubs or Organizations: Not on file  . Attends Archivist Meetings: Not on file  . Marital Status: Not on file    His Allergies Are:  No Known Allergies:   His Current Medications Are:  Outpatient Encounter Medications as of 07/23/2020  Medication Sig  . amLODipine (NORVASC) 2.5 MG tablet Take 2.5 mg by mouth daily.  [name] aspirin EC 81 MG tablet Take 81 mg by mouth. M, W, F only  . azaTHIOprine (IMURAN) 50 MG tablet Take 50 mg by mouth daily.  . ergocalciferol (VITAMIN D2) 50000 UNITS capsule Take 50,000 Units by mouth once a week.   . fenofibrate (LOFIBRA) 160 MG tablet Take 160 mg by mouth daily.  . IRON PO Take 2 tablets by mouth daily. Iron 65m  . levothyroxine (SYNTHROID) 25 MCG tablet Take 25 mcg by mouth daily before breakfast.  . loperamide (IMODIUM) 2 MG capsule Take 2 mg by mouth daily as needed for diarrhea or loose stools.  .Marland Kitchenlosartan (COZAAR) 25 MG tablet TAKE 2 TABLETS (50 MG TOTAL) BY MOUTH EVERY EVENING.  . metoprolol succinate (TOPROL-XL) 25 MG 24 hr tablet Take 25 mg by mouth daily.   . Multiple Vitamin (MULTI VITAMIN DAILY PO) Take 1 tablet by mouth daily.  . Multiple Vitamins-Minerals (PRESERVISION AREDS 2 PO) Take 2 tablets by mouth daily.  .Marland Kitchentestosterone cypionate (DEPOTESTOSTERONE CYPIONATE) 200 MG/ML injection Inject 1 mL into the muscle every 21 ( twenty-one) days.  . Wheat Dextrin (BENEFIBER DRINK MIX PO) Take 1 Dose by mouth 3 (three) times daily as needed (when not eating a high fiber diet).    No facility-administered encounter medications on file as of 07/23/2020.  :  Review of Systems:  Out of a complete 14 point review of systems, all are reviewed and negative with the exception of these symptoms as listed below: Review of Systems  Neurological:       Here to f/u on the possibility of completing a sleep study. Pt was evaluated back in Jan of 2020 but did not complete sleep study. Pt reports daytime sleepiness and snoring is still present. He also reports decline in his memory. PCP wanted osa to be ruled out.  Epworth Sleepiness Scale 0= would never doze 1= slight chance of dozing 2= moderate chance of dozing 3= high chance of dozing  Sitting and reading:2 Watching TV:2 Sitting inactive in a public place (ex. Theater or meeting):2 As a passenger in a car for an hour without a break:0 Lying down to rest in the afternoon:1 Sitting and talking to someone:0 Sitting quietly after lunch (no alcohol):2 In a car, while stopped in traffic:0 Total:9     Objective:  Neurological Exam  Physical Exam Physical Examination:   Vitals:   07/23/20 1152  BP: (!) 146/80  Pulse: 60  SpO2: 98%    General Examination: The patient is a very pleasant 79y.o. male in no acute distress. He appears well-developed and  well-nourished and well groomed.   HEENT: Normocephalic, atraumatic, pupils are equal, round and reactive to light, extraocular tracking is good without limitation to gaze excursion or nystagmus noted. Hearing is grossly intact. Face is symmetric  with normal facial animation.  Healing scar vertically between eyebrows, recent skin cancer removal per patient.  Speech is clear with no dysarthria noted. There is no hypophonia. There is no lip, neck/head, jaw or voice tremor. Neck is supple with full range of passive and active motion. There are no carotid bruits on auscultation. Oropharynx exam reveals: mild mouth dryness, adequate dental hygiene and mild airway crowding, due to Small airway entry and redundant soft palate.  Tonsils are absent. Neck circumference is 16 inches, Mallampati is class II, tongue protrudes centrally in palate elevates symmetrically.  Chest: Clear to auscultation without wheezing, rhonchi or crackles noted.  Heart: S1+S2+0, regular and normal without murmurs, rubs or gallops noted.   Abdomen: Soft, non-tender and non-distended with normal bowel sounds appreciated on auscultation.  Extremities: There is no pitting edema in the distal lower extremities bilaterally.   Skin: Warm and dry without trophic changes noted.   Musculoskeletal: exam reveals no obvious joint deformities, tenderness or joint swelling or erythema.   Neurologically:  Mental status: The patient is awake, alert and oriented in all 4 spheres. His immediate and remote memory, attention, language skills and fund of knowledge are fairly appropriate. Thought process is linear. Mood is normal and affect is normal.  Cranial nerves II - XII are as described above under HEENT exam.  Motor exam: Normal bulk, strength and tone is noted. There is no tremor, Romberg is negative. Fine motor skills and coordination: grossly intact.  Cerebellar testing: No dysmetria or intention tremor. There is no truncal or gait ataxia.  Sensory exam: intact to light touch in the upper and lower extremities.  Gait, station and balance: He stands easily. No veering to one side is noted. No leaning to one side is noted. Posture is age-appropriate and stance is narrow based.  Gait shows normal stride length and normal pace. No problems turning are noted.   Assessment and Plan:  In summary, Nicholas [name] is a very pleasant 79 year old male with an underlying medical history of macular degeneration, PACs, paroxysmal A. fib, heart murmur, Crohn's disease, cataract, aortic stenosis, vitamin D deficiency, weight loss, anemia, and hyperlipidemia, who presents for reevaluation of his sleep disturbance.  He decided not to pursue sleep study testing last year or earlier this year but would be willing to proceed.  He reports forgetfulness in the past few months.  He has misplaced please in his wallet and also has had some insecurity while driving.  We talked about the diagnosis of OSA, its prognosis and treatment options.  He would be willing to consider CPAP or AutoPap therapy if the need arises.  He would have someone drive him and pick him up for the sleep study. I explained the difference between a laboratory sleep study and a home sleep test. I plan to see him back after testing.  I answered all his questions today and he was in agreement. I spent 20 minutes in total face-to-face time and in reviewing records during pre-charting, more than 50% of which was spent in counseling and coordination of care, reviewing test results, reviewing medications and treatment regimen and/or in discussing or reviewing the diagnosis of OSA, the prognosis and treatment options. Pertinent laboratory and imaging test results that were available during this visit with the  patient were reviewed by me and considered in my medical decision making (see chart for details).

## 2020-08-06 ENCOUNTER — Ambulatory Visit (INDEPENDENT_AMBULATORY_CARE_PROVIDER_SITE_OTHER): Payer: PPO | Admitting: Neurology

## 2020-08-06 DIAGNOSIS — R419 Unspecified symptoms and signs involving cognitive functions and awareness: Secondary | ICD-10-CM

## 2020-08-06 DIAGNOSIS — G4719 Other hypersomnia: Secondary | ICD-10-CM

## 2020-08-06 DIAGNOSIS — G4761 Periodic limb movement disorder: Secondary | ICD-10-CM

## 2020-08-06 DIAGNOSIS — G4720 Circadian rhythm sleep disorder, unspecified type: Secondary | ICD-10-CM

## 2020-08-06 DIAGNOSIS — I480 Paroxysmal atrial fibrillation: Secondary | ICD-10-CM

## 2020-08-06 DIAGNOSIS — G4731 Primary central sleep apnea: Secondary | ICD-10-CM | POA: Diagnosis not present

## 2020-08-06 DIAGNOSIS — G4733 Obstructive sleep apnea (adult) (pediatric): Secondary | ICD-10-CM

## 2020-08-06 DIAGNOSIS — R0683 Snoring: Secondary | ICD-10-CM

## 2020-08-08 NOTE — Procedures (Signed)
PATIENT'S NAME:  Nicholas [name], Devery DOB:      03/28/1941      MR#:    [id number]     DATE OF RECORDING: 08/06/2020 REFERRING M.D.:  Leanna Battles, MD Study Performed:   Baseline Polysomnogram HISTORY: 79 year old man with a history of macular degeneration, PACs, paroxysmal A. fib, heart murmur, Crohn's disease, cataract, aortic stenosis, vitamin D deficiency, weight loss, anemia, and hyperlipidemia, who reports snoring and daytime tiredness. The patient endorsed the Epworth Sleepiness Scale at 9/24 points. The patient's weight 157 pounds with a height of 69 (inches), resulting in a BMI of 23.2 kg/m2. The patient's neck circumference measured 16 inches.  CURRENT MEDICATIONS: Norvasc, Aspirin, Imuran, Vitamin D2, Lofibra, Synthroid, Imodium, Cozaar, Toprol-XL, Multivitamin, Depotestosterone.   PROCEDURE:  This is a multichannel digital polysomnogram utilizing the Somnostar 11.2 system.  Electrodes and sensors were applied and monitored per AASM Specifications.   EEG, EOG, Chin and Limb EMG, were sampled at 200 Hz.  ECG, Snore and Nasal Pressure, Thermal Airflow, Respiratory Effort, CPAP Flow and Pressure, Oximetry was sampled at 50 Hz. Digital video and audio were recorded.      BASELINE STUDY  Lights Out was at 21:41 and Lights On at 04:58.  Total recording time (TRT) was 437 minutes, with a total sleep time (TST) of 315.5 minutes.   The patient's sleep latency to persistent sleep was 32 minutes.  REM latency was 202.5 minutes, which is delayed. The sleep efficiency was 72.2%, which is reduced.      SLEEP ARCHITECTURE: WASO (Wake after sleep onset) was 108.5 minutes with mild to moderate sleep fragmentation noted. There were 11.5 minutes in Stage N1, 242.5 minutes Stage N2, 6 minutes Stage N3 and 55.5 minutes in Stage REM.  The percentage of Stage N1 was 3.6%, Stage N2 was 76.9%, which is increased, Stage N3 was 1.9% and Stage R (REM sleep) was 17.6%, which is near-normal. The arousals were noted as:  70 were spontaneous, 9 were associated with PLMs, 57 were associated with respiratory events.  RESPIRATORY ANALYSIS:  There were a total of 137 respiratory events:  21 obstructive apneas, 104 central apneas and 0 mixed apneas with a total of 125 apneas and an apnea index (AI) of 23.8 /hour. There were 12 hypopneas with a hypopnea index of 2.3 /hour. The patient also had 0 respiratory event related arousals (RERAs).      The total APNEA/HYPOPNEA INDEX (AHI) was 26.1/hour and the total RESPIRATORY DISTURBANCE INDEX was  26.1 /hour.  25 events occurred in REM sleep and 102 events in NREM. The REM AHI was  27. /hour, versus a non-REM AHI of 25.8. The patient spent 192.5 minutes of total sleep time in the supine position and 123 minutes in non-supine.. The supine AHI was 39.0 versus a non-supine AHI of 5.8.  OXYGEN SATURATION & C02:  The Wake baseline 02 saturation was 96%, with the lowest being 82%. Time spent below 89% saturation equaled 3 minutes.  PERIODIC LIMB MOVEMENTS: The patient had a total of 88 Periodic Limb Movements.  The Periodic Limb Movement (PLM) index was 16.7 and the PLM Arousal index was 1.7/hour.  Audio and video analysis did not show any abnormal or unusual movements, behaviors, phonations or vocalizations. The patient took 1 bathroom break. Mild snoring was noted. The EKG was in keeping with normal sinus rhythm (NSR). Post-study, the patient indicated that sleep was worse than usual.   IMPRESSION:  1. Primary Central Sleep Apnea 2. Obstructive Sleep Apnea (OSA) 3.  Periodic Limb Movement Disorder (PLMD) 4. Dysfunctions associated with sleep stages or arousal from sleep  RECOMMENDATIONS:  1. This study demonstrates primary central sleep apnea in the moderate degree and a lesser obstructive sleep apnea component.  2. Treatment with positive airway pressure is recommended and may require BiPAP, BiPAP ST or ASV. A full night titration study is advised to determine treatment  modality and optimize settings, mask fit, ensure improved oxygen saturations. Other treatment options are limited due to the central nature of his sleep disordered breathing.  3. Please note that untreated sleep apnea may carry additional perioperative morbidity. Patients with significant obstructive sleep apnea should receive perioperative PAP therapy and the surgeons and particularly the anesthesiologist should be informed of the diagnosis and the severity of the sleep disordered breathing. 4. Mild PLMs (periodic limb movements of sleep) were noted during this study with no significant arousals; clinical correlation is recommended.  5. This study shows sleep fragmentation and abnormal sleep stage percentages; these are nonspecific findings and per se do not signify an intrinsic sleep disorder or a cause for the patient's sleep-related symptoms. Causes include (but are not limited to) the first night effect of the sleep study, circadian rhythm disturbances, medication effect or an underlying mood disorder or medical problem.  6. The patient should be cautioned not to drive, work at heights, or operate dangerous or heavy equipment when tired or sleepy. Review and reiteration of good sleep hygiene measures should be pursued with any patient. 7. The patient will be seen in follow-up in the sleep clinic at [facility] for discussion of the test results, symptom and treatment compliance review, further management strategies, etc. The referring provider will be notified of the test results.  I certify that I have reviewed the entire raw data recording prior to the issuance of this report in accordance with the Standards of Accreditation of the American Academy of Sleep Medicine (AASM)  Star Age, MD, PhD Diplomat, American Board of Neurology and Sleep Medicine (Neurology and Sleep Medicine)

## 2020-08-08 NOTE — Addendum Note (Signed)
Addended by: Star Age on: 08/08/2020 05:53 PM   Modules accepted: Orders

## 2020-08-08 NOTE — Progress Notes (Signed)
Patient referred by Dr. Philip Aspen for re-evaluation, last seen by me on 07/23/20, diagnostic PSG on 08/06/20.   Please call and notify the patient that the recent sleep study showed central sleep apnea and obstructive sleep apnea. I recommend treatment for with a CPAP or CPAP like machine. This will require a repeat sleep study for proper titration and mask fitting and correct monitoring of the oxygen saturations. Please explain to patient. I have placed an order in the chart. Thanks.  Star Age, MD, PhD [facility])

## 2020-08-09 ENCOUNTER — Telehealth: Payer: Self-pay

## 2020-08-09 NOTE — Telephone Encounter (Signed)
-----   Message from Star Age, MD sent at 08/08/2020  5:53 PM EDT ----- Patient referred by Dr. Philip Aspen for re-evaluation, last seen by me on 07/23/20, diagnostic PSG on 08/06/20.   Please call and notify the patient that the recent sleep study showed central sleep apnea and obstructive sleep apnea. I recommend treatment for with a CPAP or CPAP like machine. This will require a repeat sleep study for proper titration and mask fitting and correct monitoring of the oxygen saturations. Please explain to patient. I have placed an order in the chart. Thanks.  Star Age, MD, PhD [facility])

## 2020-08-09 NOTE — Telephone Encounter (Signed)
I called pt. I advised pt that Dr. Rexene Alberts reviewed their sleep study results and found that the recent sleep study confirmed dx of OSA and recommends that pt be treated with a cpap. Dr. Rexene Alberts recommends that pt return for a repeat sleep study in order to properly titrate the cpap and ensure a good mask fit. Pt is agreeable to returning for a titration study. I advised pt that our sleep lab will file with pt's insurance and call pt to schedule the sleep study when we hear back from the pt's insurance regarding coverage of this sleep study. Pt verbalized understanding of results. Pt had no questions at this time but was encouraged to call back if questions arise.

## 2020-08-22 ENCOUNTER — Telehealth: Payer: Self-pay

## 2020-08-22 NOTE — Telephone Encounter (Signed)
L\M with wife for pt to call me back to schedule sleep study

## 2020-08-28 ENCOUNTER — Ambulatory Visit (INDEPENDENT_AMBULATORY_CARE_PROVIDER_SITE_OTHER): Payer: PPO | Admitting: Neurology

## 2020-08-28 DIAGNOSIS — R419 Unspecified symptoms and signs involving cognitive functions and awareness: Secondary | ICD-10-CM

## 2020-08-28 DIAGNOSIS — G4733 Obstructive sleep apnea (adult) (pediatric): Secondary | ICD-10-CM | POA: Diagnosis not present

## 2020-08-28 DIAGNOSIS — I480 Paroxysmal atrial fibrillation: Secondary | ICD-10-CM

## 2020-08-28 DIAGNOSIS — G4731 Primary central sleep apnea: Secondary | ICD-10-CM

## 2020-08-28 DIAGNOSIS — G4719 Other hypersomnia: Secondary | ICD-10-CM

## 2020-08-28 DIAGNOSIS — G4720 Circadian rhythm sleep disorder, unspecified type: Secondary | ICD-10-CM

## 2020-08-28 DIAGNOSIS — G4761 Periodic limb movement disorder: Secondary | ICD-10-CM

## 2020-08-28 HISTORY — DX: Obstructive sleep apnea (adult) (pediatric): G47.33

## 2020-09-12 ENCOUNTER — Telehealth: Payer: Self-pay

## 2020-09-12 NOTE — Addendum Note (Signed)
Addended by: Star Age on: 09/12/2020 08:03 AM   Modules accepted: Orders

## 2020-09-12 NOTE — Progress Notes (Signed)
Patient referred by Dr. Philip Aspen for re-evaluation, last seen by me on 07/23/20, diagnostic PSG on 08/06/20.  Patient had a CPAP titration study on 08/28/20.  Please call and inform patient that I have entered an order for treatment with positive airway pressure (PAP) treatment for obstructive sleep apnea (OSA). He did well during the latest sleep study with CPAP. We will, therefore, arrange for a machine for home use through a DME (durable medical equipment) company of His choice; and I will see the patient back in follow-up in about 10 weeks. Please also explain to the patient that I will be looking out for compliance data, which can be downloaded from the machine (stored on an SD card, that is inserted in the machine) or via remote access through a modem, that is built into the machine. At the time of the followup appointment we will discuss sleep study results and how it is going with PAP treatment at home. Please advise patient to bring His machine at the time of the first FU visit, even though this is cumbersome. Bringing the machine for every visit after that will likely not be needed, but often helps for the first visit to troubleshoot if needed. Please re-enforce the importance of compliance with treatment and the need for [place] to monitor compliance data - often an insurance requirement and actually good feedback for the patient as far as how they are doing.  Also remind patient, that any interim PAP machine or mask issues should be first addressed with the DME company, as they can often help better with technical and mask fit issues. Please ask if patient has a preference regarding DME company.  Please also make sure, the patient has a follow-up appointment with me in about 10 weeks from the setup date, thanks. May see one of our nurse practitioners if needed for proper timing of the FU appointment.  Please fax or rout report to the referring provider. Thanks,   Star Age, MD, PhD [facility] [address] [facility])

## 2020-09-12 NOTE — Telephone Encounter (Signed)
-----   Message from Star Age, MD sent at 09/12/2020  8:03 AM EDT ----- Patient referred by Dr. Philip Aspen for re-evaluation, last seen by me on 07/23/20, diagnostic PSG on 08/06/20.  Patient had a CPAP titration study on 08/28/20.  Please call and inform patient that I have entered an order for treatment with positive airway pressure (PAP) treatment for obstructive sleep apnea (OSA). He did well during the latest sleep study with CPAP. We will, therefore, arrange for a machine for home use through a DME (durable medical equipment) company of His choice; and I will see the patient back in follow-up in about 10 weeks. Please also explain to the patient that I will be looking out for compliance data, which can be downloaded from the machine (stored on an SD card, that is inserted in the machine) or via remote access through a modem, that is built into the machine. At the time of the followup appointment we will discuss sleep study results and how it is going with PAP treatment at home. Please advise patient to bring His machine at the time of the first FU visit, even though this is cumbersome. Bringing the machine for every visit after that will likely not be needed, but often helps for the first visit to troubleshoot if needed. Please re-enforce the importance of compliance with treatment and the need for [place] to monitor compliance data - often an insurance requirement and actually good feedback for the patient as far as how they are doing.  Also remind patient, that any interim PAP machine or mask issues should be first addressed with the DME company, as they can often help better with technical and mask fit issues. Please ask if patient has a preference regarding DME company.  Please also make sure, the patient has a follow-up appointment with me in about 10 weeks from the setup date, thanks. May see one of our nurse practitioners if needed for proper timing of the FU appointment.  Please fax or rout report to the  referring provider. Thanks,   Star Age, MD, PhD [facility])

## 2020-09-12 NOTE — Telephone Encounter (Signed)
Pt returned my call. I advised pt that Dr. Rexene Alberts reviewed their sleep study results and found that pt did well with a cpap during his latest sleep study. Dr. Rexene Alberts recommends that pt start a cpap at home. I reviewed PAP compliance expectations with the pt. Pt is agreeable to starting a CPAP. I advised pt that an order will be sent to a DME, [facility], and [facility] will call the pt within about one week after they file with the pt's insurance. [facility] will show the pt how to use the machine, fit for masks, and troubleshoot the CPAP if needed. A follow up appt was made for insurance purposes with Dr. Rexene Alberts on 12/19/2020 at 1:00pm. Pt verbalized understanding to arrive 15 minutes early and bring their CPAP. A letter with all of this information in it will be mailed to the pt as a reminder. I verified with the pt that the address we have on file is correct. Pt verbalized understanding of results. Pt had no questions at this time but was encouraged to call back if questions arise. I have sent the order to [facility] and have received confirmation that they have received the order.

## 2020-09-12 NOTE — Procedures (Signed)
PATIENT'S NAME:  Nicholas [name], Nicholas [name] DOB:      February 20, 1941      MR#:    [id number]     DATE OF RECORDING: 08/28/2020 REFERRING M.D.:  Leanna Battles, MD Study Performed:   CPAP  Titration HISTORY: 79 year old man with a history of macular degeneration, PACs, paroxysmal A. fib, heart murmur, Crohn's disease, cataract, aortic stenosis, vitamin D deficiency, weight loss, anemia, and hyperlipidemia, who presents for a full night titration study.  His baseline sleep study from 08/06/2020 showed moderate sleep disordered breathing with a primary central component and a lesser obstructive component, total AHI was 26.1/h, oxygen desaturation nadir was 82%. Mild PLMs (periodic limb movements of sleep) were noted during the study with no significant arousals. BMI of 22.5 kg/m2.  CURRENT MEDICATIONS: Norvasc, Aspirin, Imuran, Vitamin D2, Lofibra, Synthroid, Imodium, Cozaar, Toprol-XL, Multivitamin, Depotestosterone.  PROCEDURE:  This is a multichannel digital polysomnogram utilizing the SomnoStar 11.2 system.  Electrodes and sensors were applied and monitored per AASM Specifications.   EEG, EOG, Chin and Limb EMG, were sampled at 200 Hz.  ECG, Snore and Nasal Pressure, Thermal Airflow, Respiratory Effort, CPAP Flow and Pressure, Oximetry was sampled at 50 Hz. Digital video and audio were recorded.      The patient was fitted with a medium full facemask (Vitera) by [employer] after trying a nasal mask which he did not tolerate.  CPAP was initiated per AASM standards at 5 cm and gradually advanced to a final pressure of 12 cm.  On a pressure of 12 cm his AHI was 0/h, O2 nadir 96% with nonsupine, non-REM sleep achieved only.   Lights Out was at 20:50 and Lights On at 04:57. Total recording time (TRT) was 487.5 minutes, with a total sleep time (TST) of 233 minutes. The patient's sleep latency to persistent sleep was markedly delayed at 159.5 minutes. REM latency was 244.5 minutes, which is delayed. The sleep efficiency  was 47.8%, which is reduced.    SLEEP ARCHITECTURE: WASO (Wake after sleep onset) was 238.5 minutes with moderate sleep fragmentation noted.  There were 16.5 minutes in Stage N1, 190 minutes Stage N2, 0 minutes Stage N3 and 26.5 minutes in Stage REM.  The percentage of Stage N1 was 7.1%, Stage N2 was 81.5%, which is markedly increased, Stage N3 was absent, and Stage R (REM sleep) was 11.4%, which is reduced. The arousals were noted as: 52 were spontaneous, 20 were associated with PLMs, 2 were associated with respiratory events.  RESPIRATORY ANALYSIS:  There was a total of 11 respiratory events: 5 obstructive apneas, 4 central apneas and 0 mixed apneas with a total of 9 apneas and an apnea index (AHI) of 2.3 /hour. There were 2 hypopneas with a hypopnea index of .5/hour. The patient also had 0 respiratory event related arousals (RERAs).      The total APNEA/HYPOPNEA INDEX  (AHI) was 2.8 /hour and the total RESPIRATORY DISTURBANCE INDEX was 2.8 /hour  5 events occurred in REM sleep and 6 events in NREM. The REM AHI was 11.3 /hour versus a non-REM AHI of 1.7 /hour.  The patient spent 121.5 minutes of total sleep time in the supine position and 112 minutes in non-supine. The supine AHI was 4.4, versus a non-supine AHI of 1.1.  OXYGEN SATURATION & C02:  The baseline 02 saturation was 96%, with the lowest being 93%. Time spent below 89% saturation equaled 0 minutes.  PERIODIC LIMB MOVEMENTS:  The patient had a total of 79 Periodic Limb Movements. The Periodic  Limb Movement (PLM) index was 20.3 and the PLM Arousal index was 5.2 /hour.  Audio and video analysis did not show any abnormal or unusual movements, behaviors, phonations or vocalizations. The patient took 1 bathroom break. The EKG was in keeping with normal sinus rhythm.  Post-study, the patient indicated that sleep was worse than usual.   IMPRESSION: 1. Primary Central Sleep Apnea 2. Obstructive Sleep Apnea (OSA) 3. Periodic Limb Movement  Disorder (PLMD) 4. Dysfunctions associated with sleep stages or arousal from sleep   RECOMMENDATIONS: 1. The patient's primary central and obstructive sleep apnea responded well to CPAP therapy. I will, therefore, recommend home CPAP treatment at a pressure of 12 cm via medium FFM with (heated) humidity. The patient will be advised to be fully compliant with PAP therapy to improve sleep related symptoms and decrease long term cardiovascular risks. The patient should be reminded, that it may take up to 3 months to get fully used to using PAP with all planned sleep. The earlier full compliance is achieved, the better long term compliance tends to be. Please note that untreated obstructive sleep apnea may carry additional perioperative morbidity. Patients with significant obstructive sleep apnea should receive perioperative PAP therapy and the surgeons and particularly the anesthesiologist should be informed of the diagnosis and the severity of the sleep disordered breathing. 2. Mild PLMs (periodic limb movements of sleep) were noted during this study with minimal arousals; clinical correlation is recommended. PLMs may improve with positive airway pressure treatment. 3. This study shows sleep fragmentation and abnormal sleep stage percentages; these are nonspecific findings and per se do not signify an intrinsic sleep disorder or a cause for the patient's sleep-related symptoms. Causes include (but are not limited to) the first night effect of the sleep study, circadian rhythm disturbances, medication effect or an underlying mood disorder or medical problem.  4.The patient should be cautioned not to drive, work at heights, or operate dangerous or heavy equipment when tired or sleepy. Review and reiteration of good sleep hygiene measures should be pursued with any patient. 5.the patient will be seen in follow-up in the sleep clinic at [facility] for discussion of the test results, symptom and treatment compliance  review, further management strategies, etc. The referring provider will be notified of the test results.   I certify that I have reviewed the entire raw data recording prior to the issuance of this report in accordance with the Standards of Accreditation of the American Academy of Sleep Medicine (AASM)   Star Age, MD, PhD Diplomat, American Board of Neurology and Sleep Medicine (Neurology and Sleep Medicine)

## 2020-09-12 NOTE — Telephone Encounter (Signed)
I called pt. He asked me to call in 15 minutes. I asked him to instead call me when he was ready.

## 2020-10-02 DIAGNOSIS — K861 Other chronic pancreatitis: Secondary | ICD-10-CM | POA: Diagnosis not present

## 2020-10-02 DIAGNOSIS — K5090 Crohn's disease, unspecified, without complications: Secondary | ICD-10-CM | POA: Diagnosis not present

## 2020-10-02 DIAGNOSIS — K50813 Crohn's disease of both small and large intestine with fistula: Secondary | ICD-10-CM | POA: Diagnosis not present

## 2020-10-08 DIAGNOSIS — E039 Hypothyroidism, unspecified: Secondary | ICD-10-CM | POA: Diagnosis not present

## 2020-10-08 DIAGNOSIS — E785 Hyperlipidemia, unspecified: Secondary | ICD-10-CM | POA: Diagnosis not present

## 2020-10-08 DIAGNOSIS — E291 Testicular hypofunction: Secondary | ICD-10-CM | POA: Diagnosis not present

## 2020-10-08 DIAGNOSIS — Z125 Encounter for screening for malignant neoplasm of prostate: Secondary | ICD-10-CM | POA: Diagnosis not present

## 2020-10-08 DIAGNOSIS — R7301 Impaired fasting glucose: Secondary | ICD-10-CM | POA: Diagnosis not present

## 2020-10-10 ENCOUNTER — Other Ambulatory Visit: Payer: Self-pay | Admitting: Cardiology

## 2020-10-10 DIAGNOSIS — I10 Essential (primary) hypertension: Secondary | ICD-10-CM

## 2020-10-11 DIAGNOSIS — R82998 Other abnormal findings in urine: Secondary | ICD-10-CM | POA: Diagnosis not present

## 2020-10-11 DIAGNOSIS — I10 Essential (primary) hypertension: Secondary | ICD-10-CM | POA: Diagnosis not present

## 2020-10-12 DIAGNOSIS — K921 Melena: Secondary | ICD-10-CM | POA: Diagnosis not present

## 2020-10-15 DIAGNOSIS — N1831 Chronic kidney disease, stage 3a: Secondary | ICD-10-CM | POA: Diagnosis not present

## 2020-10-15 DIAGNOSIS — E039 Hypothyroidism, unspecified: Secondary | ICD-10-CM | POA: Diagnosis not present

## 2020-10-15 DIAGNOSIS — M25561 Pain in right knee: Secondary | ICD-10-CM | POA: Diagnosis not present

## 2020-10-15 DIAGNOSIS — I131 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, with stage 1 through stage 4 chronic kidney disease, or unspecified chronic kidney disease: Secondary | ICD-10-CM | POA: Diagnosis not present

## 2020-10-15 DIAGNOSIS — I480 Paroxysmal atrial fibrillation: Secondary | ICD-10-CM | POA: Diagnosis not present

## 2020-10-15 DIAGNOSIS — R7301 Impaired fasting glucose: Secondary | ICD-10-CM | POA: Diagnosis not present

## 2020-10-15 DIAGNOSIS — Z0000 Encounter for general adult medical examination without abnormal findings: Secondary | ICD-10-CM | POA: Diagnosis not present

## 2020-10-15 DIAGNOSIS — K912 Postsurgical malabsorption, not elsewhere classified: Secondary | ICD-10-CM | POA: Diagnosis not present

## 2020-10-15 DIAGNOSIS — I10 Essential (primary) hypertension: Secondary | ICD-10-CM | POA: Diagnosis not present

## 2020-10-15 DIAGNOSIS — K5090 Crohn's disease, unspecified, without complications: Secondary | ICD-10-CM | POA: Diagnosis not present

## 2020-10-15 DIAGNOSIS — E785 Hyperlipidemia, unspecified: Secondary | ICD-10-CM | POA: Diagnosis not present

## 2020-10-15 DIAGNOSIS — E559 Vitamin D deficiency, unspecified: Secondary | ICD-10-CM | POA: Diagnosis not present

## 2020-10-24 DIAGNOSIS — G4733 Obstructive sleep apnea (adult) (pediatric): Secondary | ICD-10-CM | POA: Diagnosis not present

## 2020-10-25 ENCOUNTER — Telehealth: Payer: Self-pay | Admitting: Neurology

## 2020-10-25 DIAGNOSIS — H0011 Chalazion right upper eyelid: Secondary | ICD-10-CM | POA: Diagnosis not present

## 2020-10-25 NOTE — Telephone Encounter (Signed)
Called patient and asked if mask fit well. He stated it does but it made his " jaws puff out full of air". He tried it twice x 1 hour. I advised Dr Rexene Alberts would need several days of readings with CPAP in order to determine if any setting changes need to be made. I advised he take machine and mask to [facility] and talk with respiratory therapist to check for mask fit and leaks. He stated he can't go today with machine but will  be at [facility] this afternoon to sign papers. He will go as soon as able and call back for any questions, problems. Patient verbalized understanding, appreciation.

## 2020-10-25 NOTE — Telephone Encounter (Signed)
Pt called, got CPAP yesterday, tried to sleep with it. I could not get any sleep, took it off. Kept blowing up my jaws. Called [facility], suggested the pressure may need to be reduced. Would like a call from the nurse.

## 2020-10-29 ENCOUNTER — Telehealth: Payer: Self-pay | Admitting: Neurology

## 2020-10-29 DIAGNOSIS — G4733 Obstructive sleep apnea (adult) (pediatric): Secondary | ICD-10-CM

## 2020-10-29 NOTE — Telephone Encounter (Signed)
Pt called, have not been able to sleep with CPAP machine. When pressure gets to 17 it starts to leak. Has been doing this for 5 weeks. Can the pressure be changed? Would like a call from the nurse.

## 2020-10-29 NOTE — Telephone Encounter (Signed)
I spoke with Dr. Rexene Alberts. She gave a verbal order to reduce the pressure on his cpap to 15 cwp for tolerance.   I called pt. I discussed this with him. Will send this order to [facility]. Pt will follow up with [facility] regarding this order. Pt will let us know of further questions or concerns.  Order for pressure reduction sent to [facility] via fax. Confirmation received that the order transmitted was successful.

## 2020-11-24 DIAGNOSIS — G4733 Obstructive sleep apnea (adult) (pediatric): Secondary | ICD-10-CM | POA: Diagnosis not present

## 2020-11-26 ENCOUNTER — Encounter: Payer: Self-pay | Admitting: Neurology

## 2020-11-29 ENCOUNTER — Ambulatory Visit: Payer: PPO | Admitting: Cardiology

## 2020-11-29 ENCOUNTER — Encounter: Payer: Self-pay | Admitting: Cardiology

## 2020-11-29 ENCOUNTER — Other Ambulatory Visit: Payer: Self-pay

## 2020-11-29 VITALS — BP 125/60 | HR 52 | Temp 98.7°F | Resp 16 | Ht 69.0 in | Wt 168.0 lb

## 2020-11-29 DIAGNOSIS — R0609 Other forms of dyspnea: Secondary | ICD-10-CM | POA: Diagnosis not present

## 2020-11-29 DIAGNOSIS — Z8679 Personal history of other diseases of the circulatory system: Secondary | ICD-10-CM

## 2020-11-29 DIAGNOSIS — R0600 Dyspnea, unspecified: Secondary | ICD-10-CM

## 2020-11-29 DIAGNOSIS — R001 Bradycardia, unspecified: Secondary | ICD-10-CM | POA: Diagnosis not present

## 2020-11-29 DIAGNOSIS — I10 Essential (primary) hypertension: Secondary | ICD-10-CM

## 2020-11-29 DIAGNOSIS — N1831 Chronic kidney disease, stage 3a: Secondary | ICD-10-CM

## 2020-11-29 MED ORDER — METOPROLOL SUCCINATE ER 25 MG PO TB24: 12.5000 mg | ORAL_TABLET | Freq: Every day | ORAL | 3 refills | Status: DC

## 2020-11-29 NOTE — Progress Notes (Signed)
Primary Physician:  Leanna Battles, MD   Patient ID: Nicholas [name], male    DOB: September 15, 1941, 80 y.o.   MRN: [id number]  Chief Complaint  Patient presents with  . Atrial Fibrillation    HPI   Nicholas [name]  is a 80 y.o. male  with chronic dyspnea on exertion, dyslipidemia, diet-controlled diabetes mellitus, Crohn's disease on Aziothioprine,  admitted to [facility] in January 2014 when he had reversal of ileostomy postoperatively on 11/12/2012 had wide complex tachycardia and also narrow complex tachycardia suggestive of PSVT and also A. Flutter with abberancy. He has diagnosis of paroxysmal atrial fibrillation by history in January 2005, he is only on ASA therapy per patient choice.   Has noticed decreased exercise tolerance and dyspnea on exertion over the past few months.  Denies chest pain, palpitations, dizziness, syncope, leg swelling.  Past Medical History:  Diagnosis Date  . Anemia    PO IRON   . Aortic stenosis, mild   . Cataract   . Colonic hemorrhage 1964  . Crohn's disease ([place])   . GERD (gastroesophageal reflux disease)   . Heart murmur   . Hyperlipidemia   . Macular degeneration   . PAC (premature atrial contraction)   . PAF (paroxysmal atrial fibrillation) ([place])    SEES DR. BRACKBILL  . Palpitations    Past Surgical History:  Procedure Laterality Date  . APPENDECTOMY    . Biopsy Facial  05/10/2019  . BLADDER REPAIR  1964   intestine leaks/bladder repair  . CARDIOVASCULAR STRESS TEST  11/29/2003   EF 64%  . CARDIOVASCULAR STRESS TEST  11/17/2012   No evidence of ischemia; EF 66%; inferior wall attenuation favored to rep diaphragmatic attenuation  . CATARACT EXTRACTION W/ INTRAOCULAR LENS  IMPLANT, BILATERAL     2013  . ILEOSTOMY  04/16/2012   Procedure: ILEOSTOMY;  Surgeon: Adin Hector, MD;  Location: [place];  Service: General;  Laterality: N/A;  . ILEOSTOMY CLOSURE  12/07/2012  . ILEOSTOMY CLOSURE  12/07/2012   Procedure: ILEOSTOMY TAKEDOWN;   Surgeon: Adin Hector, MD;  Location: [place];  Service: General;  Laterality: N/A;  . LAPAROTOMY  04/16/2012   Procedure: EXPLORATORY LAPAROTOMY;  Surgeon: Adin Hector, MD;  Location: [place];  Service: General;  Laterality: N/A;  . LYSIS OF ADHESION  12/07/2012   Procedure: LYSIS OF ADHESION;  Surgeon: Adin Hector, MD;  Location: [place];  Service: General;  Laterality: N/A;  . PROCTOSCOPY  12/07/2012   Procedure: PROCTOSCOPY;  Surgeon: Adin Hector, MD;  Location: [place];  Service: General;;  ILEO PROCTOSCOPY  . STOMACH SURGERY  1959   ulcer  . TONSILLECTOMY    . US ECHOCARDIOGRAPHY  05/20/2010   EF 55-60%  . US ECHOCARDIOGRAPHY  10/23/2005   EF 55-60%   Family History  Problem Relation Age of Onset  . Heart disease Maternal Grandfather   . Heart disease Other        maternal side  . Congestive Heart Failure Daughter   . Colon cancer Neg Hx    Social History   Tobacco Use  . Smoking status: Former Smoker    Packs/day: 1.00    Years: 2.00    Pack years: 2.00    Quit date: 11/10/1964    Years since quitting: 56.0  . Smokeless tobacco: Never Used  Substance Use Topics  . Alcohol use: No    Marital Status: Married  ROS:   Review of Systems  Cardiovascular:  Positive for dyspnea on exertion. Negative for chest pain, claudication, leg swelling, orthopnea, palpitations, paroxysmal nocturnal dyspnea and syncope.  Hematologic/Lymphatic: Does not bruise/bleed easily.  Gastrointestinal: Negative for melena.   Objective:  Blood pressure 125/60, pulse (!) 52, temperature 98.7 F (37.1 C), resp. rate 16, height _0  (1.753 m), weight 168 lb (76.2 kg), SpO2 99 %. Body mass index is 24.81 kg/m.  Vitals with BMI 11/29/2020 07/23/2020 07/20/2020  Height _1  _2  _3   Weight 168 lbs 157 lbs 5 oz 158 lbs  BMI 24.8 70.26 37.85  Systolic 885 027 741  Diastolic 60 80 65  Pulse 52 60 52    Physical Exam Vitals reviewed.  Constitutional:      Appearance: He is  well-developed.  Cardiovascular:     Rate and Rhythm: Regular rhythm. Bradycardia present.     Pulses: Intact distal pulses.          Carotid pulses are 2+ on the right side and 2+ on the left side.      Radial pulses are 2+ on the right side and 2+ on the left side.       Dorsalis pedis pulses are 2+ on the right side and 2+ on the left side.       Posterior tibial pulses are 1+ on the right side and 1+ on the left side.     Heart sounds: Murmur heard.   Mid to late systolic murmur is present with a grade of 2/6 at the lower left sternal border.     Comments: No leg edema.  Pulmonary:     Effort: Pulmonary effort is normal. No accessory muscle usage or respiratory distress.     Breath sounds: Normal breath sounds. No wheezing, rhonchi or rales.    Laboratory examination:   Recent Labs    02/13/20 1243 04/26/20 1506  NA 141 139  K 4.5 4.5  CL 108 104  CO2 19* 24  GLUCOSE 100* 106*  BUN 19 13  CREATININE 1.32* 1.27  CALCIUM 10.1 9.5  GFRNONAA 51* 53*  GFRAA 59* 61    CrCl cannot be calculated (Patient's most recent lab result is older than the maximum 21 days allowed.).  CMP Latest Ref Rng & Units 04/26/2020 02/13/2020 07/14/2016  Glucose 65 - 99 mg/dL 106(H) 100(H) 109(H)  BUN 8 - 27 mg/dL _4 Creatinine 0.76 - 1.27 mg/dL 1.27 1.32(H) 1.32(H)  Sodium 134 - 144 mmol/L 139 141 135  Potassium 3.5 - 5.2 mmol/L 4.5 4.5 4.0  Chloride 96 - 106 mmol/L 104 108 107  CO2 20 - 29 mmol/L 24 19(L) 24  Calcium 8.6 - 10.2 mg/dL 9.5 10.1 8.7(L)  Total Protein 6.0 - 8.3 g/dL - - -  Total Bilirubin 0.3 - 1.2 mg/dL - - -  Alkaline Phos 39 - 117 U/L - - -  AST 0 - 37 U/L - - -  ALT 0 - 53 U/L - - -   CBC Latest Ref Rng & Units 02/13/2020 07/14/2016 02/24/2015  WBC 4.0 - 10.5 K/uL 7.3 4.9 8.1  Hemoglobin 13.0 - 17.0 g/dL 14.9 13.9 12.3(L)  Hematocrit 39.0 - 52.0 % 45.0 42.5 36.8(L)  Platelets 150 - 400 K/uL 282 205 158   BNP (last 3 results) Recent Labs    02/13/20 1210  BNP 65.2    No results found for: HGBA1C  Lab Results  Component Value Date   TSH 4.641 (H) 12/10/2012    External labs: 01/05/2020:  HbA1C 5.5% Albumin 4.2, Total Bilirubin 0.4, Bilirubin, Direct 0.1, Alkaline Phosphatase 57, AST 22, ALT 13, Total Protein 6.3.   12/28/2019:  BUN 20, Creatinine 1.21, Na/K 137/4.7, Glucose 167, eGFR 56,  Hemoglobin 13.4, MCV 97.9, RBC 4.04, WBC 7.7.   Cholesterol, total 139.000 m 09/26/2019 Triglycerides 111.000 09/26/2019 HDL 50 MG/DL 09/26/2019 LDL 67.000 mg 09/26/2019  A1C 5.300 % 09/26/2019; TSH 5.080 09/26/2019   Medications and allergy:    No Known Allergies   Current Outpatient Medications on File Prior to Visit  Medication Sig Dispense Refill  . amLODipine (NORVASC) 2.5 MG tablet Take 2.5 mg by mouth daily.    [name] aspirin EC 81 MG tablet Take 81 mg by mouth. M, W, F only    . azaTHIOprine (IMURAN) 50 MG tablet Take 50 mg by mouth daily.    . ergocalciferol (VITAMIN D2) 50000 UNITS capsule Take 50,000 Units by mouth once a week.     . fenofibrate 160 MG tablet Take 160 mg by mouth daily.    . IRON PO Take 2 tablets by mouth daily. Iron 74m    . levothyroxine (SYNTHROID) 25 MCG tablet Take 25 mcg by mouth daily before breakfast.    . loperamide (IMODIUM) 2 MG capsule Take 2 mg by mouth daily as needed for diarrhea or loose stools.    .Marland Kitchenlosartan (COZAAR) 25 MG tablet TAKE 2 TABLETS (50 MG TOTAL) BY MOUTH EVERY EVENING. 60 tablet 2  . Multiple Vitamin (MULTI VITAMIN DAILY PO) Take 1 tablet by mouth daily.    .Marland Kitchentestosterone cypionate (DEPOTESTOSTERONE CYPIONATE) 200 MG/ML injection Inject 1 mL into the muscle every 21 ( twenty-one) days.  5  . Wheat Dextrin (BENEFIBER DRINK MIX PO) Take 1 Dose by mouth 3 (three) times daily as needed (when not eating a high fiber diet).     . Multiple Vitamins-Minerals (PRESERVISION AREDS 2 PO) Take 2 tablets by mouth daily.     No current facility-administered medications on file prior to visit.      Radiology:   Chest X-Ray 02/13/2020: No active cardiopulmonary disease.  MRI abdomen with and without contrast 03/07/2020: 1. Biliary and pancreatic ductal dilatation with abrupt cutoff of the common bile duct proximal to the ampulla as described above. Subtle periampullary enhancement. Recommend correlation with ERCP, as underlying lesion could have this appearance. 2. 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 3. Mild hepatic steatosis.  CT abdomen 01/02/2020: 1. Status post subtotal colectomy with ileorectal anastomosis. No CT evidence of active disease. 2. Mild biliary ductal fullness with smooth tapering toward the level of the ampulla, nonspecific. Correlate with biliary labs and ERCP if elevated.  3. Sequelae of chronic pancreatitis with the pancreatic duct ectasia and punctate parenchymal calcifications. Subcentimeter cystic focus within the pancreatic head may reflect a side branch intraductal papillary mucinous neoplasm versus sequelae of pseudocyst. Consider 6-12 month follow-up with pancreas protocol MR.  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 Also had narrow complex tachy for 15 minutes that again is suspicious for A. Fl with 2:1 conduction vs atrial tachycardia vs PSVT.  Event Monitor 09/16/2017: Sinus rhythm, heart rate 37-126 bpm. symptoms of fatigue with sinus rhythm at 80 bpm. No atrial fibrillation, atrial flutter  Echocardiogram 02/28/2020:  Normal LV systolic function with visual EF 50-55%. Left ventricle cavity is normal in size. Moderate left ventricular hypertrophy. Normal global wall motion. Normal diastolic filling pattern, normal LAP.  Structurally normal mitral valve.  No evidence of mitral stenosis. Mild (Grade I) mitral regurgitation.  Structurally normal tricuspid valve. No evidence of tricuspid stenosis.  Mild tricuspid regurgitation.  Compared to previous  study dated 09/28/2017 Mild MR and TR are new. LVEF remains stable.  Exercise tetrofosmin stress test  02/29/2020: Normal ECG stress. The baseline blood pressure was 172/90 mmHg and increased to 208/80 mmHg, which is a hypertensive response to exercise. The patient exercised for 4 minutes and 45 seconds of a Bruce protocol, achieving approximately 7 METs.  Myocardial perfusion is normal. Overall LV systolic function is normal without regional wall motion abnormalities. Stress LV EF: 71%.  Compared to 09/21/2017, no significant change.   EKG:    EKG 11/29/2020: Marked sinus bradycardia at rate of 46 bpm, otherwise normal EKG.  Compared to 02/17/2020, heart rate was 65 bpm and on 11/30/2019, heart rate was 56 bpm.     Assessment:     ICD-10-CM   1. Primary hypertension  I10 EKG 12-Lead    metoprolol succinate (TOPROL-XL) 25 MG 24 hr tablet  2. Dyspnea on exertion  R06.00   3. Bradycardia by electrocardiogram  R00.1   4. History of atrial fibrillation  Z86.79 metoprolol succinate (TOPROL-XL) 25 MG 24 hr tablet  5. Stage 3a chronic kidney disease ([place])  N18.31      Recommendations:   Nicholas [name]  is a 80 y.o. 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 Flutter with abberancy. He has diagnosis of paroxysmal atrial fibrillation by history in January 2005, he is only on ASA therapy per patient choice and has not had any recurrence of palpitations.   Patient presents for 85-monthfollow-up of hypertension and bradycardia.  His symptoms of dyspnea that started again in the last few months may be  related to chronotropic incompetence and he has underlying marked sinus bradycardia.  I will reduce the dose of metoprolol succinate from 25 mg to 12.5 mg daily, if symptoms improve we can continue observation for now.  He has not had any syncope or dizziness.  No heart block by EKG.  However if decreased exercise tolerance and dyspnea persist, we will completely discontinue metoprolol and consider addition of amlodipine or chlorthalidone for hypertension management.  He does have stage III chronic kidney disease and this needs to be kept in mind and losartan cannot be increased in dosage.    I would like to follow him up in 3 months again with repeat EKG.  JAdrian Prows PA-C 11/29/2020, 2:38 PM Office: [phone]

## 2020-12-05 DIAGNOSIS — L080 Pyoderma: Secondary | ICD-10-CM | POA: Diagnosis not present

## 2020-12-05 DIAGNOSIS — D485 Neoplasm of uncertain behavior of skin: Secondary | ICD-10-CM | POA: Diagnosis not present

## 2020-12-05 DIAGNOSIS — C44629 Squamous cell carcinoma of skin of left upper limb, including shoulder: Secondary | ICD-10-CM | POA: Diagnosis not present

## 2020-12-10 ENCOUNTER — Other Ambulatory Visit: Payer: Self-pay | Admitting: Cardiology

## 2020-12-10 DIAGNOSIS — I10 Essential (primary) hypertension: Secondary | ICD-10-CM

## 2020-12-17 ENCOUNTER — Telehealth: Payer: Self-pay | Admitting: Neurology

## 2020-12-17 NOTE — Telephone Encounter (Signed)
Pt has called to report that he is not having any luck with his CPAP.  Pt states it leaks, wakes put up at night and he is becoming very discouraged in using it.  Please call pt to discuss.

## 2020-12-18 DIAGNOSIS — H52203 Unspecified astigmatism, bilateral: Secondary | ICD-10-CM | POA: Diagnosis not present

## 2020-12-18 DIAGNOSIS — H353132 Nonexudative age-related macular degeneration, bilateral, intermediate dry stage: Secondary | ICD-10-CM | POA: Diagnosis not present

## 2020-12-18 DIAGNOSIS — H26491 Other secondary cataract, right eye: Secondary | ICD-10-CM | POA: Diagnosis not present

## 2020-12-18 NOTE — Telephone Encounter (Signed)
I called pt. Pt is scheduled for f/u with Dr. Rexene Alberts tomorrow. Pt was advised we would discuss his concerns further during the visit. Pt verbalized understanding and was agreeable to this plan.

## 2020-12-19 ENCOUNTER — Ambulatory Visit: Payer: PPO | Admitting: Neurology

## 2020-12-19 ENCOUNTER — Encounter: Payer: Self-pay | Admitting: Neurology

## 2020-12-19 ENCOUNTER — Other Ambulatory Visit: Payer: Self-pay

## 2020-12-19 VITALS — BP 132/64 | HR 56 | Ht 69.0 in | Wt 158.3 lb

## 2020-12-19 DIAGNOSIS — G4733 Obstructive sleep apnea (adult) (pediatric): Secondary | ICD-10-CM

## 2020-12-19 DIAGNOSIS — Z9989 Dependence on other enabling machines and devices: Secondary | ICD-10-CM | POA: Diagnosis not present

## 2020-12-19 DIAGNOSIS — G4731 Primary central sleep apnea: Secondary | ICD-10-CM | POA: Diagnosis not present

## 2020-12-19 DIAGNOSIS — Z789 Other specified health status: Secondary | ICD-10-CM | POA: Diagnosis not present

## 2020-12-19 NOTE — Patient Instructions (Signed)
It was good to see you again today.  As explained, you should have started CPAP therapy at 12 cm.  We talked the [facility] and they reported that when the fax came through the ink was smudged in the 12 look like a 17 to them. I appreciate you try CPAP therapy and continue to work with [place].  I would like to reduce the pressure to 12 cm.  We will make sure it happens today.    Please call us in about a month so we can review another download to see how you have done on the pressure of 12 cm.  If you have any trouble in between, do call us anytime.  We will follow-up in about 3 months to see how you are doing, you can meet with one of our nurse practitioners at the time.   Please continue using your CPAP regularly. While your insurance requires that you use CPAP at least 4 hours each night on 70% of the nights, I recommend, that you not skip any nights and use it throughout the night if you can. Getting used to CPAP and staying with the treatment long term does take time and patience and discipline. Untreated obstructive sleep apnea when it is moderate to severe can have an adverse impact on cardiovascular health and raise her risk for heart disease, arrhythmias, hypertension, congestive heart failure, stroke and diabetes. Untreated obstructive sleep apnea causes sleep disruption, nonrestorative sleep, and sleep deprivation. This can have an impact on your day to day functioning and cause daytime sleepiness and impairment of cognitive function, memory loss, mood disturbance, and problems focussing. Using CPAP regularly can improve these symptoms.

## 2020-12-19 NOTE — Progress Notes (Signed)
Subjective:    Patient ID: Nicholas [name] is a 80 y.o. male.  HPI    Interim history:   Mr. Nicholas [name] is an 80 year old right-handed gentleman with an underlying medical history of macular degeneration, PACs, paroxysmal A. fib, heart murmur, Crohn's disease, cataract, aortic stenosis, vitamin D deficiency, weight loss, anemia, and hyperlipidemia, who Presents for Follow-up consultation of his obstructive sleep apnea after interim testing and starting CPAP therapy.  The patient is unaccompanied today.  I last saw him on 07/23/2020 for reevaluation of his sleep disturbance.  He was complaining of daytime tiredness.  He was agreeable to pursuing a sleep study.  He had a baseline sleep study, followed by a CPAP titration study.  His baseline sleep study from 08/06/2020 showed a sleep latency of 32 minutes, REM latency delayed at 202.5 minutes, sleep efficiency 72.2%.  Wake after sleep onset was elevated at 108.5 minutes with mild to moderate sleep fragmentation noted.  He had an increased percentage of stage II sleep, near absence of slow-wave sleep and REM sleep was 17.6%.  Total AHI was in the moderate range at 26.1/h, REM AHI 27/h, supine AHI 39/h.  Average oxygen saturation was 96%, nadir was 82%.  He was noted to have significant central apneas and these were more than 50% of his respiratory events.  He had mild periodic limb movements without significant arousals.  He was advised to return for a full night titration study.  He had this on 08/28/2020.  Sleep efficiency was 47.8%, sleep latency to persistent sleep delayed at 159.5 minutes and REM latency markedly delayed at 244.5 minutes.  He was fitted with a full facemask.  He was started on CPAP therapy at 5 cm and titrated to a final pressure of 12 cm.  His AHI was 0/h, O2 nadir 96% with nonsupine and non-REM sleep achieved only.  He was advised to start CPAP therapy at home.  He was again noted to have mild periodic leg movements with minimal arousals.  His  set up date was 10/24/2020.   Today, 12/19/20: I reviewed his CPAP compliance data from the past 54 days, since set up, he used his machine 21 out of 54 days with percent use days greater than 4 hours at 15%, essentially no usage since early January, around 11/16/2020, 1 night of usage on 12/15/2020.  He had called in the interim in December reporting that the pressure was difficult to tolerate and he was having trouble with the mask.  He was advised to meet up with his DME provider for mask issues.  We also reduced his pressure in the interim to 15 cm, from 17 cm, but my original order was for 12 cm.  We checked with his DME company representative and they reported that the original order came through with a smudge and the 12 cm look like 17 cm.  I apologized for the faux pas and asked him to start using his CPAP again at a pressure of 12 cm and we will make sure that the change will take place today.  He is willing to restart using it.  He does report that he used it recently 1 time.  The air leakage bothers him and the pressure is too high at 15.  Average AHI is borderline at 6.5, mostly because of central events, central apnea score is 4.8 on the last download.   The patient's allergies, current medications, family history, past medical history, past social history, past surgical history and problem list  were reviewed and updated as appropriate.    Previously:   I first met him at the request of his primary care physician, Dr. Philip Aspen, on 10/20/2019, at which time the patient reported snoring and daytime tiredness.  His tiredness improved a little bit in the recent months.  He reported some weight loss.  He was trying to supplement his nutrition with Ensure.  He was struggling with diarrhea.  He was advised to proceed with sleep study testing but wanted to delay the test till after the first of the year.  He did not pursue sleep study testing earlier in the year.     10/20/19: (He) reports snoring and  daytime tiredness.  His tiredness has improved a little bit in the past couple of months.  He has had some unintentional weight loss and has started using Ensure, half a bottle twice daily.  He still struggles with diarrhea.  He goes to bed generally around 9-ish and rise time is around 7.  He does not have night to night nocturia and denies any recurrent morning headaches.  His middle daughter has sleep apnea but does not use her CPAP machine.  He has woken up with a snorting sound and his wife has noted occasional snorting sounds when he is asleep.  He does have some restless sleep at times, he does not take any sleep aid.  He had a tonsillectomy at age 58.  He has a prescription for Synthroid pending which he has not yet started. I reviewed your telemedicine note from 10/03/2019, which you kindly included.  His Epworth sleepiness score is 10 out of 24, fatigue severity score is 42 out of 63.  He is a retired Furniture conservator/restorer.  He lives with his wife, no pets in the house.  He lost his oldest daughter last year and, tragically, his only granddaughter from her took her own life the year before at age 87.  His Past Medical History Is Significant For: Past Medical History:  Diagnosis Date  . Anemia    PO IRON   . Aortic stenosis, mild   . Cataract   . Colonic hemorrhage 1964  . Crohn's disease ([place])   . GERD (gastroesophageal reflux disease)   . Heart murmur   . Hyperlipidemia   . Macular degeneration   . PAC (premature atrial contraction)   . PAF (paroxysmal atrial fibrillation) ([place])    SEES DR. BRACKBILL  . Palpitations     His Past Surgical History Is Significant For: Past Surgical History:  Procedure Laterality Date  . APPENDECTOMY    . Biopsy Facial  05/10/2019  . BLADDER REPAIR  1964   intestine leaks/bladder repair  . CARDIOVASCULAR STRESS TEST  11/29/2003   EF 64%  . CARDIOVASCULAR STRESS TEST  11/17/2012   No evidence of ischemia; EF 66%; inferior wall attenuation favored to rep  diaphragmatic attenuation  . CATARACT EXTRACTION W/ INTRAOCULAR LENS  IMPLANT, BILATERAL     2013  . ILEOSTOMY  04/16/2012   Procedure: ILEOSTOMY;  Surgeon: Adin Hector, MD;  Location: [place];  Service: General;  Laterality: N/A;  . ILEOSTOMY CLOSURE  12/07/2012  . ILEOSTOMY CLOSURE  12/07/2012   Procedure: ILEOSTOMY TAKEDOWN;  Surgeon: Adin Hector, MD;  Location: [place];  Service: General;  Laterality: N/A;  . LAPAROTOMY  04/16/2012   Procedure: EXPLORATORY LAPAROTOMY;  Surgeon: Adin Hector, MD;  Location: [place];  Service: General;  Laterality: N/A;  . LYSIS OF ADHESION  12/07/2012  Procedure: LYSIS OF ADHESION;  Surgeon: Adin Hector, MD;  Location: [place];  Service: General;  Laterality: N/A;  . PROCTOSCOPY  12/07/2012   Procedure: PROCTOSCOPY;  Surgeon: Adin Hector, MD;  Location: [place];  Service: General;;  ILEO PROCTOSCOPY  . STOMACH SURGERY  1959   ulcer  . TONSILLECTOMY    . US ECHOCARDIOGRAPHY  05/20/2010   EF 55-60%  . US ECHOCARDIOGRAPHY  10/23/2005   EF 55-60%    His Family History Is Significant For: Family History  Problem Relation Age of Onset  . Heart disease Maternal Grandfather   . Heart disease Other        maternal side  . Congestive Heart Failure Daughter   . Colon cancer Neg Hx     His Social History Is Significant For: Social History   Socioeconomic History  . Marital status: Married    Spouse name: Not on file  . Number of children: 3  . Years of education: Not on file  . Highest education level: Not on file  Occupational History  . Occupation: Retired    Fish farm manager: [employer]  Tobacco Use  . Smoking status: Former Smoker    Packs/day: 1.00    Years: 2.00    Pack years: 2.00    Quit date: 11/10/1964    Years since quitting: 56.1  . Smokeless tobacco: Never Used  Vaping Use  . Vaping Use: Never used  Substance and Sexual Activity  . Alcohol use: No  . Drug use: No  . Sexual activity: Not on file  Other Topics Concern  .  Not on file  Social History Narrative   caffeinated drinks less than one a day. Eldest daughter passed from heart deficiency.    Social Determinants of Health   Financial Resource Strain: Not on file  Food Insecurity: Not on file  Transportation Needs: Not on file  Physical Activity: Not on file  Stress: Not on file  Social Connections: Not on file    His Allergies Are:  Not on File:   His Current Medications Are:  Outpatient Encounter Medications as of 12/19/2020  Medication Sig  . amLODipine (NORVASC) 2.5 MG tablet Take 2.5 mg by mouth daily.  [name] aspirin EC 81 MG tablet Take 81 mg by mouth. M, W, F only  . azaTHIOprine (IMURAN) 50 MG tablet Take 50 mg by mouth daily.  . ergocalciferol (VITAMIN D2) 50000 UNITS capsule Take 50,000 Units by mouth once a week.   . fenofibrate 160 MG tablet Take 160 mg by mouth daily.  . IRON PO Take 2 tablets by mouth daily. Iron 64m  . levothyroxine (SYNTHROID) 25 MCG tablet Take 25 mcg by mouth daily before breakfast.  . loperamide (IMODIUM) 2 MG capsule Take 2 mg by mouth daily as needed for diarrhea or loose stools.  .Marland Kitchenlosartan (COZAAR) 25 MG tablet TAKE 2 TABLETS (50 MG) BY MOUTH EVERY EVENING.  . metoprolol succinate (TOPROL-XL) 25 MG 24 hr tablet Take 0.5 tablets (12.5 mg total) by mouth daily. Take with or immediately following a meal.  . Multiple Vitamin (MULTI VITAMIN DAILY PO) Take 1 tablet by mouth daily.  . Multiple Vitamins-Minerals (PRESERVISION AREDS 2) CAPS Take by mouth.  . testosterone cypionate (DEPOTESTOSTERONE CYPIONATE) 200 MG/ML injection Inject 1 mL into the muscle every 21 ( twenty-one) days.  . Wheat Dextrin (BENEFIBER DRINK MIX PO) Take 1 Dose by mouth 3 (three) times daily as needed (when not eating a high fiber diet).  No facility-administered encounter medications on file as of 12/19/2020.  :  Review of Systems:  Out of a complete 14 point review of systems, all are reviewed and negative with the exception of these  symptoms as listed below: Review of Systems  Neurological:       Here for f/u on cpap. Reports he is having trouble tolerating his machine. Reports the most troublesome part is the leaking around his mask. He also stats the pressure seems to be too high.      Objective:  Neurological Exam  Physical Exam Physical Examination:   Vitals:   12/19/20 1302  BP: 132/64  Pulse: (!) 56  SpO2: 98%   General Examination: The patient is a very pleasant 80 y.o. male in no acute distress. He appears well-developed and well-nourished and well groomed.   HEENT:Normocephalic, atraumatic, pupils are equal, round and reactive to light, extraocular tracking is good without limitation to gaze excursion or nystagmus noted. Hearing is grossly intact. Face is symmetric with normal facial animation. Speech is clear with no dysarthria noted. There is no hypophonia. There is no lip, neck/head, jaw or voice tremor. Neck is supple with full range of passive and active motion. There are no carotid bruits on auscultation. Oropharynx exam reveals: mild to moderatemouth dryness, adequatedental hygiene and mildairway crowding, tongue protrudes centrally in palate elevates symmetrically.  Chest:Clear to auscultation without wheezing, rhonchi or crackles noted.  Heart:S1+S2+0, regular and normal without murmurs, rubs or gallops noted.   Abdomen:Soft, non-tender and non-distended.   Extremities:There isnopitting edema in the distal lower extremities bilaterally.   Skin: Warm and dry without trophic changes noted.   Musculoskeletal: exam reveals no obvious joint deformities, tenderness or joint swelling or erythema.   Neurologically:  Mental status: The patient is awake, alert and oriented in all 4 spheres.Hisimmediate and remote memory, attention, language skills and fund of knowledge are fairly appropriate. Thought process is linear. Mood is normaland affect is normal.  Cranial nerves II - XII  are as described above under HEENT exam.  Motor exam: Normal bulk, strength and tone is noted. There is no tremor, Romberg is negative. Fine motor skills and coordination: grossly intact.  Cerebellar testing: No dysmetria or intention tremor. There is no truncal or gait ataxia.  Sensory exam: intact to light touch.  Gait, station and balance:No problems walking.    Assessmentand Plan:  In summary,Judea P Brannis a very pleasant 57 year oldmalewith an underlying medical history of macular degeneration, PACs, paroxysmal A. fib, heart murmur, Crohn's disease, cataract, aortic stenosis, vitamin D deficiency, weight loss, anemia, and hyperlipidemia, who presents for follow-up consultation of his obstructive sleep apnea.  He has noted forgetfulness.  His sleep testing in September 2021 indicated moderate sleep disordered breathing with a primary central component and a lesser obstructive component.  Interestingly, he did quite well from the respiratory event reduction on CPAP of 12 cm.  He did not sleep very well during either night, had periodic leg movements without significant sleep disruption.  He has been on CPAP of 17 cm initially and then 15 cm.  He was supposed to start on 12 cm.  I apologized for this mishap that the original order was misread.  He is willing to restart treatment on a pressure of 12 cm.  We talked to his DME company and we will make sure that the pressure change will take place.  He is advised to call us in about a month so we can review another data report  for 30 days.  We talked about the importance of compliance especially for symptom control but also to fulfill insurance criteria.  He is advised to follow-up routinely in this clinic in about 3 months but call us in a month so we can review a compliance download.  I answered all his questions today and he was in agreement with the plan.  I spent 30 minutes in total face-to-face time and in reviewing records during  pre-charting, more than 50% of which was spent in counseling and coordination of care, reviewing test results, reviewing medications and treatment regimen and/or in discussing or reviewing the diagnosis of CSA, OSA, the prognosis and treatment options. Pertinent laboratory and imaging test results that were available during this visit with the patient were reviewed by me and considered in my medical decision making (see chart for details).

## 2020-12-21 ENCOUNTER — Telehealth: Payer: Self-pay | Admitting: Neurology

## 2020-12-21 NOTE — Telephone Encounter (Signed)
FYI: Pt called, wanted you to know I use CPAP last night and had a successful night.

## 2020-12-24 NOTE — Telephone Encounter (Signed)
Thank you for the feedback!

## 2020-12-25 DIAGNOSIS — G4733 Obstructive sleep apnea (adult) (pediatric): Secondary | ICD-10-CM | POA: Diagnosis not present

## 2021-01-09 NOTE — Telephone Encounter (Signed)
I would be happy to talk to him about reevaluation or potential alternative treatment options.  Please offer a follow-up appointment to discuss options.

## 2021-01-09 NOTE — Telephone Encounter (Signed)
Pt called stating that due to him not using the cpap machine constantly, the insurance came and picked up the machine so there for he will not be using it and he has had to cx his f/u appt.

## 2021-01-09 NOTE — Telephone Encounter (Signed)
I called patient and set him up for soonest available appointment.  02/19/2021 at 930.  Patient declines being put on a wait list due to several other doctor appointments he has coming up.

## 2021-01-10 DIAGNOSIS — C44629 Squamous cell carcinoma of skin of left upper limb, including shoulder: Secondary | ICD-10-CM | POA: Diagnosis not present

## 2021-01-10 DIAGNOSIS — I10 Essential (primary) hypertension: Secondary | ICD-10-CM | POA: Diagnosis not present

## 2021-02-19 ENCOUNTER — Encounter: Payer: Self-pay | Admitting: Neurology

## 2021-02-19 ENCOUNTER — Telehealth: Payer: Self-pay

## 2021-02-19 ENCOUNTER — Ambulatory Visit: Payer: PPO | Admitting: Neurology

## 2021-02-19 VITALS — BP 130/64 | HR 68 | Ht 69.0 in | Wt 157.0 lb

## 2021-02-19 DIAGNOSIS — G4733 Obstructive sleep apnea (adult) (pediatric): Secondary | ICD-10-CM | POA: Diagnosis not present

## 2021-02-19 DIAGNOSIS — Z9989 Dependence on other enabling machines and devices: Secondary | ICD-10-CM | POA: Diagnosis not present

## 2021-02-19 DIAGNOSIS — G4731 Primary central sleep apnea: Secondary | ICD-10-CM | POA: Diagnosis not present

## 2021-02-19 NOTE — Patient Instructions (Signed)
It was nice to see you again today.  As discussed in my eyes I explained to, we will try to get clarification and resolution to your CPAP issue.  I would like to know what exactly your machine was picked up.  Since the pressure was not right in the beginning and this was through your DME company that the mistake was made, we will argue that your set up date was actually 12/19/2020 when the pressure was finally corrected.  You should not have lost coverage of the machine because you have 90 days to show compliance.  Even if you use the December set up date, you would have had till mid March to be compliant with treatment, again, your pressure was too high in the beginning and you were intolerant of the pressure which was not set correctly in the beginning. The DME company picked up the machine after 60 days of getting the machine.

## 2021-02-19 NOTE — Telephone Encounter (Signed)
Patient came to office today for follow-up appointment with Dr. Rexene Alberts on difficulty tolerating CPAP.  Patient reported when he started CPAP he had trouble tolerating the pressure and adjusting to the mask.  Per [facility] account, patient was started on his CPAP 10/24/2020.  Patient advised his DME company labeled him as noncompliant and on 12/27/2020 they picked up his machine. To note, DME did not set his pressure correctly and the initial start date and the pressure was higher than what Dr. Rexene Alberts ordered.   I called Ivin Booty at [facility], and we discussed this message. Per [facility] account patient was started on CPAP 10/24/2020, and CPAP was taken before the 90-day had expired, machine was picked up on 12/27/20. Ivin Booty and I discussed this message she stated [employer] had a start date of 10/01/2020( this was the authorization date), and patient deferred to start machine out to 10/24/2021.  I advised Ivin Booty that the start with [employer] should reflect when the patient received the machine and started to use in his home which would be 10/24/21.   Ivin Booty stated she would call the liaison department to see what can be done in regards to this issue.  Ivin Booty called me back and stated an appeal would be sent to [employer] asking for another 31-day period on the machine for patient to prove compliance.  To note on 2/9 patient started using the machine consistently and would have been compliant if he had kept machine until the 90-day mark of 01/21/2021.  I have faxed the office note from 02/19/2021 visit with Dr. Rexene Alberts to Ivin Booty. A second tier appeal will be supinated the turnaround time for decision is 10-14 business days.  I will touch base with Ivin Booty around the 14 business day mark.

## 2021-02-19 NOTE — Progress Notes (Signed)
Subjective:    [name] ID: Nicholas [name] is a 80 y.o. male.  HPI     Interim history:   Nicholas [name] is an 80 year old right-handed gentleman with an underlying medical history of macular degeneration, PACs, paroxysmal A. fib, heart murmur, Crohn's disease, cataract, aortic stenosis, vitamin D deficiency, weight loss, anemia, and hyperlipidemia, who Presents for Follow-up consultation of his obstructive sleep apnea after interim testing and starting CPAP therapy.  Nicholas [name] is unaccompanied today.  I last saw him on 12/19/20, at which time he was struggling with his CPAP, and was at 15 cm. He was advised that Nicholas original order of CPAP of 12 cm was misinterpreted by his DME company. They reported that Nicholas original order came through with a smudge and Nicholas 12 cm look like 17 cm.    Today, 02/19/2021: He reports that he was compliant with his CPAP once his pressure was reduced to 12 cm which was on our appointment on 12/19/2020.  Nevertheless, his DME company picked up his machine on 12/26/2020 stating that he was noncompliant with treatment.  He was actually compliant with his treatment and compliance data reflects this, he was compliant on his CPAP of 12 cm on 12/19/2020 through 12/25/2020.  Prior to that his pressure was 15 cm which was incorrect and Nicholas original set up date reflected a pressure of 17 cm which was also incorrect, his pressure should have been 12 cm on Nicholas long, start date was 10/24/2020.  He was able to tolerate Nicholas CPAP of 12 cm and indicates that he wanted to continue with treatment but he was notified by his DME company that they would have to pick up Nicholas machine.  He has other sleep related issues including interruption from incontinence and diarrhea.  He has not seen his GI specialist since Nicholas pandemic but had phone call visits.  He has a history of Crohn's and had surgery with very little of his colon left.  His residual AHI on Nicholas CPAP of 12 cm was 12.9, leak for Nicholas 95th percentile  19.8, residual events primarily central with central apnea index of 6/h.  Nicholas [name]'s allergies, current medications, family history, past medical history, past social history, past surgical history and problem list were reviewed and updated as appropriate.    Previously:   I saw him on 07/23/2020 for reevaluation of his sleep disturbance.  He was complaining of daytime tiredness.  He was agreeable to pursuing a sleep study.  He had a baseline sleep study, followed by a CPAP titration study.  His baseline sleep study from 08/06/2020 showed a sleep latency of 32 minutes, REM latency delayed at 202.5 minutes, sleep efficiency 72.2%.  Wake after sleep onset was elevated at 108.5 minutes with mild to moderate sleep fragmentation noted.  He had an increased percentage of stage II sleep, near absence of slow-wave sleep and REM sleep was 17.6%.  Total AHI was in Nicholas moderate range at 26.1/h, REM AHI 27/h, supine AHI 39/h.  Average oxygen saturation was 96%, nadir was 82%.  He was noted to have significant central apneas and these were more than 50% of his respiratory events.  He had mild periodic limb movements without significant arousals.  He was advised to return for a full night titration study.  He had this on 08/28/2020.  Sleep efficiency was 47.8%, sleep latency to persistent sleep delayed at 159.5 minutes and REM latency markedly delayed at 244.5 minutes.  He was fitted with a full facemask.  He was started on CPAP therapy at 5 cm and titrated to a final pressure of 12 cm.  His AHI was 0/h, O2 nadir 96% with nonsupine and non-REM sleep achieved only.  He was advised to start CPAP therapy at home.  He was again noted to have mild periodic leg movements with minimal arousals.  His set up date was 10/24/2020.    I reviewed his CPAP compliance data from Nicholas past 54 days, since set up, he used his machine 21 out of 54 days with percent use days greater than 4 hours at 15%, essentially no usage since early  January, around 11/16/2020, 1 night of usage on 12/15/2020.  He had called in Nicholas interim in December reporting that Nicholas pressure was difficult to tolerate and he was having trouble with Nicholas mask.  He was advised to meet up with his DME provider for mask issues.   I first met him at Nicholas request of his primary care physician, Dr. Philip [name], on 10/20/2019, at which time Nicholas [name] reported snoring and daytime tiredness.  His tiredness improved a little bit in Nicholas recent months.  He reported some weight loss.  He was trying to supplement his nutrition with Ensure.  He was struggling with diarrhea.  He was advised to proceed with sleep study testing but wanted to delay Nicholas test till after Nicholas first of Nicholas year.  He did not pursue sleep study testing earlier in Nicholas year.     10/20/19: (He) reports snoring and daytime tiredness.  His tiredness has improved a little bit in Nicholas past couple of months.  He has had some unintentional weight loss and has started using Ensure, half a bottle twice daily.  He still struggles with diarrhea.  He goes to bed generally around 9-ish and rise time is around 7.  He does not have night to night nocturia and denies any recurrent morning headaches.  His middle daughter has sleep apnea but does not use her CPAP machine.  He has woken up with a snorting sound and his wife has noted occasional snorting sounds when he is asleep.  He does have some restless sleep at times, he does not take any sleep aid.  He had a tonsillectomy at age 35.  He has a prescription for Synthroid pending which he has not yet started. I reviewed your telemedicine note from 10/03/2019, which you kindly included.  His Epworth sleepiness score is 10 out of 24, fatigue severity score is 42 out of 63.  He is a retired Furniture conservator/restorer.  He lives with his wife, no pets in Nicholas house.  He lost his oldest daughter last year and, tragically, his only granddaughter from her took her own life Nicholas year before at age 20.  His Past  Medical History Is Significant For: Past Medical History:  Diagnosis Date  . Anemia    PO IRON   . Aortic stenosis, mild   . Cataract   . Colonic hemorrhage 1964  . Crohn's disease ([place])   . GERD (gastroesophageal reflux disease)   . Heart murmur   . Hyperlipidemia   . Macular degeneration   . PAC (premature atrial contraction)   . PAF (paroxysmal atrial fibrillation) ([place])    SEES DR. BRACKBILL  . Palpitations     His Past Surgical History Is Significant For: Past Surgical History:  Procedure Laterality Date  . APPENDECTOMY    . Biopsy Facial  05/10/2019  . BLADDER REPAIR  1964   intestine leaks/bladder  repair  . CARDIOVASCULAR STRESS TEST  11/29/2003   EF 64%  . CARDIOVASCULAR STRESS TEST  11/17/2012   No evidence of ischemia; EF 66%; inferior wall attenuation favored to rep diaphragmatic attenuation  . CATARACT EXTRACTION W/ INTRAOCULAR LENS  IMPLANT, BILATERAL     2013  . ILEOSTOMY  04/16/2012   Procedure: ILEOSTOMY;  Surgeon: Adin Hector, MD;  Location: [facility];  Service: General;  Laterality: N/A;  . ILEOSTOMY CLOSURE  12/07/2012  . ILEOSTOMY CLOSURE  12/07/2012   Procedure: ILEOSTOMY TAKEDOWN;  Surgeon: Adin Hector, MD;  Location: [facility];  Service: General;  Laterality: N/A;  . LAPAROTOMY  04/16/2012   Procedure: EXPLORATORY LAPAROTOMY;  Surgeon: Adin Hector, MD;  Location: [facility];  Service: General;  Laterality: N/A;  . LYSIS OF ADHESION  12/07/2012   Procedure: LYSIS OF ADHESION;  Surgeon: Adin Hector, MD;  Location: [facility];  Service: General;  Laterality: N/A;  . PROCTOSCOPY  12/07/2012   Procedure: PROCTOSCOPY;  Surgeon: Adin Hector, MD;  Location: [facility];  Service: General;;  ILEO PROCTOSCOPY  . STOMACH SURGERY  1959   ulcer  . TONSILLECTOMY    . US ECHOCARDIOGRAPHY  05/20/2010   EF 55-60%  . US ECHOCARDIOGRAPHY  10/23/2005   EF 55-60%    His Family History Is Significant For: Family History  Problem Relation Age of Onset  . Heart disease  Maternal Grandfather   . Heart disease Other        maternal side  . Congestive Heart Failure Daughter   . Colon cancer Neg Hx     His Social History Is Significant For: Social History   Socioeconomic History  . Marital status: Married    Spouse name: Not on file  . Number of children: 3  . Years of education: Not on file  . Highest education level: Not on file  Occupational History  . Occupation: Retired    Fish farm manager: [employer]  Tobacco Use  . Smoking status: Former Smoker    Packs/day: 1.00    Years: 2.00    Pack years: 2.00    Quit date: 11/10/1964    Years since quitting: 56.3  . Smokeless tobacco: Never Used  Vaping Use  . Vaping Use: Never used  Substance and Sexual Activity  . Alcohol use: No  . Drug use: No  . Sexual activity: Not on file  Other Topics Concern  . Not on file  Social History Narrative   caffeinated drinks less than one a day. Eldest daughter passed from heart deficiency.    Social Determinants of Health   Financial Resource Strain: Not on file  Food Insecurity: Not on file  Transportation Needs: Not on file  Physical Activity: Not on file  Stress: Not on file  Social Connections: Not on file    His Allergies Are:  Not on File:   His Current Medications Are:  Outpatient Encounter Medications as of 02/19/2021  Medication Sig  . amLODipine (NORVASC) 2.5 MG tablet Take 2.5 mg by mouth daily.  [name] aspirin EC 81 MG tablet Take 81 mg by mouth. M, W, F only  . azaTHIOprine (IMURAN) 50 MG tablet Take 50 mg by mouth daily.  . ergocalciferol (VITAMIN D2) 50000 UNITS capsule Take 50,000 Units by mouth once a week.   . fenofibrate 160 MG tablet Take 160 mg by mouth daily.  . IRON PO Take 2 tablets by mouth daily. Iron 47m  . levothyroxine (SYNTHROID) 25  MCG tablet Take 25 mcg by mouth daily before breakfast.  . loperamide (IMODIUM) 2 MG capsule Take 2 mg by mouth daily as needed for diarrhea or loose stools.  [name] losartan (COZAAR) 25 MG tablet  TAKE 2 TABLETS (50 MG) BY MOUTH EVERY EVENING.  . metoprolol succinate (TOPROL-XL) 25 MG 24 hr tablet Take 0.5 tablets (12.5 mg total) by mouth daily. Take with or immediately following a meal.  . Multiple Vitamin (MULTI VITAMIN DAILY PO) Take 1 tablet by mouth daily.  . Multiple Vitamins-Minerals (PRESERVISION AREDS 2) CAPS Take by mouth. 1 in Nicholas am 1 in Nicholas pm  . testosterone cypionate (DEPOTESTOSTERONE CYPIONATE) 200 MG/ML injection Inject 1 mL into Nicholas muscle every 21 ( twenty-one) days.  . Wheat Dextrin (BENEFIBER DRINK MIX PO) Take 1 Dose by mouth 3 (three) times daily as needed (when not eating a high fiber diet).    No facility-administered encounter medications on file as of 02/19/2021.  :  Review of Systems:  Out of a complete 14 point review of systems, all are reviewed and negative with Nicholas exception of these symptoms as listed below: Review of Systems  Neurological:       Here for f/u. Pt reports he had trouble tolerating Nicholas CPAP at first and due to non compliance choice picked machine up.     Objective:  Neurological Exam  Physical Exam Physical Examination:   Vitals:   02/19/21 0924  BP: 130/64  Pulse: 68  SpO2: 98%    General Examination: Nicholas [name] is a very pleasant 80 y.o. male in no acute distress. He appears well-developed and well-nourished and well groomed.   HEENT:Normocephalic, atraumatic, pupils are equal, round and reactive to light, extraocular tracking is good without limitation to gaze excursion or nystagmus noted. Hearing is grossly intact. Face is symmetric with normal facial animation.Speech is clear with no dysarthria noted. There is no hypophonia. There is no lip, neck/head, jaw or voice tremor. Neck is supple with full range of passive and active motion. There are no carotid bruits on auscultation. Oropharynx exam reveals: moderatemouth dryness, adequatedental hygiene and mildairway crowding. Tongue protrudes centrally in palate elevates  symmetrically.  Chest:Clear to auscultation without wheezing, rhonchi or crackles noted.  Heart:S1+S2+0, regular and normal without murmurs, rubs or gallops noted.   Abdomen:Soft, non-tender and non-distended.   Extremities:There isnopitting edema in Nicholas distal lower extremities bilaterally.   Skin: Warm and dry without trophic changes noted.   Musculoskeletal: exam reveals no obvious joint deformities.   Neurologically:  Mental status: Nicholas [name] is awake, alert and oriented in all 4 spheres.Hisimmediate and remote memory, attention, language skills and fund of knowledge arefairlyappropriate. Thought process is linear. Mood is normaland affect is normal.  Cranial nerves II - XII are as described above under HEENT exam.  Motor exam: Normal bulk, strength and tone is noted. There is no tremor, Romberg is negative. Fine motor skills and coordination: grossly intact.  Cerebellar testing: No dysmetria or intention tremor. There is no truncal or gait ataxia.  Sensory exam: intact to light touch.  Gait, station and balance:No problems walking.    Assessmentand Plan:  In summary,Jonnie P Brannis a very pleasant 2 year oldmalewith an underlying medical history of macular degeneration, PACs, paroxysmal A. fib, heart murmur, Crohn's disease, cataract, aortic stenosis, vitamin D deficiency, weight loss, anemia, and hyperlipidemia, whopresents for follow-up consultation of his obstructive sleep apnea. His sleep testing in September 2021 indicated moderate sleep disordered breathing with a primary central component and  a lesser obstructive component.  Interestingly, he did quite well with CPAP of 12 cm.  He did not sleep very well during either night, had periodic leg movements.  He does not have telltale symptoms of restless leg syndrome.  Unfortunately, he was originally started on CPAP of 17 cm due to a misinterpretation from his DME company, set up date was 10/24/2020 but  when we talked to his DME representative today they informed us that Nicholas set up date was incorrectly used as 10/01/2020 when he was originally authorized for treatment by his insurance carrier.  He actually received Nicholas machine in December.  His DME company representative will file a correction of Nicholas set up date.  In addition, to no fault of Nicholas [name]'s, he was placed on a CPAP of 17 and then 15 cm, much higher than Nicholas original CPAP pressure of 12 cm.  It was not until 12/19/2020 that his pressure was corrected and he was compliant with treatment after that but lost Nicholas machine on 12/26/2020 when it was picked up by his DME representative.  He would be willing to try CPAP again.  Unfortunately, he does have sleep related difficulty including issues with incontinence and ongoing GI related problems including diarrhea, has a history of Crohn's.  He is advised that we will try to get him his machine back and he hopefully does not have to go through testing all over again as starting Nicholas road pressure was obviously not his fault and he was not advised that his 90-day compliance will start after Nicholas authorization was received in November as opposed to when he actually received Nicholas machine in December.  We will try to sort this out.  He is advised that we will call him to make a follow-up appointment.  We are waiting on an update from his DME company at this time.  He is agreeable to restarting CPAP therapy.  I answered all his questions today and he was in agreement with Nicholas plan.  I spent 35 minutes in total face-to-face time and in reviewing records during pre-charting, more than 50% of which was spent in counseling and coordination of care, reviewing test results, reviewing medications and treatment regimen and/or in discussing or reviewing Nicholas diagnosis of CSA, OSA, Nicholas prognosis and treatment options. Pertinent laboratory and imaging test results that were available during this visit with Nicholas [name] were  reviewed by me and considered in my medical decision making (see chart for details).

## 2021-02-25 DIAGNOSIS — H26491 Other secondary cataract, right eye: Secondary | ICD-10-CM | POA: Diagnosis not present

## 2021-02-25 DIAGNOSIS — K50813 Crohn's disease of both small and large intestine with fistula: Secondary | ICD-10-CM | POA: Diagnosis not present

## 2021-02-27 NOTE — Progress Notes (Signed)
Primary Physician:  Leanna Battles, MD   Patient ID: Nicholas [name], male    DOB: 1940/11/22, 80 y.o.   MRN: [id number]  Chief Complaint  Patient presents with  . Hypertension  . Bradycardia  . Atrial Fibrillation  . Follow-up    3 month    HPI   Nicholas [name]  is a 80 y.o. male  with chronic dyspnea on exertion, dyslipidemia, diet-controlled diabetes mellitus, Crohn's disease on Aziothioprine,  admitted to [facility] in January 2014 when he had reversal of ileostomy postoperatively on 11/12/2012 had wide complex tachycardia and also narrow complex tachycardia suggestive of PSVT and also A. Flutter with abberancy. He has diagnosis of paroxysmal atrial fibrillation by history in January 2005, he is only on ASA therapy per patient choice.   Patient presents for 45-monthfollow-up of hypertension and bradycardia.  At last visit reduced metoprolol succinate from 25 mg to 12.5 mg daily.  Patient states he has continued to have symptoms of fatigue and decreased exercise tolerance.  Denies chest pain, palpitations, dizziness, syncope, leg swelling, orthopnea.  He states symptoms have mildly improved, reporting "good days and bad days".   Patient brings with him a written revealing soft blood pressures as low as 95 mmHg systolic with maximum systolic in the last 2 months 138 mmHg.  His log also demonstrates heart rate has improved some ranging 55- 65 bpm over the last 2 months.  Past Medical History:  Diagnosis Date  . Anemia    PO IRON   . Aortic stenosis, mild   . Cataract   . Colonic hemorrhage 1964  . Cro[place]'s disease [place]   . GERD (gastroesophageal reflux disease)   . Heart murmur   . Hyperlipidemia   . Macular degeneration   . PAC (premature atrial contraction)   . PAF (paroxysmal atrial fibrillation) ([place]    SEES DR. BRACKBILL  . Palpitations    Past Surgical History:  Procedure Laterality Date  . APPENDECTOMY    . Biopsy Facial  05/10/2019  . BLADDER REPAIR  1964    intestine leaks/bladder repair  . CARDIOVASCULAR STRESS TEST  11/29/2003   EF 64%  . CARDIOVASCULAR STRESS TEST  11/17/2012   No evidence of ischemia; EF 66%; inferior wall attenuation favored to rep diaphragmatic attenuation  . CATARACT EXTRACTION W/ INTRAOCULAR LENS  IMPLANT, BILATERAL     2013  . ILEOSTOMY  04/16/2012   Procedure: ILEOSTOMY;  Surgeon: HAdin Hector MD;  [place]  Service: General;  Laterality: N/A;  . ILEOSTOMY CLOSURE  12/07/2012  . ILEOSTOMY CLOSURE  12/07/2012   Procedure: ILEOSTOMY TAKEDOWN;  Surgeon: HAdin Hector MD;  [place]  Service: General;  Laterality: N/A;  . LAPAROTOMY  04/16/2012   Procedure: EXPLORATORY LAPAROTOMY;  Surgeon: HAdin Hector MD;  [place]  Service: General;  Laterality: N/A;  . LYSIS OF ADHESION  12/07/2012   Procedure: LYSIS OF ADHESION;  Surgeon: HAdin Hector MD;  [place]  Service: General;  Laterality: N/A;  . PROCTOSCOPY  12/07/2012   Procedure: PROCTOSCOPY;  Surgeon: HAdin Hector MD;  [place]  Service: General;;  ILEO PROCTOSCOPY  . STOMACH SURGERY  1959   ulcer  . TONSILLECTOMY    . [place]ARD[place]  05/20/2010   EF 55-60%  . [place]ARD[place]AP[place]  10/23/2005   EF 55-60%   Family History  Problem Relation Age of Onset  . Heart disease Maternal Grandfather   .  Heart disease Other        maternal side  . Congestive Heart Failure Daughter   . Colon cancer Neg Hx    Social History   Tobacco Use  . Smoking status: Former Smoker    Packs/day: 1.00    Years: 2.00    Pack years: 2.00    Quit date: 11/10/1964    Years since quitting: 56.3  . Smokeless tobacco: Never Used  Substance Use Topics  . Alcohol use: No    Marital Status: Married  ROS:   Review of Systems  Cardiovascular: Positive for dyspnea on exertion. Negative for chest pain, claudication, leg swelling, orthopnea, palpitations, paroxysmal nocturnal dyspnea and syncope.  Hematologic/Lymphatic: Does not  bruise/bleed easily.  Gastrointestinal: Negative for melena.   Objective:  Blood pressure 116/60, pulse 62, temperature 98 F (36.7 C), resp. rate 16, height _0  (1.753 m), weight 157 lb (71.2 kg), SpO2 98 %. Body mass index is 23.18 kg/m.  Vitals with BMI 02/28/2021 02/28/2021 02/19/2021  Height - _1  _2   Weight - 157 lbs 157 lbs  BMI - 01.77 93.90  Systolic 300 923 300  Diastolic 60 68 64  Pulse 62 61 68    Physical Exam Vitals reviewed.  Constitutional:      Appearance: He is well-developed.  Cardiovascular:     Rate and Rhythm: Regular rhythm. Bradycardia present.     Pulses: Intact distal pulses.          Carotid pulses are 2+ on the right side and 2+ on the left side.      Radial pulses are 2+ on the right side and 2+ on the left side.       Dorsalis pedis pulses are 2+ on the right side and 2+ on the left side.       Posterior tibial pulses are 1+ on the right side and 1+ on the left side.     Heart sounds: Murmur heard.   Mid to late systolic murmur is present with a grade of 2/6 at the lower left sternal border.     Comments: No leg edema.  Pulmonary:     Effort: Pulmonary effort is normal. No accessory muscle usage or respiratory distress.     Breath sounds: Normal breath sounds. No wheezing, rhonchi or rales.    Laboratory examination:   Recent Labs    04/26/20 1506  NA 139  K 4.5  CL 104  CO2 24  GLUCOSE 106*  BUN 13  CREATININE 1.27  CALCIUM 9.5  GFRNONAA 53*  GFRAA 61    CrCl cannot be calculated (Patient's most recent lab result is older than the maximum 21 days allowed.).  CMP Latest Ref Rng & Units 04/26/2020 02/13/2020 07/14/2016  Glucose 65 - 99 mg/dL 106(H) 100(H) 109(H)  BUN 8 - 27 mg/dL _3 Creatinine 0.76 - 1.27 mg/dL 1.27 1.32(H) 1.32(H)  Sodium 134 - 144 mmol/L 139 141 135  Potassium 3.5 - 5.2 mmol/L 4.5 4.5 4.0  Chloride 96 - 106 mmol/L 104 108 107  CO2 20 - 29 mmol/L 24 19(L) 24  Calcium 8.6 - 10.2 mg/dL 9.5 10.1 8.7(L)   Total Protein 6.0 - 8.3 g/dL - - -  Total Bilirubin 0.3 - 1.2 mg/dL - - -  Alkaline Phos 39 - 117 U/L - - -  AST 0 - 37 U/L - - -  ALT 0 - 53 U/L - - -   CBC Latest Ref Rng &  Units 02/13/2020 07/14/2016 02/24/2015  WBC 4.0 - 10.5 K/uL 7.3 4.9 8.1  Hemoglobin 13.0 - 17.0 g/dL 14.9 13.9 12.3(L)  Hematocrit 39.0 - 52.0 % 45.0 42.5 36.8(L)  Platelets 150 - 400 K/uL 282 205 158   BNP (last 3 results) No results for input(s): BNP in the last 8760 hours. No results found for: HGBA1C  Lab Results  Component Value Date   TSH 4.641 (H) 12/10/2012    External labs: 01/05/2020:  HbA1C 5.5% Albumin 4.2, Total Bilirubin 0.4, Bilirubin, Direct 0.1, Alkaline Phosphatase 57, AST 22, ALT 13, Total Protein 6.3.   12/28/2019:  BUN 20, Creatinine 1.21, Na/K 137/4.7, Glucose 167, eGFR 56,  Hemoglobin 13.4, MCV 97.9, RBC 4.04, WBC 7.7.   Cholesterol, total 139.000 m 09/26/2019 Triglycerides 111.000 09/26/2019 HDL 50 MG/DL 09/26/2019 LDL 67.000 mg 09/26/2019  A1C 5.300 % 09/26/2019; TSH 5.080 09/26/2019   Medications and allergy:    Not on File   Current Outpatient Medications on File Prior to Visit  Medication Sig Dispense Refill  . amLODipine (NORVASC) 2.5 MG tablet Take 2.5 mg by mouth daily.    [name] aspirin EC 81 MG tablet Take 81 mg by mouth. M, W, F only    . azaTHIOprine (IMURAN) 50 MG tablet Take 50 mg by mouth daily.    . ergocalciferol (VITAMIN D2) 50000 UNITS capsule Take 50,000 Units by mouth once a week.     . fenofibrate 160 MG tablet Take 160 mg by mouth daily.    . IRON PO Take 2 tablets by mouth daily. Iron 33m    . levothyroxine (SYNTHROID) 25 MCG tablet Take 25 mcg by mouth daily before breakfast.    . loperamide (IMODIUM) 2 MG capsule Take 2 mg by mouth daily as needed for diarrhea or loose stools.    .Marland Kitchenlosartan (COZAAR) 25 MG tablet TAKE 2 TABLETS (50 MG) BY MOUTH EVERY EVENING. 60 tablet 2  . Multiple Vitamin (MULTI VITAMIN DAILY PO) Take 1 tablet by mouth daily.    .  Multiple Vitamins-Minerals (PRESERVISION AREDS 2) CAPS Take by mouth. 1 in the am 1 in the pm    . testosterone cypionate (DEPOTESTOSTERONE CYPIONATE) 200 MG/ML injection Inject 1 mL into the muscle every 21 ( twenty-one) days.  5  . Wheat Dextrin (BENEFIBER DRINK MIX PO) Take 1 Dose by mouth 3 (three) times daily as needed (when not eating a high fiber diet).      No current facility-administered medications on file prior to visit.     Radiology:   Chest X-Ray 02/13/2020: No active cardiopulmonary disease.  MRI abdomen with and without contrast 03/07/2020: 1. Biliary and pancreatic ductal dilatation with abrupt cutoff of the common bile duct proximal to the ampulla as described above. Subtle periampullary enhancement. Recommend correlation with ERCP, as underlying lesion could have this appearance. 2. 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 3. Mild hepatic steatosis.  CT abdomen 01/02/2020: 1. Status post subtotal colectomy with ileorectal anastomosis. No CT evidence of active disease. 2. Mild biliary ductal fullness with smooth tapering toward the level of the ampulla, nonspecific. Correlate with biliary labs and ERCP if elevated.  3. Sequelae of chronic pancreatitis with the pancreatic duct ectasia and punctate parenchymal calcifications. Subcentimeter cystic focus within the pancreatic head may reflect a side branch intraductal papillary mucinous neoplasm versus sequelae of pseudocyst. Consider 6-12 month follow-up with pancreas protocol MR.  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  Also had narrow complex tachy for 15 minutes that again is suspicious for A. Fl with 2:1 conduction vs atrial tachycardia vs PSVT.  Event Monitor 09/16/2017: Sinus rhythm, heart rate 37-126 bpm. symptoms of fatigue with sinus rhythm at 80 bpm. No atrial fibrillation, atrial flutter  Echocardiogram 02/28/2020:   Normal LV systolic function with visual EF 50-55%. Left ventricle cavity is normal in size. Moderate left ventricular hypertrophy. Normal global wall motion. Normal diastolic filling pattern, normal LAP.  Structurally normal mitral valve. No evidence of mitral stenosis. Mild (Grade I) mitral regurgitation.  Structurally normal tricuspid valve. No evidence of tricuspid stenosis.  Mild tricuspid regurgitation.  Compared to previous study dated 09/28/2017 Mild MR and TR are new. LVEF remains stable.  Exercise tetrofosmin stress test  02/29/2020: Normal ECG stress. The baseline blood pressure was 172/90 mmHg and increased to 208/80 mmHg, which is a hypertensive response to exercise. The patient exercised for 4 minutes and 45 seconds of a Bruce protocol, achieving approximately 7 METs.  Myocardial perfusion is normal. Overall LV systolic function is normal without regional wall motion abnormalities. Stress LV EF: 71%.  Compared to 09/21/2017, no significant change.  EKG   EKG 02/28/2021: Sinus bradycardia at a rate of 56 bpm.  Normal axis.  Early repolarization abnormality.  No evidence of ischemia or underlying injury pattern.  Compared to EKG 11/29/2020, bradycardia  has improved.  EKG 11/29/2020: Marked sinus bradycardia at rate of 46 bpm, otherwise normal EKG.  Compared to 02/17/2020, heart rate was 65 bpm and on 11/30/2019, heart rate was 56 bpm.     Assessment:     ICD-10-CM   1. Primary hypertension  I10 EKG 12-Lead  2. Bradycardia by electrocardiogram  R00.1   3. Dyspnea on exertion  R06.00     No orders of the defined types were placed in this encounter.  Medications Discontinued During This Encounter  Medication Reason  . metoprolol succinate (TOPROL-XL) 25 MG 24 hr tablet Side effect (s)    Recommendations:   Nicholas [name]  is a 80 y.o. 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 Flutter with abberancy. He has diagnosis of paroxysmal atrial fibrillation by history in January 2005, he is only on ASA therapy per patient choice and has not had any recurrence of palpitations.   Patient presents for 84-monthfollow-up of hypertension and bradycardia.  At last visit reduced metoprolol succinate from 25 mg to 12.5 mg daily.  Patient's home log reveals blood pressure is soft and heart rate remains borderline bradycardic, however has improved since reducing metoprolol.  His blood pressure was initially elevated in the office, however it is soft upon recheck.  In view of continued fatigue and decreased exercise tolerance will discontinue metoprolol succinate.  Will not make other changes to his medications at this time.  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  Follow-up in 3 months, sooner if needed, for blood pressure management, fatigue.   CAlethia Berthold PA-C 02/28/2021, 4:12 PM Office: [phone]

## 2021-02-28 ENCOUNTER — Ambulatory Visit: Payer: PPO | Admitting: Student

## 2021-02-28 ENCOUNTER — Ambulatory Visit: Payer: PPO | Admitting: Cardiology

## 2021-02-28 ENCOUNTER — Encounter: Payer: Self-pay | Admitting: Student

## 2021-02-28 ENCOUNTER — Other Ambulatory Visit: Payer: Self-pay

## 2021-02-28 VITALS — BP 116/60 | HR 62 | Temp 98.0°F | Resp 16 | Ht 69.0 in | Wt 157.0 lb

## 2021-02-28 DIAGNOSIS — R001 Bradycardia, unspecified: Secondary | ICD-10-CM

## 2021-02-28 DIAGNOSIS — R0609 Other forms of dyspnea: Secondary | ICD-10-CM | POA: Diagnosis not present

## 2021-02-28 DIAGNOSIS — I10 Essential (primary) hypertension: Secondary | ICD-10-CM

## 2021-02-28 DIAGNOSIS — R0600 Dyspnea, unspecified: Secondary | ICD-10-CM

## 2021-03-18 ENCOUNTER — Telehealth: Payer: Self-pay | Admitting: Neurology

## 2021-03-18 NOTE — Telephone Encounter (Signed)
[facility] Ivin Booty) called, we did authorization for 3 months for patient to get CPAP machine back. Would like a call from the nurse.

## 2021-03-18 NOTE — Telephone Encounter (Signed)
Noted, thank you

## 2021-03-18 NOTE — Telephone Encounter (Signed)
I called Ivin Booty back, She sts [employer] has approved an additional 3 month period for pts autopap. Pt will receive machine on 03/20/21. Pt has been schedule for 05/16/21 @ 830 am for his next appt.

## 2021-03-20 DIAGNOSIS — G4733 Obstructive sleep apnea (adult) (pediatric): Secondary | ICD-10-CM | POA: Diagnosis not present

## 2021-03-25 DIAGNOSIS — L814 Other melanin hyperpigmentation: Secondary | ICD-10-CM | POA: Diagnosis not present

## 2021-03-25 DIAGNOSIS — L570 Actinic keratosis: Secondary | ICD-10-CM | POA: Diagnosis not present

## 2021-03-25 DIAGNOSIS — Z85828 Personal history of other malignant neoplasm of skin: Secondary | ICD-10-CM | POA: Diagnosis not present

## 2021-03-25 DIAGNOSIS — D229 Melanocytic nevi, unspecified: Secondary | ICD-10-CM | POA: Diagnosis not present

## 2021-03-25 DIAGNOSIS — L905 Scar conditions and fibrosis of skin: Secondary | ICD-10-CM | POA: Diagnosis not present

## 2021-03-25 DIAGNOSIS — D485 Neoplasm of uncertain behavior of skin: Secondary | ICD-10-CM | POA: Diagnosis not present

## 2021-03-25 DIAGNOSIS — L819 Disorder of pigmentation, unspecified: Secondary | ICD-10-CM | POA: Diagnosis not present

## 2021-03-25 DIAGNOSIS — C44329 Squamous cell carcinoma of skin of other parts of face: Secondary | ICD-10-CM | POA: Diagnosis not present

## 2021-03-25 DIAGNOSIS — L821 Other seborrheic keratosis: Secondary | ICD-10-CM | POA: Diagnosis not present

## 2021-03-26 ENCOUNTER — Ambulatory Visit: Payer: PPO | Admitting: Adult Health

## 2021-03-27 ENCOUNTER — Ambulatory Visit: Payer: PPO | Attending: Internal Medicine

## 2021-03-27 DIAGNOSIS — Z23 Encounter for immunization: Secondary | ICD-10-CM

## 2021-03-27 NOTE — Progress Notes (Signed)
   Covid-19 Vaccination Clinic  Name:  VIRGAL WARMUTH    MRN: [id number] DOB: 02-26-1941  03/27/2021  Mr. Bommarito was observed post Covid-19 immunization for 15 minutes without incident. He was provided with Vaccine Information Sheet and instruction to access the V-Safe system.   Mr. Arrighi was instructed to call 911 with any severe reactions post vaccine: [name] Difficulty breathing  . Swelling of face and throat  . A fast heartbeat  . A bad rash all over body  . Dizziness and weakness   Immunizations Administered    Name Date Dose VIS Date Route   Moderna Covid-19 Booster Vaccine 03/27/2021 10:54 AM 0.25 mL 08/29/2020 Intramuscular   Manufacturer: Moderna   Lot: [id number]   [place]: [phone]

## 2021-04-02 ENCOUNTER — Other Ambulatory Visit (HOSPITAL_COMMUNITY): Payer: Self-pay

## 2021-04-02 MED ORDER — COVID-19 MRNA VACC (MODERNA) 100 MCG/0.5ML IM SUSP
INTRAMUSCULAR | 0 refills | Status: DC
  Filled 2021-04-02: qty 0.5, 17d supply, fill #0

## 2021-04-15 ENCOUNTER — Telehealth: Payer: Self-pay | Admitting: Neurology

## 2021-04-15 ENCOUNTER — Other Ambulatory Visit: Payer: Self-pay | Admitting: Cardiology

## 2021-04-15 DIAGNOSIS — I10 Essential (primary) hypertension: Secondary | ICD-10-CM

## 2021-04-15 NOTE — Telephone Encounter (Signed)
Pt states his CPAP leaks, he is asking for a call to discuss.

## 2021-04-16 NOTE — Telephone Encounter (Signed)
I called pt. He reports since starting his CPAP on 03/22/21 several leaks have been noted around his mask and registered on his machine.  I advised pt I would contact [facility] for them to reach out about a mask re fit.  Pt sts he is scheduled for a mask re fit on 6/11.  Pt will call back and let us know how he is doing once he is refitted. I have still reached out to [facility] about this and faxed a copy of the compliance data from [employer]. Pt sts [facility] told him they could not see the data.

## 2021-04-19 DIAGNOSIS — L570 Actinic keratosis: Secondary | ICD-10-CM | POA: Diagnosis not present

## 2021-04-19 DIAGNOSIS — C44329 Squamous cell carcinoma of skin of other parts of face: Secondary | ICD-10-CM | POA: Diagnosis not present

## 2021-04-20 DIAGNOSIS — G4733 Obstructive sleep apnea (adult) (pediatric): Secondary | ICD-10-CM | POA: Diagnosis not present

## 2021-04-23 DIAGNOSIS — E039 Hypothyroidism, unspecified: Secondary | ICD-10-CM | POA: Diagnosis not present

## 2021-04-23 DIAGNOSIS — F0390 Unspecified dementia without behavioral disturbance: Secondary | ICD-10-CM | POA: Diagnosis not present

## 2021-04-23 DIAGNOSIS — I10 Essential (primary) hypertension: Secondary | ICD-10-CM | POA: Diagnosis not present

## 2021-04-23 DIAGNOSIS — G4733 Obstructive sleep apnea (adult) (pediatric): Secondary | ICD-10-CM | POA: Diagnosis not present

## 2021-04-24 DIAGNOSIS — F0390 Unspecified dementia without behavioral disturbance: Secondary | ICD-10-CM | POA: Diagnosis not present

## 2021-05-02 DIAGNOSIS — G4733 Obstructive sleep apnea (adult) (pediatric): Secondary | ICD-10-CM | POA: Diagnosis not present

## 2021-05-07 DIAGNOSIS — D490 Neoplasm of unspecified behavior of digestive system: Secondary | ICD-10-CM | POA: Diagnosis not present

## 2021-05-10 ENCOUNTER — Other Ambulatory Visit: Payer: Self-pay | Admitting: Student

## 2021-05-10 DIAGNOSIS — I10 Essential (primary) hypertension: Secondary | ICD-10-CM

## 2021-05-16 ENCOUNTER — Other Ambulatory Visit: Payer: Self-pay

## 2021-05-16 ENCOUNTER — Ambulatory Visit: Payer: PPO | Admitting: Neurology

## 2021-05-16 ENCOUNTER — Encounter: Payer: Self-pay | Admitting: Neurology

## 2021-05-16 VITALS — BP 127/69 | HR 69 | Ht 69.0 in | Wt 155.0 lb

## 2021-05-16 DIAGNOSIS — G4731 Primary central sleep apnea: Secondary | ICD-10-CM | POA: Diagnosis not present

## 2021-05-16 DIAGNOSIS — G4733 Obstructive sleep apnea (adult) (pediatric): Secondary | ICD-10-CM | POA: Diagnosis not present

## 2021-05-16 DIAGNOSIS — Z9989 Dependence on other enabling machines and devices: Secondary | ICD-10-CM | POA: Diagnosis not present

## 2021-05-16 NOTE — Patient Instructions (Signed)
It was nice to see you again today.  You have done a great job using your CPAP.  Your sleep apnea is improved but you have developed some central apneas I believe.  Sometimes this is a sign of still adjusting to the treatment.  I would like to reduce your pressure from 12 cm to 11 cm at this time and review your download in about 6 weeks.  For this, please call us or email [place] through [facility] to remind [place] in about 6 weeks after the pressure change so we can review a 30-day compliance report again remotely.    Please follow-up with your GI physician as scheduled and talk to your primary care about your symptoms of feeling cold and changes in your energy level over the past year.  You had some blood work through Dr. Philip Aspen.  I would recommend that you make sure you have checked your testosterone level, vitamin D, vitamin B12, thyroid function, and checked for anemia.  Please continue using your CPAP regularly. While your insurance requires that you use CPAP at least 4 hours each night on 70% of the nights, I recommend, that you not skip any nights and use it throughout the night if you can. Getting used to CPAP and staying with the treatment long term does take time and patience and discipline. Untreated obstructive sleep apnea when it is moderate to severe can have an adverse impact on cardiovascular health and raise her risk for heart disease, arrhythmias, hypertension, congestive heart failure, stroke and diabetes. Untreated obstructive sleep apnea causes sleep disruption, nonrestorative sleep, and sleep deprivation. This can have an impact on your day to day functioning and cause daytime sleepiness and impairment of cognitive function, memory loss, mood disturbance, and problems focussing. Using CPAP regularly can improve these symptoms.  Please follow-up in about 3 to 4 months to see one of our nurse practitioners for sleep apnea checkup.  If you have any problems in the interim, please do not hesitate  to call or email [place].

## 2021-05-16 NOTE — Progress Notes (Signed)
Subjective:    [name] ID: Nicholas [name] is a 80 y.o. male.  HPI    Interim history:   Nicholas [name] is an 80 year old right-handed gentleman with an underlying medical history of macular degeneration, PACs, paroxysmal A. fib, heart murmur, Crohn's disease, cataract, aortic stenosis, vitamin D deficiency, weight loss, anemia, and hyperlipidemia, who presents for follow-up consultation of his obstructive sleep apnea after restarting CPAP therapy.  Nicholas [name] is unaccompanied today.  I last saw him on 02/19/2021, at which time Nicholas [name] reported that his machine had been picked up by his DME company in February.  However, since his pressure was not set to 12 cm and was too high in Nicholas beginning due to a miscommunication, Nicholas [name] never really had a chance to be compliant with his CPAP of 12 cm.  We were able to get an extension of his CPAP usage.  Nicholas [name] was restarted on CPAP in May 2022, on 03/22/21.  Today, 05/16/2021: I reviewed his CPAP compliance data from 04/15/2021 through 05/14/2021, which is a total of 30 days, during which time Nicholas [name] used his machine 27 days with percent use days greater than 4 hours at 77%, indicating good compliance with an average usage of 6 hours and 47 minutes for days on treatment, residual AHI elevated at 13.1/h, primarily due to central events with a central apnea index of 8.9/h, leak acceptable with a 95th percentile at 14.1 L/min on a pressure of 12 cm with EPR of 3.  Nicholas [name] reports being able to tolerate Nicholas mask.  Nicholas [name] is using a hybrid style fullface mask from [employer].  Leak fluctuates.  Nicholas [name] is still adjusting to treatment, Nicholas [name] has not noticed any telltale improvement in his sleep quality.  In Nicholas past year Nicholas [name] has noted cold intolerance.  Nicholas [name] feels especially cold overnight and shivers.  Nicholas [name] uses multiple blankets and has a thermostat set at 78.  Nicholas [name] had some blood work through his primary care within Nicholas past couple of months but is not sure what all was checked.  Nicholas [name] had a telemedicine follow-up with his  GI doctor for his Crohn's.  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  Nicholas [name] also had a stress test with his cardiologist recently.  Nicholas [name] had no medication changes.   Nicholas [name]'s allergies, current medications, family history, past medical history, past social history, past surgical history and problem list were reviewed and updated as appropriate.    Previously:  I saw him on 12/19/20, at which time Nicholas [name] was struggling with his CPAP, and was at 15 cm. Nicholas [name] was advised that Nicholas original order of CPAP of 12 cm was misinterpreted by his DME company. They reported that Nicholas original order came through with a smudge and Nicholas 12 cm look like 17 cm.       I saw him on 07/23/2020 for reevaluation of his sleep disturbance.  Nicholas [name] was complaining of daytime tiredness.  Nicholas [name] was agreeable to pursuing a sleep study.  Nicholas [name] had a baseline sleep study, followed by a CPAP titration study.  His baseline sleep study from 08/06/2020 showed a sleep latency of 32 minutes, REM latency delayed at 202.5 minutes, sleep efficiency 72.2%.  Wake after sleep onset was elevated at 108.5 minutes with mild to moderate sleep fragmentation noted.  Nicholas [name] had an increased percentage of stage II sleep, near absence of slow-wave sleep and REM sleep was 17.6%.  Total  AHI was in Nicholas moderate range at 26.1/h, REM AHI 27/h, supine AHI 39/h.  Average oxygen saturation was 96%, nadir was 82%.  Nicholas [name] was noted to have significant central apneas and these were more than 50% of his respiratory events.  Nicholas [name] had mild periodic limb movements without significant arousals.  Nicholas [name] was advised to return for a full night titration study.  Nicholas [name] had this on 08/28/2020.  Sleep efficiency was 47.8%, sleep latency to persistent sleep delayed at 159.5 minutes and REM latency markedly delayed at 244.5 minutes.  Nicholas [name] was fitted with a full facemask.  Nicholas [name] was started on CPAP therapy at 5 cm and titrated to a final pressure of  12 cm.  His AHI was 0/h, O2 nadir 96% with nonsupine and non-REM sleep achieved only.  Nicholas [name] was advised to start CPAP therapy at home.  Nicholas [name] was again noted to have mild periodic leg movements with minimal arousals.  His set up date was 10/24/2020.   I reviewed his CPAP compliance data from Nicholas past 54 days, since set up, Nicholas [name] used his machine 21 out of 54 days with percent use days greater than 4 hours at 15%, essentially no usage since early January, around 11/16/2020, 1 night of usage on 12/15/2020.  Nicholas [name] had called in Nicholas interim in December reporting that Nicholas pressure was difficult to tolerate and Nicholas [name] was having trouble with Nicholas mask.  Nicholas [name] was advised to meet up with his DME provider for mask issues.   I first met him at Nicholas request of his primary care physician, Dr. Philip Aspen, on 10/20/2019, at which time Nicholas [name] reported snoring and daytime tiredness.  His tiredness improved a little bit in Nicholas recent months.  Nicholas [name] reported some weight loss.  Nicholas [name] was trying to supplement his nutrition with Ensure.  Nicholas [name] was struggling with diarrhea.  Nicholas [name] was advised to proceed with sleep study testing but wanted to delay Nicholas test till after Nicholas first of Nicholas year.  Nicholas [name] did not pursue sleep study testing earlier in Nicholas year.     10/20/19: (Nicholas [name]) reports snoring and daytime tiredness.  His tiredness has improved a little bit in Nicholas past couple of months.  Nicholas [name] has had some unintentional weight loss and has started using Ensure, half a bottle twice daily.  Nicholas [name] still struggles with diarrhea.  Nicholas [name] goes to bed generally around 9-ish and rise time is around 7.  Nicholas [name] does not have night to night nocturia and denies any recurrent morning headaches.  His middle daughter has sleep apnea but does not use her CPAP machine.  Nicholas [name] has woken up with a snorting sound and his wife has noted occasional snorting sounds when Nicholas [name] is asleep.  Nicholas [name] does have some restless sleep at times, Nicholas [name] does not take any sleep aid.  Nicholas [name] had a tonsillectomy at age 56.  Nicholas [name] has a  prescription for Synthroid pending which Nicholas [name] has not yet started. I reviewed your telemedicine note from 10/03/2019, which you kindly included.  His Epworth sleepiness score is 10 out of 24, fatigue severity score is 42 out of 63.  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  Nicholas [name] lives with his wife, no pets in Nicholas house.  Nicholas [name] lost his oldest daughter last year and, tragically, his only granddaughter from her took her own life Nicholas year before at age 35.  His Past Medical History Is Significant For: Past Medical History:  Diagnosis Date   Anemia    PO IRON    Aortic stenosis,  mild    Cataract    Colonic hemorrhage 1964   Crohn's disease ([place])    GERD (gastroesophageal reflux disease)    Heart murmur    Hyperlipidemia    Macular degeneration    PAC (premature atrial contraction)    PAF (paroxysmal atrial fibrillation) ([place])    SEES DR. BRACKBILL   Palpitations     His Past Surgical History Is Significant For: Past Surgical History:  Procedure Laterality Date   APPENDECTOMY     Biopsy Facial  05/10/2019   BLADDER REPAIR  1964   intestine leaks/bladder repair   CARDIOVASCULAR STRESS TEST  11/29/2003   EF 64%   CARDIOVASCULAR STRESS TEST  11/17/2012   No evidence of ischemia; EF 66%; inferior wall attenuation favored to rep diaphragmatic attenuation   CATARACT EXTRACTION W/ INTRAOCULAR LENS  IMPLANT, BILATERAL     2013   ILEOSTOMY  04/16/2012   Procedure: ILEOSTOMY;  Surgeon: Adin Hector, MD;  Location[facility] OR;  Service: General;  Laterality: N/A;   ILEOSTOMY CLOSURE  12/07/2012   ILEOSTOMY CLOSURE  12/07/2012   Procedure: ILEOSTOMY TAKEDOWN;  Surgeon: Adin Hector, MD;  Location: [facility] OR;  Service: General;  Laterality: N/A;   LAPAROTOMY  04/16/2012   Procedure: EXPLORATORY LAPAROTOMY;  Surgeon: Adin Hector, MD;  Location: [place];  Service: General;  Laterality: N/A;   LYSIS OF ADHESION  12/07/2012   Procedure: LYSIS OF ADHESION;  Surgeon: Adin Hector, MD;  Location[facility] OR;  Service: General;   Laterality: N/A;   PROCTOSCOPY  12/07/2012   Procedure: PROCTOSCOPY;  Surgeon: Adin Hector, MD;  Location: [place];  Service: General;;  ILEO [place]   ulcer   TONSILLECTOMY     US ECHOCARDIOGRAPHY  05/20/2010   EF 55-60%   US ECHOCARDIOGRAPHY  10/23/2005   EF 55-60%    His Family History Is Significant For: Family History  Problem Relation Age of Onset   Heart disease Maternal Grandfather    Heart disease Other        maternal side   Congestive Heart Failure Daughter    Colon cancer Neg Hx     His Social History Is Significant For: Social History   Socioeconomic History   Marital status: Married    Spouse name: Not on file   Number of children: 3   Years of education: Not on file   Highest education level: Not on file  Occupational History   Occupation: Retired    Fish farm manager: [employer]  Tobacco Use   Smoking status: Former    Packs/day: 1.00    Years: 2.00    Pack years: 2.00    Types: Cigarettes    Quit date: 11/10/1964    Years since quitting: 56.5   Smokeless tobacco: Never  Vaping Use   Vaping Use: Never used  Substance and Sexual Activity   Alcohol use: No   Drug use: No   Sexual activity: Not on file  Other Topics Concern   Not on file  Social History Narrative   caffeinated drinks less than one a day. Eldest daughter passed from heart deficiency.    Social Determinants of Health   Financial Resource Strain: Not on file  Food Insecurity: Not on file  Transportation Needs: Not on file  Physical Activity: Not on file  Stress: Not on file  Social Connections: Not on file    His Allergies Are:  Not on File:  His Current Medications Are:  Outpatient Encounter Medications as of 05/16/2021  Medication Sig   amLODipine (NORVASC) 2.5 MG tablet Take 2.5 mg by mouth daily.   azaTHIOprine (IMURAN) 50 MG tablet Take 50 mg by mouth daily.   COVID-19 mRNA vaccine, Moderna, 100 MCG/0.5ML injection Inject into Nicholas muscle.    ergocalciferol (VITAMIN D2) 50000 UNITS capsule Take 50,000 Units by mouth once a week.    fenofibrate 160 MG tablet Take 160 mg by mouth daily.   IRON PO Take 2 tablets by mouth daily. Iron 37m   levothyroxine (SYNTHROID) 25 MCG tablet Take 25 mcg by mouth daily before breakfast.   loperamide (IMODIUM) 2 MG capsule Take 2 mg by mouth daily as needed for diarrhea or loose stools.   losartan (COZAAR) 25 MG tablet TAKE 2 TABLETS (50 MG) BY MOUTH EVERY EVENING.   Multiple Vitamin (MULTI VITAMIN DAILY PO) Take 1 tablet by mouth daily.   Multiple Vitamins-Minerals (PRESERVISION AREDS 2) CAPS Take by mouth. 1 in Nicholas am 1 in Nicholas pm   testosterone cypionate (DEPOTESTOSTERONE CYPIONATE) 200 MG/ML injection Inject 1 mL into Nicholas muscle every 21 ( twenty-one) days.   Wheat Dextrin (BENEFIBER DRINK MIX PO) Take 1 Dose by mouth 3 (three) times daily as needed (when not eating a high fiber diet).    [DISCONTINUED] aspirin EC 81 MG tablet Take 81 mg by mouth. M, W, F only   No facility-administered encounter medications on file as of 05/16/2021.  :  Review of Systems:  Out of a complete 14 point review of systems, all are reviewed and negative with Nicholas exception of these symptoms as listed below:  Review of Systems  Neurological:        Pt here today for initial autopap follow up. C/o mask leaking, and states his sleep has not improve.    Objective:  Neurological Exam  Physical Exam Physical Examination:   Vitals:   05/16/21 0829  BP: 127/69  Pulse: 69    General Examination: Nicholas [name] is a very pleasant 80y.o. male in no acute distress. Nicholas [name] appears well-developed and well-nourished and well groomed.   HEENT: Normocephalic, atraumatic, pupils are equal, round and reactive to light, extraocular tracking is good without limitation to gaze excursion or nystagmus noted. Hearing is grossly intact. Face is symmetric with normal facial animation. Speech is clear with no dysarthria noted. There is no  hypophonia. There is no lip, neck/head, jaw or voice tremor. Neck is supple with full range of passive and active motion. There are no carotid bruits on auscultation. Oropharynx exam reveals: mild mouth dryness, adequate dental hygiene and mild airway crowding. Tongue protrudes centrally in palate elevates symmetrically.   Chest: Clear to auscultation without wheezing, rhonchi or crackles noted.   Heart: S1+S2+0, regular and normal without murmurs, rubs or gallops noted.   Abdomen: Soft, non-tender and non-distended.    Extremities: There is no pitting edema in Nicholas distal lower extremities bilaterally.   Skin: Warm and dry without trophic changes noted.   Musculoskeletal: exam reveals no obvious joint deformities.    Neurologically: Mental status: Nicholas [name] is awake, alert and oriented in all 4 spheres. His immediate and remote memory, attention, language skills and fund of knowledge are fairly appropriate. Thought process is linear. Mood is normal and affect is normal. Cranial nerves II - XII are as described above under HEENT exam. Motor exam: Normal bulk, strength and tone is noted. There is no obvious tremor, fine motor skills and  coordination: grossly intact. Cerebellar testing: No dysmetria or intention tremor. There is no truncal or gait ataxia. Sensory exam: intact to light touch. Gait, station and balance: No problems walking.     Assessment and Plan:  In summary, Nicholas [name] is a very pleasant 80 year old male with an underlying medical history of macular degeneration, PACs, paroxysmal A. fib, heart murmur, Crohn's disease, cataract, aortic stenosis, vitamin D deficiency, anemia, and hyperlipidemia, who presents for follow-up consultation of his obstructive sleep apnea. His sleep testing in September 2021 indicated moderate sleep disordered breathing with a primary central component and a lesser obstructive component.  Interestingly, Nicholas [name] did quite well with CPAP of 12 cm.  Nicholas [name]  had a titration study in October 2021.  However, Nicholas [name] did not sleep very well during either night, had periodic leg movements.  Nicholas [name] does not have telltale symptoms of restless leg syndrome.  Unfortunately, Nicholas [name] was originally started on CPAP of 17 cm due to a misinterpretation from his DME company, set up date was 10/24/2020 and lost interim coverage of his CPAP machine.  Nicholas [name] was then restarted on CPAP of 12 cm in mid May 2022 and has been compliant with treatment, residual AHI is suboptimal primarily because of central apneas.  Nicholas [name] is advised that we can continue to try to work with CPAP therapy.  Nicholas [name] has been adjusting to treatment.  Nicholas [name] is encouraged to continue with it.  I would like to reduce his pressure from 12 cm to 11 cm to see if we can improve his central apneas.  Nicholas [name] is advised to call our office or email [place] through [place] in Nicholas next 6 weeks so we can review another download/report remotely.  Nicholas [name] is advised to follow-up routinely in this clinic to see one of our nurse practitioners in 3 to 4 months.  We may consider bringing him in for a titration study if Nicholas [name] continues to have central apneas and Nicholas [name] may need a BiPAP titration in Nicholas future.  Nicholas [name] has been noticing more cold intolerance.  Nicholas [name] had some blood work through his primary care but is not sure what all was checked.  Nicholas [name] is advised to make sure that Nicholas [name] is not anemic, that Nicholas [name] does not have any vitamin B12 deficiency or thyroid dysfunction.  Nicholas [name] is advised to call his primary care office for these issues.  Nicholas [name] is encouraged to continue to follow-up with his specialists as scheduled.  Nicholas [name] sees cardiology and GI as well.  We will send a pressure reduction request to his DME company.  Nicholas [name] is encouraged to call us with any interim questions or concerns.  I answered all his questions today and Nicholas [name] was in agreement with Nicholas plan.I spent 30 minutes in total face-to-face time and in reviewing records during pre-charting, more than 50% of which was spent in counseling and  coordination of care, reviewing test results, reviewing medications and treatment regimen and/or in discussing or reviewing Nicholas diagnosis of CSA, OSA, Nicholas prognosis and treatment options. Pertinent laboratory and imaging test results that were available during this visit with Nicholas [name] were reviewed by me and considered in my medical decision making (see chart for details).

## 2021-05-17 ENCOUNTER — Telehealth: Payer: Self-pay | Admitting: Neurology

## 2021-05-17 NOTE — Telephone Encounter (Signed)
Pt called stating that his cpap pressure was to be changed but last night he was having problems with leakage so he cut it off for a while but when he started up again a bit later it seemed to be fixed. Pt would like to discuss with RN.

## 2021-05-17 NOTE — Telephone Encounter (Signed)
Faxed order to [facility] for pressure changes, received fax confirmation.  [phone].

## 2021-05-20 DIAGNOSIS — G4733 Obstructive sleep apnea (adult) (pediatric): Secondary | ICD-10-CM | POA: Diagnosis not present

## 2021-05-20 NOTE — Telephone Encounter (Signed)
LMVM for pt to return call.  I did relay that did send orders for pressure changes on his cpap to [facility].

## 2021-05-20 NOTE — Telephone Encounter (Signed)
I called pt to follow up and he stated the pressure was at 11cm H20 and that is what is should be set on.  But he stated has so much humidity that [place]  He will see how he does tonight and then let us know if still a problem.  He should be able to adjust humidity of machine on his own.

## 2021-05-22 DIAGNOSIS — L570 Actinic keratosis: Secondary | ICD-10-CM | POA: Diagnosis not present

## 2021-05-23 ENCOUNTER — Telehealth: Payer: Self-pay | Admitting: Neurology

## 2021-05-23 DIAGNOSIS — G4733 Obstructive sleep apnea (adult) (pediatric): Secondary | ICD-10-CM

## 2021-05-23 DIAGNOSIS — G4731 Primary central sleep apnea: Secondary | ICD-10-CM

## 2021-05-23 NOTE — Telephone Encounter (Signed)
Spoke with patient and discussed message from Dr Rexene Alberts. He verbalized understanding and agrees to plan.  I let him know we will send the order to [facility] today and I recommended that he give them a call either later this afternoon if not tomorrow to try to set up an appointment for a mask refit.  He verbalized understanding and appreciation for the call.

## 2021-05-23 NOTE — Telephone Encounter (Signed)
I spoke with the patient.  He had skin cancer removed 1 month ago.  He no longer has sutures there but he has started a 2-week regimen of a chemo salve that he states cannot be covered.  He has used the salve for 1 day.  The location is a little lower than his temple on the left side about 1 to 1-1/2 inches towards his ear.  I let him know a message will be sent to the provider to advise.  He wears a fullface mask.

## 2021-05-23 NOTE — Addendum Note (Signed)
Addended by: Star Age on: 05/23/2021 12:15 PM   Modules accepted: Orders

## 2021-05-23 NOTE — Telephone Encounter (Signed)
Faxed over order to [facility] at [phone] and also called [facility] @ [phone].  Spoke with Andee Poles and was advised the patient had actually already scheduled an appointment with RT for mask refit appt.

## 2021-05-23 NOTE — Telephone Encounter (Signed)
Pt called stating he is not able to wear his mask for his CPAP machine, he recently had a procedure done, and it the mask covers the area of the suture. Pt says it bothers him. He states he called [facility] and they pretty much told him there was nothing they could do, and to refer back to [place]. Pt is requesting a call back.

## 2021-05-23 NOTE — Telephone Encounter (Signed)
The best thing he can think of is that he make a follow-up appointment with his DME provider for a mask refit and explain the area that cannot be covered to them in person and consider a different style of mask with them.  Please relay back to patient.  I will place an order for mask refit.

## 2021-05-24 NOTE — Telephone Encounter (Signed)
Ivin Booty from [facility] called stating if nurse can give a call back about the mask and cancer spot. [phone].

## 2021-05-27 NOTE — Telephone Encounter (Signed)
Spoke to [facility].  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  It hinders finding a mask that works for him at this time.  He is trying to build a bridge over this area but the cream that he is using for this area states nothing should be covering this.  They told pt that he is in compliance and it does not matter if he does not use this all the time until this has healed and so his compliance may show gaps.  FYI.

## 2021-05-31 ENCOUNTER — Other Ambulatory Visit: Payer: Self-pay

## 2021-05-31 ENCOUNTER — Encounter: Payer: Self-pay | Admitting: Student

## 2021-05-31 ENCOUNTER — Ambulatory Visit: Payer: PPO | Admitting: Student

## 2021-05-31 VITALS — BP 127/69 | HR 60 | Temp 98.0°F | Resp 17 | Ht 69.0 in | Wt 157.8 lb

## 2021-05-31 DIAGNOSIS — I10 Essential (primary) hypertension: Secondary | ICD-10-CM | POA: Diagnosis not present

## 2021-05-31 NOTE — Progress Notes (Signed)
Primary Physician:  Leanna Battles, MD   Patient ID: Nicholas [name], male    DOB: 1941/04/09, 80 y.o.   MRN: [id number]  Chief Complaint  Patient presents with   Primary hypertension    3 months    HPI   Nicholas [name]  is a 80 y.o. male  with chronic dyspnea on exertion, sleep apnea (on CPAP), dyslipidemia, diet-controlled diabetes mellitus, Crohn's disease on Aziothioprine,  admitted to [facility] in January 2014 when he had reversal of ileostomy postoperatively on 11/12/2012 had wide complex tachycardia and also narrow complex tachycardia suggestive of PSVT and also A. Flutter with abberancy. He has diagnosis of paroxysmal atrial fibrillation by history in January 2005, he is only on ASA therapy per patient choice.   Patient presents for 25-monthfollow-up of hypertension and fatigue. At last visit no changes were made as patient was stable from cardiovascular standpoint.  Patient continues to do well without specific cardiovascular complaints.  He does state that he has been feeling cold and night over the last several months, for which she is following with his PCP for further evaluation and management.Denies chest pain, palpitations, dizziness, syncope, leg swelling, orthopnea.  Patient's blood pressure is well controlled and he states his fatigue has improved since last visit.  Past Medical History:  Diagnosis Date   Anemia    PO IRON    Aortic stenosis, mild    Cataract    Colonic hemorrhage 1964   Crohn's disease ([place]    GERD (gastroesophageal reflux disease)    Heart murmur    Hyperlipidemia    Macular degeneration    PAC (premature atrial contraction)    [place]    SEES DR. BRACKBILL   Palpitations    Past Surgical History:  Procedure Laterality Date   APPENDECTOMY     Biopsy Facial  05/10/2019   BLADDER REPAIR  1964   intestine leaks/bladder repair   CARDIOVASCULAR STRESS TEST  11/29/2003   EF 64%   CARDIOVASCULAR STRESS  TEST  11/17/2012   No evidence of ischemia; EF 66%; inferior wall attenuation favored to rep diaphragmatic attenuation   CATARACT EXTRACTION W/ INTRAOCULAR LENS  IMPLANT, BILATERAL     2013   ILEOSTOMY  04/16/2012   Procedure: ILEOSTOMY;  Surgeon: HAdin Hector MD;  [place]  Service: General;  Laterality: N/A;   ILEOSTOMY CLOSURE  12/07/2012   ILEOSTOMY CLOSURE  12/07/2012   Procedure: ILEOSTOMY TAKEDOWN;  Surgeon: HAdin Hector MD;  [place]  Service: General;  Laterality: N/A;   LAPAROTOMY  04/16/2012   Procedure: EXPLORATORY LAPAROTOMY;  Surgeon: HAdin Hector MD;  [place] [facility]  Service: General;  Laterality: N/A;   LYSIS OF ADHESION  12/07/2012   Procedure: LYSIS OF ADHESION;  Surgeon: HAdin Hector MD;  [place] [facility]  Service: General;  Laterality: N/A;   PROCTOSCOPY  12/07/2012   Procedure: PROCTOSCOPY;  Surgeon: HAdin Hector MD;  [place]  Service: General;;  ILEO PROCTOSCOPY   STOMACH SURGERY  1959   ulcer   TONSILLECTOMY     [place]CH[place]ARDIOGRAPHY  05/20/2010   EF 55-60%   [place]APHY  10/23/2005   EF 55-60%   Family History  Problem Relation Age of Onset   Stroke Mother    Multiple myeloma Father    Heart disease Maternal Grandfather    Congestive Heart Failure Daughter    Heart disease Other  maternal side   Colon cancer Neg Hx    Social History   Tobacco Use   Smoking status: Former    Packs/day: 1.00    Years: 2.00    Pack years: 2.00    Types: Cigarettes    Quit date: 11/10/1964    Years since quitting: 56.5   Smokeless tobacco: Never  Substance Use Topics   Alcohol use: No    Marital Status: Married  ROS:   Review of Systems  Cardiovascular:  Positive for dyspnea on exertion (chronic, stable). Negative for chest pain, claudication, leg swelling, orthopnea, palpitations, paroxysmal nocturnal dyspnea and syncope.  Hematologic/Lymphatic: Does not bruise/bleed easily.  Gastrointestinal:  Negative for  melena.   Objective:  Blood pressure 127/69, pulse 60, temperature 98 F (36.7 C), temperature source Temporal, resp. rate 17, height '5\' 9"'  (1.753 m), weight 157 lb 12.8 oz (71.6 kg), SpO2 99 %. Body mass index is 23.3 kg/m.  Vitals with BMI 05/31/2021 05/16/2021 02/28/2021  Height '5\' 9"'  '5\' 9"'  -  Weight 157 lbs 13 oz 155 lbs -  BMI 16.10 96.04 -  Systolic 540 981 191  Diastolic 69 69 60  Pulse 60 69 62    Physical Exam Vitals reviewed.  Constitutional:      General: He is not in acute distress.    Appearance: He is well-developed.  Cardiovascular:     Rate and Rhythm: Regular rhythm. Bradycardia present.     Pulses: Intact distal pulses.          Carotid pulses are 2+ on the right side and 2+ on the left side.      Radial pulses are 2+ on the right side and 2+ on the left side.       Dorsalis pedis pulses are 2+ on the right side and 2+ on the left side.       Posterior tibial pulses are 1+ on the right side and 1+ on the left side.     Heart sounds: Murmur heard.  Mid to late systolic murmur is present with a grade of 2/6 at the lower left sternal border.     Comments: No leg edema.  Pulmonary:     Effort: Pulmonary effort is normal. No accessory muscle usage.     Breath sounds: Normal breath sounds.  Musculoskeletal:     Right lower leg: No edema.     Left lower leg: No edema.  Neurological:     Mental Status: He is alert.   Laboratory examination:   CMP Latest Ref Rng & Units 04/26/2020 02/13/2020 07/14/2016  Glucose 65 - 99 mg/dL 106(H) 100(H) 109(H)  BUN 8 - 27 mg/dL '13 19 10  ' Creatinine 0.76 - 1.27 mg/dL 1.27 1.32(H) 1.32(H)  Sodium 134 - 144 mmol/L 139 141 135  Potassium 3.5 - 5.2 mmol/L 4.5 4.5 4.0  Chloride 96 - 106 mmol/L 104 108 107  CO2 20 - 29 mmol/L 24 19(L) 24  Calcium 8.6 - 10.2 mg/dL 9.5 10.1 8.7(L)  Total Protein 6.0 - 8.3 g/dL - - -  Total Bilirubin 0.3 - 1.2 mg/dL - - -  Alkaline Phos 39 - 117 U/L - - -  AST 0 - 37 U/L - - -  ALT 0 - 53 U/L - - -    CBC Latest Ref Rng & Units 02/13/2020 07/14/2016 02/24/2015  WBC 4.0 - 10.5 K/uL 7.3 4.9 8.1  Hemoglobin 13.0 - 17.0 g/dL 14.9 13.9 12.3(L)  Hematocrit 39.0 - 52.0 % 45.0 42.5  36.8(L)  Platelets 150 - 400 K/uL 282 205 158   Lipid Panel     Component Value Date/Time   CHOL 60 04/19/2012 0411   TRIG 103 04/19/2012 0411   HEMOGLOBIN A1C No results found for: HGBA1C, MPG TSH No results for input(s): TSH in the last 8760 hours.  External labs: 01/05/2020:  HbA1C 5.5% Albumin 4.2, Total Bilirubin 0.4, Bilirubin, Direct 0.1, Alkaline Phosphatase 57, AST 22, ALT 13, Total Protein 6.3.   12/28/2019:  BUN 20, Creatinine 1.21, Na/K 137/4.7, Glucose 167, eGFR 56,  Hemoglobin 13.4, MCV 97.9, RBC 4.04, WBC 7.7.   Cholesterol, total 139.000 m 09/26/2019 Triglycerides 111.000 09/26/2019 HDL 50 MG/DL 09/26/2019 LDL 67.000 mg 09/26/2019  A1C 5.300 % 09/26/2019; TSH 5.080 09/26/2019  Allergies  No Known Allergies    Medications Prior to Visit:   Outpatient Medications Prior to Visit  Medication Sig Dispense Refill   amLODipine (NORVASC) 2.5 MG tablet Take 2.5 mg by mouth daily.     azaTHIOprine (IMURAN) 50 MG tablet Take 50 mg by mouth daily.     ergocalciferol (VITAMIN D2) 50000 UNITS capsule Take 50,000 Units by mouth once a week.      fenofibrate 160 MG tablet Take 160 mg by mouth daily.     fluorouracil (EFUDEX) 5 % cream Apply topically 2 (two) times daily.     IRON PO Take 2 tablets by mouth daily. Iron 12m     levothyroxine (SYNTHROID) 25 MCG tablet Take 25 mcg by mouth daily before breakfast.     loperamide (IMODIUM) 2 MG capsule Take 2 mg by mouth daily as needed for diarrhea or loose stools.     losartan (COZAAR) 25 MG tablet TAKE 2 TABLETS (50 MG) BY MOUTH EVERY EVENING. 60 tablet 2   Methylcellulose, Laxative, (CITRUCEL PO) Take by mouth as needed.     Multiple Vitamin (MULTI VITAMIN DAILY PO) Take 1 tablet by mouth daily.     Multiple Vitamins-Minerals (PRESERVISION  AREDS 2) CAPS Take by mouth. 1 in the am 1 in the pm     testosterone cypionate (DEPOTESTOSTERONE CYPIONATE) 200 MG/ML injection Inject 1 mL into the muscle every 21 ( twenty-one) days.  5   COVID-19 mRNA vaccine, Moderna, 100 MCG/0.5ML injection Inject into the muscle. 0.5 mL 0   metoprolol succinate (TOPROL-XL) 25 MG 24 hr tablet Take 25 mg by mouth daily.     Wheat Dextrin (BENEFIBER DRINK MIX PO) Take 1 Dose by mouth 3 (three) times daily as needed (when not eating a high fiber diet).      No facility-administered medications prior to visit.   Final Medications at End of Visit    Current Meds  Medication Sig   amLODipine (NORVASC) 2.5 MG tablet Take 2.5 mg by mouth daily.   azaTHIOprine (IMURAN) 50 MG tablet Take 50 mg by mouth daily.   ergocalciferol (VITAMIN D2) 50000 UNITS capsule Take 50,000 Units by mouth once a week.    fenofibrate 160 MG tablet Take 160 mg by mouth daily.   fluorouracil (EFUDEX) 5 % cream Apply topically 2 (two) times daily.   IRON PO Take 2 tablets by mouth daily. Iron 672m  levothyroxine (SYNTHROID) 25 MCG tablet Take 25 mcg by mouth daily before breakfast.   loperamide (IMODIUM) 2 MG capsule Take 2 mg by mouth daily as needed for diarrhea or loose stools.   losartan (COZAAR) 25 MG tablet TAKE 2 TABLETS (50 MG) BY MOUTH EVERY EVENING.   Methylcellulose, Laxative, (CITRUCEL PO)  Take by mouth as needed.   Multiple Vitamin (MULTI VITAMIN DAILY PO) Take 1 tablet by mouth daily.   Multiple Vitamins-Minerals (PRESERVISION AREDS 2) CAPS Take by mouth. 1 in the am 1 in the pm   testosterone cypionate (DEPOTESTOSTERONE CYPIONATE) 200 MG/ML injection Inject 1 mL into the muscle every 21 ( twenty-one) days.   Radiology:   Chest X-Ray 02/13/2020: No active cardiopulmonary disease.  MRI abdomen with and without contrast 03/07/2020: 1.  Biliary and pancreatic ductal dilatation with abrupt cutoff of the common bile duct proximal to the ampulla as described above. Subtle  periampullary enhancement. Recommend correlation with ERCP, as underlying lesion could have this appearance. 2.  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 3.  Mild hepatic steatosis.  CT abdomen 01/02/2020: 1.  Status post subtotal colectomy with ileorectal anastomosis. No CT evidence of active disease. 2.  Mild biliary ductal fullness with smooth tapering toward the level of the ampulla, nonspecific. Correlate with biliary labs and ERCP if elevated.  3.  Sequelae of chronic pancreatitis with the pancreatic duct ectasia and punctate parenchymal calcifications. Subcentimeter cystic focus within the pancreatic head may reflect a side branch intraductal papillary mucinous neoplasm versus sequelae of pseudocyst. Consider 6-12 month follow-up with pancreas protocol MR.  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 Also had narrow complex tachy for 15 minutes that again is suspicious for A. Fl with 2:1 conduction vs atrial tachycardia vs PSVT.  Event Monitor 09/16/2017: Sinus rhythm, heart rate 37-126 bpm. symptoms of fatigue with sinus rhythm at 80 bpm. No atrial fibrillation, atrial flutter  Echocardiogram 02/28/2020:  Normal LV systolic function with visual EF 50-55%. Left ventricle cavity is normal in size. Moderate left ventricular hypertrophy. Normal global wall motion. Normal diastolic filling pattern, normal LAP.  Structurally normal mitral valve. No evidence of mitral stenosis. Mild (Grade I) mitral regurgitation.  Structurally normal tricuspid valve. No evidence of tricuspid stenosis.  Mild tricuspid regurgitation.  Compared to previous study dated 09/28/2017 Mild MR and TR are new. LVEF remains stable.  Exercise tetrofosmin stress test  02/29/2020: Normal ECG stress. The baseline blood pressure was 172/90 mmHg and increased to 208/80 mmHg, which is a hypertensive response to exercise. The patient  exercised for 4 minutes and 45 seconds of a Bruce protocol, achieving approximately 7 METs.  Myocardial perfusion is normal. Overall LV systolic function is normal without regional wall motion abnormalities. Stress LV EF: 71%.  Compared to 09/21/2017, no significant change.  EKG   EKG 02/28/2021: Sinus bradycardia at a rate of 56 bpm.  Normal axis.  Early repolarization abnormality.  No evidence of ischemia or underlying injury pattern.  Compared to EKG 11/29/2020, bradycardia  has improved.  EKG 11/29/2020: Marked sinus bradycardia at rate of 46 bpm, otherwise normal EKG.  Compared to 02/17/2020, heart rate was 65 bpm and on 11/30/2019, heart rate was 56 bpm.     Assessment:     ICD-10-CM   1. Primary hypertension  I10       No orders of the defined types were placed in this encounter.  Medications Discontinued During This Encounter  Medication Reason   COVID-19 mRNA vaccine, Moderna, 100 MCG/0.5ML injection Error   Wheat Dextrin (BENEFIBER DRINK MIX PO) Error   metoprolol succinate (TOPROL-XL) 25 MG 24 hr tablet Error   This patients CHA2DS2-VASc Score 3 (DM, Age)and yearly risk of stroke 3.2%.   Recommendations:   Nicholas [name]  is  a 80 y.o. 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 Flutter with abberancy. He has diagnosis of paroxysmal atrial fibrillation by history in January 2005, he is only on ASA therapy per patient choice and has not had any recurrence of palpitations.   Patient presents for 34-monthfollow-up of hypertension and fatigue. At last visit no changes were made as  patient was stable from cardiovascular standpoint.  Patient is relatively asymptomatic and there is no clinical evidence of heart failure.  Blood pressure is well controlled.  We will request recent labs from PCP as there is no recent lipid profile testing for review.  Advised patient to follow-up with PCP regarding night chills.  He is otherwise stable from a cardiovascular standpoint, no changes made today.  Follow-up in 6 months, sooner if needed, for hypertension, dyslipidemia.    CAlethia Berthold PA-C 05/31/2021, 2:34 PM Office: [phone]

## 2021-06-03 NOTE — Telephone Encounter (Signed)
Noted, thank you, nothing further needed.

## 2021-06-11 DIAGNOSIS — H6123 Impacted cerumen, bilateral: Secondary | ICD-10-CM | POA: Diagnosis not present

## 2021-06-13 DIAGNOSIS — K8689 Other specified diseases of pancreas: Secondary | ICD-10-CM | POA: Diagnosis not present

## 2021-06-13 DIAGNOSIS — K5090 Crohn's disease, unspecified, without complications: Secondary | ICD-10-CM | POA: Diagnosis not present

## 2021-06-13 DIAGNOSIS — K838 Other specified diseases of biliary tract: Secondary | ICD-10-CM | POA: Insufficient documentation

## 2021-06-13 DIAGNOSIS — K862 Cyst of pancreas: Secondary | ICD-10-CM | POA: Diagnosis not present

## 2021-06-13 DIAGNOSIS — K861 Other chronic pancreatitis: Secondary | ICD-10-CM | POA: Diagnosis not present

## 2021-06-13 DIAGNOSIS — D490 Neoplasm of unspecified behavior of digestive system: Secondary | ICD-10-CM | POA: Diagnosis not present

## 2021-06-13 DIAGNOSIS — I4891 Unspecified atrial fibrillation: Secondary | ICD-10-CM | POA: Diagnosis not present

## 2021-06-18 DIAGNOSIS — H353132 Nonexudative age-related macular degeneration, bilateral, intermediate dry stage: Secondary | ICD-10-CM | POA: Diagnosis not present

## 2021-06-19 DIAGNOSIS — L2084 Intrinsic (allergic) eczema: Secondary | ICD-10-CM | POA: Diagnosis not present

## 2021-06-20 DIAGNOSIS — G4733 Obstructive sleep apnea (adult) (pediatric): Secondary | ICD-10-CM | POA: Diagnosis not present

## 2021-06-26 DIAGNOSIS — G4730 Sleep apnea, unspecified: Secondary | ICD-10-CM | POA: Diagnosis not present

## 2021-06-26 DIAGNOSIS — K8689 Other specified diseases of pancreas: Secondary | ICD-10-CM | POA: Diagnosis not present

## 2021-06-26 DIAGNOSIS — K838 Other specified diseases of biliary tract: Secondary | ICD-10-CM | POA: Diagnosis not present

## 2021-06-26 DIAGNOSIS — K862 Cyst of pancreas: Secondary | ICD-10-CM | POA: Diagnosis not present

## 2021-06-26 DIAGNOSIS — Z9884 Bariatric surgery status: Secondary | ICD-10-CM | POA: Diagnosis not present

## 2021-07-02 ENCOUNTER — Telehealth: Payer: Self-pay | Admitting: Neurology

## 2021-07-02 NOTE — Telephone Encounter (Signed)
LMVM for pt that returned call.  °

## 2021-07-02 NOTE — Telephone Encounter (Signed)
Pt called stating that he is needing to speak to the RN regarding his cpap. Pt has not been able to use his cpap due to having skin surgery on the side of his face where the strap of the cpap goes and he is not to cover it for 3-4 months. Please advise.

## 2021-07-02 NOTE — Telephone Encounter (Signed)
Called busy signal.

## 2021-07-02 NOTE — Telephone Encounter (Signed)
I spoke to patient.  He finished his last treatment June 05, 2021 a chemo based appointment.  The dermatologist stated it would take 2 to 6 months for the redness to go away all.  He has tried to use his CPAP mask but now it seems to stick so he is not used it.  He will see his dermatologist in 2 weeks and will discussed with him.  He will be in town tomorrow and stated he will drop off a copy /picture what his surgical area looks like.  I relayed that she may not need that but will be glad to pass it on.  He appreciated call back.   [name]

## 2021-07-17 DIAGNOSIS — L570 Actinic keratosis: Secondary | ICD-10-CM | POA: Diagnosis not present

## 2021-07-17 DIAGNOSIS — Z85828 Personal history of other malignant neoplasm of skin: Secondary | ICD-10-CM | POA: Diagnosis not present

## 2021-07-17 DIAGNOSIS — Z08 Encounter for follow-up examination after completed treatment for malignant neoplasm: Secondary | ICD-10-CM | POA: Diagnosis not present

## 2021-07-21 DIAGNOSIS — G4733 Obstructive sleep apnea (adult) (pediatric): Secondary | ICD-10-CM | POA: Diagnosis not present

## 2021-07-22 ENCOUNTER — Ambulatory Visit: Payer: PPO | Admitting: Cardiology

## 2021-07-28 ENCOUNTER — Ambulatory Visit (HOSPITAL_COMMUNITY)
Admission: EM | Admit: 2021-07-28 | Discharge: 2021-07-28 | Disposition: A | Discharge status: Home / Self Care | Payer: PPO

## 2021-07-28 ENCOUNTER — Other Ambulatory Visit: Payer: Self-pay

## 2021-07-28 ENCOUNTER — Encounter (HOSPITAL_COMMUNITY): Payer: Self-pay | Admitting: *Deleted

## 2021-07-28 DIAGNOSIS — B029 Zoster without complications: Secondary | ICD-10-CM | POA: Diagnosis not present

## 2021-07-28 HISTORY — DX: Squamous cell carcinoma of skin, unspecified: C44.92

## 2021-07-28 MED ORDER — VALACYCLOVIR HCL 1 G PO TABS: 1000.0000 mg | ORAL_TABLET | Freq: Three times a day (TID) | ORAL | 0 refills | Status: AC

## 2021-07-28 NOTE — ED Triage Notes (Signed)
States started with a "sensitive pain" to skin under left axilla, extending into left chest and left upper arm onset approx 4 days ago.  States noticed rash to area last night.  Denies fevers.

## 2021-07-28 NOTE — Discharge Instructions (Addendum)
Take medication as prescribed. Call to make follow up with PCP or return sooner with any further concerns.

## 2021-07-28 NOTE — ED Provider Notes (Signed)
[facility]    CSN: [id number] Arrival date & time: 07/28/21  1010      History   Chief Complaint Chief Complaint  Patient presents with   Herpes Zoster    HPI Nicholas [name] is a 80 y.o. male.   Patient here today for evaluation of rash to his left axilla that he first noted yesterday.  He reports he had noted some sensitivity to this area a few days prior to seeing rash.  He denies any history of shingles in the past.  He has not had any fever.  He denies any nausea or vomiting.  Has not tried any treatment for symptoms.    Past Medical History:  Diagnosis Date   Anemia    PO IRON    Aortic stenosis, mild    Cataract    Colonic hemorrhage 11/10/1962   Crohn's disease (HCC)    GERD (gastroesophageal reflux disease)    Heart murmur    Hyperlipidemia    Macular degeneration    PAC (premature atrial contraction)    PAF (paroxysmal atrial fibrillation) ([place])    SEES DR. BRACKBILL   Palpitations    Squamous cell skin cancer     Patient Active Problem List   Diagnosis Date Noted   Sepsis ([place]) 02/23/2015   SIRS (systemic inflammatory response syndrome) ([place]) 02/23/2015   Atrial tachycardia ([place]) 12/11/2012   Nonspecific (abnormal) findings on radiological and other examination of gastrointestinal tract 04/10/2012   Abdominal pain, right lower quadrant 04/10/2012   Aortic stenosis, mild 06/18/2011   Paroxysmal atrial fibrillation ([place]) 06/18/2011   Crohn's disease ([place]) 06/18/2011    Past Surgical History:  Procedure Laterality Date   APPENDECTOMY     Biopsy Facial  05/10/2019   BLADDER REPAIR  1964   intestine leaks/bladder repair   CARDIOVASCULAR STRESS TEST  11/29/2003   EF 64%   CARDIOVASCULAR STRESS TEST  11/17/2012   No evidence of ischemia; EF 66%; inferior wall attenuation favored to rep diaphragmatic attenuation   CATARACT EXTRACTION W/ INTRAOCULAR LENS  IMPLANT, BILATERAL     2013   ILEOSTOMY  04/16/2012   Procedure: ILEOSTOMY;  Surgeon:  Adin Hector, MD;  Location: [place];  Service: General;  Laterality: N/A;   ILEOSTOMY CLOSURE  12/07/2012   ILEOSTOMY CLOSURE  12/07/2012   Procedure: ILEOSTOMY TAKEDOWN;  Surgeon: Adin Hector, MD;  Location: [place];  Service: General;  Laterality: N/A;   LAPAROTOMY  04/16/2012   Procedure: EXPLORATORY LAPAROTOMY;  Surgeon: Adin Hector, MD;  Location: [place];  Service: General;  Laterality: N/A;   LYSIS OF ADHESION  12/07/2012   Procedure: LYSIS OF ADHESION;  Surgeon: Adin Hector, MD;  Location: [place];  Service: General;  Laterality: N/A;   PROCTOSCOPY  12/07/2012   Procedure: PROCTOSCOPY;  Surgeon: Adin Hector, MD;  Location: [facility];  Service: General;;  ILEO PROCTOSCOPY   STOMACH SURGERY  1959   ulcer   TONSILLECTOMY     US ECHOCARDIOGRAPHY  05/20/2010   EF 55-60%   US ECHOCARDIOGRAPHY  10/23/2005   EF 55-60%       Home Medications    Prior to Admission medications   Medication Sig Start Date End Date Taking? Authorizing Provider  amLODipine (NORVASC) 2.5 MG tablet Take 2.5 mg by mouth daily. 07/17/20  Yes [provider]  ASPIRIN 81 PO Take by mouth.   Yes [provider]  azaTHIOprine (IMURAN) 50 MG tablet Take 50 mg by  mouth daily.   Yes [provider]  ergocalciferol (VITAMIN D2) 50000 UNITS capsule Take 50,000 Units by mouth once a week.    Yes [provider]  fenofibrate 160 MG tablet Take 160 mg by mouth daily.   Yes [provider]  fluorouracil (EFUDEX) 5 % cream Apply topically 2 (two) times daily. 05/22/21  Yes [provider]  IRON PO Take 2 tablets by mouth daily. Iron 71m   Yes [provider]  levothyroxine (SYNTHROID) 25 MCG tablet Take 25 mcg by mouth daily before breakfast.   Yes [provider]  loperamide (IMODIUM) 2 MG capsule Take 2 mg by mouth daily as needed for diarrhea or loose stools.   Yes [provider]  losartan (COZAAR) 25 MG tablet TAKE 2 TABLETS (50 MG)  BY MOUTH EVERY EVENING. 05/10/21  Yes Cantwell, Celeste C, PA-C  Methylcellulose, Laxative, (CITRUCEL PO) Take by mouth as needed.   Yes [provider]  metoprolol tartrate (LOPRESSOR) 25 MG tablet Take 25 mg by mouth 2 (two) times daily.   Yes [provider]  Multiple Vitamin (MULTI VITAMIN DAILY PO) Take 1 tablet by mouth daily.   Yes [provider]  Multiple Vitamins-Minerals (PRESERVISION AREDS 2) CAPS Take by mouth. 1 in the am 1 in the pm   Yes GAdrian Prows MD  testosterone cypionate (DEPOTESTOSTERONE CYPIONATE) 200 MG/ML injection Inject 1 mL into the muscle every 21 ( twenty-one) days. 10/10/15  Yes [provider]  valACYclovir (VALTREX) 1000 MG tablet Take 1 tablet (1,000 mg total) by mouth 3 (three) times daily for 7 days. 07/28/21 08/04/21 Yes MFrancene Finders PA-C    Family History Family History  Problem Relation Age of Onset   Stroke Mother    Multiple myeloma Father    Heart disease Maternal Grandfather    Congestive Heart Failure Daughter    Heart disease Other        maternal side   Colon cancer Neg Hx     Social History Social History   Tobacco Use   Smoking status: Former    Packs/day: 1.00    Years: 2.00    Pack years: 2.00    Types: Cigarettes    Quit date: 11/10/1964    Years since quitting: 56.7   Smokeless tobacco: Never  Vaping Use   Vaping Use: Never used  Substance Use Topics   Alcohol use: No   Drug use: No     Allergies   Patient has no known allergies.   Review of Systems Review of Systems  Constitutional:  Negative for chills and fever.  Eyes:  Negative for discharge and redness.  Respiratory:  Negative for shortness of breath.   Gastrointestinal:  Negative for nausea and vomiting.  Skin:  Positive for rash.    Physical Exam Triage Vital Signs ED Triage Vitals  Enc Vitals Group     BP 07/28/21 1104 (!) 147/85     Pulse Rate 07/28/21 1104 89     Resp 07/28/21 1104 18     Temp 07/28/21 1104  98.4 F (36.9 C)     Temp Source 07/28/21 1104 Oral     SpO2 07/28/21 1104 98 %     Weight --      Height --      Head Circumference --      Peak Flow --      Pain Score 07/28/21 1106 2     Pain Loc --  Pain Edu? --      Excl. in [place]? --    No data found.  Updated Vital Signs BP (!) 147/85   Pulse 89   Temp 98.4 F (36.9 C) (Oral)   Resp 18   SpO2 98%      Physical Exam Vitals and nursing note reviewed.  Constitutional:      General: He is not in acute distress.    Appearance: Normal appearance. He is not ill-appearing.  HENT:     Head: Normocephalic and atraumatic.  Eyes:     Conjunctiva/sclera: Conjunctivae normal.  Cardiovascular:     Rate and Rhythm: Normal rate and regular rhythm.     Heart sounds: Normal heart sounds. No murmur heard. Pulmonary:     Effort: Pulmonary effort is normal. No respiratory distress.     Breath sounds: Normal breath sounds. No wheezing, rhonchi or rales.  Skin:    General: Skin is warm and dry.     Findings: Rash present. Erythema: erythematous vesicular lesions clustered to left upper pectoral area and left axilla following dermatome. Neurological:     Mental Status: He is alert.  Psychiatric:        Mood and Affect: Mood normal.        Thought Content: Thought content normal.     UC Treatments / Results  Labs (all labs ordered are listed, but only abnormal results are displayed) Labs Reviewed - No data to display  EKG   Radiology No results found.  Procedures Procedures (including critical care time)  Medications Ordered in UC Medications - No data to display  Initial Impression / Assessment and Plan / UC Course  I have reviewed the triage vital signs and the nursing notes.  Pertinent labs & imaging results that were available during my care of the patient were reviewed by me and considered in my medical decision making (see chart for details).  Discussed herpes zoster with patient, and antiviral prescribed.   Encouraged follow-up with PCP.  Final Clinical Impressions(s) / UC Diagnoses   Final diagnoses:  Herpes zoster without complication     Discharge Instructions      Take medication as prescribed. Call to make follow up with PCP or return sooner with any further concerns.      ED Prescriptions     Medication Sig Dispense Auth. Provider   valACYclovir (VALTREX) 1000 MG tablet Take 1 tablet (1,000 mg total) by mouth 3 (three) times daily for 7 days. 21 tablet Francene Finders, PA-C      PDMP not reviewed this encounter.   Francene Finders, PA-C 07/28/21 1122

## 2021-08-05 DIAGNOSIS — B029 Zoster without complications: Secondary | ICD-10-CM | POA: Diagnosis not present

## 2021-08-05 DIAGNOSIS — Z7185 Encounter for immunization safety counseling: Secondary | ICD-10-CM | POA: Diagnosis not present

## 2021-08-09 ENCOUNTER — Other Ambulatory Visit: Payer: Self-pay | Admitting: Student

## 2021-08-09 DIAGNOSIS — I10 Essential (primary) hypertension: Secondary | ICD-10-CM

## 2021-08-20 DIAGNOSIS — G4733 Obstructive sleep apnea (adult) (pediatric): Secondary | ICD-10-CM | POA: Diagnosis not present

## 2021-08-28 ENCOUNTER — Ambulatory Visit: Payer: PPO | Admitting: Family Medicine

## 2021-09-17 DIAGNOSIS — Z09 Encounter for follow-up examination after completed treatment for conditions other than malignant neoplasm: Secondary | ICD-10-CM | POA: Diagnosis not present

## 2021-09-17 DIAGNOSIS — L578 Other skin changes due to chronic exposure to nonionizing radiation: Secondary | ICD-10-CM | POA: Diagnosis not present

## 2021-09-17 DIAGNOSIS — Z872 Personal history of diseases of the skin and subcutaneous tissue: Secondary | ICD-10-CM | POA: Diagnosis not present

## 2021-09-20 DIAGNOSIS — G4733 Obstructive sleep apnea (adult) (pediatric): Secondary | ICD-10-CM | POA: Diagnosis not present

## 2021-10-18 ENCOUNTER — Other Ambulatory Visit (HOSPITAL_COMMUNITY): Payer: Self-pay

## 2021-10-20 DIAGNOSIS — G4733 Obstructive sleep apnea (adult) (pediatric): Secondary | ICD-10-CM | POA: Diagnosis not present

## 2021-10-24 DIAGNOSIS — C44329 Squamous cell carcinoma of skin of other parts of face: Secondary | ICD-10-CM | POA: Diagnosis not present

## 2021-10-24 DIAGNOSIS — D225 Melanocytic nevi of trunk: Secondary | ICD-10-CM | POA: Diagnosis not present

## 2021-10-24 DIAGNOSIS — D492 Neoplasm of unspecified behavior of bone, soft tissue, and skin: Secondary | ICD-10-CM | POA: Diagnosis not present

## 2021-10-24 DIAGNOSIS — L821 Other seborrheic keratosis: Secondary | ICD-10-CM | POA: Diagnosis not present

## 2021-10-24 DIAGNOSIS — B0229 Other postherpetic nervous system involvement: Secondary | ICD-10-CM | POA: Diagnosis not present

## 2021-10-24 DIAGNOSIS — L819 Disorder of pigmentation, unspecified: Secondary | ICD-10-CM | POA: Diagnosis not present

## 2021-10-24 DIAGNOSIS — C4441 Basal cell carcinoma of skin of scalp and neck: Secondary | ICD-10-CM | POA: Diagnosis not present

## 2021-10-24 DIAGNOSIS — L814 Other melanin hyperpigmentation: Secondary | ICD-10-CM | POA: Diagnosis not present

## 2021-10-24 DIAGNOSIS — L570 Actinic keratosis: Secondary | ICD-10-CM | POA: Diagnosis not present

## 2021-11-15 DIAGNOSIS — R7301 Impaired fasting glucose: Secondary | ICD-10-CM | POA: Diagnosis not present

## 2021-11-15 DIAGNOSIS — I10 Essential (primary) hypertension: Secondary | ICD-10-CM | POA: Diagnosis not present

## 2021-11-15 DIAGNOSIS — E291 Testicular hypofunction: Secondary | ICD-10-CM | POA: Diagnosis not present

## 2021-11-15 DIAGNOSIS — Z125 Encounter for screening for malignant neoplasm of prostate: Secondary | ICD-10-CM | POA: Diagnosis not present

## 2021-11-15 DIAGNOSIS — E785 Hyperlipidemia, unspecified: Secondary | ICD-10-CM | POA: Diagnosis not present

## 2021-11-15 DIAGNOSIS — E039 Hypothyroidism, unspecified: Secondary | ICD-10-CM | POA: Diagnosis not present

## 2021-11-20 DIAGNOSIS — G4733 Obstructive sleep apnea (adult) (pediatric): Secondary | ICD-10-CM | POA: Diagnosis not present

## 2021-11-22 DIAGNOSIS — N1831 Chronic kidney disease, stage 3a: Secondary | ICD-10-CM | POA: Diagnosis not present

## 2021-11-22 DIAGNOSIS — E785 Hyperlipidemia, unspecified: Secondary | ICD-10-CM | POA: Diagnosis not present

## 2021-11-22 DIAGNOSIS — Z1331 Encounter for screening for depression: Secondary | ICD-10-CM | POA: Diagnosis not present

## 2021-11-22 DIAGNOSIS — B0229 Other postherpetic nervous system involvement: Secondary | ICD-10-CM | POA: Diagnosis not present

## 2021-11-22 DIAGNOSIS — I10 Essential (primary) hypertension: Secondary | ICD-10-CM | POA: Diagnosis not present

## 2021-11-22 DIAGNOSIS — D84821 Immunodeficiency due to drugs: Secondary | ICD-10-CM | POA: Diagnosis not present

## 2021-11-22 DIAGNOSIS — G4733 Obstructive sleep apnea (adult) (pediatric): Secondary | ICD-10-CM | POA: Diagnosis not present

## 2021-11-22 DIAGNOSIS — R82998 Other abnormal findings in urine: Secondary | ICD-10-CM | POA: Diagnosis not present

## 2021-11-22 DIAGNOSIS — R0902 Hypoxemia: Secondary | ICD-10-CM | POA: Diagnosis not present

## 2021-11-22 DIAGNOSIS — H6123 Impacted cerumen, bilateral: Secondary | ICD-10-CM | POA: Diagnosis not present

## 2021-11-22 DIAGNOSIS — K5090 Crohn's disease, unspecified, without complications: Secondary | ICD-10-CM | POA: Diagnosis not present

## 2021-11-22 DIAGNOSIS — I131 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, with stage 1 through stage 4 chronic kidney disease, or unspecified chronic kidney disease: Secondary | ICD-10-CM | POA: Diagnosis not present

## 2021-11-22 DIAGNOSIS — Z1339 Encounter for screening examination for other mental health and behavioral disorders: Secondary | ICD-10-CM | POA: Diagnosis not present

## 2021-11-22 DIAGNOSIS — Z0000 Encounter for general adult medical examination without abnormal findings: Secondary | ICD-10-CM | POA: Diagnosis not present

## 2021-11-22 DIAGNOSIS — I480 Paroxysmal atrial fibrillation: Secondary | ICD-10-CM | POA: Diagnosis not present

## 2021-11-29 NOTE — Progress Notes (Signed)
Primary Physician:  Donnajean Lopes, MD   Patient ID: Nicholas [name], male    DOB: 05-Feb-1941, 81 y.o.   MRN: [id number]  Chief Complaint  Patient presents with   Hypertension   Palpitations   Follow-up    6 month    HPI   Nicholas [name]  is a 81 y.o. male  with chronic dyspnea on exertion, sleep apnea (on CPAP), dyslipidemia, diet-controlled diabetes mellitus, Crohn's disease on Aziothioprine,  admitted to [facility] in January 2014 when he had reversal of ileostomy postoperatively on 11/12/2012 had wide complex tachycardia and also narrow complex tachycardia suggestive of PSVT and also A. Flutter with abberancy. He has diagnosis of paroxysmal atrial fibrillation by history in January 2005, he is only on ASA therapy per patient choice.   Patient presents for 52-monthfollow-up.  Last office visit patient was stable from a cardiovascular standpoint, therefore no changes were made.  I personally reviewed external labs, lipids are well controlled and diabetes is well controlled.  Overall patient is feeling well without specific complaints.  He does report he was diagnosed with shingles approximately 4 months ago and he does remain tender to palpation along his ribs.  He also states he has been undergoing biopsies and treatment for skin cancer, therefore has discontinued daily aspirin use.  He also has a biopsy spot on the top of his head which has prevented him from using CPAP at this time.  He is working with his PCP to come up with alternative treatment for underlying sleep apnea.  Denies chest pain, palpitations, dizziness, syncope, leg swelling, orthopnea.  He does have chronic dyspnea which remained stable.  Past Medical History:  Diagnosis Date   Anemia    PO IRON    Aortic stenosis, mild    Cataract    Colonic hemorrhage 11/10/1962   Crohn's disease (HCC)    GERD (gastroesophageal reflux disease)    Heart murmur    Hyperlipidemia    Macular degeneration    PAC (premature  atrial contraction)    PAF (paroxysmal atrial fibrillation) ([facility]    SEES DR. BRACKBILL   Palpitations    Squamous cell skin cancer    Past Surgical History:  Procedure Laterality Date   APPENDECTOMY     Biopsy Facial  05/10/2019   BLADDER REPAIR  1964   intestine leaks/bladder repair   CARDIOVASCULAR STRESS TEST  11/29/2003   EF 64%   CARDIOVASCULAR STRESS TEST  11/17/2012   No evidence of ischemia; EF 66%; inferior wall attenuation favored to rep diaphragmatic attenuation   CATARACT EXTRACTION W/ INTRAOCULAR LENS  IMPLANT, BILATERAL     2013   ILEOSTOMY  04/16/2012   Procedure: ILEOSTOMY;  Surgeon: HAdin Hector MD;  [place] [facility]  Service: General;  Laterality: N/A;   ILEOSTOMY CLOSURE  12/07/2012   ILEOSTOMY CLOSURE  12/07/2012   Procedure: ILEOSTOMY TAKEDOWN;  Surgeon: HAdin Hector MD;  [place] [facility]  Service: General;  Laterality: N/A;   LAPAROTOMY  04/16/2012   Procedure: EXPLORATORY LAPAROTOMY;  Surgeon: HAdin Hector MD;  [place] [facility]  Service: General;  Laterality: N/A;   LYSIS OF ADHESION  12/07/2012   Procedure: LYSIS OF ADHESION;  Surgeon: HAdin Hector MD;  [place] [facility]  Service: General;  Laterality: N/A;   PROCTOSCOPY  12/07/2012   Procedure: PROCTOSCOPY;  Surgeon: HAdin Hector MD;  [place] [facility]  Service: General;;  [place]  ulcer   TONSILLECTOMY     US ECHOCARDIOGRAPHY  05/20/2010   EF 55-60%   US ECHOCARDIOGRAPHY  10/23/2005   EF 55-60%   Family History  Problem Relation Age of Onset   Stroke Mother    Multiple myeloma Father    Heart disease Maternal Grandfather    Congestive Heart Failure Daughter    Heart disease Other        maternal side   Colon cancer Neg Hx    Social History   Tobacco Use   Smoking status: Former    Packs/day: 1.00    Years: 2.00    Pack years: 2.00    Types: Cigarettes    Quit date: 11/10/1964    Years since quitting: 57.0   Smokeless tobacco: Never   Substance Use Topics   Alcohol use: No    Marital Status: Married  ROS:   Review of Systems  Constitutional: Negative for weight gain.  Cardiovascular:  Positive for dyspnea on exertion (chronic, stable). Negative for chest pain, claudication, leg swelling, near-syncope, orthopnea, palpitations, paroxysmal nocturnal dyspnea and syncope.  Hematologic/Lymphatic: Does not bruise/bleed easily.  Gastrointestinal:  Negative for melena.  Neurological:  Negative for dizziness.   Objective:  Blood pressure 130/65, pulse 66, temperature 98 F (36.7 C), resp. rate 16, height _0  (1.753 m), weight 154 lb (69.9 kg), SpO2 98 %. Body mass index is 22.74 kg/m.  Vitals with BMI 12/02/2021 07/28/2021 05/31/2021  Height _1  - _2   Weight 154 lbs - 157 lbs 13 oz  BMI 19.14 - 78.29  Systolic 562 130 865  Diastolic 65 85 69  Pulse 66 89 60    Physical Exam Vitals reviewed.  Constitutional:      General: He is not in acute distress.    Appearance: He is well-developed.  Cardiovascular:     Rate and Rhythm: Normal rate and regular rhythm.     Pulses: Intact distal pulses.          Carotid pulses are 2+ on the right side and 2+ on the left side.      Radial pulses are 2+ on the right side and 2+ on the left side.       Dorsalis pedis pulses are 2+ on the right side and 2+ on the left side.       Posterior tibial pulses are 1+ on the right side and 1+ on the left side.     Heart sounds: Murmur heard.  Mid to late systolic murmur is present with a grade of 2/6 at the lower left sternal border.  Pulmonary:     Effort: Pulmonary effort is normal. No accessory muscle usage.     Breath sounds: Normal breath sounds.  Musculoskeletal:     Right lower leg: No edema.     Left lower leg: No edema.  Neurological:     Mental Status: He is alert.   Laboratory examination:   CMP Latest Ref Rng & Units 04/26/2020 02/13/2020 07/14/2016  Glucose 65 - 99 mg/dL 106(H) 100(H) 109(H)  BUN 8 - 27 mg/dL _3 Creatinine 0.76 - 1.27 mg/dL 1.27 1.32(H) 1.32(H)  Sodium 134 - 144 mmol/L 139 141 135  Potassium 3.5 - 5.2 mmol/L 4.5 4.5 4.0  Chloride 96 - 106 mmol/L 104 108 107  CO2 20 - 29 mmol/L 24 19(L) 24  Calcium 8.6 - 10.2 mg/dL 9.5 10.1 8.7(L)  Total Protein 6.0 - 8.3 g/dL - - -  Total Bilirubin 0.3 - 1.2 mg/dL - - -  Alkaline Phos 39 - 117 U/L - - -  AST 0 - 37 U/L - - -  ALT 0 - 53 U/L - - -   CBC Latest Ref Rng & Units 02/13/2020 07/14/2016 02/24/2015  WBC 4.0 - 10.5 K/uL 7.3 4.9 8.1  Hemoglobin 13.0 - 17.0 g/dL 14.9 13.9 12.3(L)  Hematocrit 39.0 - 52.0 % 45.0 42.5 36.8(L)  Platelets 150 - 400 K/uL 282 205 158   Lipid Panel     Component Value Date/Time   CHOL 60 04/19/2012 0411   TRIG 103 04/19/2012 0411   HEMOGLOBIN A1C No results found for: HGBA1C, MPG TSH No results for input(s): TSH in the last 8760 hours.  External labs: 11/15/2021: BUN 15, creatinine 1.3, GFR 53, sodium 143, potassium 4.8, Hgb 13.9, HCT 39.8, MCV 94.2, platelet 235 Total cholesterol 139, triglycerides 99, LDL 75, HDL 44 Lipoprotein B 69 (normal) TSH 4.04 A1c 5.1%  01/05/2020:  HbA1C 5.5% Albumin 4.2, Total Bilirubin 0.4, Bilirubin, Direct 0.1, Alkaline Phosphatase 57, AST 22, ALT 13, Total Protein 6.3.   12/28/2019:  BUN 20, Creatinine 1.21, Na/K 137/4.7, Glucose 167, eGFR 56,  Hemoglobin 13.4, MCV 97.9, RBC 4.04, WBC 7.7.   Cholesterol, total 139.000 m 09/26/2019 Triglycerides 111.000 09/26/2019 HDL 50 MG/DL 09/26/2019 LDL 67.000 mg 09/26/2019  A1C 5.300 % 09/26/2019; TSH 5.080 09/26/2019  Allergies  No Known Allergies   Medications Prior to Visit:   Outpatient Medications Prior to Visit  Medication Sig Dispense Refill   amLODipine (NORVASC) 2.5 MG tablet Take 2.5 mg by mouth daily.     azaTHIOprine (IMURAN) 50 MG tablet Take 1 tablet by mouth daily.     ergocalciferol (VITAMIN D2) 50000 UNITS capsule Take 50,000 Units by mouth once a week.      fenofibrate 160 MG tablet Take 160 mg by  mouth daily.     fluorouracil (EFUDEX) 5 % cream Apply topically 2 (two) times daily.     IRON PO Take 2 tablets by mouth daily. Iron 87m     levothyroxine (SYNTHROID) 25 MCG tablet Take 50 mcg by mouth daily before breakfast.     loperamide (IMODIUM) 2 MG capsule Take 2 mg by mouth daily as needed for diarrhea or loose stools.     losartan (COZAAR) 25 MG tablet TAKE 2 TABLETS (50 MG) BY MOUTH EVERY EVENING. 180 tablet 1   Methylcellulose, Laxative, (CITRUCEL PO) Take by mouth as needed.     metoprolol tartrate (LOPRESSOR) 25 MG tablet Take 25 mg by mouth 2 (two) times daily.     Multiple Vitamin (MULTI VITAMIN DAILY PO) Take 1 tablet by mouth daily.     Multiple Vitamins-Minerals (PRESERVISION AREDS 2) CAPS Take by mouth. 1 in the am 1 in the pm     testosterone cypionate (DEPOTESTOSTERONE CYPIONATE) 200 MG/ML injection Inject 1 mL into the muscle every 21 ( twenty-one) days.  5   ASPIRIN 81 PO Take by mouth. (Patient not taking: Reported on 12/02/2021)     azaTHIOprine (IMURAN) 50 MG tablet Take 50 mg by mouth daily.     No facility-administered medications prior to visit.   Final Medications at End of Visit    Current Meds  Medication Sig   amLODipine (NORVASC) 2.5 MG tablet Take 2.5 mg by mouth daily.   azaTHIOprine (IMURAN) 50 MG tablet Take 1 tablet by mouth daily.   ergocalciferol (VITAMIN D2) 50000 UNITS capsule Take 50,000 Units by mouth once  a week.    fenofibrate 160 MG tablet Take 160 mg by mouth daily.   fluorouracil (EFUDEX) 5 % cream Apply topically 2 (two) times daily.   IRON PO Take 2 tablets by mouth daily. Iron 47m   levothyroxine (SYNTHROID) 25 MCG tablet Take 50 mcg by mouth daily before breakfast.   loperamide (IMODIUM) 2 MG capsule Take 2 mg by mouth daily as needed for diarrhea or loose stools.   losartan (COZAAR) 25 MG tablet TAKE 2 TABLETS (50 MG) BY MOUTH EVERY EVENING.   Methylcellulose, Laxative, (CITRUCEL PO) Take by mouth as needed.   metoprolol tartrate  (LOPRESSOR) 25 MG tablet Take 25 mg by mouth 2 (two) times daily.   Multiple Vitamin (MULTI VITAMIN DAILY PO) Take 1 tablet by mouth daily.   Multiple Vitamins-Minerals (PRESERVISION AREDS 2) CAPS Take by mouth. 1 in the am 1 in the pm   testosterone cypionate (DEPOTESTOSTERONE CYPIONATE) 200 MG/ML injection Inject 1 mL into the muscle every 21 ( twenty-one) days.   Radiology:   Chest X-Ray 02/13/2020: No active cardiopulmonary disease.  MRI abdomen with and without contrast 03/07/2020: 1.  Biliary and pancreatic ductal dilatation with abrupt cutoff of the common bile duct proximal to the ampulla as described above. Subtle periampullary enhancement. Recommend correlation with ERCP, as underlying lesion could have this appearance. 2.  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 3.  Mild hepatic steatosis.  CT abdomen 01/02/2020: 1.  Status post subtotal colectomy with ileorectal anastomosis. No CT evidence of active disease. 2.  Mild biliary ductal fullness with smooth tapering toward the level of the ampulla, nonspecific. Correlate with biliary labs and ERCP if elevated.  3.  Sequelae of chronic pancreatitis with the pancreatic duct ectasia and punctate parenchymal calcifications. Subcentimeter cystic focus within the pancreatic head may reflect a side branch intraductal papillary mucinous neoplasm versus sequelae of pseudocyst. Consider 6-12 month follow-up with pancreas protocol MR.  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 Also had narrow complex tachy for 15 minutes that again is suspicious for A. Fl with 2:1 conduction vs atrial tachycardia vs PSVT.  Event Monitor 09/16/2017: Sinus rhythm, heart rate 37-126 bpm. symptoms of fatigue with sinus rhythm at 80 bpm. No atrial fibrillation, atrial flutter  Echocardiogram 02/28/2020:  Normal LV systolic function with visual EF 50-55%. Left ventricle  cavity is normal in size. Moderate left ventricular hypertrophy. Normal global wall motion. Normal diastolic filling pattern, normal LAP.  Structurally normal mitral valve. No evidence of mitral stenosis. Mild (Grade I) mitral regurgitation.  Structurally normal tricuspid valve. No evidence of tricuspid stenosis.  Mild tricuspid regurgitation.  Compared to previous study dated 09/28/2017 Mild MR and TR are new. LVEF remains stable.  Exercise tetrofosmin stress test  02/29/2020: Normal ECG stress. The baseline blood pressure was 172/90 mmHg and increased to 208/80 mmHg, which is a hypertensive response to exercise. The patient exercised for 4 minutes and 45 seconds of a Bruce protocol, achieving approximately 7 METs.  Myocardial perfusion is normal. Overall LV systolic function is normal without regional wall motion abnormalities. Stress LV EF: 71%.  Compared to 09/21/2017, no significant change.  EKG  12/02/2021: Sinus rhythm at a rate of 67 bpm.  Normal axis.  No evidence of ischemia or underlying injury pattern.  Unchanged compared to EKG 02/28/2021.  EKG 11/29/2020: Marked sinus bradycardia at rate of 46 bpm, otherwise normal EKG.  Compared to 02/17/2020, heart rate was 65 bpm  and on 11/30/2019, heart rate was 56 bpm.    Assessment:     ICD-10-CM   1. Paroxysmal atrial fibrillation (HCC)  I48.0 EKG 12-Lead    2. Primary hypertension  I10     3. Hypercholesteremia  E78.00     4. Diet-controlled diabetes mellitus ([place])  E11.9       No orders of the defined types were placed in this encounter.   Medications Discontinued During This Encounter  Medication Reason   azaTHIOprine (IMURAN) 50 MG tablet    This patients CHA2DS2-VASc Score 3 (DM, Age)and yearly risk of stroke 3.2%.   Recommendations:   Nicholas [name]  is a 81 y.o. 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 Flutter with abberancy. He has diagnosis of paroxysmal atrial fibrillation by history in January 2005, he is only on ASA therapy per patient choice and has not had any recurrence of palpitations.   Patient presents for 63-monthfollow-up.  Last office visit patient was stable from a cardiovascular standpoint, therefore no changes were made.  I personally reviewed external labs, lipids are well controlled and diabetes is well controlled.  Patient is doing well overall from a cardiovascular standpoint and is without specific complaints.  Blood pressure is well controlled.  EKG and physical exam are unchanged compared to previous.  Discussed at length with patient regarding history of atrial fibrillation and associated risk of stroke.  In the past patient has chosen to be on aspirin instead of anticoagulation.  However he is now not taking aspirin on a daily basis.  Again discussed indication, risk, benefits of anticoagulation as well as daily aspirin use.  Patient verbalized understanding of benefits and risks, however he continues to wish to avoid anticoagulation and daily aspirin use.  Patient states he is willing to take aspirin 81 mg every other day.  Follow-up in 6 months, sooner if needed.   CAlethia Berthold PA-C 12/02/2021, 3:31 PM Office: [phone]

## 2021-12-02 ENCOUNTER — Other Ambulatory Visit: Payer: Self-pay

## 2021-12-02 ENCOUNTER — Ambulatory Visit: Payer: PPO | Admitting: Student

## 2021-12-02 ENCOUNTER — Encounter: Payer: Self-pay | Admitting: Student

## 2021-12-02 VITALS — BP 130/65 | HR 66 | Temp 98.0°F | Resp 16 | Ht 69.0 in | Wt 154.0 lb

## 2021-12-02 DIAGNOSIS — E119 Type 2 diabetes mellitus without complications: Secondary | ICD-10-CM

## 2021-12-02 DIAGNOSIS — I10 Essential (primary) hypertension: Secondary | ICD-10-CM

## 2021-12-02 DIAGNOSIS — I480 Paroxysmal atrial fibrillation: Secondary | ICD-10-CM | POA: Diagnosis not present

## 2021-12-02 DIAGNOSIS — R0902 Hypoxemia: Secondary | ICD-10-CM | POA: Diagnosis not present

## 2021-12-02 DIAGNOSIS — E7800 Pure hypercholesterolemia, unspecified: Secondary | ICD-10-CM | POA: Diagnosis not present

## 2021-12-16 DIAGNOSIS — D492 Neoplasm of unspecified behavior of bone, soft tissue, and skin: Secondary | ICD-10-CM | POA: Diagnosis not present

## 2021-12-16 DIAGNOSIS — C4442 Squamous cell carcinoma of skin of scalp and neck: Secondary | ICD-10-CM | POA: Diagnosis not present

## 2021-12-16 DIAGNOSIS — L570 Actinic keratosis: Secondary | ICD-10-CM | POA: Diagnosis not present

## 2021-12-21 DIAGNOSIS — G4733 Obstructive sleep apnea (adult) (pediatric): Secondary | ICD-10-CM | POA: Diagnosis not present

## 2021-12-25 DIAGNOSIS — Z961 Presence of intraocular lens: Secondary | ICD-10-CM | POA: Diagnosis not present

## 2021-12-25 DIAGNOSIS — H353132 Nonexudative age-related macular degeneration, bilateral, intermediate dry stage: Secondary | ICD-10-CM | POA: Diagnosis not present

## 2021-12-25 DIAGNOSIS — H52203 Unspecified astigmatism, bilateral: Secondary | ICD-10-CM | POA: Diagnosis not present

## 2021-12-31 ENCOUNTER — Telehealth: Payer: Self-pay | Admitting: Neurology

## 2021-12-31 NOTE — Telephone Encounter (Signed)
Nicholas [name] [place]) verify overnight Oximetry or if you were sent the results. Would like a call from the nurse.

## 2021-12-31 NOTE — Telephone Encounter (Signed)
I called [facility] for Nicholas [name], Dr. Sharlett Iles nurse about this. [name] is in referral.

## 2022-01-01 NOTE — Telephone Encounter (Signed)
Nicholas [name] in MR said that [facility] called back and relayed that they had everything they needed.

## 2022-01-01 NOTE — Telephone Encounter (Signed)
I do not see where we received any results. I tried to call [facility] but they are not open yet.

## 2022-01-07 DIAGNOSIS — K50813 Crohn's disease of both small and large intestine with fistula: Secondary | ICD-10-CM | POA: Diagnosis not present

## 2022-01-07 DIAGNOSIS — H6123 Impacted cerumen, bilateral: Secondary | ICD-10-CM | POA: Diagnosis not present

## 2022-02-05 ENCOUNTER — Other Ambulatory Visit: Payer: Self-pay | Admitting: Student

## 2022-02-05 DIAGNOSIS — I10 Essential (primary) hypertension: Secondary | ICD-10-CM

## 2022-02-24 DIAGNOSIS — D485 Neoplasm of uncertain behavior of skin: Secondary | ICD-10-CM | POA: Diagnosis not present

## 2022-02-24 DIAGNOSIS — L570 Actinic keratosis: Secondary | ICD-10-CM | POA: Diagnosis not present

## 2022-02-24 DIAGNOSIS — D225 Melanocytic nevi of trunk: Secondary | ICD-10-CM | POA: Diagnosis not present

## 2022-02-24 DIAGNOSIS — Z08 Encounter for follow-up examination after completed treatment for malignant neoplasm: Secondary | ICD-10-CM | POA: Diagnosis not present

## 2022-02-24 DIAGNOSIS — L821 Other seborrheic keratosis: Secondary | ICD-10-CM | POA: Diagnosis not present

## 2022-02-24 DIAGNOSIS — C44319 Basal cell carcinoma of skin of other parts of face: Secondary | ICD-10-CM | POA: Diagnosis not present

## 2022-02-24 DIAGNOSIS — L814 Other melanin hyperpigmentation: Secondary | ICD-10-CM | POA: Diagnosis not present

## 2022-02-24 DIAGNOSIS — Z85828 Personal history of other malignant neoplasm of skin: Secondary | ICD-10-CM | POA: Diagnosis not present

## 2022-02-25 ENCOUNTER — Other Ambulatory Visit (HOSPITAL_COMMUNITY): Payer: Self-pay

## 2022-02-25 MED ORDER — ZOSTER VAC RECOMB ADJUVANTED 50 MCG/0.5ML IM SUSR
0.5000 mL | INTRAMUSCULAR | 1 refills | Status: DC
  Filled 2022-02-25: qty 0.5, 1d supply, fill #0
  Filled 2022-04-28 – 2022-05-14 (×3): qty 0.5, 1d supply, fill #1

## 2022-03-03 ENCOUNTER — Telehealth: Payer: Self-pay | Admitting: Neurology

## 2022-03-03 NOTE — Telephone Encounter (Signed)
I called pt and no answer.

## 2022-03-03 NOTE — Telephone Encounter (Signed)
Pt said, want to know the results from the wearing the oximetry overnight. Would like a call from the nurse. ?

## 2022-03-03 NOTE — Telephone Encounter (Signed)
I called pt since I could not find any test ONO that we ordered for this pt.  Last seen 05-2021.  He has problem with skin cancers on his head and not being able to were cpap due to this (healing). (Multiple).  He does not feel he can wear cpap due to this. He is questioning whether may be candidate for inspire. 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 I relayed will be glad to check with Dr. Rexene Alberts and call back to schedule appt. His last DL in [place] (date 08/2021), will need to bring in machine or card if needed). ?

## 2022-03-03 NOTE — Telephone Encounter (Signed)
Unfortunately, he is not a candidate for inspire due to the central apneas.  I think he canceled a follow-up appointment with the nurse practitioner, I would recommend he go back to using his CPAP when he can and make a follow-up appointment with the nurse practitioner at his convenience.  I did not order a oximetry test for him. ?

## 2022-03-04 NOTE — Telephone Encounter (Signed)
Spoke with patient and discussed message from Dr Rexene Alberts. Pt verbalized understanding and he agreed to use his CPAP. He scheduled a follow-up with Megan NP on 6/29 @ 8:30 AM. Pt aware to bring machine & power cord. He verbalized appreciation for the call.  ?

## 2022-03-18 ENCOUNTER — Emergency Department (HOSPITAL_COMMUNITY)
Admission: EM | Admit: 2022-03-18 | Discharge: 2022-03-18 | Disposition: A | Discharge status: Home / Self Care | Payer: PPO | Attending: Emergency Medicine | Admitting: Emergency Medicine

## 2022-03-18 ENCOUNTER — Emergency Department (HOSPITAL_COMMUNITY): Payer: PPO

## 2022-03-18 ENCOUNTER — Encounter (HOSPITAL_COMMUNITY): Payer: Self-pay

## 2022-03-18 ENCOUNTER — Other Ambulatory Visit: Payer: Self-pay

## 2022-03-18 DIAGNOSIS — Z7982 Long term (current) use of aspirin: Secondary | ICD-10-CM | POA: Diagnosis not present

## 2022-03-18 DIAGNOSIS — R011 Cardiac murmur, unspecified: Secondary | ICD-10-CM | POA: Insufficient documentation

## 2022-03-18 DIAGNOSIS — R0602 Shortness of breath: Secondary | ICD-10-CM | POA: Insufficient documentation

## 2022-03-18 DIAGNOSIS — R001 Bradycardia, unspecified: Secondary | ICD-10-CM | POA: Insufficient documentation

## 2022-03-18 DIAGNOSIS — I10 Essential (primary) hypertension: Secondary | ICD-10-CM | POA: Diagnosis not present

## 2022-03-18 DIAGNOSIS — R0689 Other abnormalities of breathing: Secondary | ICD-10-CM | POA: Diagnosis not present

## 2022-03-18 LAB — TROPONIN I (HIGH SENSITIVITY): Troponin I (High Sensitivity): 6 ng/L (ref <18)

## 2022-03-18 LAB — BASIC METABOLIC PANEL
Anion gap: 7 (ref 5–15)
BUN: 17 mg/dL (ref 8–23)
CO2: 21 mmol/L — ABNORMAL LOW (ref 22–32)
Calcium: 9.3 mg/dL (ref 8.9–10.3)
Chloride: 110 mmol/L (ref 98–111)
Creatinine, Ser: 1.25 mg/dL — ABNORMAL HIGH (ref 0.61–1.24)
GFR, Estimated: 57 mL/min — ABNORMAL LOW (ref >60)
Glucose, Bld: 96 mg/dL (ref 70–99)
Potassium: 4.2 mmol/L (ref 3.5–5.1)
Sodium: 138 mmol/L (ref 135–145)

## 2022-03-18 LAB — CBC
HCT: 34.1 % — ABNORMAL LOW (ref 39.0–52.0)
Hemoglobin: 11.9 g/dL — ABNORMAL LOW (ref 13.0–17.0)
MCH: 34.2 pg — ABNORMAL HIGH (ref 26.0–34.0)
MCHC: 34.9 g/dL (ref 30.0–36.0)
MCV: 98 fL (ref 80.0–100.0)
Platelets: 222 10*3/uL (ref 150–400)
RBC: 3.48 MIL/uL — ABNORMAL LOW (ref 4.22–5.81)
RDW: 13 % (ref 11.5–15.5)
WBC: 5.2 10*3/uL (ref 4.0–10.5)
nRBC: 0 % (ref 0.0–0.2)

## 2022-03-18 LAB — BRAIN NATRIURETIC PEPTIDE: B Natriuretic Peptide: 60.9 pg/mL (ref 0.0–100.0)

## 2022-03-18 MED ORDER — ALBUTEROL SULFATE HFA 108 (90 BASE) MCG/ACT IN AERS: 2.0000 | INHALATION_SPRAY | RESPIRATORY_TRACT | Status: DC | PRN

## 2022-03-18 NOTE — ED Provider Notes (Signed)
?[place] ?Provider Note ? ? ?CSN: [id number] ?Arrival date & time: 03/18/22  1128 ? ?  ? ?History ? ?Chief Complaint  ?Patient presents with  ? Shortness of Breath  ? ? ?Nicholas [name] is a 81 y.o. male.  Patient presents to the hospital via EMS concerns of shortness of breath.  Patient states that he has had shortness of breath intermittently with exertion over the past 1+ years.  Patient has been evaluated by cardiology in the past with exercise stress test.  He states that at his exercise stress test he had an occurrence of the shortness of breath and was evaluated by cardiology who found nothing at the time.  Patient states that this morning he was walking from his house down his front porch and back to the house multiple times, he became severely short of breath.  Patient states that he checked his oxygen saturation at home and it was 96% or better.  EMS was called and found SPO2 of 95+ percent but gave the patient 3 L/min oxygen via nasal cannula for symptom relief.  Patient states that the oxygen seem to do nothing for his symptoms.  Patient endorses shortness of breath, denies chest pain, nausea, vomiting, fever, cough.  Past medical history significant for PACs, paroxysmal A-fib, GERD, heart murmur, palpitations, Crohn's disease, aortic stenosis, hyperlipidemia ? ?HPI ? ?  ? ?Home Medications ?Prior to Admission medications   ?Medication Sig Start Date End Date Taking? Authorizing Provider  ?amLODipine (NORVASC) 2.5 MG tablet Take 2.5 mg by mouth daily. 07/17/20   [provider]  ?ASPIRIN 81 PO Take by mouth. ?Patient not taking: Reported on 12/02/2021    [provider]  ?azaTHIOprine (IMURAN) 50 MG tablet Take 1 tablet by mouth daily. 10/07/21   [provider]  ?ergocalciferol (VITAMIN D2) 50000 UNITS capsule Take 50,000 Units by mouth once a week.     [provider]  ?fenofibrate 160 MG tablet Take 160 mg by mouth daily.     [provider]  ?fluorouracil (EFUDEX) 5 % cream Apply topically 2 (two) times daily. 05/22/21   [provider]  ?IRON PO Take 2 tablets by mouth daily. Iron '65mg'$     [provider]  ?levothyroxine (SYNTHROID) 25 MCG tablet Take 50 mcg by mouth daily before breakfast.    [provider]  ?loperamide (IMODIUM) 2 MG capsule Take 2 mg by mouth daily as needed for diarrhea or loose stools.    [provider]  ?losartan (COZAAR) 25 MG tablet TAKE 2 TABLETS (50 MG) BY MOUTH EVERY EVENING. 02/05/22   Cantwell, Celeste C, PA-C  ?Methylcellulose, Laxative, (CITRUCEL PO) Take by mouth as needed.    [provider]  ?metoprolol tartrate (LOPRESSOR) 25 MG tablet Take 25 mg by mouth 2 (two) times daily.    [provider]  ?Multiple Vitamin (MULTI VITAMIN DAILY PO) Take 1 tablet by mouth daily.    [provider]  ?Multiple Vitamins-Minerals (PRESERVISION AREDS 2) CAPS Take by mouth. 1 in the am 1 in the pm    Adrian Prows, MD  ?testosterone cypionate (DEPOTESTOSTERONE CYPIONATE) 200 MG/ML injection Inject 1 mL into the muscle every 21 ( twenty-one) days. 10/10/15   [provider]  ?Zoster Vaccine Adjuvanted [facility]) injection Inject 0.5 mLs into the muscle. 02/25/22   Carlyle Basques, MD  ?   ? ?Allergies    ?Patient has no known allergies.   ? ?Review of Systems   ?  Review of Systems  ?Constitutional:  Negative for fever.  ?Respiratory:  Positive for shortness of breath. Negative for cough, chest tightness and wheezing.   ?Cardiovascular:  Negative for chest pain.  ?Gastrointestinal:  Negative for abdominal pain and nausea.  ?Skin:  Negative for pallor.  ?Neurological:  Negative for syncope.  ? ?Physical Exam ?Updated Vital Signs ?BP (!) 150/75   Pulse (!) 54   Temp 99 ?F (37.2 ?C) (Oral)   Resp 20   Ht '5\' 9"'$  (1.753 m)   Wt 70.3 kg   SpO2 98%   BMI 22.89 kg/m?  ?Physical Exam ?Vitals and nursing note reviewed.  ?Constitutional:   ?    General: He is not in acute distress. ?HENT:  ?   Head: Normocephalic and atraumatic.  ?   Mouth/Throat:  ?   Mouth: Mucous membranes are moist.  ?Eyes:  ?   Extraocular Movements: Extraocular movements intact.  ?Cardiovascular:  ?   Rate and Rhythm: Regular rhythm. Bradycardia present.  ?   Pulses: Normal pulses.  ?   Heart sounds: Murmur heard.  ?Pulmonary:  ?   Effort: Pulmonary effort is normal.  ?   Breath sounds: Normal breath sounds. No decreased breath sounds, wheezing, rhonchi or rales.  ?Chest:  ?   Chest wall: No tenderness.  ?Musculoskeletal:  ?   Cervical back: Normal range of motion and neck supple.  ?Skin: ?   General: Skin is warm and dry.  ?   Coloration: Skin is not cyanotic.  ?Neurological:  ?   Mental Status: He is alert and oriented to person, place, and time.  ? ? ?ED Results / Procedures / Treatments   ?Labs ?(all labs ordered are listed, but only abnormal results are displayed) ?Labs Reviewed  ?BASIC METABOLIC PANEL - Abnormal; Notable for the following components:  ?    Result Value  ? CO2 21 (*)   ? Creatinine, Ser 1.25 (*)   ? GFR, Estimated 57 (*)   ? All other components within normal limits  ?CBC - Abnormal; Notable for the following components:  ? RBC 3.48 (*)   ? Hemoglobin 11.9 (*)   ? HCT 34.1 (*)   ? MCH 34.2 (*)   ? All other components within normal limits  ?BRAIN NATRIURETIC PEPTIDE  ?TROPONIN I (HIGH SENSITIVITY)  ? ? ?EKG ?None ? ?Radiology ?DG Chest 2 View ? ?Result Date: 03/18/2022 ?CLINICAL DATA:  Shortness of breath EXAM: CHEST - 2 VIEW COMPARISON:  None Available. FINDINGS: Cardiac and mediastinal contours are within normal limits. No focal pulmonary opacity. No pleural effusion or pneumothorax. No acute osseous abnormality. IMPRESSION: No acute cardiopulmonary process. Electronically Signed   By: Merilyn Baba M.D.   On: 03/18/2022 12:45   ? ?Procedures ?Procedures  ? ?Medications Ordered in ED ?Medications  ?albuterol (VENTOLIN HFA) 108 (90 Base) MCG/ACT inhaler 2 puff  (has no administration in time range)  ? ? ?ED Course/ Medical Decision Making/ A&P ?  ?                        ?Medical Decision Making ?Amount and/or Complexity of Data Reviewed ?Labs: ordered. ?Radiology: ordered. ? ?Risk ?Prescription drug management. ? ? ?This patient presents to the ED for concern of shortness of breath, this involves an extensive number of treatment options, and is a complaint that carries with it a high risk of complications and morbidity.  The differential diagnosis includes respiratory infection, pneumonia, ACS, PE, CHF,  and others ? ? ?Co morbidities that complicate the patient evaluation ? ?History of shortness of breath, A-fib ? ? ?Additional history obtained: ? ?Additional history obtained from patient's wife ?External records from outside source obtained and reviewed including cardiology note from January 2023 showing chronic dyspnea ? ? ?Lab Tests: ? ?I Ordered, and personally interpreted labs.  The pertinent results include: BNP 60.9, hemoglobin 11.9, troponin 6 ? ? ?Imaging Studies ordered: ? ?I ordered imaging studies including chest x-ray ?I independently visualized and interpreted imaging which showed no acute process ?I agree with the radiologist interpretation ? ? ?Cardiac Monitoring: / EKG: ? ?The patient was maintained on a cardiac monitor.  I personally viewed and interpreted the cardiac monitored which showed an underlying rhythm of: Sinus rhythm ? ? ?Test / Admission - Considered: ? ?Lung fields are clear, chest x-ray showing no sign of pneumonia.  Patient has no cough, no fever, no sign of upper respiratory illness.  EKG and initial troponin show no ischemic changes, no elevation.  Patient is not having chest pain.  ACS highly unlikely.  BNP showing no signs of atrial stretch.  No pedal edema.  No concerns of CHF. ? ?The patient does have history of chronic dyspnea.  This seems to be worsening, but no emergent source has been identified today.  I did consider CT for PE  study but the patient has a bradycardic heart rate, no chest pain, PE very low on differential. ? ?I believe the patient's best course at this time will be to follow-up with cardiology.  He has chronic dyspnea and this se

## 2022-03-18 NOTE — ED Triage Notes (Signed)
Pt bib [facility] from home with complaints of increased shob after exertion. Pt states he has had this problem for a year but it has gotten worse over the past week. Pt arrivers on [facility] for comfort. ?EMS vitals: 138/72, 60HR NSR ?

## 2022-03-18 NOTE — Discharge Instructions (Addendum)
You were seen today for shortness of breath.  There were no signs of pneumonia on chest x-ray or on your physical exam.  There are no signs of heart damage on your EKG or with your lab work.  Your lab work was grossly normal.  At this time we see no emergent cause of your shortness of breath.  As discussed, recommend follow-up with your cardiologist office for further evaluation.  Return to the hospital if you develop chest pain, repeat severe shortness of breath, or other life-threatening conditions ?

## 2022-03-27 DIAGNOSIS — G4733 Obstructive sleep apnea (adult) (pediatric): Secondary | ICD-10-CM | POA: Diagnosis not present

## 2022-03-31 ENCOUNTER — Encounter: Payer: Self-pay | Admitting: Cardiology

## 2022-03-31 ENCOUNTER — Ambulatory Visit: Payer: PPO | Admitting: Cardiology

## 2022-03-31 VITALS — BP 119/74 | HR 60 | Temp 97.7°F | Resp 16 | Ht 69.0 in | Wt 151.0 lb

## 2022-03-31 DIAGNOSIS — G4733 Obstructive sleep apnea (adult) (pediatric): Secondary | ICD-10-CM | POA: Diagnosis not present

## 2022-03-31 DIAGNOSIS — Z9989 Dependence on other enabling machines and devices: Secondary | ICD-10-CM | POA: Diagnosis not present

## 2022-03-31 DIAGNOSIS — I10 Essential (primary) hypertension: Secondary | ICD-10-CM

## 2022-03-31 DIAGNOSIS — R0609 Other forms of dyspnea: Secondary | ICD-10-CM

## 2022-03-31 NOTE — Progress Notes (Signed)
Primary Physician:  Donnajean Lopes, MD   Patient ID: Nicholas [name], male    DOB: 1940-12-12, 81 y.o.   MRN: [id number]  Chief Complaint  Patient presents with   Hospitalization Follow-up    2 weeks   Shortness of Breath    HPI   Nicholas [name]  is a 81 y.o. 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  He is only on ASA therapy per patient choice.   Patient was seen by [place] 6 months ago and he was essentially asymptomatic.  However he had an episode of shingles in September 2022, since then he has noticed decreased exercise tolerance and now he presents as an acute visit for marked dyspnea on exertion even doing minimal activities around the house.  No PND or orthopnea, no leg edema.  Denies any chest pain.     Past Medical History:  Diagnosis Date   Anemia    PO IRON    Aortic stenosis, mild    Cataract    Colonic hemorrhage 11/10/1962   Crohn's disease (HCC)    GERD (gastroesophageal reflux disease)    Heart murmur    Hyperlipidemia    Macular degeneration    PAC (premature atrial contraction)    Palpitations    Sleep apnea, obstructive 08/28/2020   on CPAP   Squamous cell skin cancer     Family History  Problem Relation Age of Onset   Stroke Mother    Multiple myeloma Father    Heart disease Maternal Grandfather    Congestive Heart Failure Daughter    Heart disease Other        maternal side   Colon cancer Neg Hx    Social History   Tobacco Use   Smoking status: Former    Packs/day: 1.00    Years: 2.00    Pack years: 2.00    Types: Cigarettes    Quit date: 11/10/1964    Years since quitting: 57.4   Smokeless tobacco: Never  Substance Use Topics   Alcohol use: No    Marital Status: Married   ROS:   Review of Systems  Cardiovascular:  Positive for dyspnea on exertion. Negative for chest pain and leg swelling.   Objective:  Blood pressure 119/74, pulse 60, temperature 97.7 F (36.5 C), temperature source Temporal, resp. rate 16, height '5\' 9"'  (1.753 m), weight 151 lb (68.5 kg), SpO2 99 %. Body mass index is 22.3 kg/m.     03/31/2022   12:08 PM 03/18/2022    2:30 PM 03/18/2022    2:15 PM  Vitals with BMI  Height '5\' 9"'     Weight 151 lbs    BMI 03.70    Systolic 488 891 694  Diastolic 74 83 75  Pulse 60 53 54    Physical Exam Constitutional:      Appearance: He is well-developed.  Neck:     Vascular: No carotid bruit or JVD.  Cardiovascular:     Rate and Rhythm: Normal rate and regular rhythm.     Pulses: Normal pulses and intact distal pulses.     Heart sounds: Murmur heard.  Mid to late systolic murmur is present with a grade of 2/6 at the lower left sternal border.  Pulmonary:     Effort: Pulmonary effort is normal. No accessory muscle usage.     Breath sounds: Normal breath sounds.  Abdominal:     General: Bowel sounds are normal.     Palpations: Abdomen is soft.  Musculoskeletal:     Right lower leg: No edema.     Left lower leg: No edema.   Laboratory examination:      Latest Ref Rng & Units 03/18/2022   12:02 PM 04/26/2020    3:06 PM 02/13/2020   12:43 PM  CMP  Glucose 70 - 99 mg/dL 96   106   100    BUN 8 - 23 mg/dL '17   13   19    ' Creatinine 0.61 - 1.24 mg/dL 1.25   1.27   1.32    Sodium 135 - 145 mmol/L 138   139   141    Potassium 3.5 - 5.1 mmol/L 4.2   4.5   4.5    Chloride 98 - 111 mmol/L 110   104   108    CO2 22 - 32 mmol/L '21   24   19    ' Calcium 8.9 - 10.3 mg/dL 9.3   9.5   10.1        Latest Ref Rng & Units 03/18/2022   12:02 PM 02/13/2020   12:43 PM 07/14/2016    3:39 AM  CBC  WBC 4.0 - 10.5 K/uL 5.2   7.3   4.9    Hemoglobin 13.0 - 17.0 g/dL 11.9   14.9   13.9    Hematocrit 39.0 - 52.0 % 34.1   45.0   42.5    Platelets 150 - 400 K/uL  222   282   205     Lipid Panel     Component Value Date/Time   CHOL 60 04/19/2012 0411   TRIG 103 04/19/2012 0411   HEMOGLOBIN A1C No results found for: HGBA1C, MPG TSH No results for input(s): TSH in the last 8760 hours.  External labs:  Cholesterol, total 139.000 m 11/15/2021 HDL 44.000 mg 11/15/2021 LDL 75.000 mg 11/15/2021 Triglycerides 99.000 mg 11/15/2021  A1C 5.100 % 11/15/2021 TSH 4.040 11/15/2021  Allergies  No Known Allergies   Final Medications at End of Visit     Current Outpatient Medications:    amLODipine (NORVASC) 2.5 MG tablet, Take 2.5 mg by mouth daily., Disp: , Rfl:    ASPIRIN 81 PO, Take by mouth., Disp: , Rfl:    azaTHIOprine (IMURAN) 50 MG tablet, Take 1 tablet by mouth daily., Disp: , Rfl:    ergocalciferol (VITAMIN D2) 50000 UNITS capsule, Take 50,000 Units by mouth once a week. , Disp: , Rfl:    fenofibrate 160 MG tablet, Take 160 mg by mouth daily., Disp: , Rfl:    IRON PO, Take 2 tablets by mouth daily. Iron 77m, Disp: , Rfl:    levothyroxine (SYNTHROID) 25 MCG tablet, Take 50 mcg by mouth daily before breakfast., Disp: , Rfl:    loperamide (IMODIUM) 2 MG capsule, Take 2 mg by mouth daily as needed for diarrhea or loose stools., Disp: , Rfl:    losartan (COZAAR) 25 MG tablet, TAKE 2 TABLETS (50 MG) BY MOUTH EVERY EVENING., Disp: 180 tablet, Rfl: 1   Methylcellulose, Laxative, (CITRUCEL PO), Take by mouth as needed., Disp: , Rfl:    Multiple Vitamin (MULTI VITAMIN DAILY PO), Take 1 tablet by mouth daily., Disp: , Rfl:    Multiple Vitamins-Minerals (PRESERVISION AREDS  2) CAPS, Take by mouth. 1 in the am 1 in the pm, Disp: , Rfl:   Radiology:   MRI abdomen with and without contrast 03/07/2020: 1.  Biliary and pancreatic ductal dilatation with abrupt cutoff of the common bile duct proximal to the ampulla as described above. Subtle periampullary enhancement. Recommend correlation with ERCP, as underlying lesion could have this appearance. 2.  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 3.  Mild hepatic steatosis.  CT abdomen 01/02/2020: 1.  Status post subtotal colectomy with ileorectal anastomosis. No CT evidence of active disease. 2.  Mild biliary ductal fullness with smooth tapering toward the level of the ampulla, nonspecific. Correlate with biliary labs and ERCP if elevated.  3.  Sequelae of chronic pancreatitis with the pancreatic duct ectasia and punctate parenchymal calcifications. Subcentimeter cystic focus within the pancreatic head may reflect a side branch intraductal papillary mucinous neoplasm versus sequelae of pseudocyst. Consider 6-12 month follow-up with pancreas protocol MR.  Chest x-ray PA and lateral view 03/18/2022: Cardiac and mediastinal contours are within normal limits. No focal pulmonary opacity. No pleural effusion or pneumothorax. No acute osseous abnormality.  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 Also had narrow complex tachy for 15 minutes that again is suspicious for A. Fl with 2:1 conduction vs atrial tachycardia vs PSVT.  Event Monitor 09/16/2017: Sinus rhythm, heart rate 37-126 bpm. symptoms of fatigue with sinus rhythm at 80 bpm. No atrial fibrillation, atrial flutter  Echocardiogram 02/28/2020:  Normal LV systolic function with visual EF 50-55%. Left ventricle cavity is normal in size. Moderate left ventricular hypertrophy. Normal global wall motion. Normal diastolic filling pattern, normal LAP.  Structurally normal mitral valve. No evidence of mitral stenosis. Mild (Grade I) mitral regurgitation.  Structurally normal tricuspid valve. No evidence of tricuspid stenosis.  Mild tricuspid regurgitation.  Compared to previous study dated 09/28/2017 Mild MR and TR are new. LVEF remains stable.  Exercise tetrofosmin stress test  02/29/2020: Normal ECG stress. The baseline blood pressure was 172/90 mmHg and increased to  208/80 mmHg, which is a hypertensive response to exercise. The patient exercised for 4 minutes and 45 seconds of a Bruce protocol, achieving approximately 7 METs.  Patient achieved 98% of MPHR. Myocardial perfusion is normal. Overall LV systolic function is normal without regional wall motion abnormalities. Stress LV EF: 71%.  Compared to 09/21/2017, no significant change.  EKG   EKG 03/18/2022: Normal sinus rhythm at rate of 55 bpm, normal axis, early R wave progression in V2 probably normal variant, no evidence of ischemia, normal EKG.  No significant change from 12/02/2021.  EKG 11/29/2020: Marked sinus bradycardia at rate of 46 bpm, otherwise normal EKG.  Compared to 02/17/2020, heart rate was 65 bpm and on 11/30/2019, heart rate was 56 bpm.    Assessment:     ICD-10-CM   1. Dyspnea on exertion  R06.09 PCV ECHOCARDIOGRAM COMPLETE    PCV CARDIAC STRESS TEST    2. Primary hypertension  I10     3. OSA on CPAP  G47.33    Z99.89       No orders of the defined types were placed in this encounter.   Medications Discontinued During This Encounter  Medication Reason   metoprolol tartrate (LOPRESSOR) 25 MG tablet    testosterone cypionate (DEPOTESTOSTERONE CYPIONATE) 200 MG/ML injection    Zoster Vaccine Adjuvanted [facility]) injection Completed Course   fluorouracil (EFUDEX) 5 % cream    This  patients CHA2DS2-VASc Score 3 (DM, Age)and yearly risk of stroke 3.2%.   Recommendations:   Nicholas [name]  is a 81 y.o.  with chronic dyspnea on exertion, sleep apnea (on CPAP), dyslipidemia, primary hypertension, stage IIIa chronic kidney disease, Crohn's disease on Aziothioprine, 1 episode of postop SVT/?  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  He is only on ASA therapy per patient choice.   Patient was seen by [place] 6 months ago and he was essentially asymptomatic.  However he had an episode of shingles in September 2022,  since then he has noticed decreased exercise tolerance and now he presents as an acute visit for marked dyspnea on exertion even doing minimal activities around the house.  No PND or orthopnea, no leg edema.  Denies any chest pain.  I do not know the etiology for his marked dyspnea on exertion.  No clinical evidence of heart failure.  His BNP and chest x-ray were normal in the hospital and EKG did not reveal any significant bradycardia or arrhythmias.  I am beginning to wonder whether he has acutely deconditioned or his dyspnea may be malaise and fatigue related to azathioprine therapy.  I will set him up for a routine treadmill exercise stress test to evaluate his exercise capacity and also an echocardiogram to evaluate his LV systolic function.  If these are normal, we could consider either a pulmonary referral or referral to pulmonary rehab.  If symptoms persist, close monitoring of his azathioprine therapy may be indicated.  Follow-up in 6 weeks, sooner if needed.  I reviewed his external labs, lipids and excellent control, stage III kidney disease stable, CBC is also stable with mild anemia again may be related to azathioprine therapy.  I reviewed his EKG, normal sinus rhythm.  This was a 40-minute office visit encounter.  If no cardiac etiology is found, we will see him back on a as needed basis.   Adrian Prows, PA-C 03/31/2022, 12:12 PM Office: [phone]

## 2022-04-01 DIAGNOSIS — K50813 Crohn's disease of both small and large intestine with fistula: Secondary | ICD-10-CM | POA: Diagnosis not present

## 2022-04-01 DIAGNOSIS — D490 Neoplasm of unspecified behavior of digestive system: Secondary | ICD-10-CM | POA: Diagnosis not present

## 2022-04-01 DIAGNOSIS — R933 Abnormal findings on diagnostic imaging of other parts of digestive tract: Secondary | ICD-10-CM | POA: Diagnosis not present

## 2022-04-01 DIAGNOSIS — Z9049 Acquired absence of other specified parts of digestive tract: Secondary | ICD-10-CM | POA: Diagnosis not present

## 2022-04-01 DIAGNOSIS — K862 Cyst of pancreas: Secondary | ICD-10-CM | POA: Diagnosis not present

## 2022-04-01 DIAGNOSIS — K838 Other specified diseases of biliary tract: Secondary | ICD-10-CM | POA: Diagnosis not present

## 2022-04-08 ENCOUNTER — Other Ambulatory Visit: Payer: PPO

## 2022-04-10 ENCOUNTER — Ambulatory Visit: Payer: PPO

## 2022-04-10 DIAGNOSIS — R0609 Other forms of dyspnea: Secondary | ICD-10-CM

## 2022-04-11 DIAGNOSIS — Z961 Presence of intraocular lens: Secondary | ICD-10-CM | POA: Diagnosis not present

## 2022-04-11 DIAGNOSIS — H353132 Nonexudative age-related macular degeneration, bilateral, intermediate dry stage: Secondary | ICD-10-CM | POA: Diagnosis not present

## 2022-04-11 DIAGNOSIS — H532 Diplopia: Secondary | ICD-10-CM | POA: Diagnosis not present

## 2022-04-18 ENCOUNTER — Other Ambulatory Visit: Payer: Self-pay | Admitting: Ophthalmology

## 2022-04-18 DIAGNOSIS — E291 Testicular hypofunction: Secondary | ICD-10-CM | POA: Diagnosis not present

## 2022-04-18 DIAGNOSIS — R5383 Other fatigue: Secondary | ICD-10-CM | POA: Diagnosis not present

## 2022-04-18 DIAGNOSIS — D84821 Immunodeficiency due to drugs: Secondary | ICD-10-CM | POA: Diagnosis not present

## 2022-04-18 DIAGNOSIS — H532 Diplopia: Secondary | ICD-10-CM

## 2022-04-18 DIAGNOSIS — E039 Hypothyroidism, unspecified: Secondary | ICD-10-CM | POA: Diagnosis not present

## 2022-04-18 DIAGNOSIS — I131 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, with stage 1 through stage 4 chronic kidney disease, or unspecified chronic kidney disease: Secondary | ICD-10-CM | POA: Diagnosis not present

## 2022-04-18 DIAGNOSIS — I480 Paroxysmal atrial fibrillation: Secondary | ICD-10-CM | POA: Diagnosis not present

## 2022-04-18 DIAGNOSIS — G4733 Obstructive sleep apnea (adult) (pediatric): Secondary | ICD-10-CM | POA: Diagnosis not present

## 2022-04-18 DIAGNOSIS — N1831 Chronic kidney disease, stage 3a: Secondary | ICD-10-CM | POA: Diagnosis not present

## 2022-04-18 DIAGNOSIS — R0602 Shortness of breath: Secondary | ICD-10-CM | POA: Diagnosis not present

## 2022-04-22 ENCOUNTER — Ambulatory Visit
Admission: RE | Admit: 2022-04-22 | Discharge: 2022-04-22 | Disposition: A | Discharge status: Home / Self Care | Payer: PPO | Source: Ambulatory Visit | Attending: Ophthalmology | Admitting: Ophthalmology

## 2022-04-22 DIAGNOSIS — H532 Diplopia: Secondary | ICD-10-CM | POA: Diagnosis not present

## 2022-04-22 DIAGNOSIS — H9190 Unspecified hearing loss, unspecified ear: Secondary | ICD-10-CM | POA: Diagnosis not present

## 2022-04-22 DIAGNOSIS — I6782 Cerebral ischemia: Secondary | ICD-10-CM | POA: Diagnosis not present

## 2022-04-22 MED ORDER — GADOBENATE DIMEGLUMINE 529 MG/ML IV SOLN
15.0000 mL | Freq: Once | INTRAVENOUS | Status: AC | PRN
  Administered 2022-04-22: 15 mL via INTRAVENOUS

## 2022-04-23 ENCOUNTER — Other Ambulatory Visit: Payer: PPO

## 2022-04-24 DIAGNOSIS — C44319 Basal cell carcinoma of skin of other parts of face: Secondary | ICD-10-CM | POA: Diagnosis not present

## 2022-04-28 ENCOUNTER — Other Ambulatory Visit (HOSPITAL_COMMUNITY): Payer: Self-pay

## 2022-04-30 DIAGNOSIS — K50813 Crohn's disease of both small and large intestine with fistula: Secondary | ICD-10-CM | POA: Diagnosis not present

## 2022-05-02 ENCOUNTER — Ambulatory Visit: Payer: PPO

## 2022-05-02 DIAGNOSIS — R0609 Other forms of dyspnea: Secondary | ICD-10-CM | POA: Diagnosis not present

## 2022-05-07 NOTE — Progress Notes (Unsigned)
PATIENT: Nicholas [name] DOB: 07-14-1941  REASON FOR VISIT: follow up HISTORY FROM: patient PRIMARY NEUROLOGIST: Dr. Frances Furbish  Chief Complaint  Patient presents with   Follow-up    Pt reports getting tired really quick. No questions or concerns. 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  He returns today for follow-up.  He reports that the CPAP is working fairly well for him.  At the last visit his pressure was reduced to 11 and his AHI has decreased.  He still reports daytime fatigue but has been discussing this with his PCP.  His download is below   REVIEW OF SYSTEMS: Out of a complete 14 system review of symptoms, the patient complains only of the following symptoms, and all other reviewed systems are negative.   ESS 12  ALLERGIES: No Known Allergies  HOME MEDICATIONS: Outpatient Medications Prior to Visit  Medication Sig Dispense Refill   amLODipine (NORVASC) 2.5 MG tablet Take 2.5 mg by mouth daily.     ASPIRIN 81 PO Take by mouth.     azaTHIOprine (IMURAN) 50 MG tablet Take 1 tablet by mouth daily.     ergocalciferol (VITAMIN D2) 50000 UNITS capsule Take 50,000 Units by mouth once a week.      fenofibrate 160 MG tablet Take 160 mg by mouth daily.     IRON PO Take 2 tablets by mouth daily. Iron 65mg      levothyroxine (SYNTHROID) 25 MCG tablet Take 50 mcg by mouth daily before breakfast.     loperamide (IMODIUM) 2 MG capsule Take 2 mg by mouth daily as needed for diarrhea or loose stools.     losartan (COZAAR) 25 MG tablet TAKE 2 TABLETS (50 MG) BY MOUTH EVERY EVENING. 180 tablet 1   Multiple Vitamin (MULTI VITAMIN DAILY PO) Take 1 tablet by mouth daily.     Multiple Vitamins-Minerals (PRESERVISION AREDS 2) CAPS Take by mouth. 1 in the am 1 in the pm     Zoster Vaccine Adjuvanted [facility]) injection Inject 0.5 mLs into the muscle. 0.5 mL 1   Methylcellulose,  Laxative, (CITRUCEL PO) Take by mouth as needed. (Patient not taking: Reported on 05/08/2022)     No facility-administered medications prior to visit.    PAST MEDICAL HISTORY: Past Medical History:  Diagnosis Date   Anemia    PO IRON    Aortic stenosis, mild    Cataract    Colonic hemorrhage 11/10/1962   Crohn's disease (HCC)    GERD (gastroesophageal reflux disease)    Heart murmur    Hyperlipidemia    Macular degeneration    PAC (premature atrial contraction)    Palpitations    Sleep apnea, obstructive 08/28/2020   on CPAP   Squamous cell skin cancer     PAST SURGICAL HISTORY: Past Surgical History:  Procedure Laterality Date   APPENDECTOMY     Biopsy Facial  05/10/2019   BLADDER REPAIR  1964   intestine leaks/bladder repair   CATARACT EXTRACTION W/ INTRAOCULAR LENS  IMPLANT, BILATERAL     2013   ILEOSTOMY  04/16/2012   Procedure: ILEOSTOMY;  Surgeon: Ernestene Mention, MD;  Location: [facility];  Service: General;  Laterality: N/A;   ILEOSTOMY CLOSURE  12/07/2012   ILEOSTOMY CLOSURE  12/07/2012   Procedure: ILEOSTOMY TAKEDOWN;  Surgeon: Ernestene Mention, MD;  Location: [facility] OR;  Service: General;  Laterality: N/A;   LAPAROTOMY  04/16/2012   Procedure: EXPLORATORY LAPAROTOMY;  Surgeon: Ernestene Mention, MD;  Location: [facility] OR;  Service: General;  Laterality: N/A;   LYSIS OF ADHESION  12/07/2012   Procedure: LYSIS OF ADHESION;  Surgeon: Ernestene Mention, MD;  Location: [facility] OR;  Service: General;  Laterality: N/A;   PROCTOSCOPY  12/07/2012   Procedure: PROCTOSCOPY;  Surgeon: Ernestene Mention, MD;  Location: [facility] OR;  Service: General;;  ILEO PROCTOSCOPY   STOMACH SURGERY  1959   ulcer   TONSILLECTOMY      FAMILY HISTORY: Family History  Problem Relation Age of Onset   Stroke Mother    Multiple myeloma Father    Heart disease Maternal Grandfather    Congestive Heart Failure Daughter    Heart disease Other        maternal side   Colon cancer Neg Hx     SOCIAL  HISTORY: Social History   Socioeconomic History   Marital status: Married    Spouse name: Not on file   Number of children: 3   Years of education: Not on file   Highest education level: Not on file  Occupational History   Occupation: Retired    Associate Professor: [employer]  Tobacco Use   Smoking status: Former    Packs/day: 1.00    Years: 2.00    Total pack years: 2.00    Types: Cigarettes    Quit date: 11/10/1964    Years since quitting: 57.5   Smokeless tobacco: Never  Vaping Use   Vaping Use: Never used  Substance and Sexual Activity   Alcohol use: No   Drug use: No   Sexual activity: Not on file  Other Topics Concern   Not on file  Social History Narrative   caffeinated drinks less than one a day. Eldest daughter passed from heart deficiency.    Social Determinants of Health   Financial Resource Strain: Not on file  Food Insecurity: Not on file  Transportation Needs: Not on file  Physical Activity: Not on file  Stress: Not on file  Social Connections: Not on file  Intimate Partner Violence: Not on file      PHYSICAL EXAM  Vitals:   05/08/22 0823  BP: 130/60  Pulse: (!) 53  Weight: 148 lb 8 oz (67.4 kg)  Height: 5\' 9"  (1.753 m)   Body mass index is 21.93 kg/m.  Generalized: Well developed, in no acute distress  Chest: Lungs clear to auscultation bilaterally  Neurological examination  Mentation: Alert oriented to time, place, history taking. Follows all commands speech and language fluent Cranial nerve II-XII: Extraocular movements were full, visual field were full on confrontational test Head turning and shoulder shrug  were normal and symmetric. Motor: The motor testing reveals 5 over 5 strength of all 4 extremities. Good symmetric motor tone is noted throughout.  Sensory: Sensory testing is intact to soft touch on all 4 extremities. No evidence of extinction is noted.  Gait and station: Gait is normal.    DIAGNOSTIC DATA (LABS, IMAGING, TESTING) -  I reviewed patient records, labs, notes, testing and imaging myself where available.  Lab Results  Component Value Date   WBC 5.2 03/18/2022   HGB 11.9 (L) 03/18/2022   HCT 34.1 (L) 03/18/2022   MCV 98.0 03/18/2022   PLT 222 03/18/2022      Component Value Date/Time   NA 138 03/18/2022 1202   NA 139 04/26/2020 1506   K 4.2  03/18/2022 1202   CL 110 03/18/2022 1202   CO2 21 (L) 03/18/2022 1202   GLUCOSE 96 03/18/2022 1202   BUN 17 03/18/2022 1202   BUN 13 04/26/2020 1506   CREATININE 1.25 (H) 03/18/2022 1202   CREATININE 1.06 05/18/2012 1151   CALCIUM 9.3 03/18/2022 1202   PROT 5.2 (L) 02/24/2015 0440   ALBUMIN 2.9 (L) 02/24/2015 0440   AST 23 02/24/2015 0440   ALT 14 02/24/2015 0440   ALKPHOS 41 02/24/2015 0440   BILITOT 0.7 02/24/2015 0440   GFRNONAA 57 (L) 03/18/2022 1202   GFRAA 61 04/26/2020 1506   Lab Results  Component Value Date   CHOL 60 04/19/2012   TRIG 103 04/19/2012   No results found for: "HGBA1C" No results found for: "VITAMINB12" Lab Results  Component Value Date   TSH 4.641 (H) 12/10/2012      ASSESSMENT AND PLAN 81 y.o. year old male  has a past medical history of Anemia, Aortic stenosis, mild, Cataract, Colonic hemorrhage (11/10/1962), Crohn's disease (HCC), GERD (gastroesophageal reflux disease), Heart murmur, Hyperlipidemia, Macular degeneration, PAC (premature atrial contraction), Palpitations, Sleep apnea, obstructive (08/28/2020), and Squamous cell skin cancer. here with:  OSA on CPAP  - CPAP compliance excellent -Residual AHI has improved with the change in pressure. We will keep the pressure the same - Encourage patient to use CPAP nightly and > 4 hours each night - F/U in 1 year or sooner if needed     Butch Penny, MSN, NP-C 05/08/2022, 8:41 AM [facility] [address], [address] [place], [place] [address] [phone]

## 2022-05-08 ENCOUNTER — Ambulatory Visit: Payer: PPO | Admitting: Adult Health

## 2022-05-08 VITALS — BP 130/60 | HR 53 | Ht 69.0 in | Wt 148.5 lb

## 2022-05-08 DIAGNOSIS — G4733 Obstructive sleep apnea (adult) (pediatric): Secondary | ICD-10-CM

## 2022-05-08 DIAGNOSIS — Z9989 Dependence on other enabling machines and devices: Secondary | ICD-10-CM

## 2022-05-08 NOTE — Progress Notes (Signed)
Primary Physician:  Nicholas Lopes, MD   Patient ID: Nicholas [name], male    DOB: [birth year], 81 y.o.   MRN: [id number]  Chief Complaint  Patient presents with   Bradycardia   Hypertension   Shortness of Breath   Follow-up    6 weeks    HPI   Nicholas [name]  is a 81 y.o. 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  He is only on ASA therapy per patient choice.   Patient was last seen in the office 03/31/22 for an acute visit with concerns of dyspnea. At that time ordered echocardiogram and treadmill stress test. Echocardiogram revealed preserved LVEF and no significant abnormalities. Stress test was overall low risk with fair exercise capacity. Patient now presents for follow up.  Patient reports that since last office visit he feels fatigue and dyspnea have been improving.  Denies PND, orthopnea, leg edema, chest pain.  Past Medical History:  Diagnosis Date   Anemia    PO IRON    Aortic stenosis, mild    Cataract    Colonic hemorrhage 11/10/1962   Crohn's disease (HCC)    GERD (gastroesophageal reflux disease)    Heart murmur    Hyperlipidemia    Macular degeneration    PAC (premature atrial contraction)    Palpitations    Sleep apnea, obstructive 08/28/2020   on CPAP   Squamous cell skin cancer     Family History  Problem Relation Age of Onset   Stroke Mother    Multiple myeloma Father    Heart disease Maternal Grandfather    Heart attack Daughter    Congestive Heart Failure Daughter    Heart disease Other        maternal side   Colon cancer Neg Hx    Social History   Tobacco Use   Smoking status: Former    Packs/day: 1.00    Years: 2.00    Total pack years: 2.00    Types: Cigarettes    Quit  date: 11/10/1964    Years since quitting: 57.5   Smokeless tobacco: Never  Substance Use Topics   Alcohol use: No    Marital Status: Married  ROS:   Review of Systems  Cardiovascular:  Positive for dyspnea on exertion (improving). Negative for chest pain and leg swelling.    Objective:  Blood pressure 119/66, pulse 60, temperature 98.3 F (36.8 C), temperature source Temporal, resp. rate 16, height '5\' 9"'  (1.753 m), weight 150 lb 6.4 oz (68.2 kg), SpO2 99 %. Body mass index is 22.21 kg/m.     05/09/2022    9:23 AM 05/08/2022    8:23 AM 03/31/2022   12:08 PM  Vitals with BMI  Height '5\' 9"'  '5\' 9"'  '5\' 9"'   Weight 150 lbs 6 oz 148 lbs 8 oz 151 lbs  BMI 22.2 03.40 35.24  Systolic 818 590 931  Diastolic 66 60 74  Pulse 60 53 60    Physical Exam Constitutional:      Appearance: He is well-developed.  Neck:     Vascular: No carotid bruit or JVD.  Cardiovascular:     Rate and Rhythm: Normal rate and regular rhythm.     Pulses: Normal pulses and intact distal  pulses.     Heart sounds: Murmur heard.     Mid to late systolic murmur is present with a grade of 2/6 at the lower left sternal border.  Pulmonary:     Effort: Pulmonary effort is normal. No accessory muscle usage.     Breath sounds: Normal breath sounds.  Abdominal:     General: Bowel sounds are normal.     Palpations: Abdomen is soft.  Musculoskeletal:     Right lower leg: No edema.     Left lower leg: No edema.   Physical unchanged.  Previous office visit. Laboratory examination:      Latest Ref Rng & Units 03/18/2022   12:02 PM 04/26/2020    3:06 PM 02/13/2020   12:43 PM  CMP  Glucose 70 - 99 mg/dL 96  106  100   BUN 8 - 23 mg/dL '17  13  19   ' Creatinine 0.61 - 1.24 mg/dL 1.25  1.27  1.32   Sodium 135 - 145 mmol/L 138  139  141   Potassium 3.5 - 5.1 mmol/L 4.2  4.5  4.5   Chloride 98 - 111 mmol/L 110  104  108   CO2 22 - 32 mmol/L '21  24  19   ' Calcium 8.9 - 10.3 mg/dL 9.3  9.5  10.1       Latest Ref Rng & Units  03/18/2022   12:02 PM 02/13/2020   12:43 PM 07/14/2016    3:39 AM  CBC  WBC 4.0 - 10.5 K/uL 5.2  7.3  4.9   Hemoglobin 13.0 - 17.0 g/dL 11.9  14.9  13.9   Hematocrit 39.0 - 52.0 % 34.1  45.0  42.5   Platelets 150 - 400 K/uL 222  282  205    Lipid Panel     Component Value Date/Time   CHOL 60 04/19/2012 0411   TRIG 103 04/19/2012 0411   HEMOGLOBIN A1C No results found for: "HGBA1C", "MPG" TSH No results for input(s): "TSH" in the last 8760 hours.  External labs:  Cholesterol, total 139.000 m 11/15/2021 HDL 44.000 mg 11/15/2021 LDL 75.000 mg 11/15/2021 Triglycerides 99.000 mg 11/15/2021  A1C 5.100 % 11/15/2021 TSH 4.040 11/15/2021  Allergies  No Known Allergies   Final Medications at End of Visit     Current Outpatient Medications:    amLODipine (NORVASC) 2.5 MG tablet, Take 2.5 mg by mouth daily., Disp: , Rfl:    ASPIRIN 81 PO, Take by mouth., Disp: , Rfl:    azaTHIOprine (IMURAN) 50 MG tablet, Take 1 tablet by mouth daily., Disp: , Rfl:    ergocalciferol (VITAMIN D2) 50000 UNITS capsule, Take 50,000 Units by mouth once a week. , Disp: , Rfl:    fenofibrate 160 MG tablet, Take 160 mg by mouth daily., Disp: , Rfl:    levothyroxine (SYNTHROID) 25 MCG tablet, Take 50 mcg by mouth daily before breakfast., Disp: , Rfl:    loperamide (IMODIUM) 2 MG capsule, Take 2 mg by mouth daily as needed for diarrhea or loose stools., Disp: , Rfl:    losartan (COZAAR) 25 MG tablet, TAKE 2 TABLETS (50 MG) BY MOUTH EVERY EVENING., Disp: 180 tablet, Rfl: 1   Methylcellulose, Laxative, (CITRUCEL PO), Take by mouth as needed., Disp: , Rfl:    Multiple Vitamin (MULTI VITAMIN DAILY PO), Take 1 tablet by mouth daily., Disp: , Rfl:    Multiple Vitamins-Minerals (PRESERVISION AREDS 2) CAPS, Take by mouth. 1 in the am 1 in the pm, Disp: ,  Rfl:   Radiology:   MRI abdomen with and without contrast 03/07/2020: 1.  Biliary and pancreatic ductal dilatation with abrupt cutoff of the common bile duct proximal to the  ampulla as described above. Subtle periampullary enhancement. Recommend correlation with ERCP, as underlying lesion could have this appearance. 2.  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 3.  Mild hepatic steatosis.  CT abdomen 01/02/2020: 1.  Status post subtotal colectomy with ileorectal anastomosis. No CT evidence of active disease. 2.  Mild biliary ductal fullness with smooth tapering toward the level of the ampulla, nonspecific. Correlate with biliary labs and ERCP if elevated.  3.  Sequelae of chronic pancreatitis with the pancreatic duct ectasia and punctate parenchymal calcifications. Subcentimeter cystic focus within the pancreatic head may reflect a side branch intraductal papillary mucinous neoplasm versus sequelae of pseudocyst. Consider 6-12 month follow-up with pancreas protocol MR.  Chest x-ray PA and lateral view 03/18/2022: Cardiac and mediastinal contours are within normal limits. No focal pulmonary opacity. No pleural effusion or pneumothorax. No acute osseous abnormality.  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 Also had narrow complex tachy for 15 minutes that again is suspicious for A. Fl with 2:1 conduction vs atrial tachycardia vs PSVT.  Event Monitor 09/16/2017: Sinus rhythm, heart rate 37-126 bpm. symptoms of fatigue with sinus rhythm at 80 bpm. No atrial fibrillation, atrial flutter  Exercise tetrofosmin stress test  02/29/2020: Normal ECG stress. The baseline blood pressure was 172/90 mmHg and increased to 208/80 mmHg, which is a hypertensive response to exercise. The patient exercised for 4 minutes and 45 seconds of a Bruce protocol, achieving approximately 7 METs.  Patient achieved 98% of MPHR. Myocardial perfusion is normal. Overall LV systolic function is normal without regional wall motion abnormalities. Stress LV EF: 71%.  Compared to 09/21/2017, no significant  change.  Echocardiogram 04/10/2022:  Left ventricle cavity is normal in size. Mild concentric hypertrophy of the left ventricle. Normal global wall motion. Normal LV systolic function with EF 56%. Normal diastolic filling pattern. No significant valvular abnormality.  Normal right atrial pressure.  Previous study in 2021 reported mod LVH, mild MR, mild TR-not well appreciated on this study.  Exercise treadmill stress test 05/02/2022: Exercise treadmill stress test performed using Bruce protocol.  Patient reached 7 METS, and 107% of age predicted maximum heart rate.  Exercise capacity was fair.  No chest pain reported.  Normal heart rate and hemodynamic response. Stress EKG revealed no ischemic changes. Low risk study.  EKG   EKG 03/18/2022: Normal sinus rhythm at rate of 55 bpm, normal axis, early R wave progression in V2 probably normal variant, no evidence of ischemia, normal EKG.  No significant change from 12/02/2021.  EKG 11/29/2020: Marked sinus bradycardia at rate of 46 bpm, otherwise normal EKG.  Compared to 02/17/2020, heart rate was 65 bpm and on 11/30/2019, heart rate was 56 bpm.    Assessment:     ICD-10-CM   1. Dyspnea on exertion  R06.09     2. Paroxysmal atrial fibrillation (HCC)  I48.0     3. Primary hypertension  I10       No orders of the defined types were placed in this encounter.   Medications Discontinued During This Encounter  Medication Reason   Zoster Vaccine Adjuvanted [facility]) injection    IRON PO    This patients CHA2DS2-VASc Score 3 (DM, Age)and yearly risk of stroke 3.2%.   Recommendations:  Nicholas [name]  is a 81 y.o.  with chronic dyspnea on exertion, sleep apnea (on CPAP), dyslipidemia, primary hypertension, stage IIIa chronic kidney disease, Crohn's disease on Aziothioprine, 1 episode of postop SVT/?  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  He is only on ASA therapy per  patient choice.   Patient was last seen in the office 03/31/22 for an acute visit with concerns of dyspnea. At that time ordered echocardiogram and treadmill stress test. Echocardiogram revealed preserved LVEF and no significant abnormalities. Stress test was overall low risk with fair exercise capacity. Patient now presents for follow up.  Reviewed and discussed results of echocardiogram and stress test, details above.  Both echo and stress test were reassuring for that patient's symptoms are likely not cardiac related.  Discussed with patient option of pulmonary rehab as well as referral to pulmonologist.  However patient is seen PCP in the next couple of weeks and wishes to follow-up with them first particularly as his symptoms have improved since last office visit.  Follow-up in 6 months, sooner if needed.   Alethia Berthold, PA-C 05/09/2022, 10:14 AM Office: [phone]

## 2022-05-09 ENCOUNTER — Encounter: Payer: Self-pay | Admitting: Student

## 2022-05-09 ENCOUNTER — Ambulatory Visit: Payer: PPO | Admitting: Student

## 2022-05-09 VITALS — BP 119/66 | HR 60 | Temp 98.3°F | Resp 16 | Ht 69.0 in | Wt 150.4 lb

## 2022-05-09 DIAGNOSIS — I480 Paroxysmal atrial fibrillation: Secondary | ICD-10-CM

## 2022-05-09 DIAGNOSIS — R0609 Other forms of dyspnea: Secondary | ICD-10-CM

## 2022-05-09 DIAGNOSIS — I131 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, with stage 1 through stage 4 chronic kidney disease, or unspecified chronic kidney disease: Secondary | ICD-10-CM | POA: Diagnosis not present

## 2022-05-09 DIAGNOSIS — E039 Hypothyroidism, unspecified: Secondary | ICD-10-CM | POA: Diagnosis not present

## 2022-05-09 DIAGNOSIS — I10 Essential (primary) hypertension: Secondary | ICD-10-CM

## 2022-05-12 ENCOUNTER — Other Ambulatory Visit (HOSPITAL_COMMUNITY): Payer: Self-pay

## 2022-05-14 ENCOUNTER — Other Ambulatory Visit (HOSPITAL_COMMUNITY): Payer: Self-pay

## 2022-05-15 ENCOUNTER — Other Ambulatory Visit (HOSPITAL_COMMUNITY): Payer: Self-pay

## 2022-05-26 DIAGNOSIS — K5090 Crohn's disease, unspecified, without complications: Secondary | ICD-10-CM | POA: Diagnosis not present

## 2022-05-26 DIAGNOSIS — I10 Essential (primary) hypertension: Secondary | ICD-10-CM | POA: Diagnosis not present

## 2022-05-26 DIAGNOSIS — N1831 Chronic kidney disease, stage 3a: Secondary | ICD-10-CM | POA: Diagnosis not present

## 2022-05-26 DIAGNOSIS — R7301 Impaired fasting glucose: Secondary | ICD-10-CM | POA: Diagnosis not present

## 2022-05-26 DIAGNOSIS — E785 Hyperlipidemia, unspecified: Secondary | ICD-10-CM | POA: Diagnosis not present

## 2022-05-26 DIAGNOSIS — E46 Unspecified protein-calorie malnutrition: Secondary | ICD-10-CM | POA: Diagnosis not present

## 2022-05-26 DIAGNOSIS — K912 Postsurgical malabsorption, not elsewhere classified: Secondary | ICD-10-CM | POA: Diagnosis not present

## 2022-05-26 DIAGNOSIS — G4733 Obstructive sleep apnea (adult) (pediatric): Secondary | ICD-10-CM | POA: Diagnosis not present

## 2022-05-26 DIAGNOSIS — E291 Testicular hypofunction: Secondary | ICD-10-CM | POA: Diagnosis not present

## 2022-05-26 DIAGNOSIS — E559 Vitamin D deficiency, unspecified: Secondary | ICD-10-CM | POA: Diagnosis not present

## 2022-05-26 DIAGNOSIS — I480 Paroxysmal atrial fibrillation: Secondary | ICD-10-CM | POA: Diagnosis not present

## 2022-05-26 DIAGNOSIS — E039 Hypothyroidism, unspecified: Secondary | ICD-10-CM | POA: Diagnosis not present

## 2022-06-02 ENCOUNTER — Ambulatory Visit: Payer: PPO | Admitting: Student

## 2022-07-02 ENCOUNTER — Other Ambulatory Visit (HOSPITAL_COMMUNITY): Payer: Self-pay

## 2022-07-02 DIAGNOSIS — G4733 Obstructive sleep apnea (adult) (pediatric): Secondary | ICD-10-CM | POA: Diagnosis not present

## 2022-07-02 DIAGNOSIS — H5000 Unspecified esotropia: Secondary | ICD-10-CM | POA: Diagnosis not present

## 2022-07-02 DIAGNOSIS — H532 Diplopia: Secondary | ICD-10-CM | POA: Diagnosis not present

## 2022-07-02 NOTE — Progress Notes (Signed)
Received fax from [facility] that received orders, PA done on supplies approved. [employer].

## 2022-09-01 DIAGNOSIS — N1831 Chronic kidney disease, stage 3a: Secondary | ICD-10-CM | POA: Diagnosis not present

## 2022-09-01 DIAGNOSIS — E46 Unspecified protein-calorie malnutrition: Secondary | ICD-10-CM | POA: Diagnosis not present

## 2022-09-01 DIAGNOSIS — Z23 Encounter for immunization: Secondary | ICD-10-CM | POA: Diagnosis not present

## 2022-09-01 DIAGNOSIS — K5090 Crohn's disease, unspecified, without complications: Secondary | ICD-10-CM | POA: Diagnosis not present

## 2022-09-01 DIAGNOSIS — G4733 Obstructive sleep apnea (adult) (pediatric): Secondary | ICD-10-CM | POA: Diagnosis not present

## 2022-09-01 DIAGNOSIS — D84821 Immunodeficiency due to drugs: Secondary | ICD-10-CM | POA: Diagnosis not present

## 2022-09-01 DIAGNOSIS — E291 Testicular hypofunction: Secondary | ICD-10-CM | POA: Diagnosis not present

## 2022-09-01 DIAGNOSIS — I131 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, with stage 1 through stage 4 chronic kidney disease, or unspecified chronic kidney disease: Secondary | ICD-10-CM | POA: Diagnosis not present

## 2022-09-01 DIAGNOSIS — E039 Hypothyroidism, unspecified: Secondary | ICD-10-CM | POA: Diagnosis not present

## 2022-09-01 DIAGNOSIS — R7301 Impaired fasting glucose: Secondary | ICD-10-CM | POA: Diagnosis not present

## 2022-09-18 DIAGNOSIS — H6123 Impacted cerumen, bilateral: Secondary | ICD-10-CM | POA: Diagnosis not present

## 2022-09-25 DIAGNOSIS — Z980 Intestinal bypass and anastomosis status: Secondary | ICD-10-CM | POA: Diagnosis not present

## 2022-09-25 DIAGNOSIS — K50813 Crohn's disease of both small and large intestine with fistula: Secondary | ICD-10-CM | POA: Diagnosis not present

## 2022-09-25 DIAGNOSIS — K869 Disease of pancreas, unspecified: Secondary | ICD-10-CM | POA: Diagnosis not present

## 2022-09-25 DIAGNOSIS — D490 Neoplasm of unspecified behavior of digestive system: Secondary | ICD-10-CM | POA: Diagnosis not present

## 2022-09-25 DIAGNOSIS — Z79624 Long term (current) use of inhibitors of nucleotide synthesis: Secondary | ICD-10-CM | POA: Diagnosis not present

## 2022-09-25 DIAGNOSIS — Z9049 Acquired absence of other specified parts of digestive tract: Secondary | ICD-10-CM | POA: Diagnosis not present

## 2022-09-25 DIAGNOSIS — K5090 Crohn's disease, unspecified, without complications: Secondary | ICD-10-CM | POA: Diagnosis not present

## 2022-10-01 DIAGNOSIS — K50813 Crohn's disease of both small and large intestine with fistula: Secondary | ICD-10-CM | POA: Diagnosis not present

## 2022-10-06 DIAGNOSIS — K50813 Crohn's disease of both small and large intestine with fistula: Secondary | ICD-10-CM | POA: Diagnosis not present

## 2022-10-27 DIAGNOSIS — H5000 Unspecified esotropia: Secondary | ICD-10-CM | POA: Diagnosis not present

## 2022-10-27 DIAGNOSIS — H353132 Nonexudative age-related macular degeneration, bilateral, intermediate dry stage: Secondary | ICD-10-CM | POA: Diagnosis not present

## 2022-10-28 ENCOUNTER — Emergency Department (HOSPITAL_COMMUNITY)
Admission: EM | Admit: 2022-10-28 | Discharge: 2022-10-28 | Discharge status: Against Medical Advice | Payer: PPO | Attending: Emergency Medicine | Admitting: Emergency Medicine

## 2022-10-28 ENCOUNTER — Encounter (HOSPITAL_COMMUNITY): Payer: Self-pay

## 2022-10-28 ENCOUNTER — Emergency Department (HOSPITAL_COMMUNITY): Payer: PPO

## 2022-10-28 ENCOUNTER — Other Ambulatory Visit: Payer: Self-pay

## 2022-10-28 DIAGNOSIS — R0602 Shortness of breath: Secondary | ICD-10-CM | POA: Diagnosis not present

## 2022-10-28 DIAGNOSIS — R000 Tachycardia, unspecified: Secondary | ICD-10-CM | POA: Insufficient documentation

## 2022-10-28 DIAGNOSIS — R739 Hyperglycemia, unspecified: Secondary | ICD-10-CM | POA: Diagnosis not present

## 2022-10-28 DIAGNOSIS — Z5321 Procedure and treatment not carried out due to patient leaving prior to being seen by health care provider: Secondary | ICD-10-CM | POA: Insufficient documentation

## 2022-10-28 DIAGNOSIS — R002 Palpitations: Secondary | ICD-10-CM | POA: Diagnosis not present

## 2022-10-28 LAB — COMPREHENSIVE METABOLIC PANEL
ALT: 20 U/L (ref 0–44)
AST: 33 U/L (ref 15–41)
Albumin: 3.7 g/dL (ref 3.5–5.0)
Alkaline Phosphatase: 74 U/L (ref 38–126)
Anion gap: 11 (ref 5–15)
BUN: 17 mg/dL (ref 8–23)
CO2: 20 mmol/L — ABNORMAL LOW (ref 22–32)
Calcium: 9.7 mg/dL (ref 8.9–10.3)
Chloride: 107 mmol/L (ref 98–111)
Creatinine, Ser: 1.01 mg/dL (ref 0.61–1.24)
GFR, Estimated: >60 mL/min (ref >60)
Glucose, Bld: 106 mg/dL — ABNORMAL HIGH (ref 70–99)
Potassium: 3.8 mmol/L (ref 3.5–5.1)
Sodium: 138 mmol/L (ref 135–145)
Total Bilirubin: 0.9 mg/dL (ref 0.3–1.2)
Total Protein: 6.3 g/dL — ABNORMAL LOW (ref 6.5–8.1)

## 2022-10-28 LAB — TROPONIN I (HIGH SENSITIVITY): Troponin I (High Sensitivity): 6 ng/L (ref <18)

## 2022-10-28 LAB — CBC WITH DIFFERENTIAL/PLATELET
Abs Immature Granulocytes: 0.02 10*3/uL (ref 0.00–0.07)
Basophils Absolute: 0 10*3/uL (ref 0.0–0.1)
Basophils Relative: 0 %
Eosinophils Absolute: 0 10*3/uL (ref 0.0–0.5)
Eosinophils Relative: 0 %
HCT: 38.3 % — ABNORMAL LOW (ref 39.0–52.0)
Hemoglobin: 13 g/dL (ref 13.0–17.0)
Immature Granulocytes: 0 %
Lymphocytes Relative: 2 %
Lymphs Abs: 0.2 10*3/uL — ABNORMAL LOW (ref 0.7–4.0)
MCH: 33.7 pg (ref 26.0–34.0)
MCHC: 33.9 g/dL (ref 30.0–36.0)
MCV: 99.2 fL (ref 80.0–100.0)
Monocytes Absolute: 0.4 10*3/uL (ref 0.1–1.0)
Monocytes Relative: 6 %
Neutro Abs: 6.7 10*3/uL (ref 1.7–7.7)
Neutrophils Relative %: 92 %
Platelets: 234 10*3/uL (ref 150–400)
RBC: 3.86 MIL/uL — ABNORMAL LOW (ref 4.22–5.81)
RDW: 12.6 % (ref 11.5–15.5)
WBC: 7.3 10*3/uL (ref 4.0–10.5)
nRBC: 0 % (ref 0.0–0.2)

## 2022-10-28 NOTE — ED Notes (Signed)
Patient informed this tech he was leaving, IV removed by this tech

## 2022-10-28 NOTE — ED Provider Triage Note (Signed)
Emergency Medicine Provider Triage Evaluation Note  Nicholas [name] , a 81 y.o. male  was evaluated in triage.  Pt complains of palpitations.  Felt heart fluttering this morning.  Stated lasted about few seconds.  Also endorses associated shortness of breath.  Called his cardiologist who advised him to be evaluated in the emergency department.  Per EMS, patient was in sinus rhythm and tachypneic on arrival.  Was otherwise stable and round.  Denies chest pain and syncope.  Review of Systems  Positive: See above Negative: See above  Physical Exam  BP (!) 136/112 (BP Location: Right Arm)   Pulse 70   Resp (!) 26   SpO2 100%  Gen:   Awake, no distress   Resp:  Normal effort  MSK:   Moves extremities without difficulty  Other:  AAO x3  Medical Decision Making  Medically screening exam initiated at 10:58 AM.  Appropriate orders placed.  Nicholas [name] was informed that the remainder of the evaluation will be completed by another provider, this initial triage assessment does not replace that evaluation, and the importance of remaining in the ED until their evaluation is complete.  Work up started   Nicholas Pho, PA-C 10/28/22 1102

## 2022-10-28 NOTE — ED Triage Notes (Addendum)
Pt reports sob with exertion, states his heart rhythm has been irregular based on a device he has been wearing at home for the past several weeks. Pt denies cp. Pt is AxOx4. Reports hx of Afib. EKG in ED showed NSR. Pt in nad at this time

## 2022-10-28 NOTE — ED Triage Notes (Signed)
EMS stated, He called 911 and he was NSR on the monitor. He was real anxious and was suppose to see a dermatologist today.

## 2022-11-13 DIAGNOSIS — C4442 Squamous cell carcinoma of skin of scalp and neck: Secondary | ICD-10-CM | POA: Diagnosis not present

## 2022-11-13 DIAGNOSIS — D492 Neoplasm of unspecified behavior of bone, soft tissue, and skin: Secondary | ICD-10-CM | POA: Diagnosis not present

## 2022-11-13 DIAGNOSIS — D044 Carcinoma in situ of skin of scalp and neck: Secondary | ICD-10-CM | POA: Diagnosis not present

## 2022-11-13 DIAGNOSIS — L570 Actinic keratosis: Secondary | ICD-10-CM | POA: Diagnosis not present

## 2022-11-13 DIAGNOSIS — L821 Other seborrheic keratosis: Secondary | ICD-10-CM | POA: Diagnosis not present

## 2022-11-13 DIAGNOSIS — L578 Other skin changes due to chronic exposure to nonionizing radiation: Secondary | ICD-10-CM | POA: Diagnosis not present

## 2022-11-13 DIAGNOSIS — C44222 Squamous cell carcinoma of skin of right ear and external auricular canal: Secondary | ICD-10-CM | POA: Diagnosis not present

## 2022-11-13 DIAGNOSIS — C44629 Squamous cell carcinoma of skin of left upper limb, including shoulder: Secondary | ICD-10-CM | POA: Diagnosis not present

## 2022-11-14 ENCOUNTER — Encounter: Payer: Self-pay | Admitting: Cardiology

## 2022-11-14 ENCOUNTER — Ambulatory Visit: Payer: PPO | Admitting: Cardiology

## 2022-11-14 VITALS — BP 133/62 | HR 62 | Ht 69.0 in | Wt 155.0 lb

## 2022-11-14 DIAGNOSIS — Z8679 Personal history of other diseases of the circulatory system: Secondary | ICD-10-CM

## 2022-11-14 DIAGNOSIS — I10 Essential (primary) hypertension: Secondary | ICD-10-CM | POA: Diagnosis not present

## 2022-11-14 DIAGNOSIS — R0609 Other forms of dyspnea: Secondary | ICD-10-CM

## 2022-11-14 NOTE — Progress Notes (Unsigned)
Primary Physician:  Donnajean Lopes, MD   Patient ID: Nicholas [name], male    DOB: [birth year], 82 y.o.   MRN: [id number]  Chief Complaint  Patient presents with   Atrial Fibrillation   Hypertension   Hyperlipidemia   Follow-up    6 months    HPI   Nicholas [name]  is a 82 y.o. 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  He is only on ASA therapy per patient choice.   Patient was seen by [place] 6 months ago and he was essentially asymptomatic.  However he had an episode of shingles in September 2022, since then he has noticed decreased exercise tolerance and now he presents as an acute visit for marked dyspnea on exertion even doing minimal activities around the house.  No PND or orthopnea, no leg edema.  Denies any chest pain.     Past Medical History:  Diagnosis Date   Anemia    PO IRON    Aortic stenosis, mild    Cataract    Colonic hemorrhage 11/10/1962   Crohn's disease (HCC)    GERD (gastroesophageal reflux disease)    Heart murmur    Hyperlipidemia    Macular degeneration    PAC (premature atrial contraction)    Palpitations    Sleep apnea, obstructive 08/28/2020   on CPAP   Squamous cell skin cancer     Family History  Problem Relation Age of Onset   Stroke Mother    Multiple myeloma Father    Heart disease Maternal Grandfather    Heart attack Daughter    Congestive Heart Failure Daughter    Heart disease Other        maternal side   Colon cancer Neg Hx    Social History   Tobacco Use   Smoking status: Former    Packs/day: 1.00    Years: 2.00    Total pack years: 2.00    Types: Cigarettes    Quit date: 11/10/1964    Years since quitting: 58.0   Smokeless tobacco: Never  Substance Use Topics    Alcohol use: No    Marital Status: Married  ROS:   Review of Systems  Cardiovascular:  Positive for dyspnea on exertion. Negative for chest pain and leg swelling.    Objective:  Blood pressure 133/62, pulse 62, height '5\' 9"'$  (1.753 m), weight 155 lb (70.3 kg), SpO2 97 %. Body mass index is 22.89 kg/m.     11/14/2022    2:32 PM 10/28/2022   11:48 AM 10/28/2022   11:44 AM  Vitals with BMI  Height '5\' 9"'$  '5\' 9"'$    Weight 155 lbs 148 lbs   BMI 31.54 00.86   Systolic 761  950  Diastolic 62  71  Pulse 62      Physical Exam Constitutional:      Appearance: He is well-developed.  Neck:     Vascular: No carotid bruit or JVD.  Cardiovascular:     Rate and Rhythm: Normal rate and regular rhythm.     Pulses: Normal pulses and intact distal pulses.     Heart sounds: Murmur heard.     Mid to late systolic murmur is present with a grade of 2/6 at the  lower left sternal border.  Pulmonary:     Effort: Pulmonary effort is normal. No accessory muscle usage.     Breath sounds: Normal breath sounds.  Abdominal:     General: Bowel sounds are normal.     Palpations: Abdomen is soft.  Musculoskeletal:     Right lower leg: No edema.     Left lower leg: No edema.    Laboratory examination:      Latest Ref Rng & Units 10/28/2022   11:01 AM 03/18/2022   12:02 PM 04/26/2020    3:06 PM  CMP  Glucose 70 - 99 mg/dL 106  96  106   BUN 8 - 23 mg/dL '17  17  13   '$ Creatinine 0.61 - 1.24 mg/dL 1.01  1.25  1.27   Sodium 135 - 145 mmol/L 138  138  139   Potassium 3.5 - 5.1 mmol/L 3.8  4.2  4.5   Chloride 98 - 111 mmol/L 107  110  104   CO2 22 - 32 mmol/L '20  21  24   '$ Calcium 8.9 - 10.3 mg/dL 9.7  9.3  9.5   Total Protein 6.5 - 8.1 g/dL 6.3     Total Bilirubin 0.3 - 1.2 mg/dL 0.9     Alkaline Phos 38 - 126 U/L 74     AST 15 - 41 U/L 33     ALT 0 - 44 U/L 20         Latest Ref Rng & Units 10/28/2022   11:01 AM 03/18/2022   12:02 PM 02/13/2020   12:43 PM  CBC  WBC 4.0 - 10.5 K/uL 7.3  5.2  7.3    Hemoglobin 13.0 - 17.0 g/dL 13.0  11.9  14.9   Hematocrit 39.0 - 52.0 % 38.3  34.1  45.0   Platelets 150 - 400 K/uL 234  222  282    Lipid Panel     Component Value Date/Time   CHOL 60 04/19/2012 0411   TRIG 103 04/19/2012 0411   HEMOGLOBIN A1C No results found for: "HGBA1C", "MPG" TSH No results for input(s): "TSH" in the last 8760 hours.  External labs:  Cholesterol, total 139.000 m 11/15/2021 HDL 44.000 mg 11/15/2021 LDL 75.000 mg 11/15/2021 Triglycerides 99.000 mg 11/15/2021  A1C 5.100 % 11/15/2021 TSH 4.040 11/15/2021  Allergies  No Known Allergies   Final Medications at End of Visit     Current Outpatient Medications:    amLODipine (NORVASC) 2.5 MG tablet, Take 2.5 mg by mouth daily., Disp: , Rfl:    azaTHIOprine (IMURAN) 50 MG tablet, Take 1 tablet by mouth daily., Disp: , Rfl:    ergocalciferol (VITAMIN D2) 50000 UNITS capsule, Take 50,000 Units by mouth once a week. , Disp: , Rfl:    levothyroxine (SYNTHROID) 50 MCG tablet, Take 50 mcg by mouth daily before breakfast., Disp: , Rfl:    loperamide (IMODIUM) 2 MG capsule, Take 2 mg by mouth daily as needed for diarrhea or loose stools., Disp: , Rfl:    losartan (COZAAR) 25 MG tablet, TAKE 2 TABLETS (50 MG) BY MOUTH EVERY EVENING., Disp: 180 tablet, Rfl: 1   Methylcellulose, Laxative, (CITRUCEL PO), Take by mouth as needed., Disp: , Rfl:    Multiple Vitamin (MULTI VITAMIN DAILY PO), Take 1 tablet by mouth daily., Disp: , Rfl:    Multiple Vitamins-Minerals (PRESERVISION AREDS 2) CAPS, Take by mouth. 1 in the am 1 in the pm, Disp: , Rfl:    ASPIRIN 81 PO,  Take by mouth. (Patient not taking: Reported on 11/14/2022), Disp: , Rfl:    Zoster Vaccine Adjuvanted [facility]) injection, Inject 0.5 mLs into the muscle., Disp: 0.5 mL, Rfl: 1  Radiology:   MRI abdomen with and without contrast 03/07/2020: 1.  Biliary and pancreatic ductal dilatation with abrupt cutoff of the common bile duct proximal to the ampulla as described above. Subtle  periampullary enhancement. Recommend correlation with ERCP, as underlying lesion could have this appearance. 2.  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 3.  Mild hepatic steatosis.  CT abdomen 01/02/2020: 1.  Status post subtotal colectomy with ileorectal anastomosis. No CT evidence of active disease. 2.  Mild biliary ductal fullness with smooth tapering toward the level of the ampulla, nonspecific. Correlate with biliary labs and ERCP if elevated.  3.  Sequelae of chronic pancreatitis with the pancreatic duct ectasia and punctate parenchymal calcifications. Subcentimeter cystic focus within the pancreatic head may reflect a side branch intraductal papillary mucinous neoplasm versus sequelae of pseudocyst. Consider 6-12 month follow-up with pancreas protocol MR.  Chest x-ray PA and lateral view 03/18/2022: Cardiac and mediastinal contours are within normal limits. No focal pulmonary opacity. No pleural effusion or pneumothorax. No acute osseous abnormality.  Cardiac Studies:   Telemetry 12/10/2012: Wide-complex tachycardia with retrograde "P" suspect AVRT with RVR '@150'$ /min. Also had narrow complex tachy for 15 minutes that again is suspicious for A. Fl with 2:1 conduction vs atrial tachycardia vs PSVT.  Event Monitor 09/16/2017: Sinus rhythm, heart rate 37-126 bpm. symptoms of fatigue with sinus rhythm at 80 bpm. No atrial fibrillation, atrial flutter  Echocardiogram 02/28/2020:  Normal LV systolic function with visual EF 50-55%. Left ventricle cavity is normal in size. Moderate left ventricular hypertrophy. Normal global wall motion. Normal diastolic filling pattern, normal LAP.  Structurally normal mitral valve. No evidence of mitral stenosis. Mild (Grade I) mitral regurgitation.  Structurally normal tricuspid valve. No evidence of tricuspid stenosis.  Mild tricuspid regurgitation.  Compared to previous study dated 09/28/2017 Mild MR and TR are new. LVEF remains  stable.  Exercise tetrofosmin stress test  02/29/2020: Normal ECG stress. The baseline blood pressure was 172/90 mmHg and increased to 208/80 mmHg, which is a hypertensive response to exercise. The patient exercised for 4 minutes and 45 seconds of a Bruce protocol, achieving approximately 7 METs.  Patient achieved 98% of MPHR. Myocardial perfusion is normal. Overall LV systolic function is normal without regional wall motion abnormalities. Stress LV EF: 71%.  Compared to 09/21/2017, no significant change.  PCV CARDIAC STRESS TEST 05/02/2022  Narrative Exercise treadmill stress test 05/02/2022: Exercise treadmill stress test performed using Bruce protocol.  Patient reached 7 METS, and 107% of age predicted maximum heart rate.  Exercise capacity was fair.  No chest pain reported.  Normal heart rate and hemodynamic response. Stress EKG revealed no ischemic changes. Low risk study.   PCV MYOCARDIAL PERFUSION WO LEXISCAN 02/29/2020  Narrative Exercise tetrofosmin stress test  02/29/2020: Normal ECG stress. The baseline blood pressure was 172/90 mmHg and increased to 208/80 mmHg, which is a hypertensive response to exercise. The patient exercised for 4 minutes and 45 seconds of a Bruce protocol, achieving approximately 7 METs. Myocardial perfusion is normal. Overall LV systolic function is normal without regional wall motion abnormalities. Stress LV EF: 71%. Compared to 09/21/2017, no significant change.  PCV ECHOCARDIOGRAM COMPLETE 04/10/2022  Narrative Echocardiogram 04/10/2022: Left ventricle cavity is normal in size. Mild concentric hypertrophy of the left ventricle. Normal global wall motion. Normal LV  systolic function with EF 56%. Normal diastolic filling pattern. No significant valvular abnormality. Normal right atrial pressure. Previous study in 2021 reported mod LVH, mild MR, mild TR-not well appreciated on this study.   PCV ECHOCARDIOGRAM COMPLETE  02/28/2020  Narrative Echocardiogram 02/28/2020: Normal LV systolic function with visual EF 50-55%. Left ventricle cavity is normal in size. Moderate left ventricular hypertrophy. Normal global wall motion. Normal diastolic filling pattern, normal LAP. Structurally normal mitral valve. No evidence of mitral stenosis. Mild (Grade I) mitral regurgitation. Structurally normal tricuspid valve. No evidence of tricuspid stenosis. Mild tricuspid regurgitation. Compared to previous study dated 09/28/2017 Mild MR and TR are new. LVEF remains stable.     EKG  502 EKG 03/18/2022: Normal sinus rhythm at rate of 55 bpm, normal axis, early R wave progression in V2 probably normal variant, no evidence of ischemia, normal EKG.  No significant change from 12/02/2021.  EKG 11/29/2020: Marked sinus bradycardia at rate of 46 bpm, otherwise normal EKG.  Compared to 02/17/2020, heart rate was 65 bpm and on 11/30/2019, heart rate was 56 bpm.    Assessment:     ICD-10-CM   1. Paroxysmal atrial fibrillation (HCC)  I48.0 EKG 12-Lead    2. Dyspnea on exertion  R06.09     3. Primary hypertension  I10       No orders of the defined types were placed in this encounter.   Medications Discontinued During This Encounter  Medication Reason   fenofibrate 160 MG tablet Discontinued by provider   levothyroxine (SYNTHROID) 25 MCG tablet Dose change   This patients CHA2DS2-VASc Score 3 (DM, Age)and yearly risk of stroke 3.2%.   Recommendations:   Nicholas [name]  is a 82 y.o.  with chronic dyspnea on exertion, sleep apnea (on CPAP), dyslipidemia, primary hypertension, stage IIIa chronic kidney disease, Crohn's disease on Aziothioprine, 1 episode of postop SVT/?  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  He is only on ASA therapy per patient choice.   Patient was seen by [place] 6 months ago and he was essentially asymptomatic.  However he had an episode  of shingles in September 2022, since then he has noticed decreased exercise tolerance and now he presents as an acute visit for marked dyspnea on exertion even doing minimal activities around the house.  No PND or orthopnea, no leg edema.  Denies any chest pain.  I do not know the etiology for his marked dyspnea on exertion.  No clinical evidence of heart failure.  His BNP and chest x-ray were normal in the hospital and EKG did not reveal any significant bradycardia or arrhythmias.  I am beginning to wonder whether he has acutely deconditioned or his dyspnea may be malaise and fatigue related to azathioprine therapy.  I will set him up for a routine treadmill exercise stress test to evaluate his exercise capacity and also an echocardiogram to evaluate his LV systolic function.  If these are normal, we could consider either a pulmonary referral or referral to pulmonary rehab.  If symptoms persist, close monitoring of his azathioprine therapy may be indicated.  Follow-up in 6 weeks, sooner if needed.  I reviewed his external labs, lipids and excellent control, stage III kidney disease stable, CBC is also stable with mild anemia again may be related to azathioprine therapy.  I reviewed his EKG, normal sinus rhythm.  This was a 40-minute office visit encounter.  If no cardiac  etiology is found, we will see him back on a as needed basis.   Adrian Prows, PA-C 11/14/2022, 3:16 PM Office: [phone]

## 2022-11-16 ENCOUNTER — Encounter: Payer: Self-pay | Admitting: Cardiology

## 2022-12-25 DIAGNOSIS — Z125 Encounter for screening for malignant neoplasm of prostate: Secondary | ICD-10-CM | POA: Diagnosis not present

## 2022-12-25 DIAGNOSIS — E559 Vitamin D deficiency, unspecified: Secondary | ICD-10-CM | POA: Diagnosis not present

## 2022-12-25 DIAGNOSIS — Z79899 Other long term (current) drug therapy: Secondary | ICD-10-CM | POA: Diagnosis not present

## 2022-12-25 DIAGNOSIS — E291 Testicular hypofunction: Secondary | ICD-10-CM | POA: Diagnosis not present

## 2022-12-25 DIAGNOSIS — E785 Hyperlipidemia, unspecified: Secondary | ICD-10-CM | POA: Diagnosis not present

## 2022-12-25 DIAGNOSIS — E039 Hypothyroidism, unspecified: Secondary | ICD-10-CM | POA: Diagnosis not present

## 2022-12-25 DIAGNOSIS — R7301 Impaired fasting glucose: Secondary | ICD-10-CM | POA: Diagnosis not present

## 2022-12-31 DIAGNOSIS — R82998 Other abnormal findings in urine: Secondary | ICD-10-CM | POA: Diagnosis not present

## 2023-01-01 DIAGNOSIS — E039 Hypothyroidism, unspecified: Secondary | ICD-10-CM | POA: Diagnosis not present

## 2023-01-01 DIAGNOSIS — E559 Vitamin D deficiency, unspecified: Secondary | ICD-10-CM | POA: Diagnosis not present

## 2023-01-01 DIAGNOSIS — Z1331 Encounter for screening for depression: Secondary | ICD-10-CM | POA: Diagnosis not present

## 2023-01-01 DIAGNOSIS — K912 Postsurgical malabsorption, not elsewhere classified: Secondary | ICD-10-CM | POA: Diagnosis not present

## 2023-01-01 DIAGNOSIS — G4733 Obstructive sleep apnea (adult) (pediatric): Secondary | ICD-10-CM | POA: Diagnosis not present

## 2023-01-01 DIAGNOSIS — I10 Essential (primary) hypertension: Secondary | ICD-10-CM | POA: Diagnosis not present

## 2023-01-01 DIAGNOSIS — T739 Effect of deprivation, unspecified: Secondary | ICD-10-CM | POA: Diagnosis not present

## 2023-01-01 DIAGNOSIS — R7301 Impaired fasting glucose: Secondary | ICD-10-CM | POA: Diagnosis not present

## 2023-01-01 DIAGNOSIS — I739 Peripheral vascular disease, unspecified: Secondary | ICD-10-CM | POA: Diagnosis not present

## 2023-01-01 DIAGNOSIS — Z0000 Encounter for general adult medical examination without abnormal findings: Secondary | ICD-10-CM | POA: Diagnosis not present

## 2023-01-01 DIAGNOSIS — Z1339 Encounter for screening examination for other mental health and behavioral disorders: Secondary | ICD-10-CM | POA: Diagnosis not present

## 2023-01-01 DIAGNOSIS — K5090 Crohn's disease, unspecified, without complications: Secondary | ICD-10-CM | POA: Diagnosis not present

## 2023-01-01 DIAGNOSIS — I480 Paroxysmal atrial fibrillation: Secondary | ICD-10-CM | POA: Diagnosis not present

## 2023-01-08 ENCOUNTER — Telehealth: Payer: Self-pay

## 2023-01-08 DIAGNOSIS — D0421 Carcinoma in situ of skin of right ear and external auricular canal: Secondary | ICD-10-CM | POA: Diagnosis not present

## 2023-01-08 DIAGNOSIS — Z872 Personal history of diseases of the skin and subcutaneous tissue: Secondary | ICD-10-CM | POA: Diagnosis not present

## 2023-01-08 DIAGNOSIS — L821 Other seborrheic keratosis: Secondary | ICD-10-CM | POA: Diagnosis not present

## 2023-01-08 DIAGNOSIS — Z08 Encounter for follow-up examination after completed treatment for malignant neoplasm: Secondary | ICD-10-CM | POA: Diagnosis not present

## 2023-01-08 DIAGNOSIS — Z85828 Personal history of other malignant neoplasm of skin: Secondary | ICD-10-CM | POA: Diagnosis not present

## 2023-01-08 DIAGNOSIS — D0462 Carcinoma in situ of skin of left upper limb, including shoulder: Secondary | ICD-10-CM | POA: Diagnosis not present

## 2023-01-08 NOTE — Telephone Encounter (Signed)
Received referral from [facility] for pt regarding sleep but pt is still active in our office and does not need referral to be seen. Referring office notified via fax. If pt needs to be seen he may call and schedule an office visit with Dr. Rexene Alberts or an NP.

## 2023-01-26 DIAGNOSIS — R58 Hemorrhage, not elsewhere classified: Secondary | ICD-10-CM | POA: Diagnosis not present

## 2023-01-26 DIAGNOSIS — L989 Disorder of the skin and subcutaneous tissue, unspecified: Secondary | ICD-10-CM | POA: Diagnosis not present

## 2023-01-26 DIAGNOSIS — D492 Neoplasm of unspecified behavior of bone, soft tissue, and skin: Secondary | ICD-10-CM | POA: Diagnosis not present

## 2023-02-19 DIAGNOSIS — C44329 Squamous cell carcinoma of skin of other parts of face: Secondary | ICD-10-CM | POA: Diagnosis not present

## 2023-03-16 DIAGNOSIS — L570 Actinic keratosis: Secondary | ICD-10-CM | POA: Diagnosis not present

## 2023-03-16 DIAGNOSIS — L821 Other seborrheic keratosis: Secondary | ICD-10-CM | POA: Diagnosis not present

## 2023-03-16 DIAGNOSIS — D225 Melanocytic nevi of trunk: Secondary | ICD-10-CM | POA: Diagnosis not present

## 2023-03-16 DIAGNOSIS — L814 Other melanin hyperpigmentation: Secondary | ICD-10-CM | POA: Diagnosis not present

## 2023-03-26 DIAGNOSIS — K8689 Other specified diseases of pancreas: Secondary | ICD-10-CM | POA: Diagnosis not present

## 2023-03-26 DIAGNOSIS — K50813 Crohn's disease of both small and large intestine with fistula: Secondary | ICD-10-CM | POA: Diagnosis not present

## 2023-03-26 DIAGNOSIS — D490 Neoplasm of unspecified behavior of digestive system: Secondary | ICD-10-CM | POA: Diagnosis not present

## 2023-03-31 DIAGNOSIS — Z961 Presence of intraocular lens: Secondary | ICD-10-CM | POA: Diagnosis not present

## 2023-03-31 DIAGNOSIS — H353132 Nonexudative age-related macular degeneration, bilateral, intermediate dry stage: Secondary | ICD-10-CM | POA: Diagnosis not present

## 2023-05-01 DIAGNOSIS — K8689 Other specified diseases of pancreas: Secondary | ICD-10-CM | POA: Diagnosis not present

## 2023-05-01 DIAGNOSIS — K838 Other specified diseases of biliary tract: Secondary | ICD-10-CM | POA: Diagnosis not present

## 2023-05-11 ENCOUNTER — Telehealth: Payer: Self-pay

## 2023-05-11 ENCOUNTER — Ambulatory Visit: Payer: PPO | Admitting: Adult Health

## 2023-05-11 DIAGNOSIS — E039 Hypothyroidism, unspecified: Secondary | ICD-10-CM | POA: Diagnosis not present

## 2023-05-11 DIAGNOSIS — R5383 Other fatigue: Secondary | ICD-10-CM | POA: Diagnosis not present

## 2023-05-11 DIAGNOSIS — G4733 Obstructive sleep apnea (adult) (pediatric): Secondary | ICD-10-CM | POA: Diagnosis not present

## 2023-05-11 DIAGNOSIS — I10 Essential (primary) hypertension: Secondary | ICD-10-CM | POA: Diagnosis not present

## 2023-05-11 DIAGNOSIS — I480 Paroxysmal atrial fibrillation: Secondary | ICD-10-CM | POA: Diagnosis not present

## 2023-05-11 DIAGNOSIS — R400 Somnolence: Secondary | ICD-10-CM | POA: Diagnosis not present

## 2023-05-11 NOTE — Telephone Encounter (Signed)
Patient left voicemail on sleep lab phone about PCP wanting him to keep his sleep study appointment - I did not see any recent slp consult appt or slp study orders, looks like he cancelled an office visit with MM for 05/11/23. Last SS orders were from 2021 and last OV completed with MM was 04/2022.

## 2023-05-11 NOTE — Telephone Encounter (Addendum)
Pt had an appt today with MM and cancelled, he hasn't been seen in a yr. Please advise him he will have to schedule and complete FU for sleep study.

## 2023-05-12 NOTE — Telephone Encounter (Signed)
Noted, thank you

## 2023-05-13 ENCOUNTER — Ambulatory Visit: Payer: PPO | Admitting: Cardiology

## 2023-05-13 ENCOUNTER — Encounter: Payer: Self-pay | Admitting: Cardiology

## 2023-05-13 VITALS — BP 118/70 | HR 67 | Resp 16 | Ht 69.0 in | Wt 152.6 lb

## 2023-05-13 DIAGNOSIS — G4733 Obstructive sleep apnea (adult) (pediatric): Secondary | ICD-10-CM | POA: Diagnosis not present

## 2023-05-13 DIAGNOSIS — I10 Essential (primary) hypertension: Secondary | ICD-10-CM | POA: Diagnosis not present

## 2023-05-13 DIAGNOSIS — R0609 Other forms of dyspnea: Secondary | ICD-10-CM

## 2023-05-13 NOTE — Progress Notes (Unsigned)
Primary Physician:  Garlan Fillers, MD   Patient ID: Nicholas [name], male    DOB: 09-19-1941, 82 y.o.   MRN: [id number]  Chief Complaint  Patient presents with   Dyspnea on exertion    HPI   Nicholas [name]  is a 82 y.o. male   with chronic dyspnea on exertion, sleep apnea (on CPAP), dyslipidemia, primary hypertension, stage IIIa chronic kidney disease, Crohn's disease on Aziothioprine, 1 episode of postop SVT/?  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  Presently not on anticoagulation due to no recurrence of atrial fibrillation and patient's preference.   No PND or orthopnea, no leg edema.  Denies any chest pain.   Denies any rapid palpitations.  Today he is asymptomatic.  Past Medical History:  Diagnosis Date   Anemia    PO IRON    Aortic stenosis, mild    Cataract    Colonic hemorrhage 11/10/1962   Crohn's disease (HCC)    GERD (gastroesophageal reflux disease)    Heart murmur    Hyperlipidemia    Macular degeneration    PAC (premature atrial contraction)    Palpitations    Sepsis (HCC) 02/23/2015   SIRS (systemic inflammatory response syndrome) (HCC) 02/23/2015   Sleep apnea, obstructive 08/28/2020   on CPAP   Squamous cell skin cancer     Family History  Problem Relation Age of Onset   Stroke Mother    Multiple myeloma Father    Heart disease Maternal Grandfather    Heart attack Daughter    Congestive Heart Failure Daughter    Heart disease Other        maternal side   Colon cancer Neg Hx    Social History   Tobacco Use   Smoking status: Former    Packs/day: 1.00    Years: 2.00    Additional pack years: 0.00    Total pack years: 2.00    Types: Cigarettes    Quit date: 11/10/1964    Years since quitting: 58.5   Smokeless tobacco: Never  Substance Use Topics   Alcohol use: No    Marital Status: Married  ROS:   Review of Systems   Cardiovascular:  Negative for chest pain, dyspnea on exertion and leg swelling.   Objective:  Blood pressure 118/70, pulse 67, resp. rate 16, height 5\' 9"  (1.753 m), weight 152 lb 9.6 oz (69.2 kg), SpO2 98 %. Body mass index is 22.54 kg/m.     05/13/2023   10:17 AM 11/14/2022    2:32 PM 10/28/2022   11:48 AM  Vitals with BMI  Height 5\' 9"  5\' 9"  5\' 9"   Weight 152 lbs 10 oz 155 lbs 148 lbs  BMI 22.52 22.88 21.85  Systolic 118 133   Diastolic 70 62   Pulse 67 62     Physical Exam Neck:     Vascular: No carotid bruit or JVD.  Cardiovascular:     Rate and Rhythm: Normal rate and regular rhythm.     Pulses: Intact distal pulses.     Heart sounds: Normal heart sounds. No murmur heard.    No gallop.  Pulmonary:     Effort: Pulmonary effort is normal.     Breath sounds: Normal breath sounds.  Abdominal:     General: Bowel sounds are normal.     Palpations: Abdomen is soft.  Musculoskeletal:  Right lower leg: No edema.     Left lower leg: No edema.    Laboratory examination:      Latest Ref Rng & Units 10/28/2022   11:01 AM 03/18/2022   12:02 PM 04/26/2020    3:06 PM  CMP  Glucose 70 - 99 mg/dL 161  96  096   BUN 8 - 23 mg/dL 17  17  13    Creatinine 0.61 - 1.24 mg/dL 0.45  4.09  8.11   Sodium 135 - 145 mmol/L 138  138  139   Potassium 3.5 - 5.1 mmol/L 3.8  4.2  4.5   Chloride 98 - 111 mmol/L 107  110  104   CO2 22 - 32 mmol/L 20  21  24    Calcium 8.9 - 10.3 mg/dL 9.7  9.3  9.5   Total Protein 6.5 - 8.1 g/dL 6.3     Total Bilirubin 0.3 - 1.2 mg/dL 0.9     Alkaline Phos 38 - 126 U/L 74     AST 15 - 41 U/L 33     ALT 0 - 44 U/L 20         Latest Ref Rng & Units 10/28/2022   11:01 AM 03/18/2022   12:02 PM 02/13/2020   12:43 PM  CBC  WBC 4.0 - 10.5 K/uL 7.3  5.2  7.3   Hemoglobin 13.0 - 17.0 g/dL 91.4  78.2  95.6   Hematocrit 39.0 - 52.0 % 38.3  34.1  45.0   Platelets 150 - 400 K/uL 234  222  282    Lipid Panel     Component Value Date/Time   CHOL 60 04/19/2012  0411   TRIG 103 04/19/2012 0411   External labs:  Labs 03/26/2023:  Sodium 138, potassium 4.9, BUN 12, creatinine 0.93, EGFR 81 mL, LFTs normal.  Hb 13.0/HCT 37.7, macrocytic index.  Platelets 200.  Cholesterol, total 139.000 m 11/15/2021 HDL 44.000 mg 11/15/2021 LDL 75.000 mg 11/15/2021 Triglycerides 99.000 mg 11/15/2021  A1C 5.100 % 11/15/2021 TSH 4.040 11/15/2021  Radiology:   MRI abdomen with and without contrast 03/07/2020: 1.  Biliary and pancreatic ductal dilatation with abrupt cutoff of the common bile duct proximal to the ampulla as described above. Subtle periampullary enhancement. Recommend correlation with ERCP, as underlying lesion could have this appearance. 2.  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 3.  Mild hepatic steatosis.  CT abdomen 01/02/2020: 1.  Status post subtotal colectomy with ileorectal anastomosis. No CT evidence of active disease. 2.  Mild biliary ductal fullness with smooth tapering toward the level of the ampulla, nonspecific. Correlate with biliary labs and ERCP if elevated.  3.  Sequelae of chronic pancreatitis with the pancreatic duct ectasia and punctate parenchymal calcifications. Subcentimeter cystic focus within the pancreatic head may reflect a side branch intraductal papillary mucinous neoplasm versus sequelae of pseudocyst. Consider 6-12 month follow-up with pancreas protocol MR.  Chest x-ray PA and lateral view 03/18/2022: Cardiac and mediastinal contours are within normal limits. No focal pulmonary opacity. No pleural effusion or pneumothorax. No acute osseous abnormality.  Cardiac Studies:   PCV MYOCARDIAL PERFUSION WO LEXISCAN 02/29/2020  Narrative Exercise tetrofosmin stress test  02/29/2020: Normal ECG stress. The baseline blood pressure was 172/90 mmHg and increased to 208/80 mmHg, which is a hypertensive response to exercise. The patient exercised for 4 minutes and 45 seconds of a Bruce protocol, achieving  approximately 7 METs. Myocardial perfusion is normal. Overall LV systolic function is normal without regional  wall motion abnormalities. Stress LV EF: 71%. Compared to 09/21/2017, no significant change.  PCV ECHOCARDIOGRAM COMPLETE 04/10/2022  Narrative Echocardiogram 04/10/2022: Left ventricle cavity is normal in size. Mild concentric hypertrophy of the left ventricle. Normal global wall motion. Normal LV systolic function with EF 56%. Normal diastolic filling pattern. No significant valvular abnormality. Normal right atrial pressure. Previous study in 2021 reported mod LVH, mild MR, mild TR-not well appreciated on this study.    PCV CARDIAC STRESS TEST 05/02/2022  Exercise treadmill stress test performed using Bruce protocol.  Patient reached 7 METS, and 107% of age predicted maximum heart rate.  Exercise capacity was fair.  No chest pain reported.  Normal heart rate and hemodynamic response. Stress EKG revealed no ischemic changes. Low risk study.  EKG   EKG 05/13/2023: Normal sinus rhythm at the rate of 66 bpm, incomplete right bundle branch block.  Normal EKG.  Compared to 11/14/2022, no change.   Allergies  No Known Allergies  Current Outpatient Medications:    amLODipine (NORVASC) 2.5 MG tablet, Take 2.5 mg by mouth daily., Disp: , Rfl:    Apoaequorin (PREVAGEN) 10 MG CAPS, Take 10 mg by mouth daily., Disp: , Rfl:    azaTHIOprine (IMURAN) 50 MG tablet, Take 1 tablet by mouth daily., Disp: , Rfl:    ergocalciferol (VITAMIN D2) 50000 UNITS capsule, Take 50,000 Units by mouth once a week. , Disp: , Rfl:    ferrous sulfate 325 (65 FE) MG EC tablet, Take 325 mg by mouth 3 (three) times daily with meals., Disp: , Rfl:    levothyroxine (SYNTHROID) 50 MCG tablet, Take 50 mcg by mouth daily before breakfast., Disp: , Rfl:    loperamide (IMODIUM) 2 MG capsule, Take 2 mg by mouth daily as needed for diarrhea or loose stools., Disp: , Rfl:    losartan (COZAAR) 25 MG tablet, TAKE 2 TABLETS (50  MG) BY MOUTH EVERY EVENING., Disp: 180 tablet, Rfl: 1   Methylcellulose, Laxative, (CITRUCEL PO), Take by mouth as needed., Disp: , Rfl:    Multiple Vitamin (MULTI VITAMIN DAILY PO), Take 1 tablet by mouth daily., Disp: , Rfl:    Multiple Vitamins-Minerals (PRESERVISION AREDS 2) CAPS, Take by mouth. 1 in the am 1 in the pm, Disp: , Rfl:    Assessment:     ICD-10-CM   1. Dyspnea on exertion  R06.09     2. Primary hypertension  I10 EKG 12-Lead    3. OSA on CPAP  G47.33       No orders of the defined types were placed in this encounter.   Medications Discontinued During This Encounter  Medication Reason   Zoster Vaccine Adjuvanted [facility]) injection    This patients CHA2DS2-VASc Score 3 (DM, Age)and yearly risk of stroke 3.2%.   Recommendations:   Nicholas [name]  is a 82 y.o.  with chronic dyspnea on exertion, sleep apnea (on CPAP), dyslipidemia, primary hypertension, stage IIIa chronic kidney disease, Crohn's disease on Aziothioprine, 1 episode of postop SVT/?  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  He has no further documented recurrence and hence not on anticoagulation.  1. Dyspnea on exertion Patient is presently doing well, dyspnea is very minimal and stable.  2. Primary hypertension Blood pressure is well-controlled and no change in his EKG. - EKG 12-Lead  3. OSA on CPAP Patient had discontinued using CPAP, I have discussed with him that he has high  risk of recurrence of atrial fibrillation if he does not use a CPAP.  He will restart using the CPAP, he will also set up an appointment to see his sleep physician to retitrate his machine.  Importance of CPAP discussed with the patient.  Otherwise he remained stable from cardiac standpoint, I will see him back on a as needed basis.  I spent 25 minutes with the patient.  External labs were reviewed.  Overall I again reviewed his stress test and  echocardiogram from previous, reassured him.  I will see him back on a as needed basis.   Nicholas Decamp, MD, [facility] 05/13/2023, 11:27 AM Office: [phone] Fax: [phone] Pager: [phone]

## 2023-05-20 ENCOUNTER — Telehealth: Payer: Self-pay | Admitting: Adult Health

## 2023-05-20 ENCOUNTER — Encounter: Payer: Self-pay | Admitting: Adult Health

## 2023-05-20 NOTE — Telephone Encounter (Signed)
Unable to LVM, no VM box. Sent letter in mail informing pt of need to reschedule 11/05/23 appt - NP out

## 2023-05-21 NOTE — Telephone Encounter (Signed)
Patient has left a voicemail returning your call

## 2023-05-21 NOTE — Telephone Encounter (Signed)
Returned pt's call, pt rescheduled with Megan for 09/15/23 at 10:30am

## 2023-06-09 DIAGNOSIS — K5090 Crohn's disease, unspecified, without complications: Secondary | ICD-10-CM | POA: Diagnosis not present

## 2023-06-09 DIAGNOSIS — E291 Testicular hypofunction: Secondary | ICD-10-CM | POA: Diagnosis not present

## 2023-06-09 DIAGNOSIS — G4733 Obstructive sleep apnea (adult) (pediatric): Secondary | ICD-10-CM | POA: Diagnosis not present

## 2023-06-09 DIAGNOSIS — I739 Peripheral vascular disease, unspecified: Secondary | ICD-10-CM | POA: Diagnosis not present

## 2023-06-09 DIAGNOSIS — K912 Postsurgical malabsorption, not elsewhere classified: Secondary | ICD-10-CM | POA: Diagnosis not present

## 2023-06-09 DIAGNOSIS — I131 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, with stage 1 through stage 4 chronic kidney disease, or unspecified chronic kidney disease: Secondary | ICD-10-CM | POA: Diagnosis not present

## 2023-06-09 DIAGNOSIS — I480 Paroxysmal atrial fibrillation: Secondary | ICD-10-CM | POA: Diagnosis not present

## 2023-07-16 DIAGNOSIS — E291 Testicular hypofunction: Secondary | ICD-10-CM | POA: Diagnosis not present

## 2023-07-20 NOTE — Progress Notes (Unsigned)
PATIENT: Nicholas [name] DOB: Apr 09, 1941  REASON FOR VISIT: follow up HISTORY FROM: patient PRIMARY NEUROLOGIST: Dr. Frances Furbish  No chief complaint on file.    HISTORY OF PRESENT ILLNESS: Today 07/20/23:  Nicholas [name] is a 82 y.o. male with a history of OSA on CPAP. Returns today for follow-up.      05/08/22: [name] is an 82 year old male with a history of obstructive sleep apnea on CPAP.  He returns today for follow-up.  He reports that the CPAP is working fairly well for him.  At the last visit his pressure was reduced to 11 and his AHI has decreased.  He still reports daytime fatigue but has been discussing this with his PCP.  His download is below   REVIEW OF SYSTEMS: Out of a complete 14 system review of symptoms, the patient complains only of the following symptoms, and all other reviewed systems are negative.   ESS 12  ALLERGIES: No Known Allergies  HOME MEDICATIONS: Outpatient Medications Prior to Visit  Medication Sig Dispense Refill   amLODipine (NORVASC) 2.5 MG tablet Take 2.5 mg by mouth daily.     Apoaequorin (PREVAGEN) 10 MG CAPS Take 10 mg by mouth daily.     azaTHIOprine (IMURAN) 50 MG tablet Take 1 tablet by mouth daily.     ergocalciferol (VITAMIN D2) 50000 UNITS capsule Take 50,000 Units by mouth once a week.      ferrous sulfate 325 (65 FE) MG EC tablet Take 325 mg by mouth 3 (three) times daily with meals.     levothyroxine (SYNTHROID) 50 MCG tablet Take 50 mcg by mouth daily before breakfast.     loperamide (IMODIUM) 2 MG capsule Take 2 mg by mouth daily as needed for diarrhea or loose stools.     losartan (COZAAR) 25 MG tablet TAKE 2 TABLETS (50 MG) BY MOUTH EVERY EVENING. 180 tablet 1   Methylcellulose, Laxative, (CITRUCEL PO) Take by mouth as needed.     Multiple Vitamin (MULTI VITAMIN DAILY PO) Take 1 tablet by mouth daily.     Multiple Vitamins-Minerals (PRESERVISION AREDS 2) CAPS Take by mouth. 1 in the am 1 in the pm     No  facility-administered medications prior to visit.    PAST MEDICAL HISTORY: Past Medical History:  Diagnosis Date   Anemia    PO IRON    Aortic stenosis, mild    Cataract    Colonic hemorrhage 11/10/1962   Crohn's disease (HCC)    GERD (gastroesophageal reflux disease)    Heart murmur    Hyperlipidemia    Macular degeneration    PAC (premature atrial contraction)    Palpitations    Sepsis (HCC) 02/23/2015   SIRS (systemic inflammatory response syndrome) (HCC) 02/23/2015   Sleep apnea, obstructive 08/28/2020   on CPAP   Squamous cell skin cancer     PAST SURGICAL HISTORY: Past Surgical History:  Procedure Laterality Date   APPENDECTOMY     Biopsy Facial  05/10/2019   BLADDER REPAIR  1964   intestine leaks/bladder repair   CATARACT EXTRACTION W/ INTRAOCULAR LENS  IMPLANT, BILATERAL     2013   ILEOSTOMY  04/16/2012   Procedure: ILEOSTOMY;  Surgeon: Ernestene Mention, MD;  Location: [facility] OR;  Service: General;  Laterality: N/A;   ILEOSTOMY CLOSURE  12/07/2012   ILEOSTOMY CLOSURE  12/07/2012   Procedure: ILEOSTOMY TAKEDOWN;  Surgeon: Ernestene Mention, MD;  Location: [facility] OR;  Service: General;  Laterality: N/A;  LAPAROTOMY  04/16/2012   Procedure: EXPLORATORY LAPAROTOMY;  Surgeon: Ernestene Mention, MD;  Location: [facility] OR;  Service: General;  Laterality: N/A;   LYSIS OF ADHESION  12/07/2012   Procedure: LYSIS OF ADHESION;  Surgeon: Ernestene Mention, MD;  Location: [facility] OR;  Service: General;  Laterality: N/A;   PROCTOSCOPY  12/07/2012   Procedure: PROCTOSCOPY;  Surgeon: Ernestene Mention, MD;  Location: [facility] OR;  Service: General;;  ILEO PROCTOSCOPY   STOMACH SURGERY  1959   ulcer   TONSILLECTOMY      FAMILY HISTORY: Family History  Problem Relation Age of Onset   Stroke Mother    Multiple myeloma Father    Heart disease Maternal Grandfather    Heart attack Daughter    Congestive Heart Failure Daughter    Heart disease Other        maternal side   Colon cancer Neg Hx      SOCIAL HISTORY: Social History   Socioeconomic History   Marital status: Married    Spouse name: Not on file   Number of children: 3   Years of education: Not on file   Highest education level: Not on file  Occupational History   Occupation: Retired    Associate Professor: [employer]  Tobacco Use   Smoking status: Former    Current packs/day: 0.00    Average packs/day: 1 pack/day for 2.0 years (2.0 ttl pk-yrs)    Types: Cigarettes    Start date: 11/10/1962    Quit date: 11/10/1964    Years since quitting: 58.7   Smokeless tobacco: Never  Vaping Use   Vaping status: Never Used  Substance and Sexual Activity   Alcohol use: No   Drug use: No   Sexual activity: Not on file  Other Topics Concern   Not on file  Social History Narrative   caffeinated drinks less than one a day. Eldest daughter passed from heart deficiency.    Social Determinants of Health   Financial Resource Strain: Not on file  Food Insecurity: Not on file  Transportation Needs: Not on file  Physical Activity: Not on file  Stress: Not on file  Social Connections: Not on file  Intimate Partner Violence: Not on file      PHYSICAL EXAM  There were no vitals filed for this visit.  There is no height or weight on file to calculate BMI.  Generalized: Well developed, in no acute distress  Chest: Lungs clear to auscultation bilaterally  Neurological examination  Mentation: Alert oriented to time, place, history taking. Follows all commands speech and language fluent Cranial nerve II-XII: Extraocular movements were full, visual field were full on confrontational test Head turning and shoulder shrug  were normal and symmetric. Motor: The motor testing reveals 5 over 5 strength of all 4 extremities. Good symmetric motor tone is noted throughout.  Sensory: Sensory testing is intact to soft touch on all 4 extremities. No evidence of extinction is noted.  Gait and station: Gait is normal.    DIAGNOSTIC DATA  (LABS, IMAGING, TESTING) - I reviewed patient records, labs, notes, testing and imaging myself where available.  Lab Results  Component Value Date   WBC 7.3 10/28/2022   HGB 13.0 10/28/2022   HCT 38.3 (L) 10/28/2022   MCV 99.2 10/28/2022   PLT 234 10/28/2022      Component Value Date/Time   NA 138 10/28/2022 1101   NA 139 04/26/2020 1506   K 3.8 10/28/2022 1101   CL  107 10/28/2022 1101   CO2 20 (L) 10/28/2022 1101   GLUCOSE 106 (H) 10/28/2022 1101   BUN 17 10/28/2022 1101   BUN 13 04/26/2020 1506   CREATININE 1.01 10/28/2022 1101   CREATININE 1.06 05/18/2012 1151   CALCIUM 9.7 10/28/2022 1101   PROT 6.3 (L) 10/28/2022 1101   ALBUMIN 3.7 10/28/2022 1101   AST 33 10/28/2022 1101   ALT 20 10/28/2022 1101   ALKPHOS 74 10/28/2022 1101   BILITOT 0.9 10/28/2022 1101   GFRNONAA >60 10/28/2022 1101   GFRAA 61 04/26/2020 1506   Lab Results  Component Value Date   CHOL 60 04/19/2012   TRIG 103 04/19/2012   No results found for: "HGBA1C" No results found for: "VITAMINB12" Lab Results  Component Value Date   TSH 4.641 (H) 12/10/2012      ASSESSMENT AND PLAN 82 y.o. year old male  has a past medical history of Anemia, Aortic stenosis, mild, Cataract, Colonic hemorrhage (11/10/1962), Crohn's disease (HCC), GERD (gastroesophageal reflux disease), Heart murmur, Hyperlipidemia, Macular degeneration, PAC (premature atrial contraction), Palpitations, Sepsis (HCC) (02/23/2015), SIRS (systemic inflammatory response syndrome) (HCC) (02/23/2015), Sleep apnea, obstructive (08/28/2020), and Squamous cell skin cancer. here with:  OSA on CPAP  - CPAP compliance excellent -Residual AHI has improved with the change in pressure. We will keep the pressure the same - Encourage patient to use CPAP nightly and > 4 hours each night - F/U in 1 year or sooner if needed     Butch Penny, MSN, NP-C 07/20/2023, 11:41 AM [facility] [address], [address] [place], [place]  [address] [phone]

## 2023-07-21 ENCOUNTER — Ambulatory Visit: Payer: PPO | Admitting: Adult Health

## 2023-07-21 ENCOUNTER — Encounter: Payer: Self-pay | Admitting: Adult Health

## 2023-07-21 VITALS — BP 117/60 | HR 71 | Ht 69.0 in | Wt 157.0 lb

## 2023-07-21 DIAGNOSIS — G4733 Obstructive sleep apnea (adult) (pediatric): Secondary | ICD-10-CM | POA: Diagnosis not present

## 2023-07-21 NOTE — Patient Instructions (Signed)
Your Plan:  Continue CPAP- order sent for new DME company and pressure change.  Discuss memory with PCP- if they feel that you need a neurological referral we can get you back in with the physician for a formal evaluation.  If your symptoms worsen or you develop new symptoms please let us know.    Thank you for coming to see [place] at [facility]. I hope we have been able to provide you high quality care today.  You may receive a patient satisfaction survey over the next few weeks. We would appreciate your feedback and comments so that we may continue to improve ourselves and the health of our patients.

## 2023-07-30 DIAGNOSIS — G4733 Obstructive sleep apnea (adult) (pediatric): Secondary | ICD-10-CM | POA: Diagnosis not present

## 2023-08-03 ENCOUNTER — Other Ambulatory Visit (HOSPITAL_BASED_OUTPATIENT_CLINIC_OR_DEPARTMENT_OTHER): Payer: Self-pay

## 2023-08-03 MED ORDER — COVID-19 MRNA VAC-TRIS(PFIZER) 30 MCG/0.3ML IM SUSY
0.3000 mL | PREFILLED_SYRINGE | Freq: Once | INTRAMUSCULAR | 0 refills | Status: AC
  Filled 2023-08-03: qty 0.3, 1d supply, fill #0

## 2023-08-03 MED ORDER — INFLUENZA VAC A&B SURF ANT ADJ 0.5 ML IM SUSY
0.5000 mL | PREFILLED_SYRINGE | Freq: Once | INTRAMUSCULAR | 0 refills | Status: AC
  Filled 2023-08-03: qty 0.5, 1d supply, fill #0

## 2023-08-05 ENCOUNTER — Telehealth: Payer: Self-pay | Admitting: *Deleted

## 2023-08-05 NOTE — Telephone Encounter (Signed)
Received fax from [facility] confirmed patient is now established with them as of 07/30/23. Pt's mask is an Airfit F30 Small.

## 2023-08-07 DIAGNOSIS — H5202 Hypermetropia, left eye: Secondary | ICD-10-CM | POA: Diagnosis not present

## 2023-08-07 DIAGNOSIS — H52201 Unspecified astigmatism, right eye: Secondary | ICD-10-CM | POA: Diagnosis not present

## 2023-08-07 DIAGNOSIS — Z961 Presence of intraocular lens: Secondary | ICD-10-CM | POA: Diagnosis not present

## 2023-08-07 DIAGNOSIS — H353132 Nonexudative age-related macular degeneration, bilateral, intermediate dry stage: Secondary | ICD-10-CM | POA: Diagnosis not present

## 2023-09-02 ENCOUNTER — Emergency Department (HOSPITAL_COMMUNITY)
Admission: EM | Admit: 2023-09-02 | Discharge: 2023-09-02 | Disposition: A | Discharge status: Home / Self Care | Payer: PPO | Attending: Emergency Medicine | Admitting: Emergency Medicine

## 2023-09-02 ENCOUNTER — Emergency Department (HOSPITAL_COMMUNITY): Payer: PPO

## 2023-09-02 ENCOUNTER — Encounter (HOSPITAL_COMMUNITY): Payer: Self-pay

## 2023-09-02 ENCOUNTER — Other Ambulatory Visit: Payer: Self-pay

## 2023-09-02 DIAGNOSIS — R0602 Shortness of breath: Secondary | ICD-10-CM | POA: Insufficient documentation

## 2023-09-02 DIAGNOSIS — N189 Chronic kidney disease, unspecified: Secondary | ICD-10-CM | POA: Insufficient documentation

## 2023-09-02 DIAGNOSIS — Z79899 Other long term (current) drug therapy: Secondary | ICD-10-CM | POA: Insufficient documentation

## 2023-09-02 DIAGNOSIS — I129 Hypertensive chronic kidney disease with stage 1 through stage 4 chronic kidney disease, or unspecified chronic kidney disease: Secondary | ICD-10-CM | POA: Insufficient documentation

## 2023-09-02 DIAGNOSIS — Z20822 Contact with and (suspected) exposure to covid-19: Secondary | ICD-10-CM | POA: Insufficient documentation

## 2023-09-02 DIAGNOSIS — R531 Weakness: Secondary | ICD-10-CM | POA: Diagnosis not present

## 2023-09-02 DIAGNOSIS — R918 Other nonspecific abnormal finding of lung field: Secondary | ICD-10-CM | POA: Diagnosis not present

## 2023-09-02 DIAGNOSIS — R0609 Other forms of dyspnea: Secondary | ICD-10-CM | POA: Insufficient documentation

## 2023-09-02 LAB — BASIC METABOLIC PANEL
Anion gap: 6 (ref 5–15)
BUN: 14 mg/dL (ref 8–23)
CO2: 22 mmol/L (ref 22–32)
Calcium: 8.9 mg/dL (ref 8.9–10.3)
Chloride: 108 mmol/L (ref 98–111)
Creatinine, Ser: 1 mg/dL (ref 0.61–1.24)
GFR, Estimated: >60 mL/min (ref >60)
Glucose, Bld: 134 mg/dL — ABNORMAL HIGH (ref 70–99)
Potassium: 4.3 mmol/L (ref 3.5–5.1)
Sodium: 136 mmol/L (ref 135–145)

## 2023-09-02 LAB — CBC WITH DIFFERENTIAL/PLATELET
Abs Immature Granulocytes: 0.01 10*3/uL (ref 0.00–0.07)
Basophils Absolute: 0 10*3/uL (ref 0.0–0.1)
Basophils Relative: 0 %
Eosinophils Absolute: 0 10*3/uL (ref 0.0–0.5)
Eosinophils Relative: 1 %
HCT: 41.8 % (ref 39.0–52.0)
Hemoglobin: 14 g/dL (ref 13.0–17.0)
Immature Granulocytes: 0 %
Lymphocytes Relative: 2 %
Lymphs Abs: 0.2 10*3/uL — ABNORMAL LOW (ref 0.7–4.0)
MCH: 33 pg (ref 26.0–34.0)
MCHC: 33.5 g/dL (ref 30.0–36.0)
MCV: 98.6 fL (ref 80.0–100.0)
Monocytes Absolute: 0.5 10*3/uL (ref 0.1–1.0)
Monocytes Relative: 5 %
Neutro Abs: 7.7 10*3/uL (ref 1.7–7.7)
Neutrophils Relative %: 92 %
Platelets: 214 10*3/uL (ref 150–400)
RBC: 4.24 MIL/uL (ref 4.22–5.81)
RDW: 12.7 % (ref 11.5–15.5)
WBC: 8.4 10*3/uL (ref 4.0–10.5)
nRBC: 0 % (ref 0.0–0.2)

## 2023-09-02 LAB — RESP PANEL BY RT-PCR (RSV, FLU A&B, COVID)  RVPGX2
Influenza A by PCR: NEGATIVE
Influenza B by PCR: NEGATIVE
Resp Syncytial Virus by PCR: NEGATIVE
SARS Coronavirus 2 by RT PCR: NEGATIVE

## 2023-09-02 LAB — BRAIN NATRIURETIC PEPTIDE: B Natriuretic Peptide: 37.4 pg/mL (ref 0.0–100.0)

## 2023-09-02 LAB — TROPONIN I (HIGH SENSITIVITY): Troponin I (High Sensitivity): 6 ng/L (ref <18)

## 2023-09-02 NOTE — ED Notes (Signed)
Pt ambulated with pulse ox. Pt remains at 98% on room air.

## 2023-09-02 NOTE — ED Triage Notes (Signed)
Pt c/o shortness of breath with exertion and generalized weakness for a few weeks but has gotten worse over the past couple of day.

## 2023-09-02 NOTE — ED Provider Notes (Signed)
[place] EMERGENCY DEPARTMENT AT [facility]  Provider Note  CSN: [id number] Arrival date & time: 09/02/23 1526  History Chief Complaint  Patient presents with   Shortness of Breath   Weakness    Nicholas [name] is a 82 y.o. male with history of PAF/AFL, OSA on CPAP, HTN, HLD, CKD, followed by Cardiology for chronic DOE has had increased DOE the last two days yesterday while working in his yard and today when walking to [employer]. He denies any CP, cough or fever. He states he initially went to the local fire department and was told his oxygen was low, but not told a specific number. Not hypoxic with EMS. He reports about 20 years ago he felt similar from a bleeding ulcer/anemia but has not had any blood loss today.    Home Medications Prior to Admission medications   Medication Sig Start Date End Date Taking? Authorizing Provider  amLODipine (NORVASC) 2.5 MG tablet Take 2.5 mg by mouth daily. 07/17/20   [provider]  Apoaequorin (PREVAGEN) 10 MG CAPS Take 10 mg by mouth daily.    [provider]  azaTHIOprine (IMURAN) 50 MG tablet Take 1 tablet by mouth daily. 10/07/21   [provider]  ergocalciferol (VITAMIN D2) 50000 UNITS capsule Take 50,000 Units by mouth once a week.     [provider]  ferrous sulfate 325 (65 FE) MG EC tablet Take 325 mg by mouth 3 (three) times daily with meals.    [provider]  levothyroxine (SYNTHROID) 50 MCG tablet Take 50 mcg by mouth daily before breakfast.    [provider]  loperamide (IMODIUM) 2 MG capsule Take 2 mg by mouth daily as needed for diarrhea or loose stools.    [provider]  losartan (COZAAR) 25 MG tablet TAKE 2 TABLETS (50 MG) BY MOUTH EVERY EVENING. 02/05/22   Cantwell, Celeste C, PA-C  Multiple Vitamin (MULTI VITAMIN DAILY PO) Take 1 tablet by mouth daily.    [provider]  Multiple Vitamins-Minerals (PRESERVISION AREDS 2) CAPS Take by mouth.  1 in the am 1 in the pm    Yates Decamp, MD  testosterone cypionate (DEPOTESTOSTERONE CYPIONATE) 200 MG/ML injection Inject 1 mL into the muscle every 14 (fourteen) days.    [provider]     Allergies    Patient has no known allergies.   Review of Systems   Review of Systems Please see HPI for pertinent positives and negatives  Physical Exam BP (!) 179/87   Pulse 64   Temp (!) 97.5 F (36.4 C)   Resp (!) 22   Ht 5\' 9"  (1.753 m)   Wt 65.8 kg   SpO2 100%   BMI 21.41 kg/m   Physical Exam Vitals and nursing note reviewed.  Constitutional:      Appearance: Normal appearance.  HENT:     Head: Normocephalic and atraumatic.     Nose: Nose normal.     Mouth/Throat:     Mouth: Mucous membranes are moist.  Eyes:     Extraocular Movements: Extraocular movements intact.     Conjunctiva/sclera: Conjunctivae normal.  Cardiovascular:     Rate and Rhythm: Normal rate and regular rhythm.  Pulmonary:     Effort: Pulmonary effort is normal.     Breath sounds: Normal breath sounds. No wheezing or rhonchi.  Abdominal:     General: Abdomen is flat.     Palpations: Abdomen is soft.  Tenderness: There is no abdominal tenderness.  Musculoskeletal:        General: No swelling. Normal range of motion.     Cervical back: Neck supple.     Right lower leg: No edema.     Left lower leg: No edema.  Skin:    General: Skin is warm and dry.  Neurological:     General: No focal deficit present.     Mental Status: He is alert.  Psychiatric:        Mood and Affect: Mood normal.     ED Results / Procedures / Treatments   EKG EKG Interpretation Date/Time:  Wednesday September 02 2023 15:53:17 EDT Ventricular Rate:  71 PR Interval:  139 QRS Duration:  84 QT Interval:  364 QTC Calculation: 396 R Axis:   14  Text Interpretation: Sinus rhythm Abnormal R-wave progression, early transition No significant change since last tracing Confirmed by Susy Frizzle [phone]) on 09/02/2023  3:58:28 PM  Procedures Procedures  Medications Ordered in the ED Medications - No data to display  Initial Impression and Plan  Patient here for dyspnea on exertion, no CP, no signs of fluid overload. Will check basic labs, CXR. Vitals and exam are reassuring while at rest. EKG is NSR.   ED Course   Clinical Course as of 09/02/23 2006  Wed Sep 02, 2023  1709 CBC is normal, no anemia.  [CS]  1827 Covid/Flu/RSV swab is negative [CS]  1840 BNP is normal. [CS]  1852 BMP and Trop are normal.  [CS]  1942 I personally viewed the images from radiology studies and agree with radiologist interpretation: CXR without acute findings.  [CS]  2002 Patient continues to rest comfortably. Ambulates without hypoxia and without significant dyspnea. No signs of CHF, PNA, pleural effusion, no concern for PE or ACS as a cause. Recommend he continue with outpatient management with cardiology. RTED for any other concerns.  [CS]    Clinical Course User Index [CS] Pollyann Savoy, MD     MDM Rules/Calculators/A&P Medical Decision Making Given presenting complaint, I considered that admission might be necessary. After review of results from ED lab and/or imaging studies, admission to the hospital is not indicated at this time.    Problems Addressed: DOE (dyspnea on exertion): chronic illness or injury with exacerbation, progression, or side effects of treatment  Amount and/or Complexity of Data Reviewed Labs: ordered. Decision-making details documented in ED Course. Radiology: ordered and independent interpretation performed. Decision-making details documented in ED Course. ECG/medicine tests: ordered and independent interpretation performed. Decision-making details documented in ED Course.  Risk Decision regarding hospitalization.     Final Clinical Impression(s) / ED Diagnoses Final diagnoses:  DOE (dyspnea on exertion)    Rx / DC Orders ED Discharge Orders     None         Pollyann Savoy, MD 09/02/23 2006

## 2023-09-07 ENCOUNTER — Ambulatory Visit: Payer: PPO | Attending: Physician Assistant | Admitting: Physician Assistant

## 2023-09-07 ENCOUNTER — Encounter: Payer: Self-pay | Admitting: Physician Assistant

## 2023-09-07 VITALS — BP 134/60 | HR 53 | Ht 69.0 in | Wt 158.0 lb

## 2023-09-07 DIAGNOSIS — I10 Essential (primary) hypertension: Secondary | ICD-10-CM | POA: Diagnosis not present

## 2023-09-07 DIAGNOSIS — R5383 Other fatigue: Secondary | ICD-10-CM

## 2023-09-07 DIAGNOSIS — G4733 Obstructive sleep apnea (adult) (pediatric): Secondary | ICD-10-CM

## 2023-09-07 DIAGNOSIS — I480 Paroxysmal atrial fibrillation: Secondary | ICD-10-CM

## 2023-09-07 DIAGNOSIS — R0609 Other forms of dyspnea: Secondary | ICD-10-CM | POA: Diagnosis not present

## 2023-09-07 NOTE — Patient Instructions (Addendum)
Medication Instructions:  NO CHANGES *If you need a refill on your cardiac medications before your next appointment, please call your pharmacy*   Lab Work: TSH AND FREE T4 TODAY. If you have labs (blood work) drawn today and your tests are completely normal, you will receive your results only by: MyChart Message (if you have MyChart) OR A paper copy in the mail If you have any lab test that is abnormal or we need to change your treatment, we will call you to review the results.   Testing/Procedures:[address] Your physician has requested that you have an echocardiogram. Echocardiography is a painless test that uses sound waves to create images of your heart. It provides your doctor with information about the size and shape of your heart and how well your heart's chambers and valves are working. This procedure takes approximately one hour. There are no restrictions for this procedure. Please do NOT wear cologne, perfume, aftershave, or lotions (deodorant is allowed). Please arrive 15 minutes prior to your appointment time.     Your physician has requested that you have cardiac CT. Cardiac computed tomography (CT) is a painless test that uses an x-ray machine to take clear, detailed pictures of your heart. For further information please visit [email or web address] Please follow instruction sheet as given.  This testing is completed at [facility] [address].  Please arrive 30 minutes prior to your scheduled time.  The schedulers will call you to get this scheduled. Please follow further testing instructions below.    Follow-Up: At [facility], you and your health needs are our priority.  As part of our continuing mission to provide you with exceptional heart care, we have created designated Provider Care Teams.  These Care Teams include your primary Cardiologist (physician) and Advanced Practice Providers (APPs -  Physician Assistants and Nurse  Practitioners) who all work together to provide you with the care you need, when you need it.  Your next appointment:   6 week(s)  Provider:   Yates Decamp, MD     Other Instructions   Your cardiac CT will be scheduled at one of the below locations:   [facility] [address] [place], [place] [address] [phone]  If scheduled at [facility], please arrive at the [facility]tr[facility] (Entrance C2) of [facility] 30 minutes prior to test start time. You can use the FREE valet parking offered at entrance C (encouraged to control the heart rate for the test)  Proceed to the [facility] Radiology Department (first floor) to check-in and test prep.  All radiology patients and guests should use entrance C2 at [facility], accessed from [facility], even though the hospital's physical address listed is [address].     Please follow these instructions carefully (unless otherwise directed):  An IV will be required for this test and Nitroglycerin will be given.  Hold all erectile dysfunction medications at least 3 days (72 hrs) prior to test. (Ie viagra, cialis, sildenafil, tadalafil, etc)   On the Night Before the Test: Be sure to Drink plenty of water. Do not consume any caffeinated/decaffeinated beverages or chocolate 12 hours prior to your test. Do not take any antihistamines 12 hours prior to your test. If the patient has contrast allergy: Patient will need a prescription for Prednisone and very clear instructions (as follows): Prednisone 50 mg - take 13 hours prior to test Take another  Prednisone 50 mg 7 hours prior to test Take another Prednisone 50 mg 1 hour prior to test Take Benadryl 50 mg 1 hour prior to test Patient must complete all four doses of above prophylactic medications. Patient will need a ride after test due to Benadryl.  On the Day of the Test: Drink plenty of water until 1 hour prior  to the test. Do not eat any food 1 hour prior to test. You may take your regular medications prior to the test.  Take metoprolol (Lopressor) two hours prior to test. If you take Furosemide/Hydrochlorothiazide/Spironolactone, please HOLD on the morning of the test. FEMALES- please wear underwire-free bra if available, avoid dresses & tight clothing      After the Test: Drink plenty of water. After receiving IV contrast, you may experience a mild flushed feeling. This is normal. On occasion, you may experience a mild rash up to 24 hours after the test. This is not dangerous. If this occurs, you can take Benadryl 25 mg and increase your fluid intake. If you experience trouble breathing, this can be serious. If it is severe call 911 IMMEDIATELY. If it is mild, please call our office. If you take any of these medications: Glipizide/Metformin, Avandament, Glucavance, please do not take 48 hours after completing test unless otherwise instructed.  We will call to schedule your test 2-4 weeks out understanding that some insurance companies will need an authorization prior to the service being performed.   For more information and frequently asked questions, please visit our website : [email or web address]  For non-scheduling related questions, please contact the cardiac imaging nurse navigator should you have any questions/concerns: Cardiac Imaging Nurse Navigators Direct Office Dial: [phone]   For scheduling needs, including cancellations and rescheduling, please call [place], [phone].

## 2023-09-07 NOTE — Progress Notes (Signed)
Cardiology Office Note:  .   Date:  09/07/2023  ID:  SKLYER BAGE, DOB 1941/01/29, MRN [id number] PCP: Garlan Fillers, MD  [facility] Providers Cardiologist:  Yates Decamp, MD     History of Present Illness: Nicholas [name] is a 82 y.o. male with past medical history of chronic dyspnea on exertion, OSA on CPAP (managed by neurology), hyperlipidemia, hypertension, Crohn's disease on azathioprine s/p partial colectomy, 1 episode of postop SVT/?atrial flutter with aberrancy in January 2014, remote history of A-fib in 2004 without recurrence.  He is not on anticoagulation therapy due to lack of recurrence and the patient's preference.  Myoview obtained on 02/29/2020 showed EF 71%, normal perfusion, patient was able to achieve 7 METS of activity.  Echocardiogram obtained on 04/10/2022 showed EF 56%, mild LVH, no significant valvular abnormality.  Repeat exercise treadmill test on 05/02/2022 shows no ischemic EKG changes, patient was able to achieve 7 METS of activity, overall low risk study.   More recently, patient was seen in the ED on 09/02/2019 for due to increased dyspnea on exertion.  He went to the local fire department and was told his O2 saturation was low but not a specific number.  He was not hypoxic with EMS.  Blood work shows stable renal function and electrolyte.  Normal hemoglobin.  Troponin negative.  Viral panel was negative for influenza, RSV and COVID.  Chest x-ray showed improved opacity of the left lung base compared to previous x-ray in December 2023, possible trace bilateral pleural effusion.  Patient presents today for evaluation of dyspnea on exertion.  He says the dyspnea on exertion has been dramatically worsened in the past 2 weeks.  He also complains of fatigue during any activity.  Blood work shows no sign of anemia or electrolyte imbalance or dehydration.  I will obtain a TSH and a free T4 to rule out thyroid issue.  For his dyspnea on exertion, I am concerned of  potential cardiac cause.  Will order echocardiogram and a coronary CT to further assess.  If normal, would not recommend any further workup.  Recent EKG has been reviewed, there was no significant ST-T wave changes.  I plan to reassess the patient in 6 weeks.  I did not order metoprolol for the coronary CTA as patient's heart rate was in the low 50s during office visit.  ROS:   Patient complains of dyspnea on exertion and fatigue.  He denies any chest pain  Studies Reviewed: .        Cardiac Studies & Procedures       ECHOCARDIOGRAM  PCV ECHOCARDIOGRAM COMPLETE 04/10/2022  Narrative Echocardiogram 04/10/2022: Left ventricle cavity is normal in size. Mild concentric hypertrophy of the left ventricle. Normal global wall motion. Normal LV systolic function with EF 56%. Normal diastolic filling pattern. No significant valvular abnormality. Normal right atrial pressure. Previous study in 2021 reported mod LVH, mild MR, mild TR-not well appreciated on this study.             Risk Assessment/Calculations:             Physical Exam:   VS:  BP 134/60 (BP Location: Left Arm, Patient Position: Sitting, Cuff Size: Normal)   Pulse (!) 53   Ht 5\' 9"  (1.753 m)   Wt 158 lb (71.7 kg)   SpO2 99%   BMI 23.33 kg/m    Wt Readings from Last 3 Encounters:  09/07/23 158 lb (71.7 kg)  09/02/23 145  lb (65.8 kg)  07/21/23 157 lb (71.2 kg)    GEN: Well nourished, well developed in no acute distress NECK: No JVD; No carotid bruits CARDIAC: RRR, no murmurs, rubs, gallops RESPIRATORY:  Clear to auscultation without rales, wheezing or rhonchi  ABDOMEN: Soft, non-tender, non-distended EXTREMITIES:  No edema; No deformity   ASSESSMENT AND PLAN: .    Exertional Dyspnea New onset over the past few months, worsening over time. No associated chest pain or dizziness. No hypoxia noted. Recent ED visit with negative workup including negative troponin, normal hemoglobin, electrolytes, and renal function. Chest  X-ray showed improved opacity at lung base compared to previous imaging. -Order TSH and free T4 to assess thyroid function. -If thyroid function is normal, proceed with echocardiogram and coronary CT to evaluate for cardiac causes of dyspnea.  Heart rate in the low 50s, no need for beta-blocker prior to coronary CT  Fatigue: On levothyroxine, check fasting lipid panel and LFT to rule out significant thyroid issue  Obstructive Sleep Apnea (OSA) On CPAP. -Continue current management.  Hypertension -Blood pressure well-controlled  History of SVT/Atrial Flutter No recurrence since 2014. Not on anticoagulation due to patient preference. -Continue current management.       Dispo: Follow-up in 6 weeks  Signed, Azalee Course, PA

## 2023-09-08 LAB — TSH+FREE T4
Free T4: 0.99 ng/dL (ref 0.82–1.77)
TSH: 4.55 u[IU]/mL — ABNORMAL HIGH (ref 0.450–4.500)

## 2023-09-10 DIAGNOSIS — K838 Other specified diseases of biliary tract: Secondary | ICD-10-CM | POA: Diagnosis not present

## 2023-09-10 DIAGNOSIS — K50813 Crohn's disease of both small and large intestine with fistula: Secondary | ICD-10-CM | POA: Diagnosis not present

## 2023-09-10 DIAGNOSIS — D490 Neoplasm of unspecified behavior of digestive system: Secondary | ICD-10-CM | POA: Diagnosis not present

## 2023-09-15 ENCOUNTER — Ambulatory Visit: Payer: PPO | Admitting: Adult Health

## 2023-09-18 ENCOUNTER — Telehealth (HOSPITAL_COMMUNITY): Payer: Self-pay | Admitting: *Deleted

## 2023-09-18 NOTE — Telephone Encounter (Signed)
Reaching out to patient to offer assistance regarding upcoming cardiac imaging study; pt verbalizes understanding of appt date/time, parking situation and where to check in, pre-test NPO status and medications ordered, and verified current allergies; name and call back number provided for further questions should they arise Hayley Sharpe RN Navigator Cardiac Imaging [facility] [phone] office [phone] cell  

## 2023-09-21 ENCOUNTER — Ambulatory Visit (HOSPITAL_BASED_OUTPATIENT_CLINIC_OR_DEPARTMENT_OTHER)
Admission: RE | Admit: 2023-09-21 | Discharge: 2023-09-21 | Disposition: A | Discharge status: Home / Self Care | Payer: PPO | Source: Ambulatory Visit | Attending: Cardiology | Admitting: Cardiology

## 2023-09-21 ENCOUNTER — Other Ambulatory Visit: Payer: Self-pay | Admitting: Cardiology

## 2023-09-21 ENCOUNTER — Ambulatory Visit (HOSPITAL_COMMUNITY)
Admission: RE | Admit: 2023-09-21 | Discharge: 2023-09-21 | Disposition: A | Discharge status: Home / Self Care | Payer: PPO | Source: Ambulatory Visit | Attending: Physician Assistant | Admitting: Physician Assistant

## 2023-09-21 DIAGNOSIS — R0609 Other forms of dyspnea: Secondary | ICD-10-CM | POA: Insufficient documentation

## 2023-09-21 DIAGNOSIS — I2510 Atherosclerotic heart disease of native coronary artery without angina pectoris: Secondary | ICD-10-CM | POA: Diagnosis not present

## 2023-09-21 DIAGNOSIS — R931 Abnormal findings on diagnostic imaging of heart and coronary circulation: Secondary | ICD-10-CM | POA: Insufficient documentation

## 2023-09-21 DIAGNOSIS — Q2112 Patent foramen ovale: Secondary | ICD-10-CM | POA: Insufficient documentation

## 2023-09-21 DIAGNOSIS — R5383 Other fatigue: Secondary | ICD-10-CM | POA: Diagnosis not present

## 2023-09-21 DIAGNOSIS — I517 Cardiomegaly: Secondary | ICD-10-CM | POA: Diagnosis not present

## 2023-09-21 MED ORDER — NITROGLYCERIN 0.4 MG SL SUBL
SUBLINGUAL_TABLET | SUBLINGUAL | Status: AC
  Filled 2023-09-21: qty 2

## 2023-09-21 MED ORDER — NITROGLYCERIN 0.4 MG SL SUBL
0.8000 mg | SUBLINGUAL_TABLET | Freq: Once | SUBLINGUAL | Status: AC
  Administered 2023-09-21: 0.8 mg via SUBLINGUAL

## 2023-09-21 MED ORDER — IOPAMIDOL (ISOVUE-370) INJECTION 76%
100.0000 mL | Freq: Once | INTRAVENOUS | Status: AC | PRN
  Administered 2023-09-21: 100 mL via INTRAVENOUS

## 2023-09-21 NOTE — Progress Notes (Signed)
Patient tolerated CT well.  Vital signs stable encourage to drink water throughout day.Reasons explained and verbalized understanding.   

## 2023-09-23 ENCOUNTER — Telehealth: Payer: Self-pay | Admitting: Cardiology

## 2023-09-23 NOTE — Telephone Encounter (Signed)
Patient's wife is calling in regards to CT Morph results. Please advise.

## 2023-09-23 NOTE — Telephone Encounter (Signed)
Nicholas [name], [place] 09/21/2023  3:44 PM EST     Reassuring test, although Nicholas [name] does have some mild to moderate coronary artery disease, however FFR analysis shows no sign of significant blockage. Follow up with Dr. Jacinto Halim as previously scheduled in December.    I spoke with patient's wife and reviewed CT results with her

## 2023-09-29 DIAGNOSIS — L814 Other melanin hyperpigmentation: Secondary | ICD-10-CM | POA: Diagnosis not present

## 2023-09-29 DIAGNOSIS — D225 Melanocytic nevi of trunk: Secondary | ICD-10-CM | POA: Diagnosis not present

## 2023-09-29 DIAGNOSIS — L578 Other skin changes due to chronic exposure to nonionizing radiation: Secondary | ICD-10-CM | POA: Diagnosis not present

## 2023-09-29 DIAGNOSIS — L570 Actinic keratosis: Secondary | ICD-10-CM | POA: Diagnosis not present

## 2023-09-29 DIAGNOSIS — Z08 Encounter for follow-up examination after completed treatment for malignant neoplasm: Secondary | ICD-10-CM | POA: Diagnosis not present

## 2023-09-29 DIAGNOSIS — Z85828 Personal history of other malignant neoplasm of skin: Secondary | ICD-10-CM | POA: Diagnosis not present

## 2023-09-29 DIAGNOSIS — L821 Other seborrheic keratosis: Secondary | ICD-10-CM | POA: Diagnosis not present

## 2023-09-29 DIAGNOSIS — Z872 Personal history of diseases of the skin and subcutaneous tissue: Secondary | ICD-10-CM | POA: Diagnosis not present

## 2023-10-02 ENCOUNTER — Ambulatory Visit (HOSPITAL_COMMUNITY): Payer: PPO | Attending: Physician Assistant

## 2023-10-02 DIAGNOSIS — R0609 Other forms of dyspnea: Secondary | ICD-10-CM

## 2023-10-02 DIAGNOSIS — R5383 Other fatigue: Secondary | ICD-10-CM | POA: Diagnosis not present

## 2023-10-02 LAB — ECHOCARDIOGRAM COMPLETE
Area-P 1/2: 3.68 cm2
S' Lateral: 2.7 cm

## 2023-10-21 ENCOUNTER — Encounter: Payer: Self-pay | Admitting: Cardiology

## 2023-10-21 ENCOUNTER — Ambulatory Visit: Payer: PPO | Attending: Cardiology | Admitting: Cardiology

## 2023-10-21 VITALS — BP 123/71 | HR 76 | Resp 16 | Ht 69.0 in | Wt 159.0 lb

## 2023-10-21 DIAGNOSIS — I2510 Atherosclerotic heart disease of native coronary artery without angina pectoris: Secondary | ICD-10-CM

## 2023-10-21 DIAGNOSIS — R0609 Other forms of dyspnea: Secondary | ICD-10-CM

## 2023-10-21 DIAGNOSIS — G4739 Other sleep apnea: Secondary | ICD-10-CM

## 2023-10-21 MED ORDER — ASPIRIN 81 MG PO CHEW: 81.0000 mg | CHEWABLE_TABLET | Freq: Every day | ORAL | Status: DC

## 2023-10-21 MED ORDER — ROSUVASTATIN CALCIUM 5 MG PO TABS: 5.0000 mg | ORAL_TABLET | Freq: Every day | ORAL | 0 refills | Status: DC

## 2023-10-21 NOTE — Patient Instructions (Signed)
Medication Instructions:  Follow your current medications as listed.  *If you need a refill on your cardiac medications before your next appointment, please call your pharmacy*  Testing/Procedures: Your physician has recommended that you have a sleep study. This test records several body functions during sleep, including: brain activity, eye movement, oxygen and carbon dioxide blood levels, heart rate and rhythm, breathing rate and rhythm, the flow of air through your mouth and nose, snoring, body muscle movements, and chest and belly movement. You will be contacted to be scheduled.   Follow-Up: At [facility], you and your health needs are our priority.  As part of our continuing mission to provide you with exceptional heart care, we have created designated Provider Care Teams.  These Care Teams include your primary Cardiologist (physician) and Advanced Practice Providers (APPs -  Physician Assistants and Nurse Practitioners) who all work together to provide you with the care you need, when you need it.  We recommend signing up for the patient portal called "MyChart".  Sign up information is provided on this After Visit Summary.  MyChart is used to connect with patients for Virtual Visits (Telemedicine).  Patients are able to view lab/test results, encounter notes, upcoming appointments, etc.  Non-urgent messages can be sent to your provider as well.   To learn more about what you can do with MyChart, go to [email or web address].    Your next appointment:   Follow up as needed with Dr Jacinto Halim

## 2023-10-21 NOTE — Progress Notes (Signed)
Cardiology Office Note:  .   Date:  10/21/2023  ID:  Nicholas [name], DOB January 02, 1941, MRN [id number] PCP: Garlan Fillers, MD  [facility] Providers Cardiologist:  Yates Decamp, MD   History of Present Illness: Nicholas [name] is a 82 y.o. male with chronic dyspnea on exertion, OSA on CPAP (managed by neurology), hyperlipidemia, hypertension, Crohn's disease on azathioprine s/p partial colectomy in 1980s, 1 episode of postop SVT/?atrial flutter with aberrancy in January 2014, remote history of A-fib in 2004 without recurrence.    Patient has been experiencing recent worsening dyspnea on exertion and was evaluated by me 2 months ago, underwent coronary CTA and echocardiogram and presents for follow-up. Discussed the use of AI scribe software for clinical note transcription with the patient, who gave verbal consent to proceed.  History of Present Illness   The patient, an 82 year old individual, presents with worsening shortness of breath and fluctuating energy levels over the past two to three months. The patient describes some days as feeling fairly good, while on others, he experiences a significant lack of energy, which he cannot attribute to any specific cause. The patient enjoys light yard work, such as tending to the trees in his yard, but finds it increasingly difficult to muster the energy for these activities.  The patient has a history of sleep apnea and was previously using a CPAP machine. However, he discontinued its use approximately six months ago, as he felt better without it and experienced unrestful sleep with it. The patient reports feeling like the machine was "fighting" him and causing discomfort.  The patient also has some blockages in his heart, which have been identified as not significant and manageable with medications. The patient is not currently on any cholesterol medication.      Review of Systems  Constitutional: Positive for malaise/fatigue (sudden  onset and unexplained).  Cardiovascular:  Positive for dyspnea on exertion. Negative for chest pain and leg swelling.    Labs   Lab Results  Component Value Date   NA 136 09/02/2023   K 4.3 09/02/2023   CO2 22 09/02/2023   GLUCOSE 134 (H) 09/02/2023   BUN 14 09/02/2023   CREATININE 1.00 09/02/2023   CALCIUM 8.9 09/02/2023   GFRNONAA >60 09/02/2023      Latest Ref Rng & Units 09/02/2023    4:16 PM 10/28/2022   11:01 AM 03/18/2022   12:02 PM  BMP  Glucose 70 - 99 mg/dL 409  811  96   BUN 8 - 23 mg/dL 14  17  17    Creatinine 0.61 - 1.24 mg/dL 9.14  7.82  9.56   Sodium 135 - 145 mmol/L 136  138  138   Potassium 3.5 - 5.1 mmol/L 4.3  3.8  4.2   Chloride 98 - 111 mmol/L 108  107  110   CO2 22 - 32 mmol/L 22  20  21    Calcium 8.9 - 10.3 mg/dL 8.9  9.7  9.3       Latest Ref Rng & Units 09/02/2023    4:16 PM 10/28/2022   11:01 AM 03/18/2022   12:02 PM  CBC  WBC 4.0 - 10.5 K/uL 8.4  7.3  5.2   Hemoglobin 13.0 - 17.0 g/dL 21.3  08.6  57.8   Hematocrit 39.0 - 52.0 % 41.8  38.3  34.1   Platelets 150 - 400 K/uL 214  234  222    External Labs:   Cholesterol, total 139.000  m 11/15/2021 HDL 44.000 mg 11/15/2021 LDL 75.000 mg 11/15/2021 Triglycerides 99.000 mg 11/15/2021  Physical Exam:   VS:  BP 123/71 (BP Location: Left Arm, Patient Position: Sitting, Cuff Size: Normal)   Pulse 76   Resp 16   Ht 5\' 9"  (1.753 m)   Wt 159 lb (72.1 kg)   SpO2 94%   BMI 23.48 kg/m    Wt Readings from Last 3 Encounters:  10/21/23 159 lb (72.1 kg)  09/07/23 158 lb (71.7 kg)  09/02/23 145 lb (65.8 kg)     Physical Exam Neck:     Vascular: No carotid bruit or JVD.  Cardiovascular:     Rate and Rhythm: Normal rate and regular rhythm.     Pulses: Intact distal pulses.     Heart sounds: Normal heart sounds. No murmur heard.    No gallop.  Pulmonary:     Effort: Pulmonary effort is normal.     Breath sounds: Normal breath sounds.  Abdominal:     General: Bowel sounds are normal.     Palpations:  Abdomen is soft.  Musculoskeletal:     Right lower leg: No edema.     Left lower leg: No edema.     Studies Reviewed: [name]    Sleep study 08/28/2020: 1. Primary Central Sleep Apnea  2. Obstructive Sleep Apnea (OSA)  3. Periodic Limb Movement Disorder (PLMD)  4. Dysfunctions associated with sleep stages or arousal from sleep    ECHOCARDIOGRAM COMPLETE 10/02/2023  1. Left ventricular ejection fraction, by estimation, is 55 to 60%. The left ventricle has normal function. The left ventricle has no regional wall motion abnormalities. Left ventricular diastolic parameters are consistent with Grade I diastolic dysfunction (impaired relaxation). The average left ventricular global longitudinal strain is -19.1 %. The global longitudinal strain is normal. 2. Right ventricular systolic function is normal. The right ventricular size is normal. 3. The mitral valve is normal in structure. Trivial mitral valve regurgitation. No evidence of mitral stenosis. 4. The aortic valve is tricuspid. Aortic valve regurgitation is not visualized. No aortic stenosis is present. 5. The inferior vena cava is normal in size with greater than 50% respiratory variability, suggesting right atrial pressure of 3 mmHg.  Coronary CTA 09/21/2023: 1. Coronary calcium score of 987. This was 63rd percentile for age and sex matched control.  2. Total plaque volume 647mm3 which is 38th percentile for age and sex-matched controls (calcified plaque 233mm3; noncalcified plaque 461mm3). TPV is severe 3.  Normal coronary origin with right dominance. 4.  Moderate (50-69%) stenosis in proximal LAD.  CT FFR 0.82, not hemodynamically significant. 5.  Mild (25-49%) stenosis in proximal/mid RCA and proximal LCX.  No hemodynamically significant stenosis. 6.  Minimal (0-24%) stenosis in left main, mid LAD, and mid LCX 7.  PFO 8. No significant extracardiac findings within the visualized chest.   EKG:   EKG 05/13/2023: Normal sinus rhythm at  the rate of 66 bpm, incomplete right bundle branch block. Normal EKG.  Medications and allergies    No Known Allergies   Current Outpatient Medications:    amLODipine (NORVASC) 2.5 MG tablet, Take 2.5 mg by mouth daily., Disp: , Rfl:    aspirin (ASPIRIN CHILDRENS) 81 MG chewable tablet, Chew 1 tablet (81 mg total) by mouth daily., Disp: , Rfl:    azaTHIOprine (IMURAN) 50 MG tablet, Take 1 tablet by mouth daily., Disp: , Rfl:    ergocalciferol (VITAMIN D2) 50000 UNITS capsule, Take 50,000 Units by mouth once a week. ,  Disp: , Rfl:    levothyroxine (SYNTHROID) 50 MCG tablet, Take 50 mcg by mouth daily before breakfast., Disp: , Rfl:    loperamide (IMODIUM) 2 MG capsule, Take 2 mg by mouth daily as needed for diarrhea or loose stools., Disp: , Rfl:    losartan (COZAAR) 25 MG tablet, TAKE 2 TABLETS (50 MG) BY MOUTH EVERY EVENING., Disp: 180 tablet, Rfl: 1   Multiple Vitamin (MULTI VITAMIN DAILY PO), Take 1 tablet by mouth daily., Disp: , Rfl:    Multiple Vitamins-Minerals (PRESERVISION AREDS 2) CAPS, Take by mouth. 1 in the am 1 in the pm, Disp: , Rfl:    rosuvastatin (CRESTOR) 5 MG tablet, Take 1 tablet (5 mg total) by mouth daily., Disp: 90 tablet, Rfl: 0   testosterone cypionate (DEPOTESTOSTERONE CYPIONATE) 200 MG/ML injection, Inject 1 mL into the muscle every 14 (fourteen) days., Disp: , Rfl:    ASSESSMENT AND PLAN: .      ICD-10-CM   1. Dyspnea on exertion  R06.09     2. 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 Normal cardiac function on echocardiogram and non-significant blockages on coronary CT angiogram. -Check blood pressure on days of low  energy, both lying down and standing up.  Coronary Artery Disease Mild plaque in coronary arteries, 63rd percentile for age group.  Also has a 50 to 69% stenosis in the proximal LAD by coronary CT angiogram.  FFR negative.  No need for invasive intervention. -Start Rosuvastatin 5mg  daily for primary/secondary prevention although his lipids are normal. -Start Aspirin 81mg  daily for platelet inhibition.  Sleep Apnea Central and obstructive sleep apnea diagnosed in 2021. Patient reports difficulty with current CPAP machine. -Refer to Dr. Frances Furbish for sleep study and possible adjustment to Autopap setting.  Follow-up as needed.   Signed,  Yates Decamp, MD, [facility] 10/21/2023, 5:57 PM [facility] [address] [place], [place] [address] Phone: [phone]. Fax:  [phone]

## 2023-11-05 ENCOUNTER — Ambulatory Visit: Payer: PPO | Admitting: Adult Health

## 2023-11-10 DIAGNOSIS — H353132 Nonexudative age-related macular degeneration, bilateral, intermediate dry stage: Secondary | ICD-10-CM | POA: Diagnosis not present

## 2023-11-27 ENCOUNTER — Other Ambulatory Visit: Payer: Self-pay | Admitting: Cardiology

## 2023-11-27 DIAGNOSIS — I2510 Atherosclerotic heart disease of native coronary artery without angina pectoris: Secondary | ICD-10-CM

## 2023-12-02 DIAGNOSIS — L570 Actinic keratosis: Secondary | ICD-10-CM | POA: Diagnosis not present

## 2023-12-11 ENCOUNTER — Other Ambulatory Visit: Payer: Self-pay

## 2023-12-11 ENCOUNTER — Encounter (HOSPITAL_COMMUNITY): Payer: Self-pay

## 2023-12-11 ENCOUNTER — Emergency Department (HOSPITAL_COMMUNITY): Payer: PPO

## 2023-12-11 ENCOUNTER — Emergency Department (HOSPITAL_COMMUNITY)
Admission: EM | Admit: 2023-12-11 | Discharge: 2023-12-12 | Disposition: A | Discharge status: Home / Self Care | Payer: PPO | Attending: Emergency Medicine | Admitting: Emergency Medicine

## 2023-12-11 DIAGNOSIS — R197 Diarrhea, unspecified: Secondary | ICD-10-CM | POA: Diagnosis not present

## 2023-12-11 DIAGNOSIS — R0602 Shortness of breath: Secondary | ICD-10-CM | POA: Diagnosis not present

## 2023-12-11 DIAGNOSIS — R5383 Other fatigue: Secondary | ICD-10-CM | POA: Diagnosis not present

## 2023-12-11 DIAGNOSIS — R531 Weakness: Secondary | ICD-10-CM | POA: Insufficient documentation

## 2023-12-11 DIAGNOSIS — R350 Frequency of micturition: Secondary | ICD-10-CM | POA: Insufficient documentation

## 2023-12-11 DIAGNOSIS — R946 Abnormal results of thyroid function studies: Secondary | ICD-10-CM | POA: Insufficient documentation

## 2023-12-11 DIAGNOSIS — Z7982 Long term (current) use of aspirin: Secondary | ICD-10-CM | POA: Diagnosis not present

## 2023-12-11 DIAGNOSIS — Z20822 Contact with and (suspected) exposure to covid-19: Secondary | ICD-10-CM | POA: Diagnosis not present

## 2023-12-11 DIAGNOSIS — R0609 Other forms of dyspnea: Secondary | ICD-10-CM | POA: Diagnosis not present

## 2023-12-11 DIAGNOSIS — R7989 Other specified abnormal findings of blood chemistry: Secondary | ICD-10-CM

## 2023-12-11 LAB — TROPONIN I (HIGH SENSITIVITY)
Troponin I (High Sensitivity): 7 ng/L (ref <18)
Troponin I (High Sensitivity): 8 ng/L (ref <18)

## 2023-12-11 LAB — COMPREHENSIVE METABOLIC PANEL
ALT: 18 U/L (ref 0–44)
AST: 25 U/L (ref 15–41)
Albumin: 3.6 g/dL (ref 3.5–5.0)
Alkaline Phosphatase: 66 U/L (ref 38–126)
Anion gap: 9 (ref 5–15)
BUN: 17 mg/dL (ref 8–23)
CO2: 23 mmol/L (ref 22–32)
Calcium: 9.1 mg/dL (ref 8.9–10.3)
Chloride: 106 mmol/L (ref 98–111)
Creatinine, Ser: 1.13 mg/dL (ref 0.61–1.24)
GFR, Estimated: >60 mL/min (ref >60)
Glucose, Bld: 121 mg/dL — ABNORMAL HIGH (ref 70–99)
Potassium: 4.4 mmol/L (ref 3.5–5.1)
Sodium: 138 mmol/L (ref 135–145)
Total Bilirubin: 1 mg/dL (ref 0.0–1.2)
Total Protein: 6.2 g/dL — ABNORMAL LOW (ref 6.5–8.1)

## 2023-12-11 LAB — CBC
HCT: 45.9 % (ref 39.0–52.0)
Hemoglobin: 15.6 g/dL (ref 13.0–17.0)
MCH: 32.8 pg (ref 26.0–34.0)
MCHC: 34 g/dL (ref 30.0–36.0)
MCV: 96.6 fL (ref 80.0–100.0)
Platelets: 222 10*3/uL (ref 150–400)
RBC: 4.75 MIL/uL (ref 4.22–5.81)
RDW: 13.6 % (ref 11.5–15.5)
WBC: 7.7 10*3/uL (ref 4.0–10.5)
nRBC: 0 % (ref 0.0–0.2)

## 2023-12-11 LAB — URINALYSIS, ROUTINE W REFLEX MICROSCOPIC
Bilirubin Urine: NEGATIVE
Glucose, UA: NEGATIVE mg/dL
Hgb urine dipstick: NEGATIVE
Ketones, ur: NEGATIVE mg/dL
Leukocytes,Ua: NEGATIVE
Nitrite: NEGATIVE
Protein, ur: NEGATIVE mg/dL
Specific Gravity, Urine: 1.025 (ref 1.005–1.030)
pH: 5 (ref 5.0–8.0)

## 2023-12-11 LAB — RESP PANEL BY RT-PCR (RSV, FLU A&B, COVID)  RVPGX2
Influenza A by PCR: NEGATIVE
Influenza B by PCR: NEGATIVE
Resp Syncytial Virus by PCR: NEGATIVE
SARS Coronavirus 2 by RT PCR: NEGATIVE

## 2023-12-11 LAB — BRAIN NATRIURETIC PEPTIDE: B Natriuretic Peptide: 96.7 pg/mL (ref 0.0–100.0)

## 2023-12-11 NOTE — ED Provider Triage Note (Signed)
Emergency Medicine Provider Triage Evaluation Note  Nicholas [name] , a 83 y.o. male  was evaluated in triage.  Pt complains of fatigue.  Review of Systems  Positive:  Negative:   Physical Exam  BP 115/85 (BP Location: Right Arm)   Pulse 64   Temp 98 F (36.7 C)   Resp 18   Ht 5\' 9"  (1.753 m)   Wt 72.6 kg   SpO2 99%   BMI 23.63 kg/m  Gen:   Awake, no distress   Resp:  Normal effort  MSK:   Moves extremities without difficulty  Other:    Medical Decision Making  Medically screening exam initiated at 2:47 PM.  Appropriate orders placed.  Nicholas [name] was informed that the remainder of the evaluation will be completed by another provider, this initial triage assessment does not replace that evaluation, and the importance of remaining in the ED until their evaluation is complete.  Low energy/fatigue x2 days. Patient stating that his BP machine at home told him that he had an irregular heart beat. Stating that he gets winded after a short walk. Patient stating that he has left sided chest/skin pain which he states is dt his recent shingles outbreak in this area. Patient stating that he has diarrhea/soft stools daily d/t having most of colon removed in the past d/t chrons.   Denies fever, cough, nausea, vomiting, dysuria, hematuria, hematochezia.    Nicholas [name], [place] 12/11/23 1450

## 2023-12-11 NOTE — ED Triage Notes (Addendum)
Pt to ED c/o weakness x 2 days, checking VS at home and are concerned for irregular HR. Hx afib, (10 Years ago)  But off medication for years. Denies CP and SHOB,

## 2023-12-11 NOTE — ED Provider Notes (Incomplete)
[place] EMERGENCY DEPARTMENT AT [facility] Provider Note   CSN: [id number] Arrival date & time: 12/11/23  1407     History {Add pertinent medical, surgical, social history, OB history to HPI:1} Chief Complaint  Patient presents with   Weakness    Nicholas [name] is a 83 y.o. male.  The history is provided by the patient and medical records.  Weakness  83 y.o. M with hx of crohn's disease, atrial tachycardia, macular degeneration, HLP, GERD, presenting to the ED for fatigue.  States this has been coming and going for a few months now.  Of the past 2 days he is just felt very fatigued and worn out.  States he will wake up at normal time, eat breakfast, and then just feels exhausted afterwards.  He will go and rest which improves symptoms.  He has not had any lightheadedness with standing or syncopal events.  No chest pain.  Sometimes with walking he feels SOB.  No LE edema.  No hx of CHF.  No fever/chills/sweats.  Occasionally some loose stools but normal for his crohn's disease.  No vomiting.  Eating/drinking as normal.  Does have frequent urination but no dysuria.  Due to physical with PCP in March.  Home Medications Prior to Admission medications   Medication Sig Start Date End Date Taking? Authorizing Provider  amLODipine (NORVASC) 2.5 MG tablet Take 2.5 mg by mouth daily. 07/17/20   [provider]  aspirin (ASPIRIN CHILDRENS) 81 MG chewable tablet Chew 1 tablet (81 mg total) by mouth daily. 10/21/23   Yates Decamp, MD  azaTHIOprine (IMURAN) 50 MG tablet Take 1 tablet by mouth daily. 10/07/21   [provider]  ergocalciferol (VITAMIN D2) 50000 UNITS capsule Take 50,000 Units by mouth once a week.     [provider]  levothyroxine (SYNTHROID) 50 MCG tablet Take 50 mcg by mouth daily before breakfast.    [provider]  loperamide (IMODIUM) 2 MG capsule Take 2 mg by mouth daily as needed for diarrhea or loose stools.    [provider]  losartan (COZAAR) 25 MG tablet TAKE 2 TABLETS (50 MG) BY MOUTH EVERY EVENING. 02/05/22   Cantwell, Celeste C, PA-C  Multiple Vitamin (MULTI VITAMIN DAILY PO) Take 1 tablet by mouth daily.    [provider]  Multiple Vitamins-Minerals (PRESERVISION AREDS 2) CAPS Take by mouth. 1 in the am 1 in the pm    Yates Decamp, MD  rosuvastatin (CRESTOR) 5 MG tablet TAKE 1 TABLET (5 MG TOTAL) BY MOUTH DAILY. 11/27/23 02/25/24  Yates Decamp, MD  testosterone cypionate (DEPOTESTOSTERONE CYPIONATE) 200 MG/ML injection Inject 1 mL into the muscle every 14 (fourteen) days.    [provider]      Allergies    Patient has no known allergies.    Review of Systems   Review of Systems  Neurological:  Positive for weakness.  All other systems reviewed and are negative.   Physical Exam Updated Vital Signs BP 115/85 (BP Location: Right Arm)   Pulse 64   Temp 98 F (36.7 C)   Resp 18   Ht 5\' 9"  (1.753 m)   Wt 72.6 kg   SpO2 99%   BMI 23.63 kg/m  Physical Exam Vitals and nursing note reviewed.  Constitutional:      Appearance: He is well-developed.     Comments: Elderly, NAD  HENT:     Head: Normocephalic and atraumatic.  Eyes:     Conjunctiva/sclera: Conjunctivae  normal.     Pupils: Pupils are equal, round, and reactive to light.  Cardiovascular:     Rate and Rhythm: Normal rate and regular rhythm.     Heart sounds: Normal heart sounds.  Pulmonary:     Effort: Pulmonary effort is normal.     Breath sounds: Normal breath sounds. No wheezing or rhonchi.  Abdominal:     General: Bowel sounds are normal.     Palpations: Abdomen is soft.     Comments: Soft, non-tender  Musculoskeletal:        General: Normal range of motion.     Cervical back: Normal range of motion.  Skin:    General: Skin is warm and dry.  Neurological:     Mental Status: He is alert and oriented to person, place, and time.     Comments: AAOx3, answering questions and following commands  appropriately; equal strength UE and LE bilaterally; CN grossly intact; moves all extremities appropriately without ataxia; no focal neuro deficits or facial asymmetry appreciated     ED Results / Procedures / Treatments   Labs (all labs ordered are listed, but only abnormal results are displayed) Labs Reviewed  COMPREHENSIVE METABOLIC PANEL - Abnormal; Notable for the following components:      Result Value   Glucose, Bld 121 (*)    Total Protein 6.2 (*)    All other components within normal limits  RESP PANEL BY RT-PCR (RSV, FLU A&B, COVID)  RVPGX2  CBC  BRAIN NATRIURETIC PEPTIDE  TROPONIN I (HIGH SENSITIVITY)  TROPONIN I (HIGH SENSITIVITY)    EKG None  Radiology DG Chest 2 View Result Date: 12/11/2023 CLINICAL DATA:  Dyspnea on exertion. EXAM: CHEST - 2 VIEW COMPARISON:  September 02, 2023. FINDINGS: The heart size and mediastinal contours are within normal limits. Both lungs are clear. The visualized skeletal structures are unremarkable. IMPRESSION: No active cardiopulmonary disease. Electronically Signed   By: Lupita Raider M.D.   On: 12/11/2023 16:36    Procedures Procedures  {Document cardiac monitor, telemetry assessment procedure when appropriate:1}  Medications Ordered in ED Medications - No data to display  ED Course/ Medical Decision Making/ A&P   {   Click here for ABCD2, HEART and other calculatorsREFRESH Note before signing :1}                              Medical Decision Making Amount and/or Complexity of Data Reviewed Labs: ordered.   ***  {Document critical care time when appropriate:1} {Document review of labs and clinical decision tools ie heart score, Chads2Vasc2 etc:1}  {Document your independent review of radiology images, and any outside records:1} {Document your discussion with family members, caretakers, and with consultants:1} {Document social determinants of health affecting pt's care:1} {Document your decision making why or why not  admission, treatments were needed:1} Final Clinical Impression(s) / ED Diagnoses Final diagnoses:  None    Rx / DC Orders ED Discharge Orders     None

## 2023-12-12 LAB — TSH: TSH: 4.742 u[IU]/mL — ABNORMAL HIGH (ref 0.350–4.500)

## 2023-12-12 LAB — T4, FREE: Free T4: 0.76 ng/dL (ref 0.61–1.12)

## 2023-12-12 NOTE — Discharge Instructions (Addendum)
Your TSH numbers were elevate today (TSH 4.742), your doctor may need to adjust your synthroid. Please call them on Monday to arrange some close follow-up. Return here for new concerns.

## 2023-12-17 DIAGNOSIS — R413 Other amnesia: Secondary | ICD-10-CM | POA: Diagnosis not present

## 2023-12-17 DIAGNOSIS — I480 Paroxysmal atrial fibrillation: Secondary | ICD-10-CM | POA: Diagnosis not present

## 2023-12-17 DIAGNOSIS — E291 Testicular hypofunction: Secondary | ICD-10-CM | POA: Diagnosis not present

## 2023-12-17 DIAGNOSIS — E039 Hypothyroidism, unspecified: Secondary | ICD-10-CM | POA: Diagnosis not present

## 2023-12-17 DIAGNOSIS — K5090 Crohn's disease, unspecified, without complications: Secondary | ICD-10-CM | POA: Diagnosis not present

## 2023-12-17 DIAGNOSIS — G4733 Obstructive sleep apnea (adult) (pediatric): Secondary | ICD-10-CM | POA: Diagnosis not present

## 2023-12-28 DIAGNOSIS — H6123 Impacted cerumen, bilateral: Secondary | ICD-10-CM | POA: Diagnosis not present

## 2024-01-06 DIAGNOSIS — L570 Actinic keratosis: Secondary | ICD-10-CM | POA: Diagnosis not present

## 2024-01-12 DIAGNOSIS — Z125 Encounter for screening for malignant neoplasm of prostate: Secondary | ICD-10-CM | POA: Diagnosis not present

## 2024-01-12 DIAGNOSIS — E039 Hypothyroidism, unspecified: Secondary | ICD-10-CM | POA: Diagnosis not present

## 2024-01-12 DIAGNOSIS — I10 Essential (primary) hypertension: Secondary | ICD-10-CM | POA: Diagnosis not present

## 2024-01-12 DIAGNOSIS — E291 Testicular hypofunction: Secondary | ICD-10-CM | POA: Diagnosis not present

## 2024-01-12 DIAGNOSIS — R7301 Impaired fasting glucose: Secondary | ICD-10-CM | POA: Diagnosis not present

## 2024-01-12 DIAGNOSIS — E785 Hyperlipidemia, unspecified: Secondary | ICD-10-CM | POA: Diagnosis not present

## 2024-01-12 DIAGNOSIS — E559 Vitamin D deficiency, unspecified: Secondary | ICD-10-CM | POA: Diagnosis not present

## 2024-01-18 DIAGNOSIS — R42 Dizziness and giddiness: Secondary | ICD-10-CM | POA: Diagnosis not present

## 2024-01-18 DIAGNOSIS — H6123 Impacted cerumen, bilateral: Secondary | ICD-10-CM | POA: Diagnosis not present

## 2024-01-19 DIAGNOSIS — I480 Paroxysmal atrial fibrillation: Secondary | ICD-10-CM | POA: Diagnosis not present

## 2024-01-19 DIAGNOSIS — G4733 Obstructive sleep apnea (adult) (pediatric): Secondary | ICD-10-CM | POA: Diagnosis not present

## 2024-01-19 DIAGNOSIS — R413 Other amnesia: Secondary | ICD-10-CM | POA: Diagnosis not present

## 2024-01-19 DIAGNOSIS — E039 Hypothyroidism, unspecified: Secondary | ICD-10-CM | POA: Diagnosis not present

## 2024-01-19 DIAGNOSIS — K912 Postsurgical malabsorption, not elsewhere classified: Secondary | ICD-10-CM | POA: Diagnosis not present

## 2024-01-19 DIAGNOSIS — K5090 Crohn's disease, unspecified, without complications: Secondary | ICD-10-CM | POA: Diagnosis not present

## 2024-01-19 DIAGNOSIS — I10 Essential (primary) hypertension: Secondary | ICD-10-CM | POA: Diagnosis not present

## 2024-01-19 DIAGNOSIS — I739 Peripheral vascular disease, unspecified: Secondary | ICD-10-CM | POA: Diagnosis not present

## 2024-01-19 DIAGNOSIS — E785 Hyperlipidemia, unspecified: Secondary | ICD-10-CM | POA: Diagnosis not present

## 2024-01-19 DIAGNOSIS — E291 Testicular hypofunction: Secondary | ICD-10-CM | POA: Diagnosis not present

## 2024-01-19 DIAGNOSIS — R82998 Other abnormal findings in urine: Secondary | ICD-10-CM | POA: Diagnosis not present

## 2024-01-19 DIAGNOSIS — Z0000 Encounter for general adult medical examination without abnormal findings: Secondary | ICD-10-CM | POA: Diagnosis not present

## 2024-01-27 DIAGNOSIS — L570 Actinic keratosis: Secondary | ICD-10-CM | POA: Diagnosis not present

## 2024-01-27 DIAGNOSIS — D492 Neoplasm of unspecified behavior of bone, soft tissue, and skin: Secondary | ICD-10-CM | POA: Diagnosis not present

## 2024-01-27 DIAGNOSIS — L821 Other seborrheic keratosis: Secondary | ICD-10-CM | POA: Diagnosis not present

## 2024-02-03 ENCOUNTER — Telehealth: Payer: Self-pay

## 2024-02-03 DIAGNOSIS — Z961 Presence of intraocular lens: Secondary | ICD-10-CM | POA: Diagnosis not present

## 2024-02-03 DIAGNOSIS — H353132 Nonexudative age-related macular degeneration, bilateral, intermediate dry stage: Secondary | ICD-10-CM | POA: Diagnosis not present

## 2024-02-03 NOTE — Telephone Encounter (Signed)
 Received referral from [facility] for this patient to be seen due to issues with his mask for his CPAP - patient is an active patient in our office and recently seen by MM. Patient does not need referral or new patient sleep consult for concerns - please contact patient at [phone] to discuss concerns.

## 2024-02-03 NOTE — Telephone Encounter (Signed)
  I called pt and no answer.  Will try later.

## 2024-02-04 DIAGNOSIS — K50813 Crohn's disease of both small and large intestine with fistula: Secondary | ICD-10-CM | POA: Diagnosis not present

## 2024-02-04 DIAGNOSIS — K861 Other chronic pancreatitis: Secondary | ICD-10-CM | POA: Diagnosis not present

## 2024-02-04 DIAGNOSIS — D490 Neoplasm of unspecified behavior of digestive system: Secondary | ICD-10-CM | POA: Diagnosis not present

## 2024-02-04 NOTE — Telephone Encounter (Signed)
 I called pt mobile, no answer.  I called his home # and was able to [facility] to return call.

## 2024-02-08 DIAGNOSIS — H9011 Conductive hearing loss, unilateral, right ear, with unrestricted hearing on the contralateral side: Secondary | ICD-10-CM | POA: Insufficient documentation

## 2024-02-08 DIAGNOSIS — H6121 Impacted cerumen, right ear: Secondary | ICD-10-CM | POA: Diagnosis not present

## 2024-02-08 NOTE — Telephone Encounter (Signed)
 I called pt and no answer, no way to leave message.  This is second call.  I cannot use mychart.  Hopefully he will see missed calls on his phone.

## 2024-03-08 DIAGNOSIS — K912 Postsurgical malabsorption, not elsewhere classified: Secondary | ICD-10-CM | POA: Diagnosis not present

## 2024-03-08 DIAGNOSIS — H8110 Benign paroxysmal vertigo, unspecified ear: Secondary | ICD-10-CM | POA: Diagnosis not present

## 2024-03-08 DIAGNOSIS — E039 Hypothyroidism, unspecified: Secondary | ICD-10-CM | POA: Diagnosis not present

## 2024-03-08 DIAGNOSIS — I480 Paroxysmal atrial fibrillation: Secondary | ICD-10-CM | POA: Diagnosis not present

## 2024-03-08 DIAGNOSIS — R42 Dizziness and giddiness: Secondary | ICD-10-CM | POA: Diagnosis not present

## 2024-03-08 DIAGNOSIS — R413 Other amnesia: Secondary | ICD-10-CM | POA: Diagnosis not present

## 2024-03-08 DIAGNOSIS — H6123 Impacted cerumen, bilateral: Secondary | ICD-10-CM | POA: Diagnosis not present

## 2024-03-16 ENCOUNTER — Telehealth: Payer: Self-pay | Admitting: Adult Health

## 2024-03-16 DIAGNOSIS — B029 Zoster without complications: Secondary | ICD-10-CM | POA: Insufficient documentation

## 2024-03-16 DIAGNOSIS — N1831 Chronic kidney disease, stage 3a: Secondary | ICD-10-CM | POA: Insufficient documentation

## 2024-03-16 DIAGNOSIS — G4733 Obstructive sleep apnea (adult) (pediatric): Secondary | ICD-10-CM | POA: Insufficient documentation

## 2024-03-16 DIAGNOSIS — I131 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, with stage 1 through stage 4 chronic kidney disease, or unspecified chronic kidney disease: Secondary | ICD-10-CM | POA: Insufficient documentation

## 2024-03-16 DIAGNOSIS — R413 Other amnesia: Secondary | ICD-10-CM | POA: Insufficient documentation

## 2024-03-16 DIAGNOSIS — R400 Somnolence: Secondary | ICD-10-CM | POA: Insufficient documentation

## 2024-03-16 DIAGNOSIS — G4761 Periodic limb movement disorder: Secondary | ICD-10-CM | POA: Insufficient documentation

## 2024-03-16 DIAGNOSIS — I739 Peripheral vascular disease, unspecified: Secondary | ICD-10-CM | POA: Insufficient documentation

## 2024-03-16 DIAGNOSIS — R634 Abnormal weight loss: Secondary | ICD-10-CM | POA: Insufficient documentation

## 2024-03-16 DIAGNOSIS — G4731 Primary central sleep apnea: Secondary | ICD-10-CM | POA: Insufficient documentation

## 2024-03-16 DIAGNOSIS — Z79899 Other long term (current) drug therapy: Secondary | ICD-10-CM | POA: Insufficient documentation

## 2024-03-16 NOTE — Telephone Encounter (Signed)
 Appointment details confirmed with pt.

## 2024-03-17 ENCOUNTER — Encounter: Payer: Self-pay | Admitting: Neurology

## 2024-03-17 ENCOUNTER — Ambulatory Visit: Admitting: Neurology

## 2024-03-17 VITALS — BP 157/80 | HR 55 | Ht 69.0 in | Wt 154.8 lb

## 2024-03-17 DIAGNOSIS — G4733 Obstructive sleep apnea (adult) (pediatric): Secondary | ICD-10-CM | POA: Diagnosis not present

## 2024-03-17 DIAGNOSIS — R030 Elevated blood-pressure reading, without diagnosis of hypertension: Secondary | ICD-10-CM | POA: Diagnosis not present

## 2024-03-17 DIAGNOSIS — R413 Other amnesia: Secondary | ICD-10-CM | POA: Diagnosis not present

## 2024-03-17 DIAGNOSIS — R6889 Other general symptoms and signs: Secondary | ICD-10-CM | POA: Diagnosis not present

## 2024-03-17 DIAGNOSIS — Z820 Family history of epilepsy and other diseases of the nervous system: Secondary | ICD-10-CM | POA: Diagnosis not present

## 2024-03-17 NOTE — Patient Instructions (Addendum)
 It was nice to see you again.  Here is what I would recommend:   Blood work (which we will do today).  We will do a brain scan, called MRI and call you with the test results. We will have to schedule you for this on a separate date. This test requires authorization from your insurance, and we will take care of the insurance process. We may consider medication for memory loss. However, since your heart rate is below 60, I would not recommend a medication called donepezil for memory.  We will keep you posted as to your test results by phone call for now.  We will plan a follow-up after testing.   Please get back to using your CPAP regularly. While your insurance requires that you use CPAP at least 4 hours each night on 70% of the nights, I recommend, that you not skip any nights and use it throughout the night if you can. Getting used to CPAP and staying with the treatment long term does take time and patience and discipline. Untreated obstructive sleep apnea when it is moderate to severe can have an adverse impact on cardiovascular health and raise her risk for heart disease, arrhythmias, hypertension, congestive heart failure, stroke and diabetes. Untreated obstructive sleep apnea causes sleep disruption, nonrestorative sleep, and sleep deprivation. This can have an impact on your day to day functioning and cause daytime sleepiness and impairment of cognitive function, memory loss, mood disturbance, and problems focussing. Using CPAP regularly can improve these symptoms. Keep your appointment with Megan in September.

## 2024-03-17 NOTE — Progress Notes (Signed)
 Subjective:    Patient ID: Nicholas [name] is a 83 y.o. male.  HPI    Debbra Fairy, MD, PhD [facility] [address] [address] [place], [place] [address]  Nicholas [name] is an 83 year old right-handed gentleman with an underlying medical history of macular degeneration, PACs, paroxysmal A. fib, heart murmur, Crohn's disease, cataract, aortic stenosis, vitamin D deficiency, weight loss, anemia, hyperlipidemia, sleep apnea on PAP therapy, who presents for a new problem visit of memory loss.  He is accompanied by his wife today.  We have followed him in our sleep clinic for the past several years.  He was last seen by Colby Daub, NP September 2024, at which time he was not fully compliant with his CPAP.   Today, 03/17/2024: He reports having forgetfulness.  Symptoms have been ongoing for the past couple of years.  He reports that his mom had dementia.  She lived to be in her 15s, her dad lived to be in his 76s, his older sister who is 79 years old and has memory loss.  He has a younger brother who does not have any memory loss.  He feels that his dizziness is intermittent and typically when he stands up too quickly, he has not had no recent falls.  He tries to hydrate well with water .  His wife does the driving now.  He does not use his PAP consistently lately has stopped using it.  He had suboptimal compliance in the month of mid March through mid April 2025.  He reports that it is cumbersome to put it back on when he was to the bathroom, his Crohn's disease causes him to get up and go to the bathroom at least a couple of times a night. He takes an over-the-counter vitamin B-12 supplement.  He had an appointment with Arma Lamp, NP on 03/08/2024 and I reviewed the office visit note.  He reported positional dizziness at the time for several days.  He had had a prior episode of positional vertigo 7 years ago according to office visit notes.  He has had worsening memory  for the past few months.  He had blood work through his primary care on 01/12/2024 and I reviewed results in his chart.  His TSH was below normal at 0.39.  He had blood work on 03/08/2024 which showed a normal TSH at 1.17.  CBC showed a low normal lymphocytes.  WBC normal at 7 point.  Hemoglobin and hematocrit normal, MCV above normal at 100.2.  BMP showed glucose of 96, BUN 10, creatinine 1.0.  MMSE was 21 out of 30 at the time.  Of note, his ophthalmologist ordered a brain MRI about 2 years ago.  He had a brain MRI with and without contrast with and without contrast on 04/22/2022 and I reviewed the results:   IMPRESSION: No evidence of recent infarction, hemorrhage, or mass. No abnormal enhancement. Mild chronic microvascular ischemic changes.     The patient's allergies, current medications, family history, past medical history, past social history, past surgical history and problem list were reviewed and updated as appropriate.    Previously:   07/21/23 Nicholas Currier, NP): << Nicholas [name] is a 83 y.o. male with a history of OSA on CPAP. Returns today for follow-up. Reports that he was using the CPAP consistently but had skin cancer removed from the top of his head.  He states he was unable to use it for a couple weeks however he is now  back on the CPAP machine.  He denies any new issues.  His download is below.  05/08/22 Nicholas Currier, NP): <<Nicholas [name] is an 83 year old male with a history of obstructive sleep apnea on CPAP.  He returns today for follow-up.  He reports that the CPAP is working fairly well for him.  At the last visit his pressure was reduced to 11 and his AHI has decreased.  He still reports daytime fatigue but has been discussing this with his PCP.  His download is below >>   He does report during the visit that he has noticed some changes with his memory.  He has not discussed with his primary care to his knowledge. >>  05/16/2021 (SA): I reviewed his CPAP compliance data  from 04/15/2021 through 05/14/2021, which is a total of 30 days, during which time he used his machine 27 days with percent use days greater than 4 hours at 77%, indicating good compliance with an average usage of 6 hours and 47 minutes for days on treatment, residual AHI elevated at 13.1/h, primarily due to central events with a central apnea index of 8.9/h, leak acceptable with a 95th percentile at 14.1 L/min on a pressure of 12 cm with EPR of 3.  He reports being able to tolerate the mask.  He is using a hybrid style fullface mask from [employer].  Leak fluctuates.  He is still adjusting to treatment, he has not noticed any telltale improvement in his sleep quality.  In the past year he has noted cold intolerance.  He feels especially cold overnight and shivers.  He uses multiple blankets and has a thermostat set at 78.  He had some blood work through his primary care within the past couple of months but is not sure what all was checked.  He had a telemedicine follow-up with his GI doctor for his Crohn's.  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  He also had a stress test with his cardiologist recently.  He had no medication changes.    I saw him on 02/19/2021, at which time he reported that his machine had been picked up by his DME company in February.  However, since his pressure was not set to 12 cm and was too high in the beginning due to a miscommunication, he never really had a chance to be compliant with his CPAP of 12 cm.  We were able to get an extension of his CPAP usage.   He was restarted on CPAP in May 2022, on 03/22/21.     I saw him on 12/19/20, at which time he was struggling with his CPAP, and was at 15 cm. He was advised that the original order of CPAP of 12 cm was misinterpreted by his DME company. They reported that the original order came through with a smudge and the 12 cm look like 17 cm.       I saw him on  07/23/2020 for reevaluation of his sleep disturbance.  He was complaining of daytime tiredness.  He was agreeable to pursuing a sleep study.  He had a baseline sleep study, followed by a CPAP titration study.  His baseline sleep study from 08/06/2020 showed a sleep latency of 32 minutes, REM latency delayed at 202.5 minutes, sleep efficiency 72.2%.  Wake after sleep onset was elevated at 108.5 minutes with mild to moderate sleep fragmentation noted.  He had an  increased percentage of stage II sleep, near absence of slow-wave sleep and REM sleep was 17.6%.  Total AHI was in the moderate range at 26.1/h, REM AHI 27/h, supine AHI 39/h.  Average oxygen  saturation was 96%, nadir was 82%.  He was noted to have significant central apneas and these were more than 50% of his respiratory events.  He had mild periodic limb movements without significant arousals.  He was advised to return for a full night titration study.  He had this on 08/28/2020.  Sleep efficiency was 47.8%, sleep latency to persistent sleep delayed at 159.5 minutes and REM latency markedly delayed at 244.5 minutes.  He was fitted with a full facemask.  He was started on CPAP therapy at 5 cm and titrated to a final pressure of 12 cm.  His AHI was 0/h, O2 nadir 96% with nonsupine and non-REM sleep achieved only.  He was advised to start CPAP therapy at home.  He was again noted to have mild periodic leg movements with minimal arousals.  His set up date was 10/24/2020.   I reviewed his CPAP compliance data from the past 54 days, since set up, he used his machine 21 out of 54 days with percent use days greater than 4 hours at 15%, essentially no usage since early January, around 11/16/2020, 1 night of usage on 12/15/2020.  He had called in the interim in December reporting that the pressure was difficult to tolerate and he was having trouble with the mask.  He was advised to meet up with his DME provider for mask issues.     I first met him at the request of his  primary care physician, Dr. Efraim Grange, on 10/20/2019, at which time the patient reported snoring and daytime tiredness.  His tiredness improved a little bit in the recent months.  He reported some weight loss.  He was trying to supplement his nutrition with Ensure.  He was struggling with diarrhea.  He was advised to proceed with sleep study testing but wanted to delay the test till after the first of the year.  He did not pursue sleep study testing earlier in the year.     10/20/19: (He) reports snoring and daytime tiredness.  His tiredness has improved a little bit in the past couple of months.  He has had some unintentional weight loss and has started using Ensure, half a bottle twice daily.  He still struggles with diarrhea.  He goes to bed generally around 9-ish and rise time is around 7.  He does not have night to night nocturia and denies any recurrent morning headaches.  His middle daughter has sleep apnea but does not use her CPAP machine.  He has woken up with a snorting sound and his wife has noted occasional snorting sounds when he is asleep.  He does have some restless sleep at times, he does not take any sleep aid.  He had a tonsillectomy at age 41.  He has a prescription for Synthroid pending which he has not yet started. I reviewed your telemedicine note from 10/03/2019, which you kindly included.  His Epworth sleepiness score is 10 out of 24, fatigue severity score is 42 out of 63.  He is a retired Chartered certified accountant.  He lives with his wife, no pets in the house.  He lost his oldest daughter last year and, tragically, his only granddaughter from her took her own life the year before at age 1.   His Past Medical History Is Significant  For: Past Medical History:  Diagnosis Date   Anemia    PO IRON    Aortic stenosis, mild    Cataract    Colonic hemorrhage 11/10/1962   Crohn's disease (HCC)    GERD (gastroesophageal reflux disease)    Heart murmur    Hyperlipidemia    Macular degeneration     PAC (premature atrial contraction)    Palpitations    Sepsis (HCC) 02/23/2015   SIRS (systemic inflammatory response syndrome) (HCC) 02/23/2015   Sleep apnea, obstructive 08/28/2020   on CPAP   Squamous cell skin cancer     His Past Surgical History Is Significant For: Past Surgical History:  Procedure Laterality Date   APPENDECTOMY     Biopsy Facial  05/10/2019   BLADDER REPAIR  1964   intestine leaks/bladder repair   CATARACT EXTRACTION W/ INTRAOCULAR LENS  IMPLANT, BILATERAL     2013   ILEOSTOMY  04/16/2012   Procedure: ILEOSTOMY;  Surgeon: Levert Ready, MD;  Location: [facility] OR;  Service: General;  Laterality: N/A;   ILEOSTOMY CLOSURE  12/07/2012   ILEOSTOMY CLOSURE  12/07/2012   Procedure: ILEOSTOMY TAKEDOWN;  Surgeon: Levert Ready, MD;  Location: [facility] OR;  Service: General;  Laterality: N/A;   LAPAROTOMY  04/16/2012   Procedure: EXPLORATORY LAPAROTOMY;  Surgeon: Levert Ready, MD;  Location: [facility] OR;  Service: General;  Laterality: N/A;   LYSIS OF ADHESION  12/07/2012   Procedure: LYSIS OF ADHESION;  Surgeon: Levert Ready, MD;  Location: [facility] OR;  Service: General;  Laterality: N/A;   PROCTOSCOPY  12/07/2012   Procedure: PROCTOSCOPY;  Surgeon: Levert Ready, MD;  Location: [facility] OR;  Service: General;;  ILEO PROCTOSCOPY   SKIN CANCER EXCISION     STOMACH SURGERY  1959   ulcer   TONSILLECTOMY      His Family History Is Significant For: Family History  Problem Relation Age of Onset   Stroke Mother    Multiple myeloma Father    Heart disease Maternal Grandfather    Heart attack Daughter    Congestive Heart Failure Daughter    Heart disease Other        maternal side   Colon cancer Neg Hx    Crohn's disease Neg Hx     His Social History Is Significant For: Social History   Socioeconomic History   Marital status: Married    Spouse name: Not on file   Number of children: 3   Years of education: Not on file   Highest education level: Not on file   Occupational History   Occupation: Retired    Associate Professor: [employer]  Tobacco Use   Smoking status: Former    Current packs/day: 0.00    Average packs/day: 1 pack/day for 2.0 years (2.0 ttl pk-yrs)    Types: Cigarettes    Start date: 11/10/1962    Quit date: 11/10/1964    Years since quitting: 59.3   Smokeless tobacco: Never  Vaping Use   Vaping status: Never Used  Substance and Sexual Activity   Alcohol  use: No   Drug use: No   Sexual activity: Not on file  Other Topics Concern   Not on file  Social History Narrative   caffeinated drinks less than one a day. Eldest daughter passed from heart deficiency.    Social Drivers of Corporate investment banker Strain: Not on file  Food Insecurity: Not on file  Transportation Needs: Not on file  Physical Activity:  Not on file  Stress: Not on file  Social Connections: Not on file    His Allergies Are:  No Known Allergies:   His Current Medications Are:  Outpatient Encounter Medications as of 03/17/2024  Medication Sig   amLODipine (NORVASC) 2.5 MG tablet Take 2.5 mg by mouth daily.   aspirin  (ASPIRIN  CHILDRENS) 81 MG chewable tablet Chew 1 tablet (81 mg total) by mouth daily.   azaTHIOprine  (IMURAN ) 50 MG tablet Take 1 tablet by mouth daily.   ergocalciferol (VITAMIN D2) 50000 UNITS capsule Take 50,000 Units by mouth once a week.    ferrous sulfate 325 (65 FE) MG tablet 1 tablet Oral once daily   levothyroxine (SYNTHROID) 50 MCG tablet Take 50 mcg by mouth daily before breakfast.   losartan  (COZAAR ) 25 MG tablet TAKE 2 TABLETS (50 MG) BY MOUTH EVERY EVENING.   Multiple Vitamin (MULTI VITAMIN DAILY PO) Take 1 tablet by mouth daily.   Multiple Vitamins-Minerals (PRESERVISION AREDS 2) CAPS Take by mouth. 1 in the am 1 in the pm   loperamide (IMODIUM) 2 MG capsule Take 2 mg by mouth daily as needed for diarrhea or loose stools. (Patient not taking: Reported on 03/17/2024)   rosuvastatin  (CRESTOR ) 5 MG tablet TAKE 1 TABLET (5 MG TOTAL)  BY MOUTH DAILY.   testosterone  cypionate (DEPOTESTOSTERONE CYPIONATE) 200 MG/ML injection Inject 1 mL into the muscle every 14 (fourteen) days. (Patient not taking: Reported on 03/17/2024)   No facility-administered encounter medications on file as of 03/17/2024.  :   Review of Systems:  Out of a complete 14 point review of systems, all are reviewed and negative with the exception of these symptoms as listed below:  Review of Systems  Neurological:        Room 5 Pt is here with his Wife. Pt has medication for his Vertigo and has been treated for that. Pt states that a month ago pt states that he went to [facility] and got what he needed and he had forgotten how to exit [employer]. Pt states that he hasn't used his CPAP Lately. Pt states he doesn't see the difference when he wakes up everyday when he doesn't use is machine, and when he does. Pt states that he feels the same either way. Pt states that he has Crohn's and would have to get up at night and clean up and would forget to put his CPAP back on. ESS 12 FSS     Objective:  Neurological Exam  Physical Exam Physical Examination:   Vitals:   03/17/24 1508  BP: (!) 157/80  Pulse: (!) 55    General Examination: The patient is a very pleasant 83 y.o. male in no acute distress. He appears well-developed and well-nourished and well groomed.   HEENT: Normocephalic, atraumatic, pupils are equal, round and reactive to light, extraocular tracking is good without limitation to gaze excursion or nystagmus noted. Hearing is grossly intact. Face is symmetric with normal facial animation. Speech is clear with no dysarthria noted. There is no hypophonia. There is no lip, neck/head, jaw or voice tremor. Neck is supple with full range of passive and active motion. There are no carotid bruits on auscultation. Oropharynx exam reveals: mild mouth dryness, adequate dental hygiene and mild airway crowding. Tongue protrudes centrally in palate elevates symmetrically.    Chest: Clear to auscultation without wheezing, rhonchi or crackles noted.   Heart: S1+S2+0, regular and normal without murmurs, rubs or gallops noted.   Abdomen: Soft, non-tender and non-distended.  Extremities: There is no pitting edema in the distal lower extremities bilaterally.   Skin: Warm and dry without trophic changes noted.   Musculoskeletal: exam reveals no obvious joint deformities.    Neurologically: Mental status: The patient is awake, paying attention but providing not a very detailed history.  History is somewhat supplemented by his wife today.   Thought process is linear. Mood is normal and affect is normal. Cranial nerves II - XII are as described above under HEENT exam. Motor exam: Normal bulk, strength and tone is noted. There is no obvious tremor, fine motor skills and coordination: grossly intact. Cerebellar testing: No dysmetria or intention tremor. There is no truncal or gait ataxia. Sensory exam: intact to light touch. Gait, station and balance: No problems walking.   Reflexes are 1+ in the upper extremities and diminished in the lower extremities.   Assessment and Plan:  In summary, Nicholas [name] is a very pleasant 83 year old right-handed gentleman with an underlying medical history of macular degeneration, PACs, paroxysmal A. fib, heart murmur, Crohn's disease, cataract, aortic stenosis, vitamin D deficiency, weight loss, anemia, hyperlipidemia, sleep apnea on PAP therapy, who presents for patient all of his memory loss of approximately 2 years' duration.  His MMSE was recently 21 out of 30 at his primary care office.  He has not been on any memory medication.  He has a mildly elevated blood pressure today and mild bradycardia.  We talked about risk factors for memory loss including Alzheimer's dementia but also vascular risk factors.  He is advised to get back on his CPAP machine consistently.  He is willing to do so.  He is also advised to blood work done  today, we will check a vitamin B12 level he had recent blood testing through his PCP as well which we talked about.  He we will probably consider memory medication in the near future but I would probably want to avoid donepezil because of risk for bradycardia with it.  We will proceed with a brain MRI with and without contrast to rule out or structural cause of his symptoms and get a baseline MRI.  They are advised to keep his follow-up appointment as scheduled for September.  I answered all their questions today and they were in agreement. I spent 45 minutes in total face-to-face time and in reviewing records during pre-charting, more than 50% of which was spent in counseling and coordination of care, reviewing test results, reviewing medications and treatment regimen and/or in discussing or reviewing the diagnosis of memory loss, the prognosis and treatment options. Pertinent laboratory and imaging test results that were available during this visit with the patient were reviewed by me and considered in my medical decision making (see chart for details).

## 2024-03-18 LAB — B12 AND FOLATE PANEL
Folate: 19.7 ng/mL (ref >3.0)
Vitamin B-12: 1901 pg/mL — ABNORMAL HIGH (ref 232–1245)

## 2024-03-22 ENCOUNTER — Ambulatory Visit
Admission: RE | Admit: 2024-03-22 | Discharge: 2024-03-22 | Disposition: A | Discharge status: Home / Self Care | Source: Ambulatory Visit | Attending: Neurology | Admitting: Neurology

## 2024-03-22 ENCOUNTER — Ambulatory Visit: Payer: Self-pay | Admitting: Neurology

## 2024-03-22 DIAGNOSIS — R413 Other amnesia: Secondary | ICD-10-CM | POA: Diagnosis not present

## 2024-03-22 DIAGNOSIS — G4733 Obstructive sleep apnea (adult) (pediatric): Secondary | ICD-10-CM | POA: Diagnosis not present

## 2024-03-22 DIAGNOSIS — R6889 Other general symptoms and signs: Secondary | ICD-10-CM

## 2024-03-22 DIAGNOSIS — Z820 Family history of epilepsy and other diseases of the nervous system: Secondary | ICD-10-CM | POA: Diagnosis not present

## 2024-03-22 MED ORDER — GADOPICLENOL 0.5 MMOL/ML IV SOLN
7.0000 mL | Freq: Once | INTRAVENOUS | Status: AC | PRN
  Administered 2024-03-22: 7 mL via INTRAVENOUS

## 2024-03-22 NOTE — Telephone Encounter (Signed)
 Spoke with patient and discussed results of labs and MRI brain as noted below by Dr Omar Bibber. The patient verbalized understanding. He stated he had his wife Adriana Hopping (on [place]) listening on speaker phone. Pt said he would stop his B12 supplement and follow-up with Dr Efraim Grange. He is aware he can also follow-up with Dr Efraim Grange if he is having any sinus issues. He thanked me for the call and did not have any questions at the time of the call.

## 2024-04-11 NOTE — Telephone Encounter (Signed)
 Pt's wife requesting a call back to discuss MRI result. Mrs. Mahaffy said she did not really understand when discussed by speaker phone.

## 2024-04-13 ENCOUNTER — Telehealth: Payer: Self-pay | Admitting: Neurology

## 2024-04-13 DIAGNOSIS — G4733 Obstructive sleep apnea (adult) (pediatric): Secondary | ICD-10-CM

## 2024-04-13 DIAGNOSIS — L578 Other skin changes due to chronic exposure to nonionizing radiation: Secondary | ICD-10-CM | POA: Diagnosis not present

## 2024-04-13 DIAGNOSIS — Z08 Encounter for follow-up examination after completed treatment for malignant neoplasm: Secondary | ICD-10-CM | POA: Diagnosis not present

## 2024-04-13 DIAGNOSIS — Z789 Other specified health status: Secondary | ICD-10-CM

## 2024-04-13 DIAGNOSIS — L570 Actinic keratosis: Secondary | ICD-10-CM | POA: Diagnosis not present

## 2024-04-13 DIAGNOSIS — Z872 Personal history of diseases of the skin and subcutaneous tissue: Secondary | ICD-10-CM | POA: Diagnosis not present

## 2024-04-13 DIAGNOSIS — Z85828 Personal history of other malignant neoplasm of skin: Secondary | ICD-10-CM | POA: Diagnosis not present

## 2024-04-13 NOTE — Telephone Encounter (Signed)
 Please advise patient or his wife, that I would like to focus on getting him consistent with his CPAP first, before he start him on any new medication for memory loss.  As he has a history of moderate obstructive sleep apnea and it can increase risk for memory loss and dementia when left untreated, I would like to focus on getting him treated for his sleep apnea first.  We can review everything during his appointment in September with Megan and certainly consider possible medication options for memory loss at the time.

## 2024-04-13 NOTE — Telephone Encounter (Signed)
 Spoke to Patient and wife (checked DPR) reviewed MRI and lab results again and gave DR Omar Bibber recommendations. Wife had questions about a medication for memory since MRI was stable Per last office note  We may consider medication for memory loss. However, since your heart rate is below 60, I would not recommend a medication called donepezil for memory. Informed wife will forward question to Dr Omar Bibber and will call back with Dr.Athars recommendation. Wife was appreciative  and thanked me for calling

## 2024-04-14 NOTE — Telephone Encounter (Signed)
 Nicholas [name]

## 2024-04-14 NOTE — Telephone Encounter (Signed)
 I called and spoke with the patient and his wife and relayed the message from Dr. Omar Bibber to the wife.  Patient's wife states he has been having trouble using the CPAP.  He has some skin cancers on the top of his head and is getting ready to have a 6-week chemo treatment that is put on the lesion(s).  We discussed that perhaps if he could use a different type of mask it may alleviate a strap from going across the top of his head.  They were both in agreement with trying a new mask. Patient said his mask will leak. Wife said Dr Margart Shears suggested he could try autopap instead.  I will ask for a mask refit and review for any need for pressure change.  We also discussed that memory can be worse with untreated sleep apnea.  The patient mentioned he had been having episodes of being very tired.  Patient's wife says he has seen primary care for this and has another appointment coming up.  They thanked me for the call.

## 2024-04-24 NOTE — Addendum Note (Signed)
 Addended by: Braelin Brosch on: 04/24/2024 02:27 PM   Modules accepted: Orders

## 2024-04-24 NOTE — Telephone Encounter (Signed)
 I would recommend a mask refit appointment with DME company and we can certainly trial him on AutoPap of 8 to 12 cm with EPR of 3.  Order placed.

## 2024-04-25 NOTE — Telephone Encounter (Signed)
 I called to follow up on this message.  I relayed to wife of pt that Dr. Omar Bibber did order a mask refit and also to change cpap to autopap settings.  I gave her the # to call if they do not call him.  She verbalized understanding and appreciated call back.

## 2024-04-25 NOTE — Telephone Encounter (Signed)
 Zott, Ova Bloomer, Jeppie Moles, RN; [place], [place]; Pearson Bounds Got It Thank You     Previous Messages    ----- Message ----- From: Viktoria Gray, RN Sent: 04/25/2024   9:43 AM EDT To: Michaele Adjutant; Loyce Ruffini Zott Subject: change from cpap to autopap settings          New order in epic  Mask refit (has some skin ca lesions) and also change to autopap settings.  Nicholas [name] Male, 83 y.o., [birth year] MRN: [id number] Phone: [phone]   Thank you  Raynelle Callow

## 2024-04-25 NOTE — Telephone Encounter (Signed)
 Sent order to [facility] via community message.

## 2024-04-26 ENCOUNTER — Other Ambulatory Visit: Payer: Self-pay | Admitting: Internal Medicine

## 2024-04-26 DIAGNOSIS — L299 Pruritus, unspecified: Secondary | ICD-10-CM | POA: Insufficient documentation

## 2024-04-26 DIAGNOSIS — R42 Dizziness and giddiness: Secondary | ICD-10-CM

## 2024-04-26 DIAGNOSIS — H6123 Impacted cerumen, bilateral: Secondary | ICD-10-CM | POA: Diagnosis not present

## 2024-04-26 NOTE — Progress Notes (Signed)
 Otolaryngology Clinic Note  HPI:    History of Present Illness The patient is an 83 year old gentleman here today for ear cleaning. He was seen by myself a few weeks ago.  He has been experiencing vertigo since April 2025 and has been managing it with meclizine.  Patient is unable to describe in detail the sensation of vertigo but states that it is worse when standing up and during the day he is unsure if it occurs with head turning at all.  He has seen his primary care doctor Dr. Jakie in regards to this.  Per wife that Dr. Jakie will be placing an order for physical therapy referral. He does not use hearing aids. He also reports occasional itching in his ears.  PMH/Meds/All/SocHx/FamHx/ROS:   Medical History[1]  Surgical History[2]  No family history of bleeding disorders, wound healing problems or difficulty with anesthesia.   Social History   Socioeconomic History  . Marital status: Married    Spouse name: Not on file  . Number of children: Not on file  . Years of education: Not on file  . Highest education level: Not on file  Occupational History  . Not on file  Tobacco Use  . Smoking status: Former    Current packs/day: 1.00    Types: Cigarettes  . Smokeless tobacco: Never  . Tobacco comments:    40 yrs ago  Substance and Sexual Activity  . Alcohol  use: No  . Drug use: No  . Sexual activity: Not on file  Other Topics Concern  . Not on file  Social History Narrative  . Not on file   Social Drivers of Health   Food Insecurity: Not on file  Transportation Needs: Not on file  Safety: Not on file  Living Situation: Not on file    Current Medications[3]  A complete ROS was performed with pertinent positives/negatives noted in the HPI. The remainder of the ROS are negative.    Physical Exam:    There were no vitals taken for this visit.   General: Well developed, well nourished. No acute distress. Voice normal.  Head/Face: Normocephalic,  atraumatic. No scars or lesions. No sinus tenderness. Facial nerve intact and equal bilaterally. No facial lacerations. TMJ nontender to palpation.  Eyes: Pupils are equal, round and reactive to light. Conjunctiva and lids are normal. Normal extraocular mobility.   Ears:   Right: Pinna and external meatus normal, normal ear canal skin, narrow caliber and abundant hair, without drainage. Impacted cerumen, cleared under otomicroscopy, once cleared tympanic membrane intact and without effusion.   Left: Pinna and external meatus normal, normal ear canal skin, narrow caliber and abundant hair, without drainage. Impacted cerumen, cleared under otomicroscopy, once cleared tympanic membrane intact and without effusion.  Hearing: Normal speech reception to conversational tone.  Nose: No gross deformity or lesions. No purulent discharge. Septum midline, no turbinate hypertrophy with normal mucosa.  Mouth/Oropharynx: Lips normal, teeth and gums normal with good dentition, normal oral vestibule. Normal floor of mouth, tongue and oral mucosa, no mucosal lesions, ulcer or mass, normal tongue mobility. Uvula midline. Hard and soft palate normal with normal mobility. Surgically absent tonsils, no erythema or exudate.  Larynx: See TFL if applicable  Nasopharynx: See TFL if applicable  Neck: Trachea midline. No masses. No crepitus. Normal thyroid  gland palpation without thyromegaly or nodules palpated. Salivary glands normal to palpation without swelling, erythema or mass.   Lymphatic: No lymphadenopathy in the neck.  Respiratory: No stridor or distress.  Extremities: No  edema or cyanosis. Warm and well-perfused.  Neurologic: CN II-XII intact. Alert and oriented to self, place and time.  Normal reflexes and motor skills, balance and coordination. Moving all extremities without gross abnormality.  Psychiatric:  No unusual anxiety or evidence of depression. Appropriate affect.    Independent Review of Additional  Tests or Records:   Medical Records  Procedures:   Ear Cerumen Removal  Date/Time: 04/26/2024 11:00 AM  Performed by: Olam Vina Simpler, NP Authorized by: Olam Vina Simpler, NP   Procedure Details:  Impacted cerumen: Yes Location details: bilateral  Post Procedure Details:  Procedure findings: complete impaction removal Patient tolerance of procedure: patient tolerated the procedure without difficulty Comments: The patient was positioned and the ear was examined with the microscope. Obstructing cerumen was gently removed using suction from both ear canals. TMs fully visualized, normal and intact. Middle ears aerated. No sign of infection or cholesteatoma.     Impression & Plans:  Nicholas [name] is a 83 y.o. male with   1. Bilateral impacted cerumen      2. Dizziness  CANCELED: Ambulatory referral to Physical Therapy    3. Ear itch        Assessment & Plan 1. Cerumen impaction, bilateral and ear itch Presents with a narrow ear canal, which is further obstructed by significant hair growth. This combination complicates the removal of cerumen. The majority of the cerumen was successfully removed during this visit. Advised against the use of Q-tips due to their potential to push the cerumen deeper into the ear canal. Recommended to use mineral oil or baby oil once a week to soften the cerumen and facilitate its natural expulsion.  2. Dizziness. Reported experiencing vertigo since 02/2024 and has been taking meclizine.  Patient's wife says that his PCP Dr. Jakie will be calling for a consult with physical therapy for further evaluation of the dizziness he is unable to fully describe if it is spinning versus off-balance and if it occurs with head or motion changes.  Follow-up Follow up in 3 months.  I have personally spent 30 minutes involved in face-to-face for this visit, 20 minutes for the procedure (billed under procedure code), and 10 minutes reviewing  documentation.   Electronically signed by: Olam Vina Simpler, NP 04/26/2024 10:57 AM            [1] Past Medical History: Diagnosis Date  . Atrial fibrillation    (CMD)   . Cancer of ear   . Chronic pancreatitis    (CMD)   . Crohn's disease    (CMD)   . Lung nodule   [2] Past Surgical History: Procedure Laterality Date  . APPENDECTOMY     Procedure: APPENDECTOMY  . COLON SURGERY     Procedure: COLON SURGERY  . STOMACH SURGERY     Procedure: STOMACH SURGERY  . TONSILLECTOMY     Procedure: TONSILLECTOMY  [3]  Current Outpatient Medications:  .  amLODIPine (NORVASC) 2.5 mg tablet, Take 2.5 mg by mouth Once Daily., Disp: , Rfl:  .  aspirin  81 mg chewable tablet, Chew 81 mg., Disp: , Rfl:  .  azaTHIOprine  (IMURAN ) 50 mg tablet, Take 0.5 tablets (25 mg total) by mouth daily., Disp: , Rfl:  .  cholecalciferol, vitamin D3, (VITAMIN D3 ORAL), Take by mouth., Disp: , Rfl:  .  ergocalciferol (VITAMIN D2) 1,250 mcg (50,000 unit) capsule, Take 50,000 Units by mouth once a week., Disp: , Rfl:  .  ferrous sulfate 325 mg (65 mg iron) tablet,  Take 325 mg by mouth 2 (two) times a day., Disp: , Rfl:  .  lactose reduced nutritional shake (Ensure Plus) 0.05 gram- 1.5 kcal/mL liqd liquid, Take by mouth daily., Disp: , Rfl:  .  levothyroxine (SYNTHROID) 50 mcg tablet, Take 50 mcg by mouth Once Daily., Disp: , Rfl:  .  loperamide (IMODIUM) 2 mg capsule, Take 1 capsule (2 mg total) by mouth 4 (four) times a day as needed for diarrhea., Disp: 240 capsule, Rfl: 5 .  losartan  (COZAAR ) 25 mg tablet, 25 mg every evening., Disp: , Rfl:  .  methylcellulose, laxative, (CitruceL) 500 mg tablet, Take by mouth as needed., Disp: , Rfl:  .  multivitamin (THERAGRAN) tab tablet, Take 1 tablet by mouth Once Daily., Disp: , Rfl:  .  rosuvastatin  (CRESTOR ) 5 mg tablet, Take 5 mg by mouth daily., Disp: , Rfl:  .  simethicone  (MYLICON) 125 mg chewable tablet, 1 tablet., Disp: , Rfl:  .  testosterone  cypionate  (DEPO-TESTOSTERONE ) 200 mg/mL injection, Inject 200 mg into the muscle every 14 (fourteen) days., Disp: , Rfl:  .  vit C,E-Zn-coppr-lutein-zeaxan (PreserVision AREDS-2) 250-90-40-1 mg cap capsule, Take  by mouth 2 (two) times a day., Disp: , Rfl:  .  vitamins A,C,E-zinc -copper  (I-VITE PROTECT) 2,148 mcg-113 mg-45 mg-17.4mg  tab, 1 tablet daily., Disp: , Rfl:

## 2024-04-28 DIAGNOSIS — G4733 Obstructive sleep apnea (adult) (pediatric): Secondary | ICD-10-CM | POA: Diagnosis not present

## 2024-05-02 ENCOUNTER — Ambulatory Visit
Admission: RE | Admit: 2024-05-02 | Discharge: 2024-05-02 | Disposition: A | Discharge status: Home / Self Care | Source: Ambulatory Visit | Attending: Internal Medicine | Admitting: Internal Medicine

## 2024-05-02 DIAGNOSIS — R42 Dizziness and giddiness: Secondary | ICD-10-CM

## 2024-05-03 ENCOUNTER — Other Ambulatory Visit: Payer: Self-pay

## 2024-05-03 DIAGNOSIS — I2510 Atherosclerotic heart disease of native coronary artery without angina pectoris: Secondary | ICD-10-CM

## 2024-05-03 MED ORDER — ROSUVASTATIN CALCIUM 5 MG PO TABS: 5.0000 mg | ORAL_TABLET | Freq: Every day | ORAL | 1 refills | Status: DC

## 2024-05-27 DIAGNOSIS — K5090 Crohn's disease, unspecified, without complications: Secondary | ICD-10-CM | POA: Diagnosis not present

## 2024-05-27 DIAGNOSIS — G4733 Obstructive sleep apnea (adult) (pediatric): Secondary | ICD-10-CM | POA: Diagnosis not present

## 2024-05-27 DIAGNOSIS — B351 Tinea unguium: Secondary | ICD-10-CM | POA: Diagnosis not present

## 2024-05-27 DIAGNOSIS — I739 Peripheral vascular disease, unspecified: Secondary | ICD-10-CM | POA: Diagnosis not present

## 2024-05-27 DIAGNOSIS — R5383 Other fatigue: Secondary | ICD-10-CM | POA: Diagnosis not present

## 2024-05-27 DIAGNOSIS — R42 Dizziness and giddiness: Secondary | ICD-10-CM | POA: Diagnosis not present

## 2024-05-27 DIAGNOSIS — R7301 Impaired fasting glucose: Secondary | ICD-10-CM | POA: Diagnosis not present

## 2024-05-27 DIAGNOSIS — E039 Hypothyroidism, unspecified: Secondary | ICD-10-CM | POA: Diagnosis not present

## 2024-05-27 DIAGNOSIS — R413 Other amnesia: Secondary | ICD-10-CM | POA: Diagnosis not present

## 2024-05-27 DIAGNOSIS — H8110 Benign paroxysmal vertigo, unspecified ear: Secondary | ICD-10-CM | POA: Diagnosis not present

## 2024-05-31 ENCOUNTER — Emergency Department (HOSPITAL_COMMUNITY)
Admission: EM | Admit: 2024-05-31 | Discharge: 2024-05-31 | Disposition: A | Discharge status: Home / Self Care | Attending: Emergency Medicine | Admitting: Emergency Medicine

## 2024-05-31 ENCOUNTER — Encounter (HOSPITAL_COMMUNITY): Payer: Self-pay

## 2024-05-31 ENCOUNTER — Other Ambulatory Visit: Payer: Self-pay

## 2024-05-31 DIAGNOSIS — Z85828 Personal history of other malignant neoplasm of skin: Secondary | ICD-10-CM | POA: Insufficient documentation

## 2024-05-31 DIAGNOSIS — Z7982 Long term (current) use of aspirin: Secondary | ICD-10-CM | POA: Diagnosis not present

## 2024-05-31 DIAGNOSIS — K921 Melena: Secondary | ICD-10-CM | POA: Diagnosis not present

## 2024-05-31 LAB — CBC WITH DIFFERENTIAL/PLATELET
Abs Immature Granulocytes: 0.04 K/uL (ref 0.00–0.07)
Basophils Absolute: 0 K/uL (ref 0.0–0.1)
Basophils Relative: 1 %
Eosinophils Absolute: 0.1 K/uL (ref 0.0–0.5)
Eosinophils Relative: 2 %
HCT: 36.9 % — ABNORMAL LOW (ref 39.0–52.0)
Hemoglobin: 11.9 g/dL — ABNORMAL LOW (ref 13.0–17.0)
Immature Granulocytes: 1 %
Lymphocytes Relative: 3 %
Lymphs Abs: 0.2 K/uL — ABNORMAL LOW (ref 0.7–4.0)
MCH: 32.7 pg (ref 26.0–34.0)
MCHC: 32.2 g/dL (ref 30.0–36.0)
MCV: 101.4 fL — ABNORMAL HIGH (ref 80.0–100.0)
Monocytes Absolute: 0.5 K/uL (ref 0.1–1.0)
Monocytes Relative: 7 %
Neutro Abs: 6 K/uL (ref 1.7–7.7)
Neutrophils Relative %: 86 %
Platelets: 208 K/uL (ref 150–400)
RBC: 3.64 MIL/uL — ABNORMAL LOW (ref 4.22–5.81)
RDW: 12.9 % (ref 11.5–15.5)
WBC: 6.9 K/uL (ref 4.0–10.5)
nRBC: 0 % (ref 0.0–0.2)

## 2024-05-31 LAB — COMPREHENSIVE METABOLIC PANEL WITH GFR
ALT: 15 U/L (ref 0–44)
AST: 21 U/L (ref 15–41)
Albumin: 3.5 g/dL (ref 3.5–5.0)
Alkaline Phosphatase: 60 U/L (ref 38–126)
Anion gap: 9 (ref 5–15)
BUN: 15 mg/dL (ref 8–23)
CO2: 25 mmol/L (ref 22–32)
Calcium: 9 mg/dL (ref 8.9–10.3)
Chloride: 105 mmol/L (ref 98–111)
Creatinine, Ser: 0.9 mg/dL (ref 0.61–1.24)
GFR, Estimated: >60 mL/min
Glucose, Bld: 100 mg/dL — ABNORMAL HIGH (ref 70–99)
Potassium: 4.5 mmol/L (ref 3.5–5.1)
Sodium: 139 mmol/L (ref 135–145)
Total Bilirubin: 0.6 mg/dL (ref 0.0–1.2)
Total Protein: 6.1 g/dL — ABNORMAL LOW (ref 6.5–8.1)

## 2024-05-31 LAB — TYPE AND SCREEN
ABO/RH(D): A POS
Antibody Screen: NEGATIVE

## 2024-05-31 LAB — OCCULT BLOOD X 1 CARD TO LAB, STOOL: Fecal Occult Bld: POSITIVE — AB

## 2024-05-31 NOTE — ED Provider Notes (Signed)
 [facility] EMERGENCY DEPARTMENT AT [facility] Provider Note   CSN: [id number] Arrival date & time: 05/31/24  1611     Patient presents with: Blood In Stools   Nicholas [name] is a 83 y.o. male.   Patient with history of colonic hemorrhage in 1964, Crohns status post several bowel resections, GERD, hyperlipidemia, OSA presents today with complaints of hematochezia. Reports 1 episode earlier this afternoon. Denies any episodes since. Reports that the toilet water  was bright red, no clots present. No abdominal pain, nausea, or vomiting. Reports he has diarrhea but this is normal for him. Reports that he may have had 1 additional episode of melena last week, however he is not sure as it was only slightly darker than normal. Denies fevers or chills. He is not anticoagulated. He is on Imuran  for Crohns.   The history is provided by the patient. No language interpreter was used.       Prior to Admission medications   Medication Sig Start Date End Date Taking? Authorizing Provider  amLODipine (NORVASC) 2.5 MG tablet Take 2.5 mg by mouth daily. 07/17/20   [provider]  aspirin  (ASPIRIN  CHILDRENS) 81 MG chewable tablet Chew 1 tablet (81 mg total) by mouth daily. 10/21/23   Ladona Heinz, MD  azaTHIOprine  (IMURAN ) 50 MG tablet Take 1 tablet by mouth daily. 10/07/21   [provider]  ergocalciferol (VITAMIN D2) 50000 UNITS capsule Take 50,000 Units by mouth once a week.     [provider]  ferrous sulfate 325 (65 FE) MG tablet 1 tablet Oral once daily 01/29/10   [provider]  levothyroxine (SYNTHROID) 50 MCG tablet Take 50 mcg by mouth daily before breakfast.    [provider]  loperamide (IMODIUM) 2 MG capsule Take 2 mg by mouth daily as needed for diarrhea or loose stools. Patient not taking: Reported on 03/17/2024    [provider]  losartan  (COZAAR ) 25 MG tablet TAKE 2 TABLETS (50 MG) BY MOUTH EVERY EVENING. 02/05/22    Cantwell, Celeste C, PA-C  Multiple Vitamin (MULTI VITAMIN DAILY PO) Take 1 tablet by mouth daily.    [provider]  Multiple Vitamins-Minerals (PRESERVISION AREDS 2) CAPS Take by mouth. 1 in the am 1 in the pm    Ladona Heinz, MD  rosuvastatin  (CRESTOR ) 5 MG tablet Take 1 tablet (5 mg total) by mouth daily. 05/03/24   Ladona Heinz, MD  testosterone  cypionate (DEPOTESTOSTERONE CYPIONATE) 200 MG/ML injection Inject 1 mL into the muscle every 14 (fourteen) days. Patient not taking: Reported on 03/17/2024    [provider]    Allergies: Patient has no known allergies.    Review of Systems  Gastrointestinal:  Positive for blood in stool.  All other systems reviewed and are negative.   Updated Vital Signs BP (!) 144/79 (BP Location: Left Arm)   Pulse 62   Temp 97.8 F (36.6 C) (Oral)   Resp 17   Wt 70.8 kg   SpO2 100%   BMI 23.04 kg/m   Physical Exam Vitals and nursing note reviewed. Exam conducted with a chaperone present.  Constitutional:      General: He is not in acute distress.    Appearance: Normal appearance. He is normal weight. He is not ill-appearing, toxic-appearing or diaphoretic.  HENT:     Head: Normocephalic and atraumatic.  Cardiovascular:     Rate and Rhythm: Normal rate.  Pulmonary:     Effort: Pulmonary effort is normal. No respiratory distress.  Abdominal:     General: Abdomen is flat.     Palpations: Abdomen is soft.     Tenderness: There is no abdominal tenderness.  Genitourinary:    Comments: No obvious blood or stool in the rectal vault. No tenderness, fissures, or hemorrhoids visualized Musculoskeletal:        General: Normal range of motion.     Cervical back: Normal range of motion.  Skin:    General: Skin is warm and dry.  Neurological:     General: No focal deficit present.     Mental Status: He is alert.  Psychiatric:        Mood and Affect: Mood normal.        Behavior: Behavior normal.     (all labs ordered are listed,  but only abnormal results are displayed) Labs Reviewed  CBC WITH DIFFERENTIAL/PLATELET - Abnormal; Notable for the following components:      Result Value   RBC 3.64 (*)    Hemoglobin 11.9 (*)    HCT 36.9 (*)    MCV 101.4 (*)    Lymphs Abs 0.2 (*)    All other components within normal limits  COMPREHENSIVE METABOLIC PANEL WITH GFR - Abnormal; Notable for the following components:   Glucose, Bld 100 (*)    Total Protein 6.1 (*)    All other components within normal limits  OCCULT BLOOD X 1 CARD TO LAB, STOOL - Abnormal; Notable for the following components:   Fecal Occult Bld POSITIVE (*)    All other components within normal limits  TYPE AND SCREEN    EKG: None  Radiology: No results found.   Procedures   Medications Ordered in the ED - No data to display                                  Medical Decision Making Amount and/or Complexity of Data Reviewed Labs: ordered.   This patient is a 83 y.o. male who presents to the ED for concern of hematochezia, this involves an extensive number of treatment options, and is a complaint that carries with it a high risk of complications and morbidity. The emergent differential diagnosis prior to evaluation includes, but is not limited to,  The differential diagnosis for lower GI bleed includes but is not limited to high flow upper GI bleed, diverticulosis, vascular ectasia/AVM, inflammatory bowel disease, infectious colitis, mesenteric ischemia or ischemic colitis,  colorectal cancer or polyps, internal hemorrhoids, aortoenteric fistula, rectal foreign body, rectal ulceration or anal fissure.  This is not an exhaustive differential.   Past Medical History / Co-morbidities / Social History:  has a past medical history of Anemia, Aortic stenosis, mild, Cataract, Colonic hemorrhage (11/10/1962), Crohn's disease (HCC), GERD (gastroesophageal reflux disease), Heart murmur, Hyperlipidemia, Macular degeneration, PAC (premature atrial  contraction), Palpitations, Sepsis (HCC) (02/23/2015), SIRS (systemic inflammatory response syndrome) (HCC) (02/23/2015), Sleep apnea, obstructive (08/28/2020), and Squamous cell skin cancer.  Additional history: Chart reviewed. Pertinent results include: followed by [facility] for his crohns disease, hx total colectomy with ileorectal anastomosis.  Currently on Imuran .  Physical Exam: Physical exam performed. The pertinent findings include: Patient is well-appearing, abdomen soft and nontender, rectal vault without obvious abnormality or bleeding.  Lab Tests: I ordered, and personally interpreted labs.  The pertinent results include:  hgb 11.9 (down from 13.5 3 months ago). Hemoccult positive  Consultations Obtained: I requested consultation with the GI on call Dr. Kriss,  and discussed lab and imaging findings as well as pertinent plan - they recommend: given no pain, no anticoagulation, stable vital signs and hemoglobin, and patient with good outpatient follow-up, GI recommends discharge with close outpatient GI follow-up and close return precautions   Disposition: After consideration of the diagnostic results and the patients response to treatment, I feel that emergency department workup does not suggest an emergent condition requiring admission or immediate intervention beyond what has been performed at this time. The plan is: Discharge with close outpatient GI follow-up and close return precautions per GI on-call's recommendations.  Patient is well-appearing, stable hemoglobin and stable vital signs.  He does not have any frank blood on exam and has no abdominal tenderness.  Discussing with patient, patient would like to go home.  He has only had 1 episode of hematochezia.  Discussed strict return precautions. Evaluation and diagnostic testing in the emergency department does not suggest an emergent condition requiring admission or immediate intervention beyond what has been performed at this  time.  Plan for discharge with close PCP follow-up.  Patient is understanding and amenable with plan, educated on red flag symptoms that would prompt immediate return.  Patient discharged in stable condition.   I discussed this case with my attending physician Dr. Francesca who cosigned this note including patient's presenting symptoms, physical exam, and planned diagnostics and interventions. Attending physician stated agreement with plan or made changes to plan which were implemented.    Final diagnoses:  Blood in stool    ED Discharge Orders     None     An After Visit Summary was printed and given to the patient.      Nora Lauraine DELENA DEVONNA 05/31/24 [id number]    Francesca Elsie CROME, MD 05/31/24 2236

## 2024-05-31 NOTE — ED Notes (Signed)
 Hemoccult at bedside for provider

## 2024-05-31 NOTE — Discharge Instructions (Addendum)
 As we discussed, your workup in the ER today was overall reassuring for acute findings.  Your hemoglobin has a mild drop, however given you have good outpatient follow-up with your GI specialist and have only had 1 episode of bleeding, no further evaluation is indicated per our GI team on-call.  Please call your GI specialist tomorrow to schedule a close outpatient follow-up.  Call your PCP as well.  Is very important that you have your hemoglobin monitored outpatient.  Return if you continue to have large-volume episodes of blood in your stool or any abdominal pain, or any new or worsening symptoms.

## 2024-05-31 NOTE — ED Triage Notes (Signed)
 POV/ with wife/ c/o bright red blood in stool/ hx of same/ sent by PCP/ pt c/o fatigue/ unsure of last HGB level/ pt is ambulatory/ A&OX4

## 2024-06-03 DIAGNOSIS — I480 Paroxysmal atrial fibrillation: Secondary | ICD-10-CM | POA: Diagnosis not present

## 2024-06-03 DIAGNOSIS — R634 Abnormal weight loss: Secondary | ICD-10-CM | POA: Diagnosis not present

## 2024-06-03 DIAGNOSIS — I10 Essential (primary) hypertension: Secondary | ICD-10-CM | POA: Diagnosis not present

## 2024-06-03 DIAGNOSIS — K921 Melena: Secondary | ICD-10-CM | POA: Diagnosis not present

## 2024-06-03 DIAGNOSIS — Z79899 Other long term (current) drug therapy: Secondary | ICD-10-CM | POA: Diagnosis not present

## 2024-06-03 DIAGNOSIS — K5090 Crohn's disease, unspecified, without complications: Secondary | ICD-10-CM | POA: Diagnosis not present

## 2024-06-03 DIAGNOSIS — K625 Hemorrhage of anus and rectum: Secondary | ICD-10-CM | POA: Diagnosis not present

## 2024-06-03 DIAGNOSIS — R7301 Impaired fasting glucose: Secondary | ICD-10-CM | POA: Diagnosis not present

## 2024-06-06 DIAGNOSIS — L578 Other skin changes due to chronic exposure to nonionizing radiation: Secondary | ICD-10-CM | POA: Diagnosis not present

## 2024-06-06 DIAGNOSIS — L244 Irritant contact dermatitis due to drugs in contact with skin: Secondary | ICD-10-CM | POA: Diagnosis not present

## 2024-06-07 ENCOUNTER — Encounter: Payer: Self-pay | Admitting: Podiatry

## 2024-06-07 ENCOUNTER — Ambulatory Visit (INDEPENDENT_AMBULATORY_CARE_PROVIDER_SITE_OTHER): Admitting: Podiatry

## 2024-06-07 DIAGNOSIS — M79676 Pain in unspecified toe(s): Secondary | ICD-10-CM | POA: Diagnosis not present

## 2024-06-07 DIAGNOSIS — B351 Tinea unguium: Secondary | ICD-10-CM

## 2024-06-07 NOTE — Progress Notes (Signed)
 Subjective:  Patient ID: Nicholas [name], male    DOB: 11-21-1940,  MRN: [id number] HPI Chief Complaint  Patient presents with   Nail Problem    1st right/5th left - toenails thick and discolored, hard to cut all the nails himself, especially those   New Patient (Initial Visit)    83 y.o. male presents with the above complaint.   ROS: He denies fever chills nausea mobic muscle aches pains calf pain back pain chest pain shortness of breath.  Past Medical History:  Diagnosis Date   Anemia    PO IRON    Aortic stenosis, mild    Cataract    Colonic hemorrhage 11/10/1962   Crohn's disease (HCC)    GERD (gastroesophageal reflux disease)    Heart murmur    Hyperlipidemia    Macular degeneration    PAC (premature atrial contraction)    Palpitations    Sepsis (HCC) 02/23/2015   SIRS (systemic inflammatory response syndrome) (HCC) 02/23/2015   Sleep apnea, obstructive 08/28/2020   on CPAP   Squamous cell skin cancer    Past Surgical History:  Procedure Laterality Date   APPENDECTOMY     Biopsy Facial  05/10/2019   BLADDER REPAIR  1964   intestine leaks/bladder repair   CATARACT EXTRACTION W/ INTRAOCULAR LENS  IMPLANT, BILATERAL     2013   ILEOSTOMY  04/16/2012   Procedure: ILEOSTOMY;  Surgeon: Elon CHRISTELLA Pacini, MD;  Location: [facility] OR;  Service: General;  Laterality: N/A;   ILEOSTOMY CLOSURE  12/07/2012   ILEOSTOMY CLOSURE  12/07/2012   Procedure: ILEOSTOMY TAKEDOWN;  Surgeon: Elon CHRISTELLA Pacini, MD;  Location: [facility] OR;  Service: General;  Laterality: N/A;   LAPAROTOMY  04/16/2012   Procedure: EXPLORATORY LAPAROTOMY;  Surgeon: Elon CHRISTELLA Pacini, MD;  Location: [facility] OR;  Service: General;  Laterality: N/A;   LYSIS OF ADHESION  12/07/2012   Procedure: LYSIS OF ADHESION;  Surgeon: Elon CHRISTELLA Pacini, MD;  Location: [facility] OR;  Service: General;  Laterality: N/A;   PROCTOSCOPY  12/07/2012   Procedure: PROCTOSCOPY;  Surgeon: Elon CHRISTELLA Pacini, MD;  Location: [facility] OR;  Service: General;;  ILEO  PROCTOSCOPY   SKIN CANCER EXCISION     STOMACH SURGERY  1959   ulcer   TONSILLECTOMY      Current Outpatient Medications:    memantine (NAMENDA) 5 MG tablet, Take by mouth., Disp: , Rfl:    mupirocin ointment (BACTROBAN) 2 %, SMARTSIG:1 Application Topical 2-3 Times Daily, Disp: , Rfl:    amLODipine (NORVASC) 2.5 MG tablet, Take 2.5 mg by mouth daily., Disp: , Rfl:    aspirin  (ASPIRIN  CHILDRENS) 81 MG chewable tablet, Chew 1 tablet (81 mg total) by mouth daily., Disp: , Rfl:    azaTHIOprine  (IMURAN ) 50 MG tablet, Take 1 tablet by mouth daily., Disp: , Rfl:    ergocalciferol (VITAMIN D2) 50000 UNITS capsule, Take 50,000 Units by mouth once a week. , Disp: , Rfl:    ferrous sulfate 325 (65 FE) MG tablet, 1 tablet Oral once daily, Disp: , Rfl:    levothyroxine (SYNTHROID) 50 MCG tablet, Take 50 mcg by mouth daily before breakfast., Disp: , Rfl:    loperamide (IMODIUM) 2 MG capsule, Take 2 mg by mouth daily as needed for diarrhea or loose stools. (Patient not taking: Reported on 03/17/2024), Disp: , Rfl:    losartan  (COZAAR ) 25 MG tablet, TAKE 2 TABLETS (50 MG) BY MOUTH EVERY EVENING., Disp: 180 tablet, Rfl: 1   meclizine (ANTIVERT) 12.5 MG  tablet, Take 12.5 mg by mouth every 8 (eight) hours as needed., Disp: , Rfl:    Multiple Vitamin (MULTI VITAMIN DAILY PO), Take 1 tablet by mouth daily., Disp: , Rfl:    Multiple Vitamins-Minerals (PRESERVISION AREDS 2) CAPS, Take by mouth. 1 in the am 1 in the pm, Disp: , Rfl:    rosuvastatin  (CRESTOR ) 5 MG tablet, Take 1 tablet (5 mg total) by mouth daily., Disp: 90 tablet, Rfl: 1   testosterone  cypionate (DEPOTESTOSTERONE CYPIONATE) 200 MG/ML injection, Inject 1 mL into the muscle every 14 (fourteen) days. (Patient not taking: Reported on 03/17/2024), Disp: , Rfl:   No Known Allergies Review of Systems Objective:  There were no vitals filed for this visit.  General: Well developed, nourished, in no acute distress, alert and oriented x3   Dermatological:  Skin is warm, dry and supple bilateral. Nails x 10 are well maintained.  Multiple toes are thick yellow dystrophic nail plates.; remaining integument appears unremarkable at this time. There are no open sores, no preulcerative lesions, no rash or signs of infection present.  Vascular: Dorsalis Pedis artery and Posterior Tibial artery pedal pulses are 2/4 bilateral with immedate capillary fill time. Pedal hair growth present. No varicosities and no lower extremity edema present bilateral.   Neruologic: Grossly intact via light touch bilateral. Vibratory intact via tuning fork bilateral. Protective threshold with Semmes Wienstein monofilament intact to all pedal sites bilateral. Patellar and Achilles deep tendon reflexes 2+ bilateral. No Babinski or clonus noted bilateral.   Musculoskeletal: No gross boney pedal deformities bilateral. No pain, crepitus, or limitation noted with foot and ankle range of motion bilateral. Muscular strength 5/5 in all groups tested bilateral.  Gait: Unassisted, Nonantalgic.    Radiographs:  None taken  Assessment & Plan:   Assessment: Pain limb secondary to onychomycosis.  Plan: Discussed etiology pathology conservative surgical therapies debrided nails 1 through 5 bilateral Secondary to Pain and Discomfort Today.  Follow-Up with Dr. Maritza or Dr. May for Routine Nail Debridement.     Tiffannie Sloss T. [place], [place]

## 2024-06-08 DIAGNOSIS — R42 Dizziness and giddiness: Secondary | ICD-10-CM | POA: Diagnosis not present

## 2024-06-08 DIAGNOSIS — R2681 Unsteadiness on feet: Secondary | ICD-10-CM | POA: Diagnosis not present

## 2024-06-15 DIAGNOSIS — R42 Dizziness and giddiness: Secondary | ICD-10-CM | POA: Diagnosis not present

## 2024-06-15 DIAGNOSIS — R2681 Unsteadiness on feet: Secondary | ICD-10-CM | POA: Diagnosis not present

## 2024-06-16 DIAGNOSIS — K50813 Crohn's disease of both small and large intestine with fistula: Secondary | ICD-10-CM | POA: Diagnosis not present

## 2024-06-20 DIAGNOSIS — K50813 Crohn's disease of both small and large intestine with fistula: Secondary | ICD-10-CM | POA: Diagnosis not present

## 2024-06-22 DIAGNOSIS — R42 Dizziness and giddiness: Secondary | ICD-10-CM | POA: Diagnosis not present

## 2024-06-22 DIAGNOSIS — R2681 Unsteadiness on feet: Secondary | ICD-10-CM | POA: Diagnosis not present

## 2024-06-29 DIAGNOSIS — R2681 Unsteadiness on feet: Secondary | ICD-10-CM | POA: Diagnosis not present

## 2024-06-29 DIAGNOSIS — R42 Dizziness and giddiness: Secondary | ICD-10-CM | POA: Diagnosis not present

## 2024-07-06 DIAGNOSIS — R42 Dizziness and giddiness: Secondary | ICD-10-CM | POA: Diagnosis not present

## 2024-07-06 DIAGNOSIS — R2681 Unsteadiness on feet: Secondary | ICD-10-CM | POA: Diagnosis not present

## 2024-07-07 DIAGNOSIS — I10 Essential (primary) hypertension: Secondary | ICD-10-CM | POA: Diagnosis not present

## 2024-07-07 DIAGNOSIS — R413 Other amnesia: Secondary | ICD-10-CM | POA: Diagnosis not present

## 2024-07-07 DIAGNOSIS — I131 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, with stage 1 through stage 4 chronic kidney disease, or unspecified chronic kidney disease: Secondary | ICD-10-CM | POA: Diagnosis not present

## 2024-07-07 DIAGNOSIS — R5383 Other fatigue: Secondary | ICD-10-CM | POA: Diagnosis not present

## 2024-07-07 DIAGNOSIS — K921 Melena: Secondary | ICD-10-CM | POA: Diagnosis not present

## 2024-07-07 DIAGNOSIS — K5090 Crohn's disease, unspecified, without complications: Secondary | ICD-10-CM | POA: Diagnosis not present

## 2024-07-07 DIAGNOSIS — I480 Paroxysmal atrial fibrillation: Secondary | ICD-10-CM | POA: Diagnosis not present

## 2024-07-07 DIAGNOSIS — R0609 Other forms of dyspnea: Secondary | ICD-10-CM | POA: Diagnosis not present

## 2024-07-26 ENCOUNTER — Encounter: Payer: Self-pay | Admitting: Adult Health

## 2024-07-26 ENCOUNTER — Ambulatory Visit: Payer: PPO | Admitting: Adult Health

## 2024-07-26 VITALS — BP 91/56 | HR 76 | Ht 69.0 in | Wt 151.0 lb

## 2024-07-26 DIAGNOSIS — G4733 Obstructive sleep apnea (adult) (pediatric): Secondary | ICD-10-CM | POA: Diagnosis not present

## 2024-07-26 DIAGNOSIS — H8110 Benign paroxysmal vertigo, unspecified ear: Secondary | ICD-10-CM

## 2024-07-26 DIAGNOSIS — R413 Other amnesia: Secondary | ICD-10-CM | POA: Diagnosis not present

## 2024-07-26 NOTE — Progress Notes (Signed)
 PATIENT: Nicholas [name] DOB: [birth year]  REASON FOR VISIT: follow up HISTORY FROM: patient PRIMARY NEUROLOGIST: Dr. Buck  Chief Complaint  Patient presents with   Follow-up    Pt in 18 with wife Pt here for cpap f/u Pt states unable to wear mask  due to skin cancer on head . Pt states received new mask and will try to use again tonight Pt states having episodes of vertigo      HISTORY OF PRESENT ILLNESS: Today 07/26/24:  Nicholas [name] is a 83 y.o. male with a history of obstructive sleep apnea on CPAP. Returns today for follow-up.  He reports that he is having using the CPAP due to having skin cancer in his head.  He does plan to restart this.  He feels that his memory has overall remained stable.  At home he is able to complete all ADLs independently.  He is no longer driving due to his vision.  His wife helps him with his medications, appointments and finances.  He refused any memory testing today.  He has a history of benign positional vertigo.  He states that he is been having some episodes today.  Reports that his PCP has called him in meclizine.  He returns today for an evaluation.   07/20/24: Nicholas [name] is a 83 y.o. male with a history of OSA on CPAP. Returns today for follow-up. Reports that he was using the CPAP consistently but had skin cancer removed from the top of his head.  He states he was unable to use it for a couple weeks however he is now back on the CPAP machine.  He denies any new issues.  His download is below  He does report during the visit that he has noticed some changes with his memory.  He has not discussed with his primary care to his knowledge.     05/08/22: [name] is an 83 year old male with a history of obstructive sleep apnea on CPAP.  He returns today for follow-up.  He reports that the CPAP is working fairly well for him.  At the last visit his pressure was reduced to 11 and his AHI has decreased.  He still reports daytime fatigue but has  been discussing this with his PCP.  His download is below   REVIEW OF SYSTEMS: Out of a complete 14 system review of symptoms, the patient complains only of the following symptoms, and all other reviewed systems are negative.   ESS 12  ALLERGIES: No Known Allergies  HOME MEDICATIONS: Outpatient Medications Prior to Visit  Medication Sig Dispense Refill   amLODipine (NORVASC) 2.5 MG tablet Take 2.5 mg by mouth daily.     aspirin  (ASPIRIN  CHILDRENS) 81 MG chewable tablet Chew 1 tablet (81 mg total) by mouth daily.     azaTHIOprine  (IMURAN ) 50 MG tablet Take 1 tablet by mouth daily.     ergocalciferol (VITAMIN D2) 50000 UNITS capsule Take 50,000 Units by mouth once a week.      ferrous sulfate 325 (65 FE) MG tablet 1 tablet Oral once daily     levothyroxine (SYNTHROID) 50 MCG tablet Take 50 mcg by mouth daily before breakfast.     loperamide (IMODIUM) 2 MG capsule Take 2 mg by mouth daily as needed for diarrhea or loose stools. (Patient taking differently: Take 2 mg by mouth as needed for diarrhea or loose stools.)     losartan  (COZAAR ) 25 MG tablet TAKE 2 TABLETS (50 MG) BY MOUTH  EVERY EVENING. 180 tablet 1   meclizine (ANTIVERT) 12.5 MG tablet Take 12.5 mg by mouth every 8 (eight) hours as needed.     memantine (NAMENDA) 5 MG tablet Take by mouth.     Multiple Vitamin (MULTI VITAMIN DAILY PO) Take 1 tablet by mouth daily.     Multiple Vitamins-Minerals (PRESERVISION AREDS 2) CAPS Take by mouth. 1 in the am 1 in the pm     mupirocin ointment (BACTROBAN) 2 % SMARTSIG:1 Application Topical 2-3 Times Daily     rosuvastatin  (CRESTOR ) 5 MG tablet Take 1 tablet (5 mg total) by mouth daily. 90 tablet 1   testosterone  cypionate (DEPOTESTOSTERONE CYPIONATE) 200 MG/ML injection Inject 1 mL into the muscle every 14 (fourteen) days.     No facility-administered medications prior to visit.    PAST MEDICAL HISTORY: Past Medical History:  Diagnosis Date   Anemia    PO IRON    Aortic stenosis,  mild    Cataract    Colonic hemorrhage 11/10/1962   Crohn's disease (HCC)    GERD (gastroesophageal reflux disease)    Heart murmur    Hyperlipidemia    Macular degeneration    PAC (premature atrial contraction)    Palpitations    Sepsis (HCC) 02/23/2015   SIRS (systemic inflammatory response syndrome) (HCC) 02/23/2015   Sleep apnea, obstructive 08/28/2020   on CPAP   Squamous cell skin cancer     PAST SURGICAL HISTORY: Past Surgical History:  Procedure Laterality Date   APPENDECTOMY     Biopsy Facial  05/10/2019   BLADDER REPAIR  1964   intestine leaks/bladder repair   CATARACT EXTRACTION W/ INTRAOCULAR LENS  IMPLANT, BILATERAL     2013   ILEOSTOMY  04/16/2012   Procedure: ILEOSTOMY;  Surgeon: Elon CHRISTELLA Pacini, MD;  Location: [facility];  Service: General;  Laterality: N/A;   ILEOSTOMY CLOSURE  12/07/2012   ILEOSTOMY CLOSURE  12/07/2012   Procedure: ILEOSTOMY TAKEDOWN;  Surgeon: Elon CHRISTELLA Pacini, MD;  Location: [facility] OR;  Service: General;  Laterality: N/A;   LAPAROTOMY  04/16/2012   Procedure: EXPLORATORY LAPAROTOMY;  Surgeon: Elon CHRISTELLA Pacini, MD;  Location: [facility] OR;  Service: General;  Laterality: N/A;   LYSIS OF ADHESION  12/07/2012   Procedure: LYSIS OF ADHESION;  Surgeon: Elon CHRISTELLA Pacini, MD;  Location: [facility] OR;  Service: General;  Laterality: N/A;   PROCTOSCOPY  12/07/2012   Procedure: PROCTOSCOPY;  Surgeon: Elon CHRISTELLA Pacini, MD;  Location: [facility] OR;  Service: General;;  ILEO PROCTOSCOPY   SKIN CANCER EXCISION     STOMACH SURGERY  1959   ulcer   TONSILLECTOMY      FAMILY HISTORY: Family History  Problem Relation Age of Onset   Stroke Mother    Multiple myeloma Father    Heart disease Maternal Grandfather    Heart attack Daughter    Congestive Heart Failure Daughter    Heart disease Other        maternal side   Colon cancer Neg Hx    Crohn's disease Neg Hx    Sleep apnea Neg Hx     SOCIAL HISTORY: Social History   Socioeconomic History   Marital status: Married     Spouse name: Not on file   Number of children: 3   Years of education: Not on file   Highest education level: Not on file  Occupational History   Occupation: Retired    Associate Professor: [employer]  Tobacco Use   Smoking status: Former  Current packs/day: 0.00    Average packs/day: 1 pack/day for 2.0 years (2.0 ttl pk-yrs)    Types: Cigarettes    Start date: 11/10/1962    Quit date: 11/10/1964    Years since quitting: 59.7   Smokeless tobacco: Never  Vaping Use   Vaping status: Never Used  Substance and Sexual Activity   Alcohol  use: No   Drug use: No   Sexual activity: Not on file  Other Topics Concern   Not on file  Social History Narrative   caffeinated drinks less than one a day. Eldest daughter passed from heart deficiency.    Retired    Lives with family    Social Drivers of Corporate investment banker Strain: Not on file  Food Insecurity: Not on file  Transportation Needs: Not on file  Physical Activity: Not on file  Stress: Not on file  Social Connections: Not on file  Intimate Partner Violence: Not on file      PHYSICAL EXAM  Vitals:   07/26/24 1042  BP: (!) 91/56  Pulse: 76  Weight: 151 lb (68.5 kg)  Height: 5' 9 (1.753 m)    Body mass index is 22.3 kg/m.  Generalized: Well developed, in no acute distress  Chest: Lungs clear to auscultation bilaterally  Neurological examination  Mentation: Alert oriented to time, place, history taking. Follows all commands speech and language fluent Cranial nerve II-XII: Facial symmetry noted  DIAGNOSTIC DATA (LABS, IMAGING, TESTING) - I reviewed patient records, labs, notes, testing and imaging myself where available.  Lab Results  Component Value Date   WBC 6.9 05/31/2024   HGB 11.9 (L) 05/31/2024   HCT 36.9 (L) 05/31/2024   MCV 101.4 (H) 05/31/2024   PLT 208 05/31/2024      Component Value Date/Time   NA 139 05/31/2024 1641   NA 139 04/26/2020 1506   K 4.5 05/31/2024 1641   CL 105 05/31/2024  1641   CO2 25 05/31/2024 1641   GLUCOSE 100 (H) 05/31/2024 1641   BUN 15 05/31/2024 1641   BUN 13 04/26/2020 1506   CREATININE 0.90 05/31/2024 1641   CREATININE 1.06 05/18/2012 1151   CALCIUM  9.0 05/31/2024 1641   PROT 6.1 (L) 05/31/2024 1641   ALBUMIN  3.5 05/31/2024 1641   AST 21 05/31/2024 1641   ALT 15 05/31/2024 1641   ALKPHOS 60 05/31/2024 1641   BILITOT 0.6 05/31/2024 1641   GFRNONAA >60 05/31/2024 1641   GFRAA 61 04/26/2020 1506   Lab Results  Component Value Date   CHOL 60 04/19/2012   TRIG 103 04/19/2012    Lab Results  Component Value Date   TSH 4.742 (H) 12/11/2023      ASSESSMENT AND PLAN 83 y.o. year old male  has a past medical history of Anemia, Aortic stenosis, mild, Cataract, Colonic hemorrhage (11/10/1962), Crohn's disease (HCC), GERD (gastroesophageal reflux disease), Heart murmur, Hyperlipidemia, Macular degeneration, PAC (premature atrial contraction), Palpitations, Sepsis (HCC) (02/23/2015), SIRS (systemic inflammatory response syndrome) (HCC) (02/23/2015), Sleep apnea, obstructive (08/28/2020), and Squamous cell skin cancer. here with:  OSA on CPAP Benign positional vertigo Memory disturbance  Encourage patient to restart CPAP therapy Encouraged him to use nightly and greater than 4 hours each night Advised that if vertigo does not resolve with meclizine we can consider referral to vestibular rehab he can discuss with his PCP or our office He feels that his memory has remained stable.  Does not wish to do memory testing today Follow-up in 1 year or sooner  if needed    Duwaine Russell, MSN, NP-C 07/26/2024, 10:48 AM [facility] [address] [place], [place] [address] [phone]  The patient's condition requires frequent monitoring and adjustments in the treatment plan, reflecting the ongoing complexity of care.  This provider is the continuing focal point for all needed services for this condition.

## 2024-07-26 NOTE — Patient Instructions (Signed)
 Your Plan:  Restart CPAP therapy Certainly if vertigo does not improve we can consider vestibular rehab If your symptoms worsen or you develop new symptoms please let us  know.   Thank you for coming to see us  at [facility]. I hope we have been able to provide you high quality care today.  You may receive a patient satisfaction survey over the next few weeks. We would appreciate your feedback and comments so that we may continue to improve ourselves and the health of our patients.

## 2024-07-27 DIAGNOSIS — H6123 Impacted cerumen, bilateral: Secondary | ICD-10-CM | POA: Diagnosis not present

## 2024-07-27 DIAGNOSIS — R42 Dizziness and giddiness: Secondary | ICD-10-CM | POA: Diagnosis not present

## 2024-08-09 DIAGNOSIS — H353132 Nonexudative age-related macular degeneration, bilateral, intermediate dry stage: Secondary | ICD-10-CM | POA: Diagnosis not present

## 2024-08-17 DIAGNOSIS — H6123 Impacted cerumen, bilateral: Secondary | ICD-10-CM | POA: Diagnosis not present

## 2024-08-17 DIAGNOSIS — R42 Dizziness and giddiness: Secondary | ICD-10-CM | POA: Diagnosis not present

## 2024-08-22 DIAGNOSIS — L821 Other seborrheic keratosis: Secondary | ICD-10-CM | POA: Diagnosis not present

## 2024-08-22 DIAGNOSIS — L814 Other melanin hyperpigmentation: Secondary | ICD-10-CM | POA: Diagnosis not present

## 2024-08-22 DIAGNOSIS — L570 Actinic keratosis: Secondary | ICD-10-CM | POA: Diagnosis not present

## 2024-08-22 DIAGNOSIS — C44612 Basal cell carcinoma of skin of right upper limb, including shoulder: Secondary | ICD-10-CM | POA: Diagnosis not present

## 2024-08-22 DIAGNOSIS — D225 Melanocytic nevi of trunk: Secondary | ICD-10-CM | POA: Diagnosis not present

## 2024-08-25 DIAGNOSIS — I480 Paroxysmal atrial fibrillation: Secondary | ICD-10-CM | POA: Diagnosis not present

## 2024-08-25 DIAGNOSIS — R413 Other amnesia: Secondary | ICD-10-CM | POA: Diagnosis not present

## 2024-08-25 DIAGNOSIS — R7301 Impaired fasting glucose: Secondary | ICD-10-CM | POA: Diagnosis not present

## 2024-08-25 DIAGNOSIS — Z23 Encounter for immunization: Secondary | ICD-10-CM | POA: Diagnosis not present

## 2024-08-25 DIAGNOSIS — G4733 Obstructive sleep apnea (adult) (pediatric): Secondary | ICD-10-CM | POA: Diagnosis not present

## 2024-08-25 DIAGNOSIS — G319 Degenerative disease of nervous system, unspecified: Secondary | ICD-10-CM | POA: Diagnosis not present

## 2024-08-25 DIAGNOSIS — R5383 Other fatigue: Secondary | ICD-10-CM | POA: Diagnosis not present

## 2024-09-07 ENCOUNTER — Ambulatory Visit: Admitting: Podiatry

## 2024-09-07 ENCOUNTER — Encounter: Payer: Self-pay | Admitting: Podiatry

## 2024-09-07 DIAGNOSIS — M79676 Pain in unspecified toe(s): Secondary | ICD-10-CM | POA: Diagnosis not present

## 2024-09-07 DIAGNOSIS — N1831 Chronic kidney disease, stage 3a: Secondary | ICD-10-CM | POA: Diagnosis not present

## 2024-09-07 DIAGNOSIS — I739 Peripheral vascular disease, unspecified: Secondary | ICD-10-CM | POA: Diagnosis not present

## 2024-09-07 DIAGNOSIS — B351 Tinea unguium: Secondary | ICD-10-CM | POA: Diagnosis not present

## 2024-09-07 NOTE — Progress Notes (Signed)
 This patient returns to my office for at risk foot care.  This patient requires this care by a professional since this patient will be at risk due to having PAD and CKD.  This patient is unable to cut nails himself since the patient cannot reach his nails.These nails are painful walking and wearing shoes.  This patient presents for at risk foot care today.  General Appearance  Alert, conversant and in no acute stress.  Vascular  Dorsalis pedis and posterior tibial  pulses are weakly  palpable  bilaterally.  Capillary return is within normal limits  bilaterally. Temperature is within normal limits  bilaterally.  Neurologic  Senn-Weinstein monofilament wire test within normal limits  bilaterally. Muscle power within normal limits bilaterally.  Nails Thick disfigured discolored nails with subungual debris  from hallux to fifth toes bilaterally. No evidence of bacterial infection or drainage bilaterally.  Orthopedic  No limitations of motion  feet .  No crepitus or effusions noted.  No bony pathology or digital deformities noted.  Skin  normotropic skin with no porokeratosis noted bilaterally.  No signs of infections or ulcers noted.     Onychomycosis  Pain in right toes  Pain in left toes  Consent was obtained for treatment procedures.   Mechanical debridement of nails 1-5  bilaterally performed with a nail nipper.  Filed with dremel without incident.    Return office visit  3 months                    Told patient to return for periodic foot care and evaluation due to potential at risk complications.   Cordella Bold DPM  tomma

## 2024-09-08 ENCOUNTER — Ambulatory Visit: Attending: Physical Medicine and Rehabilitation

## 2024-09-08 VITALS — BP 123/52 | HR 70

## 2024-09-08 DIAGNOSIS — R2681 Unsteadiness on feet: Secondary | ICD-10-CM | POA: Diagnosis not present

## 2024-09-08 DIAGNOSIS — R42 Dizziness and giddiness: Secondary | ICD-10-CM | POA: Insufficient documentation

## 2024-09-08 NOTE — Therapy (Signed)
 OUTPATIENT PHYSICAL THERAPY VESTIBULAR EVALUATION     Patient Name: Nicholas [name] MRN: [id number] DOB:Apr 01, 1941, 83 y.o., male Today's Date: 09/08/2024  END OF SESSION:  PT End of Session - 09/08/24 0838     Visit Number 1    Number of Visits 9    Date for Recertification  10/07/24    Authorization Type HTA    PT Start Time 0845    PT Stop Time 0927    PT Time Calculation (min) 42 min    Activity Tolerance Patient tolerated treatment well    Behavior During Therapy [facility] for tasks assessed/performed;Flat affect          Past Medical History:  Diagnosis Date   Anemia    PO IRON    Aortic stenosis, mild    Cataract    Colonic hemorrhage 11/10/1962   Crohn's disease (HCC)    GERD (gastroesophageal reflux disease)    Heart murmur    Hyperlipidemia    Macular degeneration    PAC (premature atrial contraction)    Palpitations    Sepsis (HCC) 02/23/2015   SIRS (systemic inflammatory response syndrome) (HCC) 02/23/2015   Sleep apnea, obstructive 08/28/2020   on CPAP   Squamous cell skin cancer    Past Surgical History:  Procedure Laterality Date   APPENDECTOMY     Biopsy Facial  05/10/2019   BLADDER REPAIR  1964   intestine leaks/bladder repair   CATARACT EXTRACTION W/ INTRAOCULAR LENS  IMPLANT, BILATERAL     2013   ILEOSTOMY  04/16/2012   Procedure: ILEOSTOMY;  Surgeon: Elon CHRISTELLA Pacini, MD;  Location: [facility] OR;  Service: General;  Laterality: N/A;   ILEOSTOMY CLOSURE  12/07/2012   ILEOSTOMY CLOSURE  12/07/2012   Procedure: ILEOSTOMY TAKEDOWN;  Surgeon: Elon CHRISTELLA Pacini, MD;  Location: [facility] OR;  Service: General;  Laterality: N/A;   LAPAROTOMY  04/16/2012   Procedure: EXPLORATORY LAPAROTOMY;  Surgeon: Elon CHRISTELLA Pacini, MD;  Location: [facility] OR;  Service: General;  Laterality: N/A;   LYSIS OF ADHESION  12/07/2012   Procedure: LYSIS OF ADHESION;  Surgeon: Elon CHRISTELLA Pacini, MD;  Location: [facility] OR;  Service: General;  Laterality: N/A;   PROCTOSCOPY  12/07/2012   Procedure:  PROCTOSCOPY;  Surgeon: Elon CHRISTELLA Pacini, MD;  Location: [facility] OR;  Service: General;;  ILEO PROCTOSCOPY   SKIN CANCER EXCISION     STOMACH SURGERY  1959   ulcer   TONSILLECTOMY     Patient Active Problem List   Diagnosis Date Noted   Dizziness 04/26/2024   Ear itch 04/26/2024   Abnormal weight loss 03/16/2024   Chronic kidney disease, stage 3a (HCC) 03/16/2024   Daytime somnolence 03/16/2024   Herpes zoster without complication 03/16/2024   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, with stage 1 through stage 4 chronic kidney disease, or unspecified chronic kidney disease 03/16/2024   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   Memory loss 03/16/2024   Obstructive sleep apnea 03/16/2024   Other long term (current) drug therapy 03/16/2024   PAD (peripheral artery disease) 03/16/2024   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   Common bile duct dilation 06/13/2021   Essential hypertension 04/16/2020   Paronychia of left index finger 02/03/2020   Cellulitis of finger of left hand 01/11/2020   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   Insomnia 10/03/2019  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   Encounter for screening for other disorder 09/21/2018   Pain in right knee 09/21/2018   Right knee pain 09/21/2018   Encounter for general adult medical examination without abnormal findings 09/14/2018   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   Ganglion cyst of dorsum of left wrist 07/14/2018   Ganglion of right wrist 07/01/2018   Dementia (HCC) 09/14/2017   Benign paroxysmal positional vertigo 05/28/2017   Bilateral hearing loss 05/28/2017   Disequilibrium 07/16/2016   Hemorrhage of rectum and anus 04/21/2016   Sepsis (HCC) 02/28/2015   Impaired fasting glucose 09/04/2014   Vitamin D deficiency 09/04/2014   Impacted  cerumen of right ear 05/30/2014   Disorder of skin or subcutaneous tissue 02/28/2014   Fatigue 02/28/2014   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   Atrial tachycardia 12/11/2012   Protein calorie malnutrition 05/10/2012   Nonspecific (abnormal) findings on radiological and other examination of gastrointestinal tract 04/10/2012   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) 01/13/2012   Melena 12/03/2011   Hypothyroidism 08/22/2011   Crohn's disease (HCC) 06/18/2011   Chest pain 01/29/2010   Hyperlipidemia 01/29/2010   Pain in left foot 01/29/2010   Paroxysmal atrial fibrillation (HCC) 01/29/2010   Cystitis 01/29/2010    PCP: Toribio Shan, MD REFERRING PROVIDER: Olam Simpler, FNP  REFERRING DIAG: R42 (ICD-10-CM) - Dizziness and giddiness   THERAPY DIAG:  Unsteadiness on feet - Plan: PT plan of care cert/re-cert  Dizziness and giddiness - Plan: PT plan of care cert/re-cert  ONSET DATE: 08/31/24  Rationale for Evaluation and Treatment: Rehabilitation  SUBJECTIVE:   SUBJECTIVE STATEMENT: Patient arrives to physical therapy with wife, who remains in lobby. Patient unsure of why he's here initially. With questionning, reports vertigo daily. He states that the world is running around and I'm standing still. Unable to state any inciting activities/movements. Per patient request, PT retrieved patients wife from lobby to supplement hx. She states that it gets better as the day passes. Per wife, this started many months ago- she denies injury, illness or rx changes at that time. Per patient, changes in flooring will make him lose his balance where his body keeps moving, but his feet stay still. Does have macular degeneration. Reports diplopia in L eye with Lward gaze.  Pt accompanied by: significant other  PERTINENT HISTORY: anemia, cataracts, GERD, heart murmur, HLD, skin CA  PAIN:  Are you having pain? No  PRECAUTIONS: Fall   WEIGHT BEARING RESTRICTIONS:  No  FALLS: Has patient fallen in last 6 months? No  LIVING ENVIRONMENT: Lives with: lives with their family Lives in: House/apartment Stairs: Yes: External: 7-8 steps; can reach both Has following equipment at home: Single point cane, Walker - 2 wheeled, Wheelchair (manual), and Grab bars  PLOF: Independent not driving  PATIENT GOALS: make me young again  OBJECTIVE:  Note: Objective measures were completed at Evaluation unless otherwise noted.  DIAGNOSTIC FINDINGS: 03/22/24 brain MRI IMPRESSION: MRI scan of the brain with and without contrast showing moderate degrees of age-related changes of chronic small vessel disease and generalized cerebral atrophy.  There are also incidental changes of chronic paranasal sinusitis.  Overall no significant change compared with previous MRI from 04/22/2022.  05/02/24 carotid US  IMPRESSION: 1. No significant stenosis in either internal carotid artery. 2. There is trace smooth atherosclerotic plaque on the left without evidence of stenosis. 3. Vertebral arteries are patent with normal antegrade flow.  COGNITION: Overall cognitive status: History of cognitive impairments - at baseline   SENSATION:  WFL   POSTURE:  No Significant postural limitations  Cervical ROM:   WFL, pain free  STRENGTH: WFL   BED MOBILITY:  Independent, wife and patient deny dizziness   GAIT: Gait pattern: step through pattern, decreased arm swing- Right, decreased arm swing- Left, decreased stride length, decreased hip/knee flexion- Right, decreased hip/knee flexion- Left, Right foot flat, Left foot flat, shuffling, festinating, decreased trunk rotation, trunk flexed, and narrow BOS Distance walked: clinic Assistive device utilized: None Level of assistance: SBA and CGA   VESTIBULAR ASSESSMENT:  GENERAL OBSERVATION: NAD, no AD, shuffling gait   SYMPTOM BEHAVIOR:  Subjective history: vertical diplopia with Lward gaze   Non-Vestibular symptoms: none  reported  Type of dizziness: Spinning/Vertigo  Frequency: daily  Duration: ~1 min  Aggravating factors: Worse in the morning varies though  Relieving factors: no known relieving factors and medication  Progression of symptoms: unchanged  OCULOMOTOR EXAM:  Ocular Alignment: normal  Ocular ROM: No Limitations  Spontaneous Nystagmus: absent  Gaze-Induced Nystagmus: absent  Smooth Pursuits: saccades vertical  Saccades: hypometric/undershoots to the L   Convergence/Divergence: ~15 cm   Cover-cross-cover test: Normal  VESTIBULAR - OCULAR REFLEX:   Slow VOR: Normal  VOR Cancellation: Corrective Saccades  Head-Impulse Test: HIT Right: positive HIT Left: positive  Dynamic Visual Acuity: TBA   POSITIONAL TESTING: TBA PRN  MOTION SENSITIVITY:  Motion Sensitivity Quotient Intensity: 0 = none, 1 = Lightheaded, 2 = Mild, 3 = Moderate, 4 = Severe, 5 = Vomiting  Intensity  1. Sitting to supine   2. Supine to L side   3. Supine to R side   4. Supine to sitting   5. L Hallpike-Dix   6. Up from L    7. R Hallpike-Dix   8. Up from R    9. Sitting, head tipped to L knee 0  10. Head up from L knee 0  11. Sitting, head tipped to R knee 0  12. Head up from R knee 0  13. Sitting head turns x5 0  14.Sitting head nods x5 0  15. In stance, 180 turn to L    16. In stance, 180 turn to R     Vitals:   09/08/24 0909  BP: (!) 123/52  Pulse: 70                                                                                                                             TREATMENT  Self care/ home management: -ddx, pathophys, might benefit from eye dr f/u   PATIENT EDUCATION: Education details: PT POC, exam findings, see above Person educated: Patient and Spouse Education method: Explanation Education comprehension: verbalized understanding and needs further education  HOME EXERCISE PROGRAM: To be provided  GOALS: Goals reviewed with patient? Yes  SHORT TERM GOALS: = LTG based on PT  POC length   LONG TERM GOALS: Target date: 10/07/24  Pt will be independent with final HEP for improved symptom report  Baseline: to be provided  Goal status: INITIAL  2.  MSQ goal  Baseline: to be completed  Goal status: INITIAL  3.  MCTSIB goal Baseline: to be completed Goal status: INITIAL  4.  Patient will demonstrate a convergence of </=10cm to demonstrate improved binocular functioning Baseline: 15cm Goal status: INITIAL  ASSESSMENT:  CLINICAL IMPRESSION: Patient is a 83 y.o. male who was seen today for physical therapy evaluation and treatment for dizziness. He reports rather non-specific, room-spinning dizziness with unknown provoking factors. He presents with a festinating gait pattern and endorses greatest difficulty with floor surfaces changes in regards to LOB and near-falls. He and his wife both report a progressive decline in his level of independence with ADLs and fine motor coordination. Of note, he reports monocular vertical diplopia with Lward gaze. He is unable to state whether or not this started when his instances of vertigo started. He would benefit from a trial of PT to address the above mentioned deficits.    OBJECTIVE IMPAIRMENTS: Abnormal gait, decreased balance, decreased cognition, decreased coordination, decreased endurance, decreased knowledge of condition, decreased mobility, dizziness, and impaired vision/preception.   ACTIVITY LIMITATIONS: stairs, transfers, bathing, dressing, reach over head, hygiene/grooming, locomotion level, and caring for others  PARTICIPATION LIMITATIONS: meal prep, cleaning, medication management, interpersonal relationship, driving, shopping, community activity, and yard work  PERSONAL FACTORS: Age, Fitness, Past/current experiences, Time since onset of injury/illness/exacerbation, Transportation, and 3+ comorbidities: see above are also affecting patient's functional outcome.   REHAB POTENTIAL: Fair unknown etiology    CLINICAL DECISION MAKING: Unstable/unpredictable  EVALUATION COMPLEXITY: High   PLAN:  PT FREQUENCY: 1-2x/week  PT DURATION: 4 weeks  PLANNED INTERVENTIONS: 02835- PT Re-evaluation, 97750- Physical Performance Testing, 97110-Therapeutic exercises, 97530- Therapeutic activity, [id number]- Neuromuscular re-education, [phone]- Self Care, 02859- Manual therapy, [phone]- Gait training, [phone]- Canalith repositioning, [phone]- Aquatic Therapy, Patient/Family education, Balance training, Stair training, Vestibular training, Visual/preceptual remediation/compensation, Cognitive remediation, and DME instructions  PLAN FOR NEXT SESSION: finish MSQ + goal, MCTSIB + goal, assess rigidity and RAM, convergence, habituation, VOR/VOR cancellation, saccades, smooth pursuits    Delon DELENA Pop, PT Delon DELENA Pop, PT, DPT, CBIS  09/08/2024, 9:56 AM

## 2024-09-15 ENCOUNTER — Ambulatory Visit: Attending: Physical Medicine and Rehabilitation | Admitting: Physical Therapy

## 2024-09-15 DIAGNOSIS — R2681 Unsteadiness on feet: Secondary | ICD-10-CM | POA: Insufficient documentation

## 2024-09-15 DIAGNOSIS — R42 Dizziness and giddiness: Secondary | ICD-10-CM | POA: Insufficient documentation

## 2024-09-15 NOTE — Therapy (Signed)
 OUTPATIENT PHYSICAL THERAPY VESTIBULAR TREATMENT NOTE     Patient Name: JARREAU CALLANAN MRN: [id number] DOB:1941-08-05, 83 y.o., male Today's Date: 09/18/2024  END OF SESSION:  PT End of Session - 09/18/24 1401     Visit Number 2    Number of Visits 9    Date for Recertification  10/07/24    Authorization Type HTA    PT Start Time 0801    PT Stop Time 0845    PT Time Calculation (min) 44 min    Activity Tolerance Patient tolerated treatment well    Behavior During Therapy [facility] for tasks assessed/performed;Flat affect           Past Medical History:  Diagnosis Date   Anemia    PO IRON    Aortic stenosis, mild    Cataract    Colonic hemorrhage 11/10/1962   Crohn's disease (HCC)    GERD (gastroesophageal reflux disease)    Heart murmur    Hyperlipidemia    Macular degeneration    PAC (premature atrial contraction)    Palpitations    Sepsis (HCC) 02/23/2015   SIRS (systemic inflammatory response syndrome) (HCC) 02/23/2015   Sleep apnea, obstructive 08/28/2020   on CPAP   Squamous cell skin cancer    Past Surgical History:  Procedure Laterality Date   APPENDECTOMY     Biopsy Facial  05/10/2019   BLADDER REPAIR  1964   intestine leaks/bladder repair   CATARACT EXTRACTION W/ INTRAOCULAR LENS  IMPLANT, BILATERAL     2013   ILEOSTOMY  04/16/2012   Procedure: ILEOSTOMY;  Surgeon: Elon CHRISTELLA Pacini, MD;  Location: [facility];  Service: General;  Laterality: N/A;   ILEOSTOMY CLOSURE  12/07/2012   ILEOSTOMY CLOSURE  12/07/2012   Procedure: ILEOSTOMY TAKEDOWN;  Surgeon: Elon CHRISTELLA Pacini, MD;  Location: [facility] OR;  Service: General;  Laterality: N/A;   LAPAROTOMY  04/16/2012   Procedure: EXPLORATORY LAPAROTOMY;  Surgeon: Elon CHRISTELLA Pacini, MD;  Location: [facility] OR;  Service: General;  Laterality: N/A;   LYSIS OF ADHESION  12/07/2012   Procedure: LYSIS OF ADHESION;  Surgeon: Elon CHRISTELLA Pacini, MD;  Location: [facility] OR;  Service: General;  Laterality: N/A;   PROCTOSCOPY  12/07/2012    Procedure: PROCTOSCOPY;  Surgeon: Elon CHRISTELLA Pacini, MD;  Location: [facility] OR;  Service: General;;  ILEO PROCTOSCOPY   SKIN CANCER EXCISION     STOMACH SURGERY  1959   ulcer   TONSILLECTOMY     Patient Active Problem List   Diagnosis Date Noted   Dizziness 04/26/2024   Ear itch 04/26/2024   Abnormal weight loss 03/16/2024   Chronic kidney disease, stage 3a (HCC) 03/16/2024   Daytime somnolence 03/16/2024   Herpes zoster without complication 03/16/2024   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, with stage 1 through stage 4 chronic kidney disease, or unspecified chronic kidney disease 03/16/2024   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   Memory loss 03/16/2024   Obstructive sleep apnea 03/16/2024   Other long term (current) drug therapy 03/16/2024   PAD (peripheral artery disease) 03/16/2024   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   Common bile duct dilation 06/13/2021   Essential hypertension 04/16/2020   Paronychia of left index finger 02/03/2020   Cellulitis of finger of left hand 01/11/2020   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   Insomnia 10/03/2019  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   Encounter for screening for other disorder 09/21/2018   Pain in right knee 09/21/2018   Right knee pain 09/21/2018   Encounter for general adult medical examination without abnormal findings 09/14/2018   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   Ganglion cyst of dorsum of left wrist 07/14/2018   Ganglion of right wrist 07/01/2018   Dementia (HCC) 09/14/2017   Benign paroxysmal positional vertigo 05/28/2017   Bilateral hearing loss 05/28/2017   Disequilibrium 07/16/2016   Hemorrhage of rectum and anus 04/21/2016   Sepsis (HCC) 02/28/2015   Impaired fasting glucose 09/04/2014   Vitamin D deficiency 09/04/2014    Impacted cerumen of right ear 05/30/2014   Disorder of skin or subcutaneous tissue 02/28/2014   Fatigue 02/28/2014   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   Atrial tachycardia 12/11/2012   Protein calorie malnutrition 05/10/2012   Nonspecific (abnormal) findings on radiological and other examination of gastrointestinal tract 04/10/2012   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) 01/13/2012   Melena 12/03/2011   Hypothyroidism 08/22/2011   Crohn's disease (HCC) 06/18/2011   Chest pain 01/29/2010   Hyperlipidemia 01/29/2010   Pain in left foot 01/29/2010   Paroxysmal atrial fibrillation (HCC) 01/29/2010   Cystitis 01/29/2010    PCP: Toribio Shan, MD REFERRING PROVIDER: Olam Simpler, FNP  REFERRING DIAG: R42 (ICD-10-CM) - Dizziness and giddiness   THERAPY DIAG:  Unsteadiness on feet  Dizziness and giddiness  ONSET DATE: 08/31/24  Rationale for Evaluation and Treatment: Rehabilitation  SUBJECTIVE:   SUBJECTIVE STATEMENT: Patient reports no changes since initial eval;  pt reports no falls but says he has come close.  Pt reports sometimes the different surfaces outside cause a problem for him and makes him become unsteady (could be visual); pt reports he has difficulty standing still - is better if he keeps moving.  Pt denies dizziness at today's session.  Pt accompanied by:  wife  PERTINENT HISTORY: anemia, cataracts, GERD, heart murmur, HLD, skin CA  PAIN:  Are you having pain? No  PRECAUTIONS: Fall   WEIGHT BEARING RESTRICTIONS: No  FALLS: Has patient fallen in last 6 months? No  LIVING ENVIRONMENT: Lives with: lives with their family Lives in: House/apartment Stairs: Yes: External: 7-8 steps; can reach both Has following equipment at home: Single point cane, Walker - 2 wheeled, Wheelchair (manual), and Grab bars  PLOF: Independent not driving  PATIENT GOALS: make me young again;    OBJECTIVE:  Note: Objective measures were  completed at Evaluation unless otherwise noted.  DIAGNOSTIC FINDINGS: 03/22/24 brain MRI IMPRESSION: MRI scan of the brain with and without contrast showing moderate degrees of age-related changes of chronic small vessel disease and generalized cerebral atrophy.  There are also incidental changes of chronic paranasal sinusitis.  Overall no significant change compared with previous MRI from 04/22/2022.  05/02/24 carotid US  IMPRESSION: 1. No significant stenosis in either internal carotid artery. 2. There is trace smooth atherosclerotic plaque on the left without evidence of stenosis. 3. Vertebral arteries are patent with normal antegrade flow.  COGNITION: Overall cognitive status: History of cognitive impairments - at baseline   SENSATION: WFL   POSTURE:  No Significant postural limitations  Cervical ROM:   WFL, pain free  STRENGTH: WFL   BED MOBILITY:  Independent, wife and patient deny dizziness   GAIT: Gait pattern: step through pattern, decreased arm swing- Right, decreased arm swing- Left, decreased stride length, decreased hip/knee flexion- Right, decreased hip/knee flexion- Left, Right foot flat, Left foot flat, shuffling,  festinating, decreased trunk rotation, trunk flexed, and narrow BOS Distance walked: clinic Assistive device utilized: None Level of assistance: SBA and CGA   VESTIBULAR ASSESSMENT:  GENERAL OBSERVATION: NAD, no AD, shuffling gait   SYMPTOM BEHAVIOR:  Subjective history: vertical diplopia with Lward gaze   Non-Vestibular symptoms: none reported  Type of dizziness: Spinning/Vertigo  Frequency: daily  Duration: ~1 min  Aggravating factors: Worse in the morning varies though  Relieving factors: no known relieving factors and medication  Progression of symptoms: unchanged  OCULOMOTOR EXAM:  Ocular Alignment: normal  Ocular ROM: No Limitations  Spontaneous Nystagmus: absent  Gaze-Induced Nystagmus: absent  Smooth Pursuits: saccades  vertical  Saccades: hypometric/undershoots to the L   Convergence/Divergence: ~15 cm   Cover-cross-cover test: Normal  VESTIBULAR - OCULAR REFLEX:   Slow VOR: Normal  VOR Cancellation: Corrective Saccades  Head-Impulse Test: HIT Right: positive HIT Left: positive  Dynamic Visual Acuity: TBA   POSITIONAL TESTING: TBA PRN  MOTION SENSITIVITY:  Motion Sensitivity Quotient Intensity: 0 = none, 1 = Lightheaded, 2 = Mild, 3 = Moderate, 4 = Severe, 5 = Vomiting  Intensity  1. Sitting to supine 0  2. Supine to L side 0  3. Supine to R side 0  4. Supine to sitting 1  5. L Hallpike-Dix N/A  6. Up from L  N/A  7. R Hallpike-Dix N/A  8. Up from R  N/A  9. Sitting, head tipped to L knee 0  10. Head up from L knee 0  11. Sitting, head tipped to R knee 0  12. Head up from R knee 0  13. Sitting head turns x5 0  14.Sitting head nods x5 0  15. In stance, 180 turn to L  0  16. In stance, 180 turn to R 0 = reported seeing the pillows moving when turning from Rt side back to neutral     There were no vitals filed for this visit.                                                                                                                            TREATMENT   09-15-24  Self care:   discussed pt's symptoms with pt reporting more unsteadiness and difficulty negotiating transitional surfaces rather than dizziness Instructed in HEP - see below  TherAct:   Finished MSQ - see above; pt reported no dizziness with any movement, only light-headedness with supine to sitting  NeuroRe-ed: mCTSIb =   Condition 1 = 30 secs Condition 2 = 6 secs Condition 3 = 30 secs - mild postural sway  Condition 4 - 4.65 secs with moderate postural sway   Convergence - approx. 14 (diplopia in Lt eye with downward gaze contributing to impaired convergence)   Gait velocity = 14.78, 13.94 secs =  TUG = 14.8 secs   HEP : Access Code: [email or web address] URL: [email or web address] Date:  09/18/2024 Prepared by: Rock Kussmaul  Exercises - Standing Marching  - 1  x daily - 7 x weekly - 1 sets - 10 reps - Single Leg Stance with Support  - 1 x daily - 7 x weekly - 1 sets - 1-2 reps - 10 sec hold - Standing Balance with Eyes Open  - 1 x daily - 7 x weekly - 1 sets - 10 reps - Standing Balance with Eyes Closed  - 1 x daily - 7 x weekly - 1 sets - 10 reps   PATIENT EDUCATION: Education details:  Medbridge  Person educated: Patient and Spouse Education method: Explanation Education comprehension: verbalized understanding and needs further education  HOME EXERCISE PROGRAM: To be provided  GOALS: Goals reviewed with patient? Yes  SHORT TERM GOALS: = LTG based on PT POC length   LONG TERM GOALS: Target date: 10/07/24  Pt will be independent with final HEP for improved symptom report  Baseline: to be provided  Goal status: INITIAL  2.  MSQ goal; stand with EC on floor (condition 2) for 15 secs to demo improved vestibular input in maintaining balance Baseline: to be completed  Goal status: N/A - no dizziness reported 09-15-24  3.  MCTSIB goal:  stand with EC on floor (condition 2) for 15 secs to demo improved vestibular input in maintaining balance Baseline: to be completed; 6 secs on 09-15-24 Goal status: INITIAL  4.  Patient will demonstrate a convergence of </=10cm to demonstrate improved binocular functioning Baseline: 15cm Goal status: INITIAL  ASSESSMENT:  CLINICAL IMPRESSION: PT session focused on completion of vestibular assessment including MSQ and mCTSIB.  Pt reported no dizziness with any movements in MSQ with exception of supine to sit, in which her reported light-headedness with this transfer.  Pt had difficulty maintaining balance with EC on floor and also on compliant surface, with 6 secs and 4.65 secs respectively.  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  He also demonstrates postural changes characteristic of Parkinson's including forward flexed posture with decreased arm swing and decreased step length with decreased dorsiflexion.  Cont with POC.   OBJECTIVE IMPAIRMENTS: Abnormal gait, decreased balance, decreased cognition, decreased coordination, decreased endurance, decreased knowledge of condition, decreased mobility, dizziness, and impaired vision/preception.   ACTIVITY LIMITATIONS: stairs, transfers, bathing, dressing, reach over head, hygiene/grooming, locomotion level, and caring for others  PARTICIPATION LIMITATIONS: meal prep, cleaning, medication management, interpersonal relationship, driving, shopping, community activity, and yard work  PERSONAL FACTORS: Age, Fitness, Past/current experiences, Time since onset of injury/illness/exacerbation, Transportation, and 3+ comorbidities: see above are also affecting patient's functional outcome.   REHAB POTENTIAL: Fair unknown etiology   CLINICAL DECISION MAKING: Unstable/unpredictable  EVALUATION COMPLEXITY: High   PLAN:  PT FREQUENCY: 1-2x/week  PT DURATION: 4 weeks  PLANNED INTERVENTIONS: 97164- PT Re-evaluation, 97750- Physical Performance Testing, 97110-Therapeutic exercises, 97530- Therapeutic activity, [id number]- Neuromuscular re-education, 97535- Self Care, [id number]- Manual therapy, [phone]- Gait training, [phone]- Canalith repositioning, [phone]- Aquatic Therapy, Patient/Family education, Balance training, Stair training, Vestibular training, Visual/preceptual remediation/compensation, Cognitive remediation, and DME instructions  PLAN FOR NEXT SESSION: balance and gait - different surfaces; how is HEP going? (Standing on blanket?)   Kohei Antonellis, [place], PT   09/18/2024, 2:03 PM

## 2024-09-18 ENCOUNTER — Encounter: Payer: Self-pay | Admitting: Physical Therapy

## 2024-09-22 ENCOUNTER — Ambulatory Visit: Admitting: Physical Therapy

## 2024-09-22 DIAGNOSIS — R2681 Unsteadiness on feet: Secondary | ICD-10-CM

## 2024-09-22 DIAGNOSIS — R42 Dizziness and giddiness: Secondary | ICD-10-CM

## 2024-09-22 NOTE — Therapy (Signed)
 OUTPATIENT PHYSICAL THERAPY VESTIBULAR TREATMENT NOTE     Patient Name: Nicholas [name] MRN: [id number] DOB:1941/08/28, 83 y.o., male Today's Date: 09/25/2024  END OF SESSION:  PT End of Session - 09/25/24 1622     Visit Number 3    Number of Visits 9    Date for Recertification  10/07/24    Authorization Type HTA    PT Start Time 0931    PT Stop Time 1015    PT Time Calculation (min) 44 min    Equipment Utilized During Treatment Gait belt    Activity Tolerance Patient tolerated treatment well    Behavior During Therapy WFL for tasks assessed/performed            Past Medical History:  Diagnosis Date   Anemia    PO IRON    Aortic stenosis, mild    Cataract    Colonic hemorrhage 11/10/1962   Crohn's disease (HCC)    GERD (gastroesophageal reflux disease)    Heart murmur    Hyperlipidemia    Macular degeneration    PAC (premature atrial contraction)    Palpitations    Sepsis (HCC) 02/23/2015   SIRS (systemic inflammatory response syndrome) (HCC) 02/23/2015   Sleep apnea, obstructive 08/28/2020   on CPAP   Squamous cell skin cancer    Past Surgical History:  Procedure Laterality Date   APPENDECTOMY     Biopsy Facial  05/10/2019   BLADDER REPAIR  1964   intestine leaks/bladder repair   CATARACT EXTRACTION W/ INTRAOCULAR LENS  IMPLANT, BILATERAL     2013   ILEOSTOMY  04/16/2012   Procedure: ILEOSTOMY;  Surgeon: Elon CHRISTELLA Pacini, MD;  Location: [facility] OR;  Service: General;  Laterality: N/A;   ILEOSTOMY CLOSURE  12/07/2012   ILEOSTOMY CLOSURE  12/07/2012   Procedure: ILEOSTOMY TAKEDOWN;  Surgeon: Elon CHRISTELLA Pacini, MD;  Location: [facility] OR;  Service: General;  Laterality: N/A;   LAPAROTOMY  04/16/2012   Procedure: EXPLORATORY LAPAROTOMY;  Surgeon: Elon CHRISTELLA Pacini, MD;  Location: [facility] OR;  Service: General;  Laterality: N/A;   LYSIS OF ADHESION  12/07/2012   Procedure: LYSIS OF ADHESION;  Surgeon: Elon CHRISTELLA Pacini, MD;  Location: [facility] OR;  Service: General;  Laterality:  N/A;   PROCTOSCOPY  12/07/2012   Procedure: PROCTOSCOPY;  Surgeon: Elon CHRISTELLA Pacini, MD;  Location: [facility] OR;  Service: General;;  ILEO PROCTOSCOPY   SKIN CANCER EXCISION     STOMACH SURGERY  1959   ulcer   TONSILLECTOMY     Patient Active Problem List   Diagnosis Date Noted   Dizziness 04/26/2024   Ear itch 04/26/2024   Abnormal weight loss 03/16/2024   Chronic kidney disease, stage 3a (HCC) 03/16/2024   Daytime somnolence 03/16/2024   Herpes zoster without complication 03/16/2024   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, with stage 1 through stage 4 chronic kidney disease, or unspecified chronic kidney disease 03/16/2024   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   Memory loss 03/16/2024   Obstructive sleep apnea 03/16/2024   Other long term (current) drug therapy 03/16/2024   PAD (peripheral artery disease) 03/16/2024   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   Common bile duct dilation 06/13/2021   Essential hypertension 04/16/2020   Paronychia of left index finger 02/03/2020   Cellulitis of finger of left hand 01/11/2020   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   Insomnia 10/03/2019   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   Encounter for screening for other disorder 09/21/2018   Pain in right knee 09/21/2018   Right knee pain 09/21/2018   Encounter for general adult medical examination without abnormal findings 09/14/2018   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   Ganglion cyst of dorsum of left wrist 07/14/2018   Ganglion of right wrist 07/01/2018   Dementia (HCC) 09/14/2017   Benign paroxysmal positional vertigo 05/28/2017   Bilateral hearing loss 05/28/2017   Disequilibrium 07/16/2016   Hemorrhage of rectum and anus 04/21/2016   Sepsis (HCC) 02/28/2015   Impaired fasting glucose 09/04/2014    Vitamin D deficiency 09/04/2014   Impacted cerumen of right ear 05/30/2014   Disorder of skin or subcutaneous tissue 02/28/2014   Fatigue 02/28/2014   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   Atrial tachycardia 12/11/2012   Protein calorie malnutrition 05/10/2012   Nonspecific (abnormal) findings on radiological and other examination of gastrointestinal tract 04/10/2012   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) 01/13/2012   Melena 12/03/2011   Hypothyroidism 08/22/2011   Crohn's disease (HCC) 06/18/2011   Chest pain 01/29/2010   Hyperlipidemia 01/29/2010   Pain in left foot 01/29/2010   Paroxysmal atrial fibrillation (HCC) 01/29/2010   Cystitis 01/29/2010    PCP: Toribio Shan, MD REFERRING PROVIDER: Olam Simpler, FNP  REFERRING DIAG: R42 (ICD-10-CM) - Dizziness and giddiness   THERAPY DIAG:  Unsteadiness on feet  Dizziness and giddiness  ONSET DATE: 08/31/24  Rationale for Evaluation and Treatment: Rehabilitation  SUBJECTIVE:   SUBJECTIVE STATEMENT: Wife reports yesterday was a good day - wife says today hasn't been as good; says no vertigo in past week - I have about a 2 day memory; has not tried standing on a blanket this week Pt accompanied by:  wife  PERTINENT HISTORY: anemia, cataracts, GERD, heart murmur, HLD, skin CA  PAIN:  Are you having pain? No  PRECAUTIONS: Fall   WEIGHT BEARING RESTRICTIONS: No  FALLS: Has patient fallen in last 6 months? No  LIVING ENVIRONMENT: Lives with: lives with their family Lives in: House/apartment Stairs: Yes: External: 7-8 steps; can reach both Has following equipment at home: Single point cane, Walker - 2 wheeled, Wheelchair (manual), and Grab bars  PLOF: Independent not driving  PATIENT GOALS: make me young again;    OBJECTIVE:  Note: Objective measures were completed at Evaluation unless otherwise noted.  DIAGNOSTIC FINDINGS: 03/22/24 brain MRI IMPRESSION: MRI scan of the brain with  and without contrast showing moderate degrees of age-related changes of chronic small vessel disease and generalized cerebral atrophy.  There are also incidental changes of chronic paranasal sinusitis.  Overall no significant change compared with previous MRI from 04/22/2022.  05/02/24 carotid US  IMPRESSION: 1. No significant stenosis in either internal carotid artery. 2. There is trace smooth atherosclerotic plaque on the left without evidence of stenosis. 3. Vertebral arteries are patent with normal antegrade flow.  COGNITION: Overall cognitive status: History of cognitive impairments - at baseline   SENSATION: WFL   POSTURE:  No Significant postural limitations  Cervical ROM:   WFL, pain free  STRENGTH: WFL   BED MOBILITY:  Independent, wife and patient deny dizziness   GAIT: Gait pattern: step through pattern, decreased arm swing- Right, decreased arm swing- Left, decreased stride length, decreased hip/knee flexion- Right, decreased hip/knee flexion- Left, Right foot flat, Left foot flat, shuffling, festinating, decreased trunk rotation, trunk flexed, and narrow BOS Distance walked: clinic  Assistive device utilized: None Level of assistance: SBA and CGA   VESTIBULAR ASSESSMENT:  GENERAL OBSERVATION: NAD, no AD, shuffling gait   SYMPTOM BEHAVIOR:  Subjective history: vertical diplopia with Lward gaze   Non-Vestibular symptoms: none reported  Type of dizziness: Spinning/Vertigo  Frequency: daily  Duration: ~1 min  Aggravating factors: Worse in the morning varies though  Relieving factors: no known relieving factors and medication  Progression of symptoms: unchanged  OCULOMOTOR EXAM:  Ocular Alignment: normal  Ocular ROM: No Limitations  Spontaneous Nystagmus: absent  Gaze-Induced Nystagmus: absent  Smooth Pursuits: saccades vertical  Saccades: hypometric/undershoots to the L   Convergence/Divergence: ~15 cm   Cover-cross-cover test: Normal  VESTIBULAR -  OCULAR REFLEX:   Slow VOR: Normal  VOR Cancellation: Corrective Saccades  Head-Impulse Test: HIT Right: positive HIT Left: positive  Dynamic Visual Acuity: TBA   POSITIONAL TESTING: TBA PRN  MOTION SENSITIVITY:  Motion Sensitivity Quotient Intensity: 0 = none, 1 = Lightheaded, 2 = Mild, 3 = Moderate, 4 = Severe, 5 = Vomiting  Intensity  1. Sitting to supine 0  2. Supine to L side 0  3. Supine to R side 0  4. Supine to sitting 1  5. L Hallpike-Dix N/A  6. Up from L  N/A  7. R Hallpike-Dix N/A  8. Up from R  N/A  9. Sitting, head tipped to L knee 0  10. Head up from L knee 0  11. Sitting, head tipped to R knee 0  12. Head up from R knee 0  13. Sitting head turns x5 0  14.Sitting head nods x5 0  15. In stance, 180 turn to L  0  16. In stance, 180 turn to R 0 = reported seeing the pillows moving when turning from Rt side back to neutral     There were no vitals filed for this visit.                                                                                                                            TREATMENT   09-22-24  Gait: Pt amb. 115' around track without device with cues to increase step length - no LOB occurred  TherAct:   4 square stepping - pt performed 3 reps with CGA to min assist   Pt performed marching on incline - 10 reps with EO with CGA to min assist Alternate stepping forward/back on incline with CGA  Pt performed touching balance bubbles (3) with CGA to min assist - 5 reps each LE  NeuroRe-ed: Pt stood on Airex - 30 secs with EO:  30 secs with EC   Sit to stand feet on floor 5 reps with minimal UE support   Pt performed standing balance exercises - hip flexion, extension and abduction alternating LE's - 10 reps each with UE support at counter   HEP : Access Code: [email or web address] URL: [email or web address] Date: 09/18/2024 Prepared by: Rock Kussmaul  Exercises - Standing Marching  -  1 x daily - 7 x weekly - 1 sets - 10 reps -  Single Leg Stance with Support  - 1 x daily - 7 x weekly - 1 sets - 1-2 reps - 10 sec hold - Standing Balance with Eyes Open  - 1 x daily - 7 x weekly - 1 sets - 10 reps - Standing Balance with Eyes Closed  - 1 x daily - 7 x weekly - 1 sets - 10 reps   PATIENT EDUCATION: Education details:  Medbridge  Person educated: Patient and Spouse Education method: Explanation Education comprehension: verbalized understanding and needs further education  HOME EXERCISE PROGRAM: To be provided  GOALS: Goals reviewed with patient? Yes  SHORT TERM GOALS: = LTG based on PT POC length   LONG TERM GOALS: Target date: 10/07/24  Pt will be independent with final HEP for improved symptom report  Baseline: to be provided  Goal status: INITIAL  2.  MSQ goal; stand with EC on floor (condition 2) for 15 secs to demo improved vestibular input in maintaining balance Baseline: to be completed  Goal status: N/A - no dizziness reported 09-15-24  3.  MCTSIB goal:  stand with EC on floor (condition 2) for 15 secs to demo improved vestibular input in maintaining balance Baseline: to be completed; 6 secs on 09-15-24 Goal status: INITIAL  4.  Patient will demonstrate a convergence of </=10cm to demonstrate improved binocular functioning Baseline: 15cm Goal status: INITIAL  ASSESSMENT:  CLINICAL IMPRESSION: PT session focused on balance exercises and gait training with cues to increase step length.  Pt demonstrated improved balance in today's session compared to that in previous session with pt able to stand on compliant surface (Airex) for 30 secs with EC in today's session.  Pt's gait pattern improved with increased step length noted.  Cont with POC.   OBJECTIVE IMPAIRMENTS: Abnormal gait, decreased balance, decreased cognition, decreased coordination, decreased endurance, decreased knowledge of condition, decreased mobility, dizziness, and impaired vision/preception.   ACTIVITY LIMITATIONS: stairs,  transfers, bathing, dressing, reach over head, hygiene/grooming, locomotion level, and caring for others  PARTICIPATION LIMITATIONS: meal prep, cleaning, medication management, interpersonal relationship, driving, shopping, community activity, and yard work  PERSONAL FACTORS: Age, Fitness, Past/current experiences, Time since onset of injury/illness/exacerbation, Transportation, and 3+ comorbidities: see above are also affecting patient's functional outcome.   REHAB POTENTIAL: Fair unknown etiology   CLINICAL DECISION MAKING: Unstable/unpredictable  EVALUATION COMPLEXITY: High   PLAN:  PT FREQUENCY: 1-2x/week  PT DURATION: 4 weeks  PLANNED INTERVENTIONS: 97164- PT Re-evaluation, 97750- Physical Performance Testing, 97110-Therapeutic exercises, 97530- Therapeutic activity, [id number]- Neuromuscular re-education, 97535- Self Care, [id number]- Manual therapy, [phone]- Gait training, [phone]- Canalith repositioning, [phone]- Aquatic Therapy, Patient/Family education, Balance training, Stair training, Vestibular training, Visual/preceptual remediation/compensation, Cognitive remediation, and DME instructions  PLAN FOR NEXT SESSION: balance and gait - different surfaces; how is HEP going? (Standing on blanket?)   Charles Andringa, [place], PT   09/25/2024, 4:24 PM

## 2024-09-25 ENCOUNTER — Encounter: Payer: Self-pay | Admitting: Physical Therapy

## 2024-09-29 ENCOUNTER — Ambulatory Visit: Admitting: Physical Therapy

## 2024-09-29 ENCOUNTER — Encounter: Payer: Self-pay | Admitting: Physical Therapy

## 2024-09-29 DIAGNOSIS — R2681 Unsteadiness on feet: Secondary | ICD-10-CM | POA: Diagnosis not present

## 2024-09-29 DIAGNOSIS — R42 Dizziness and giddiness: Secondary | ICD-10-CM

## 2024-09-29 NOTE — Therapy (Signed)
 OUTPATIENT PHYSICAL THERAPY VESTIBULAR TREATMENT NOTE     Patient Name: Nicholas [name] MRN: [id number] DOB:1941/07/20, 83 y.o., male Today's Date: 09/29/2024  END OF SESSION:  PT End of Session - 09/29/24 1914     Visit Number 4    Number of Visits 9    Date for Recertification  11/04/24    Authorization Type HTA    Authorization Time Period 09-29-24 - 11-04-24    PT Start Time 0932    PT Stop Time 1016    PT Time Calculation (min) 44 min    Equipment Utilized During Treatment Gait belt    Activity Tolerance Patient tolerated treatment well    Behavior During Therapy [facility] for tasks assessed/performed             Past Medical History:  Diagnosis Date   Anemia    PO IRON    Aortic stenosis, mild    Cataract    Colonic hemorrhage 11/10/1962   Crohn's disease (HCC)    GERD (gastroesophageal reflux disease)    Heart murmur    Hyperlipidemia    Macular degeneration    PAC (premature atrial contraction)    Palpitations    Sepsis (HCC) 02/23/2015   SIRS (systemic inflammatory response syndrome) (HCC) 02/23/2015   Sleep apnea, obstructive 08/28/2020   on CPAP   Squamous cell skin cancer    Past Surgical History:  Procedure Laterality Date   APPENDECTOMY     Biopsy Facial  05/10/2019   BLADDER REPAIR  1964   intestine leaks/bladder repair   CATARACT EXTRACTION W/ INTRAOCULAR LENS  IMPLANT, BILATERAL     2013   ILEOSTOMY  04/16/2012   Procedure: ILEOSTOMY;  Surgeon: Elon CHRISTELLA Pacini, MD;  Location: [facility] OR;  Service: General;  Laterality: N/A;   ILEOSTOMY CLOSURE  12/07/2012   ILEOSTOMY CLOSURE  12/07/2012   Procedure: ILEOSTOMY TAKEDOWN;  Surgeon: Elon CHRISTELLA Pacini, MD;  Location: [facility] OR;  Service: General;  Laterality: N/A;   LAPAROTOMY  04/16/2012   Procedure: EXPLORATORY LAPAROTOMY;  Surgeon: Elon CHRISTELLA Pacini, MD;  Location: [facility] OR;  Service: General;  Laterality: N/A;   LYSIS OF ADHESION  12/07/2012   Procedure: LYSIS OF ADHESION;  Surgeon: Elon CHRISTELLA Pacini,  MD;  Location: [facility] OR;  Service: General;  Laterality: N/A;   PROCTOSCOPY  12/07/2012   Procedure: PROCTOSCOPY;  Surgeon: Elon CHRISTELLA Pacini, MD;  Location: [facility] OR;  Service: General;;  ILEO PROCTOSCOPY   SKIN CANCER EXCISION     STOMACH SURGERY  1959   ulcer   TONSILLECTOMY     Patient Active Problem List   Diagnosis Date Noted   Dizziness 04/26/2024   Ear itch 04/26/2024   Abnormal weight loss 03/16/2024   Chronic kidney disease, stage 3a (HCC) 03/16/2024   Daytime somnolence 03/16/2024   Herpes zoster without complication 03/16/2024   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, with stage 1 through stage 4 chronic kidney disease, or unspecified chronic kidney disease 03/16/2024   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   Memory loss 03/16/2024   Obstructive sleep apnea 03/16/2024   Other long term (current) drug therapy 03/16/2024   PAD (peripheral artery disease) 03/16/2024   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   Common bile duct dilation 06/13/2021   Essential hypertension 04/16/2020   Paronychia of left index finger 02/03/2020   Cellulitis of finger of left hand 01/11/2020  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   Insomnia 10/03/2019   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   Encounter for screening for other disorder 09/21/2018   Pain in right knee 09/21/2018   Right knee pain 09/21/2018   Encounter for general adult medical examination without abnormal findings 09/14/2018   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   Ganglion cyst of dorsum of left wrist 07/14/2018   Ganglion of right wrist 07/01/2018   Dementia (HCC) 09/14/2017   Benign paroxysmal positional vertigo 05/28/2017   Bilateral hearing loss 05/28/2017   Disequilibrium 07/16/2016   Hemorrhage of rectum and anus 04/21/2016   Sepsis (HCC)  02/28/2015   Impaired fasting glucose 09/04/2014   Vitamin D deficiency 09/04/2014   Impacted cerumen of right ear 05/30/2014   Disorder of skin or subcutaneous tissue 02/28/2014   Fatigue 02/28/2014   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   Atrial tachycardia 12/11/2012   Protein calorie malnutrition 05/10/2012   Nonspecific (abnormal) findings on radiological and other examination of gastrointestinal tract 04/10/2012   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) 01/13/2012   Melena 12/03/2011   Hypothyroidism 08/22/2011   Crohn's disease (HCC) 06/18/2011   Chest pain 01/29/2010   Hyperlipidemia 01/29/2010   Pain in left foot 01/29/2010   Paroxysmal atrial fibrillation (HCC) 01/29/2010   Cystitis 01/29/2010    PCP: Toribio Shan, MD REFERRING PROVIDER: Olam Simpler, FNP  REFERRING DIAG: R42 (ICD-10-CM) - Dizziness and giddiness   THERAPY DIAG:  Unsteadiness on feet - Plan: PT plan of care cert/re-cert  Dizziness and giddiness  ONSET DATE: 08/31/24  Rationale for Evaluation and Treatment: Rehabilitation  SUBJECTIVE:   SUBJECTIVE STATEMENT: Pt reports he feels that he is improving; had some dizziness when he stood up from his recliner but did some exercises and it went away.  Pt reports he has not done any exercises standing on the blanket at home yet but has the blanket near the corner so it is ready when he feels able to do so.  Pt reports no dizziness at this time.   Pt accompanied by:  wife  PERTINENT HISTORY: anemia, cataracts, GERD, heart murmur, HLD, skin CA  PAIN:  Are you having pain? No  PRECAUTIONS: Fall   WEIGHT BEARING RESTRICTIONS: No  FALLS: Has patient fallen in last 6 months? No  LIVING ENVIRONMENT: Lives with: lives with their family Lives in: House/apartment Stairs: Yes: External: 7-8 steps; can reach both Has following equipment at home: Single point cane, Walker - 2 wheeled, Wheelchair (manual), and Grab bars  PLOF:  Independent not driving  PATIENT GOALS: make me young again;    OBJECTIVE:  Note: Objective measures were completed at Evaluation unless otherwise noted.  DIAGNOSTIC FINDINGS: 03/22/24 brain MRI IMPRESSION: MRI scan of the brain with and without contrast showing moderate degrees of age-related changes of chronic small vessel disease and generalized cerebral atrophy.  There are also incidental changes of chronic paranasal sinusitis.  Overall no significant change compared with previous MRI from 04/22/2022.  05/02/24 carotid US  IMPRESSION: 1. No significant stenosis in either internal carotid artery. 2. There is trace smooth atherosclerotic plaque on the left without evidence of stenosis. 3. Vertebral arteries are patent with normal antegrade flow.  COGNITION: Overall cognitive status: History of cognitive impairments - at baseline   SENSATION: WFL   POSTURE:  No Significant postural limitations  Cervical ROM:   WFL, pain free  STRENGTH: WFL   BED MOBILITY:  Independent, wife and patient deny  dizziness   GAIT: Gait pattern: step through pattern, decreased arm swing- Right, decreased arm swing- Left, decreased stride length, decreased hip/knee flexion- Right, decreased hip/knee flexion- Left, Right foot flat, Left foot flat, shuffling, festinating, decreased trunk rotation, trunk flexed, and narrow BOS Distance walked: clinic Assistive device utilized: None Level of assistance: SBA and CGA   VESTIBULAR ASSESSMENT:  GENERAL OBSERVATION: NAD, no AD, shuffling gait   SYMPTOM BEHAVIOR:  Subjective history: vertical diplopia with Lward gaze   Non-Vestibular symptoms: none reported  Type of dizziness: Spinning/Vertigo  Frequency: daily  Duration: ~1 min  Aggravating factors: Worse in the morning varies though  Relieving factors: no known relieving factors and medication  Progression of symptoms: unchanged  OCULOMOTOR EXAM:  Ocular Alignment: normal  Ocular ROM: No  Limitations  Spontaneous Nystagmus: absent  Gaze-Induced Nystagmus: absent  Smooth Pursuits: saccades vertical  Saccades: hypometric/undershoots to the L   Convergence/Divergence: ~15 cm   Cover-cross-cover test: Normal  VESTIBULAR - OCULAR REFLEX:   Slow VOR: Normal  VOR Cancellation: Corrective Saccades  Head-Impulse Test: HIT Right: positive HIT Left: positive  Dynamic Visual Acuity: TBA   POSITIONAL TESTING: TBA PRN  MOTION SENSITIVITY:  Motion Sensitivity Quotient Intensity: 0 = none, 1 = Lightheaded, 2 = Mild, 3 = Moderate, 4 = Severe, 5 = Vomiting  Intensity  1. Sitting to supine 0  2. Supine to L side 0  3. Supine to R side 0  4. Supine to sitting 1  5. L Hallpike[place]Dix N/A  6. Up from L  N/A  7. R Hallpike-Dix N/A  8. Up from R  N/A  9. Sitting, head tipped to L knee 0  10. Head up from L knee 0  11. Sitting, head tipped to R knee 0  12. Head up from R knee 0  13. Sitting head turns x5 0  14.Sitting head nods x5 0  15. In stance, 180 turn to L  0  16. In stance, 180 turn to R 0 = reported seeing the pillows moving when turning from Rt side back to neutral     There were no vitals filed for this visit.                                                                                                                            TREATMENT   09-29-24  Gait: Pt amb. 115' around track without device with cues to increase step length - no LOB occurred; pt noted to have increased Lt arm swing compared to Rt arm swing  Step training; pt ascended steps using step over step without hand rail with SBA:  descended steps using step over step sequence with intermittent UE support on hand rail with SBA to CGA  TherAct:   Pt performed sit to stand with feet on floor with EO 3 reps without UE support; 5 reps with feet on Airex with EO without UE support with CGA to SBA Pt performed ladder  activity on floor - to increase SLS and step length; pt performed 4 reps with CGA  Pt  performed ambulating tossing/catching ball 20' x 2 reps with CGA  NeuroRe-ed: Pt stood on Airex - 30 secs with EO:  30 secs with EC   Standing Balance: Surface: Airex Position: Feet Hip Width Apart Completed with: Eyes Open and Eyes Closed; Head Turns x 5 Reps and Head Nods x 5 Reps    Marching on Airex with EO 5 reps each LE:  stepping down to floor and back up onto Airex 5 reps each LE with CGA to min assist  Pt performed stepping strategy exercise - standing on floor - stepping forward/back with cues to take big step for large amplitude movement     HEP : Access Code: [email or web address] URL: [email or web address] Date: 09/18/2024 Prepared by: Rock Kussmaul  Exercises - Standing Marching  - 1 x daily - 7 x weekly - 1 sets - 10 reps - Single Leg Stance with Support  - 1 x daily - 7 x weekly - 1 sets - 1-2 reps - 10 sec hold - Standing Balance with Eyes Open  - 1 x daily - 7 x weekly - 1 sets - 10 reps - Standing Balance with Eyes Closed  - 1 x daily - 7 x weekly - 1 sets - 10 reps   PATIENT EDUCATION: Education details:  Medbridge  Person educated: Patient and Spouse Education method: Explanation Education comprehension: verbalized understanding and needs further education  HOME EXERCISE PROGRAM: To be provided  GOALS: Goals reviewed with patient? Yes  SHORT TERM GOALS: = LTG based on PT POC length   LONG TERM GOALS: Target date: 10/07/24;  New target date per recert 10-28-24  Pt will be independent with final HEP for improved symptom report  Baseline: to be provided  Goal status: INITIAL  2.  MSQ goal;  Baseline: to be completed  Goal status: N/A - no dizziness reported on 09-15-24  3.  MCTSIB goal:  stand with EC on floor (condition 2) for 15 secs to demo improved vestibular input in maintaining balance Baseline: to be completed; 6 secs on 09-15-24 Goal status: Goal met 09-29-24  4.  Patient will demonstrate a convergence of </=10cm to demonstrate improved  binocular functioning Baseline: 15cm Goal status: INITIAL  ASSESSMENT:  CLINICAL IMPRESSION: PT session focused on gait training to increase step length and balance activities to increase vestibular input in maintaining balance.  Pt reported no dizziness in today's session.  Today's visit was 3rd session since initial eval; pt requested to not attend PT next week due to Thanksgiving holiday. Recert completed as POC end date 10-07-24.  Pt demonstrates improved gait pattern with increased step length noted compared to that exhibited in 1st PT session after eval.  Pt also demonstrates improved balance with pt meeting LTG#3 as he is now able to stand for 30 secs on floor and 30 secs on Airex with EC.  Cont with POC.   OBJECTIVE IMPAIRMENTS: Abnormal gait, decreased balance, decreased cognition, decreased coordination, decreased endurance, decreased knowledge of condition, decreased mobility, dizziness, and impaired vision/preception.   ACTIVITY LIMITATIONS: stairs, transfers, bathing, dressing, reach over head, hygiene/grooming, locomotion level, and caring for others  PARTICIPATION LIMITATIONS: meal prep, cleaning, medication management, interpersonal relationship, driving, shopping, community activity, and yard work  PERSONAL FACTORS: Age, Fitness, Past/current experiences, Time since onset of injury/illness/exacerbation, Transportation, and 3+ comorbidities: see above are also affecting patient's functional outcome.   REHAB POTENTIAL: Fair unknown  etiology   CLINICAL DECISION MAKING: Unstable/unpredictable  EVALUATION COMPLEXITY: High   PLAN:  PT FREQUENCY: 1-2x/week  PT DURATION: 4 weeks  PLANNED INTERVENTIONS: 97164- PT Re-evaluation, 97750- Physical Performance Testing, 97110-Therapeutic exercises, 97530- Therapeutic activity, 97112- Neuromuscular re-education, 97535- Self Care, [id number]- Manual therapy, [phone]- Gait training, [phone]- Canalith repositioning, [phone]- Aquatic Therapy,  Patient/Family education, Balance training, Stair training, Vestibular training, Visual/preceptual remediation/compensation, Cognitive remediation, and DME instructions  PLAN FOR NEXT SESSION: Plan D/C next session (pt feels ready to finish up next session and states he can continue HEP at home); check remaining LTG's and D/C    Payton Moder, [place], PT   09/29/2024, 8:15 PM

## 2024-10-13 ENCOUNTER — Ambulatory Visit: Admitting: Physical Therapy

## 2024-10-13 ENCOUNTER — Encounter: Payer: Self-pay | Admitting: Physical Therapy

## 2024-10-13 DIAGNOSIS — R42 Dizziness and giddiness: Secondary | ICD-10-CM | POA: Diagnosis present

## 2024-10-13 DIAGNOSIS — R2681 Unsteadiness on feet: Secondary | ICD-10-CM | POA: Diagnosis present

## 2024-10-13 NOTE — Therapy (Signed)
 OUTPATIENT PHYSICAL THERAPY VESTIBULAR TREATMENT NOTE/DISCHARGE SUMMARY     Patient Name: Nicholas [name] MRN: [id number] DOB:07-03-1941, 83 y.o., male Today's Date: 10/13/2024  PHYSICAL THERAPY DISCHARGE SUMMARY  Visits from Start of Care: 5  Current functional level related to goals / functional outcomes: See LTGs/Clinical Assessment Statement   Remaining deficits: Vision impairments (pt reporting double vision - pt has hx of this but has now gotten worse), impaired balance, decr functional strength   Education / Equipment: HEP, educated pt to follow up with eye doctor regarding vision changes and double vision (this was also mentioned to pt at eval)   Patient agrees to discharge. Patient goals were partially met. Patient is being discharged due to being pleased with the current functional level.   END OF SESSION:  PT End of Session - 10/13/24 0932     Visit Number 5    Number of Visits 9    Date for Recertification  11/04/24    Authorization Type HTA    Authorization Time Period 09-29-24 - 11-04-24    PT Start Time 0931    PT Stop Time 1009    PT Time Calculation (min) 38 min    Equipment Utilized During Treatment Gait belt    Activity Tolerance Patient tolerated treatment well    Behavior During Therapy WFL for tasks assessed/performed             Past Medical History:  Diagnosis Date   Anemia    PO IRON    Aortic stenosis, mild    Cataract    Colonic hemorrhage 11/10/1962   Crohn's disease (HCC)    GERD (gastroesophageal reflux disease)    Heart murmur    Hyperlipidemia    Macular degeneration    PAC (premature atrial contraction)    Palpitations    Sepsis (HCC) 02/23/2015   SIRS (systemic inflammatory response syndrome) (HCC) 02/23/2015   Sleep apnea, obstructive 08/28/2020   on CPAP   Squamous cell skin cancer    Past Surgical History:  Procedure Laterality Date   APPENDECTOMY     Biopsy Facial  05/10/2019   BLADDER REPAIR  1964    intestine leaks/bladder repair   CATARACT EXTRACTION W/ INTRAOCULAR LENS  IMPLANT, BILATERAL     2013   ILEOSTOMY  04/16/2012   Procedure: ILEOSTOMY;  Surgeon: Elon CHRISTELLA Pacini, MD;  Location: [facility] OR;  Service: General;  Laterality: N/A;   ILEOSTOMY CLOSURE  12/07/2012   ILEOSTOMY CLOSURE  12/07/2012   Procedure: ILEOSTOMY TAKEDOWN;  Surgeon: Elon CHRISTELLA Pacini, MD;  Location: [facility] OR;  Service: General;  Laterality: N/A;   LAPAROTOMY  04/16/2012   Procedure: EXPLORATORY LAPAROTOMY;  Surgeon: Elon CHRISTELLA Pacini, MD;  Location: [facility] OR;  Service: General;  Laterality: N/A;   LYSIS OF ADHESION  12/07/2012   Procedure: LYSIS OF ADHESION;  Surgeon: Elon CHRISTELLA Pacini, MD;  Location: [facility] OR;  Service: General;  Laterality: N/A;   PROCTOSCOPY  12/07/2012   Procedure: PROCTOSCOPY;  Surgeon: Elon CHRISTELLA Pacini, MD;  Location: [facility] OR;  Service: General;;  ILEO PROCTOSCOPY   SKIN CANCER EXCISION     STOMACH SURGERY  1959   ulcer   TONSILLECTOMY     Patient Active Problem List   Diagnosis Date Noted   Dizziness 04/26/2024   Ear itch 04/26/2024   Abnormal weight loss 03/16/2024   Chronic kidney disease, stage 3a (HCC) 03/16/2024   Daytime somnolence 03/16/2024   Herpes zoster without complication 03/16/2024   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, with stage 1 through stage 4 chronic kidney disease, or unspecified chronic kidney disease 03/16/2024   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   Memory loss 03/16/2024   Obstructive sleep apnea 03/16/2024   Other long term (current) drug therapy 03/16/2024   PAD (peripheral artery disease) 03/16/2024   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   Common bile duct dilation 06/13/2021   Essential hypertension 04/16/2020   Paronychia of left index finger 02/03/2020   Cellulitis of finger of left hand 01/11/2020   Chronic calcific pancreatitis  (HCC) 01/05/2020   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   Insomnia 10/03/2019   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   Encounter for screening for other disorder 09/21/2018   Pain in right knee 09/21/2018   Right knee pain 09/21/2018   Encounter for general adult medical examination without abnormal findings 09/14/2018   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   Ganglion cyst of dorsum of left wrist 07/14/2018   Ganglion of right wrist 07/01/2018   Dementia (HCC) 09/14/2017   Benign paroxysmal positional vertigo 05/28/2017   Bilateral hearing loss 05/28/2017   Disequilibrium 07/16/2016   Hemorrhage of rectum and anus 04/21/2016   Sepsis (HCC) 02/28/2015   Impaired fasting glucose 09/04/2014   Vitamin D deficiency 09/04/2014   Impacted cerumen of right ear 05/30/2014   Disorder of skin or subcutaneous tissue 02/28/2014   Fatigue 02/28/2014   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   Atrial tachycardia 12/11/2012   Protein calorie malnutrition 05/10/2012   Nonspecific (abnormal) findings on radiological and other examination of gastrointestinal tract 04/10/2012   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 Sometimes will feel it if he gets up too quickly. No falls. Been doing his exercises. Is in agreement to D/C today.   Pt accompanied by:   wife  PERTINENT HISTORY: anemia, cataracts, GERD, heart murmur, HLD, skin CA  PAIN:  Are you having pain? No  PRECAUTIONS: Fall   WEIGHT BEARING RESTRICTIONS: No  FALLS: Has patient fallen in last 6 months? No  LIVING ENVIRONMENT: Lives with: lives with their family Lives in: House/apartment Stairs: Yes: External: 7-8 steps; can reach both Has following equipment at home: Single point cane, Walker - 2 wheeled, Wheelchair (manual), and Grab bars  PLOF: Independent not driving  PATIENT GOALS: make me young again;    OBJECTIVE:  Note: Objective measures were completed at Evaluation unless otherwise noted.  DIAGNOSTIC FINDINGS: 03/22/24 brain MRI IMPRESSION: MRI scan of the brain with and without contrast showing moderate degrees of age-related changes of chronic small vessel disease and generalized cerebral atrophy.  There are also incidental changes of chronic paranasal sinusitis.  Overall no significant change compared with previous MRI from 04/22/2022.  05/02/24 carotid US  IMPRESSION: 1. No significant stenosis in either internal carotid artery. 2. There is trace smooth atherosclerotic plaque  on the left without evidence of stenosis. 3. Vertebral arteries are patent with normal antegrade flow.  COGNITION: Overall cognitive status: History of cognitive impairments - at baseline   SENSATION: WFL   POSTURE:  No Significant postural limitations  Cervical ROM:   WFL, pain free  STRENGTH: WFL   BED MOBILITY:  Independent, wife and patient deny dizziness   GAIT: Gait pattern: step through pattern, decreased arm swing- Right, decreased arm swing- Left, decreased stride length, decreased hip/knee flexion- Right, decreased hip/knee flexion- Left, Right foot flat, Left foot flat, shuffling, festinating, decreased trunk rotation, trunk flexed, and narrow BOS Distance walked: clinic Assistive device utilized: None Level of assistance: SBA and CGA   VESTIBULAR  ASSESSMENT:  GENERAL OBSERVATION: NAD, no AD, shuffling gait   SYMPTOM BEHAVIOR:  Subjective history: vertical diplopia with Lward gaze   Non-Vestibular symptoms: none reported  Type of dizziness: Spinning/Vertigo  Frequency: daily  Duration: ~1 min  Aggravating factors: Worse in the morning varies though  Relieving factors: no known relieving factors and medication  Progression of symptoms: unchanged  OCULOMOTOR EXAM:  Ocular Alignment: normal  Ocular ROM: No Limitations  Spontaneous Nystagmus: absent  Gaze-Induced Nystagmus: absent  Smooth Pursuits: saccades vertical  Saccades: hypometric/undershoots to the L   Convergence/Divergence: ~15 cm   Cover-cross-cover test: Normal  VESTIBULAR - OCULAR REFLEX:   Slow VOR: Normal  VOR Cancellation: Corrective Saccades  Head-Impulse Test: HIT Right: positive HIT Left: positive  Dynamic Visual Acuity: TBA   POSITIONAL TESTING: TBA PRN  MOTION SENSITIVITY:  Motion Sensitivity Quotient Intensity: 0 = none, 1 = Lightheaded, 2 = Mild, 3 = Moderate, 4 = Severe, 5 = Vomiting  Intensity  1. Sitting to supine 0  2. Supine to L side 0  3. Supine to R side 0  4. Supine to sitting 1  5. L Hallpike-Dix N/A  6. Up from L  N/A  7. R Hallpike-Dix N/A  8. Up from R  N/A  9. Sitting, head tipped to L knee 0  10. Head up from L knee 0  11. Sitting, head tipped to R knee 0  12. Head up from R knee 0  13. Sitting head turns x5 0  14.Sitting head nods x5 0  15. In stance, 180 turn to L  0  16. In stance, 180 turn to R 0 = reported seeing the pillows moving when turning from Rt side back to neutral     There were no vitals filed for this visit.                                                                                                                            TREATMENT: 10/13/24  Self-Care/Therapeutic Activity:  Convergence: Attempted to check as part of LTG, but pt reporting seeing double before even attempting to test  Pt  has macular degeneration and has had cataract surgery years ago. Pt originally reports double vision when looking to the L,  but when going to assess convergence, pt reporting seeing double of a pen about >20 cm away. Pt reports that he has had double vision more noticeably in the past few weeks. Reports last time he saw an eye doctor was in June and pt reports that they are aware of the double vision he has when looking towards the L, but not that it is getting worse. Educated pt and his wife to prioritize calling the eye doctor after appt today, pt and pt's spouse verbalize understanding. Pt reports that he is also currently driving and sometimes has to close one eye to not have diplopia. PT addressed significant safety concerns with this and would recommend pt not drive until this is addressed. Pt verbalized understanding.   Pt also has reported worsening memory/cognition in the past few weeks. Also advised pt to let his PCP/neurologist know regarding any changes or worsening of sx.   Access Code: [email or web address] URL: [email or web address] Date: 09/18/2024 Prepared by: Rock Kussmaul  Reviewed HEP and educated to continue to work on it at home as pt reports that it has helped his dizziness:   Exercises - Standing Marching  - 1 x daily - 7 x weekly - 1 sets - 10 reps - Single Leg Stance with Support  - 1 x daily - 7 x weekly - 1 sets - 1-2 reps - 10 sec hold - Standing Balance with Eyes Open  - 1 x daily - 7 x weekly - 1 sets - 10 reps - on pillow, 10 reps head turns , 10 reps head nods  - Standing Balance with Eyes Closed  - 1 x daily - 7 x weekly - 1 sets - 10 reps - on pillow, 2 x 5 reps head turns, 2 x 5 reps head nods, with fingertip support on chair    PATIENT EDUCATION: Education details:  See above, reviewed HEP, D/C from PT, making an eye doctor appt due to double vision  Person educated: Patient and Spouse Education method: Explanation, Demonstration, and Verbal cues Education  comprehension: verbalized understanding and needs further education  HOME EXERCISE PROGRAM: HEP : Access Code: [email or web address] URL: [email or web address] Date: 09/18/2024 Prepared by: Rock Kussmaul  Exercises - Standing Marching  - 1 x daily - 7 x weekly - 1 sets - 10 reps - Single Leg Stance with Support  - 1 x daily - 7 x weekly - 1 sets - 1-2 reps - 10 sec hold - Standing Balance with Eyes Open  - 1 x daily - 7 x weekly - 1 sets - 10 reps - Standing Balance with Eyes Closed  - 1 x daily - 7 x weekly - 1 sets - 10 reps  GOALS: Goals reviewed with patient? Yes  SHORT TERM GOALS: = LTG based on PT POC length   LONG TERM GOALS: Target date: 10/07/24;  New target date per recert 10-28-24  Pt will be independent with final HEP for improved symptom report  Baseline:  Goal status: MET  2.  MSQ goal;  Baseline: to be completed  Goal status: N/A - no dizziness reported on 09-15-24  3.  MCTSIB goal:  stand with EC on floor (condition 2) for 15 secs to demo improved vestibular input in maintaining balance Baseline: to be completed; 6 secs on 09-15-24 Goal status: Goal met 09-29-24  4.  Patient will demonstrate a convergence of </=10cm to demonstrate improved binocular functioning Baseline: 15cm  Unable to assess as pt already seeing double at ~20 cm  Goal  status: NOT MET  ASSESSMENT:  CLINICAL IMPRESSION: Today's skilled session focused on assessing remainder of LTGs for anticipated D/C. Pt reports improvements in his dizziness since starting therapy, but will just get dizzy when standing up too quickly. Unable to accurately assess convergence goal. Pt already reporting diplopia when trying to look at pen. Pt reports that he notices a worsening of his double vision recently.  Pt normally having vertical diplopia with leftward gaze and reports his eye doctor is aware of this (but not aware of worsening diplopia). Educated pt and pt's spouse to call to get into eye doctor sooner  regarding this. Pt in agreement to D/C at this time with plan to continue balance HEP. Educated pt to make sure he follows up with appropriate providers regarding vision and cognitive changes.  OBJECTIVE IMPAIRMENTS: Abnormal gait, decreased balance, decreased cognition, decreased coordination, decreased endurance, decreased knowledge of condition, decreased mobility, dizziness, and impaired vision/preception.   ACTIVITY LIMITATIONS: stairs, transfers, bathing, dressing, reach over head, hygiene/grooming, locomotion level, and caring for others  PARTICIPATION LIMITATIONS: meal prep, cleaning, medication management, interpersonal relationship, driving, shopping, community activity, and yard work  PERSONAL FACTORS: Age, Fitness, Past/current experiences, Time since onset of injury/illness/exacerbation, Transportation, and 3+ comorbidities: see above are also affecting patient's functional outcome.   REHAB POTENTIAL: Fair unknown etiology   CLINICAL DECISION MAKING: Unstable/unpredictable  EVALUATION COMPLEXITY: High   PLAN:  PT FREQUENCY: 1-2x/week  PT DURATION: 4 weeks  PLANNED INTERVENTIONS: 02835- PT Re-evaluation, 97750- Physical Performance Testing, 97110-Therapeutic exercises, 97530- Therapeutic activity, [id number]- Neuromuscular re-education, 97535- Self Care, 02859- Manual therapy, [phone]- Gait training, [phone]- Canalith repositioning, [phone]- Aquatic Therapy, Patient/Family education, Balance training, Stair training, Vestibular training, Visual/preceptual remediation/compensation, Cognitive remediation, and DME instructions  PLAN FOR NEXT SESSION: D/C   [place]ffield LOISE Senate, PT, DPT   10/13/2024, 10:11 AM

## 2024-10-17 ENCOUNTER — Emergency Department (HOSPITAL_COMMUNITY)

## 2024-10-17 ENCOUNTER — Encounter (HOSPITAL_COMMUNITY): Payer: Self-pay | Admitting: Internal Medicine

## 2024-10-17 ENCOUNTER — Other Ambulatory Visit: Payer: Self-pay

## 2024-10-17 ENCOUNTER — Inpatient Hospital Stay (HOSPITAL_COMMUNITY): Admission: EM | Admit: 2024-10-17 | Discharge: 2024-10-19 | DRG: 386 | Disposition: A

## 2024-10-17 DIAGNOSIS — N1831 Chronic kidney disease, stage 3a: Secondary | ICD-10-CM | POA: Diagnosis present

## 2024-10-17 DIAGNOSIS — Z9049 Acquired absence of other specified parts of digestive tract: Secondary | ICD-10-CM | POA: Diagnosis not present

## 2024-10-17 DIAGNOSIS — I480 Paroxysmal atrial fibrillation: Secondary | ICD-10-CM | POA: Diagnosis not present

## 2024-10-17 DIAGNOSIS — R000 Tachycardia, unspecified: Secondary | ICD-10-CM | POA: Diagnosis not present

## 2024-10-17 DIAGNOSIS — E785 Hyperlipidemia, unspecified: Secondary | ICD-10-CM | POA: Diagnosis present

## 2024-10-17 DIAGNOSIS — R4182 Altered mental status, unspecified: Secondary | ICD-10-CM | POA: Diagnosis not present

## 2024-10-17 DIAGNOSIS — A419 Sepsis, unspecified organism: Secondary | ICD-10-CM | POA: Diagnosis not present

## 2024-10-17 DIAGNOSIS — I499 Cardiac arrhythmia, unspecified: Secondary | ICD-10-CM | POA: Diagnosis not present

## 2024-10-17 DIAGNOSIS — K50919 Crohn's disease, unspecified, with unspecified complications: Secondary | ICD-10-CM | POA: Diagnosis not present

## 2024-10-17 DIAGNOSIS — K861 Other chronic pancreatitis: Secondary | ICD-10-CM | POA: Diagnosis present

## 2024-10-17 DIAGNOSIS — F0390 Unspecified dementia without behavioral disturbance: Secondary | ICD-10-CM | POA: Diagnosis not present

## 2024-10-17 DIAGNOSIS — E039 Hypothyroidism, unspecified: Secondary | ICD-10-CM | POA: Diagnosis not present

## 2024-10-17 DIAGNOSIS — I6782 Cerebral ischemia: Secondary | ICD-10-CM | POA: Diagnosis not present

## 2024-10-17 DIAGNOSIS — I10 Essential (primary) hypertension: Secondary | ICD-10-CM | POA: Diagnosis present

## 2024-10-17 DIAGNOSIS — I739 Peripheral vascular disease, unspecified: Secondary | ICD-10-CM | POA: Diagnosis not present

## 2024-10-17 DIAGNOSIS — I131 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, with stage 1 through stage 4 chronic kidney disease, or unspecified chronic kidney disease: Secondary | ICD-10-CM | POA: Diagnosis present

## 2024-10-17 DIAGNOSIS — R531 Weakness: Secondary | ICD-10-CM | POA: Diagnosis not present

## 2024-10-17 DIAGNOSIS — I959 Hypotension, unspecified: Secondary | ICD-10-CM | POA: Diagnosis not present

## 2024-10-17 DIAGNOSIS — R6510 Systemic inflammatory response syndrome (SIRS) of non-infectious origin without acute organ dysfunction: Secondary | ICD-10-CM | POA: Diagnosis not present

## 2024-10-17 DIAGNOSIS — G4733 Obstructive sleep apnea (adult) (pediatric): Secondary | ICD-10-CM | POA: Diagnosis not present

## 2024-10-17 DIAGNOSIS — E02 Subclinical iodine-deficiency hypothyroidism: Secondary | ICD-10-CM | POA: Diagnosis not present

## 2024-10-17 DIAGNOSIS — K5090 Crohn's disease, unspecified, without complications: Secondary | ICD-10-CM | POA: Diagnosis present

## 2024-10-17 DIAGNOSIS — W19XXXA Unspecified fall, initial encounter: Secondary | ICD-10-CM | POA: Diagnosis not present

## 2024-10-17 DIAGNOSIS — I672 Cerebral atherosclerosis: Secondary | ICD-10-CM | POA: Diagnosis not present

## 2024-10-17 LAB — I-STAT CHEM 8, ED
BUN: 15 mg/dL (ref 8–23)
Calcium, Ion: 1.04 mmol/L — ABNORMAL LOW (ref 1.15–1.40)
Chloride: 105 mmol/L (ref 98–111)
Creatinine, Ser: 1.1 mg/dL (ref 0.61–1.24)
Glucose, Bld: 132 mg/dL — ABNORMAL HIGH (ref 70–99)
HCT: 45 % (ref 39.0–52.0)
Hemoglobin: 15.3 g/dL (ref 13.0–17.0)
Potassium: 3.9 mmol/L (ref 3.5–5.1)
Sodium: 139 mmol/L (ref 135–145)
TCO2: 22 mmol/L (ref 22–32)

## 2024-10-17 LAB — BASIC METABOLIC PANEL WITH GFR
Anion gap: 10 (ref 5–15)
BUN: 13 mg/dL (ref 8–23)
CO2: 22 mmol/L (ref 22–32)
Calcium: 7.8 mg/dL — ABNORMAL LOW (ref 8.9–10.3)
Chloride: 104 mmol/L (ref 98–111)
Creatinine, Ser: 1.19 mg/dL (ref 0.61–1.24)
GFR, Estimated: 60 mL/min — ABNORMAL LOW (ref >60)
Glucose, Bld: 131 mg/dL — ABNORMAL HIGH (ref 70–99)
Potassium: 3.8 mmol/L (ref 3.5–5.1)
Sodium: 136 mmol/L (ref 135–145)

## 2024-10-17 LAB — URINALYSIS, ROUTINE W REFLEX MICROSCOPIC
Bacteria, UA: NONE SEEN
Bilirubin Urine: NEGATIVE
Glucose, UA: NEGATIVE mg/dL
Ketones, ur: 5 mg/dL — AB
Leukocytes,Ua: NEGATIVE
Nitrite: NEGATIVE
Protein, ur: 30 mg/dL — AB
Specific Gravity, Urine: 1.017 (ref 1.005–1.030)
pH: 6 (ref 5.0–8.0)

## 2024-10-17 LAB — RESP PANEL BY RT-PCR (RSV, FLU A&B, COVID)  RVPGX2
Influenza A by PCR: NEGATIVE
Influenza B by PCR: NEGATIVE
Resp Syncytial Virus by PCR: NEGATIVE
SARS Coronavirus 2 by RT PCR: NEGATIVE

## 2024-10-17 LAB — CBC
HCT: 44.9 % (ref 39.0–52.0)
Hemoglobin: 15 g/dL (ref 13.0–17.0)
MCH: 32.3 pg (ref 26.0–34.0)
MCHC: 33.4 g/dL (ref 30.0–36.0)
MCV: 96.6 fL (ref 80.0–100.0)
Platelets: 116 K/uL — ABNORMAL LOW (ref 150–400)
RBC: 4.65 MIL/uL (ref 4.22–5.81)
RDW: 14.7 % (ref 11.5–15.5)
WBC: 9.2 K/uL (ref 4.0–10.5)
nRBC: 0 % (ref 0.0–0.2)

## 2024-10-17 LAB — I-STAT CG4 LACTIC ACID, ED
Lactic Acid, Venous: 3.3 mmol/L (ref 0.5–1.9)
Lactic Acid, Venous: 2.1 mmol/L (ref 0.5–1.9)

## 2024-10-17 MED ORDER — LACTATED RINGERS IV SOLN: INTRAVENOUS | Status: DC

## 2024-10-17 MED ORDER — ACETAMINOPHEN 650 MG RE SUPP: 650.0000 mg | Freq: Four times a day (QID) | RECTAL | Status: DC | PRN

## 2024-10-17 MED ORDER — METRONIDAZOLE 500 MG/100ML IV SOLN
500.0000 mg | Freq: Once | INTRAVENOUS | Status: AC
  Administered 2024-10-17: 500 mg via INTRAVENOUS
  Filled 2024-10-17: qty 100

## 2024-10-17 MED ORDER — ENOXAPARIN SODIUM 40 MG/0.4ML IJ SOSY
40.0000 mg | PREFILLED_SYRINGE | INTRAMUSCULAR | Status: DC
  Administered 2024-10-17: 40 mg via SUBCUTANEOUS
  Filled 2024-10-17: qty 0.4

## 2024-10-17 MED ORDER — METRONIDAZOLE 500 MG/100ML IV SOLN
500.0000 mg | Freq: Two times a day (BID) | INTRAVENOUS | Status: DC
  Administered 2024-10-17 – 2024-10-18 (×2): 500 mg via INTRAVENOUS
  Filled 2024-10-17 (×2): qty 100

## 2024-10-17 MED ORDER — VANCOMYCIN HCL IN DEXTROSE 1-5 GM/200ML-% IV SOLN
1000.0000 mg | INTRAVENOUS | Status: DC
  Administered 2024-10-18: 1000 mg via INTRAVENOUS
  Filled 2024-10-17 (×2): qty 200

## 2024-10-17 MED ORDER — VANCOMYCIN HCL 1500 MG/300ML IV SOLN
1500.0000 mg | Freq: Once | INTRAVENOUS | Status: AC
  Administered 2024-10-17: 1500 mg via INTRAVENOUS
  Filled 2024-10-17: qty 300

## 2024-10-17 MED ORDER — ASPIRIN 81 MG PO CHEW
81.0000 mg | CHEWABLE_TABLET | Freq: Every day | ORAL | Status: DC
  Administered 2024-10-18 – 2024-10-19 (×2): 81 mg via ORAL
  Filled 2024-10-17 (×2): qty 1

## 2024-10-17 MED ORDER — MEMANTINE HCL 10 MG PO TABS
5.0000 mg | ORAL_TABLET | Freq: Every day | ORAL | Status: DC
  Administered 2024-10-18 – 2024-10-19 (×2): 5 mg via ORAL
  Filled 2024-10-17 (×2): qty 1

## 2024-10-17 MED ORDER — SODIUM CHLORIDE 0.9% FLUSH
3.0000 mL | Freq: Two times a day (BID) | INTRAVENOUS | Status: DC
  Administered 2024-10-17 – 2024-10-19 (×5): 3 mL via INTRAVENOUS

## 2024-10-17 MED ORDER — SODIUM CHLORIDE 0.9 % IV SOLN
2.0000 g | Freq: Once | INTRAVENOUS | Status: AC
  Administered 2024-10-17: 2 g via INTRAVENOUS
  Filled 2024-10-17: qty 12.5

## 2024-10-17 MED ORDER — SODIUM CHLORIDE 0.9 % IV SOLN
2.0000 g | Freq: Two times a day (BID) | INTRAVENOUS | Status: DC
  Administered 2024-10-18 – 2024-10-19 (×3): 2 g via INTRAVENOUS
  Filled 2024-10-17 (×3): qty 12.5

## 2024-10-17 MED ORDER — LEVOTHYROXINE SODIUM 75 MCG PO TABS
75.0000 ug | ORAL_TABLET | Freq: Every day | ORAL | Status: DC
  Administered 2024-10-18 – 2024-10-19 (×2): 75 ug via ORAL
  Filled 2024-10-17 (×2): qty 1

## 2024-10-17 MED ORDER — LACTATED RINGERS IV BOLUS
500.0000 mL | Freq: Once | INTRAVENOUS | Status: AC
  Administered 2024-10-17: 500 mL via INTRAVENOUS

## 2024-10-17 MED ORDER — LACTATED RINGERS IV BOLUS (SEPSIS)
500.0000 mL | Freq: Once | INTRAVENOUS | Status: AC
  Administered 2024-10-17: 500 mL via INTRAVENOUS

## 2024-10-17 MED ORDER — VANCOMYCIN HCL IN DEXTROSE 1-5 GM/200ML-% IV SOLN: 1000.0000 mg | Freq: Once | INTRAVENOUS | Status: DC

## 2024-10-17 MED ORDER — ROSUVASTATIN CALCIUM 5 MG PO TABS
5.0000 mg | ORAL_TABLET | Freq: Every day | ORAL | Status: DC
  Administered 2024-10-18 – 2024-10-19 (×2): 5 mg via ORAL
  Filled 2024-10-17 (×2): qty 1

## 2024-10-17 MED ORDER — POLYETHYLENE GLYCOL 3350 17 G PO PACK: 17.0000 g | PACK | Freq: Every day | ORAL | Status: DC | PRN

## 2024-10-17 MED ORDER — ACETAMINOPHEN 325 MG PO TABS
650.0000 mg | ORAL_TABLET | Freq: Four times a day (QID) | ORAL | Status: DC | PRN
  Administered 2024-10-17: 650 mg via ORAL
  Filled 2024-10-17: qty 2

## 2024-10-17 NOTE — ED Triage Notes (Signed)
 Pt bib [facility] from home weakness starting yesterday. Fell this morning, no injuries. Initially 140-160 hr in afib after 300mL of saline 100-120 hr. Hx of afib. No thinners  80/60 20 G LAC

## 2024-10-17 NOTE — Progress Notes (Signed)
 Pharmacy Antibiotic Note  Nicholas [name] is a 83 y.o. male for which pharmacy has been consulted for cefepime  and vancomycin  dosing for sepsis. Patient with a history of HTN, HLD, CKD, hypothyroidism, AF, PAD, dementia, Crohn's s/p partial colectomy, pancreatitis.   SCr 1.1 WBC 9.2; LA 2.1; T 100.6; HR 100; RR 19  Plan: Metronidazole  per MD Cefepime  2g q12hr  Vancomycin  1500 mg once then 1000 mg q24hr (eAUC 468.5) unless change in renal function Monitor WBC, fever, renal function, cultures De-escalate when able Levels at steady state  Height: 5' 9 (175.3 cm) Weight: 68 kg (150 lb) IBW/kg (Calculated) : 70.7  Temp (24hrs), Avg:99.4 F (37.4 C), Min:98.1 F (36.7 C), Max:100.6 F (38.1 C)  Recent Labs  Lab 10/17/24 0945 10/17/24 1026 10/17/24 1126 10/17/24 1201  WBC 9.2  --   --   --   CREATININE 1.19 1.10  --   --   LATICACIDVEN  --   --  3.3* 2.1*    Estimated Creatinine Clearance: 47.2 mL/min (by C-G formula based on SCr of 1.1 mg/dL).    No Known Allergies  Microbiology results: Pending  Thank you for allowing pharmacy to be a part of this patient's care.  Dorn Buttner, PharmD, BCPS 10/17/2024 1:41 PM ED Clinical Pharmacist -  [phone]

## 2024-10-17 NOTE — Plan of Care (Signed)

## 2024-10-17 NOTE — H&P (Signed)
 History and Physical   KYIAN OBST FMW:[id number] DOB: [birth year] DOA: 10/17/2024  PCP: Yolande Toribio MATSU, MD   Patient coming from: Home  Chief Complaint: Weakness, fall  HPI: Nicholas [name] is a 83 y.o. male with medical history significant of hypertension, hyperlipidemia, CKD 3A, hypothyroidism, paroxysmal atrial fibrillation, PAD, dementia, OSA, Crohn's disease status post partial colectomy, chronic pancreatitis presenting with weakness and fall.  Patient had weakness which started yesterday, barely able to get out of bed.  This persisted into today and patient had a fall today.  EMS was called and patient's heart rate was initially 140s-160s with blood pressure in the 80s systolic.  Heart rate improved to the 100s-120s with 300 cc IV fluid and route and blood pressure also improved.  Patient denies fevers, chills, chest pain, shortness of breath, abdominal pain, constipation, diarrhea, nausea, vomiting. No specific complaints to localize possible infection.  ED Course: Vital signs in the ED notable for fever to 100.6, blood pressure in the 130s-140 systolic, heart rate in the 80s-120s.  Lab workup included BMP with glucose 131, calcium  7.8.  CBC with platelets 116.  Lactic acid 3.3 and 2.1 on repeat.  Urinalysis pending.  Blood cultures pending.  Chest x-ray showed no acute abnormality.  CT head showed no acute abnormality intracranially but did show opacification of the right frontal sinus, right ethmoid sinus and right sphenoid sinus with debris.  Patient Seve vancomycin , cefepime , Flagyl  in the ED.  1 L IV fluids ordered to be followed by a rate of 150 cc an hour.  Review of Systems: As per HPI otherwise all other systems reviewed and are negative.  Past Medical History:  Diagnosis Date   Anemia    PO IRON    Aortic stenosis, mild    Cataract    Colonic hemorrhage 11/10/1962   Crohn's disease (HCC)    GERD (gastroesophageal reflux disease)    Heart murmur     Hyperlipidemia    Macular degeneration    PAC (premature atrial contraction)    Palpitations    Sepsis (HCC) 02/23/2015   SIRS (systemic inflammatory response syndrome) (HCC) 02/23/2015   Sleep apnea, obstructive 08/28/2020   on CPAP   Squamous cell skin cancer     Past Surgical History:  Procedure Laterality Date   APPENDECTOMY     Biopsy Facial  05/10/2019   BLADDER REPAIR  1964   intestine leaks/bladder repair   CATARACT EXTRACTION W/ INTRAOCULAR LENS  IMPLANT, BILATERAL     2013   ILEOSTOMY  04/16/2012   Procedure: ILEOSTOMY;  Surgeon: Elon CHRISTELLA Pacini, MD;  Location: [facility] OR;  Service: General;  Laterality: N/A;   ILEOSTOMY CLOSURE  12/07/2012   ILEOSTOMY CLOSURE  12/07/2012   Procedure: ILEOSTOMY TAKEDOWN;  Surgeon: Elon CHRISTELLA Pacini, MD;  Location: [facility] OR;  Service: General;  Laterality: N/A;   LAPAROTOMY  04/16/2012   Procedure: EXPLORATORY LAPAROTOMY;  Surgeon: Elon CHRISTELLA Pacini, MD;  Location: [facility] OR;  Service: General;  Laterality: N/A;   LYSIS OF ADHESION  12/07/2012   Procedure: LYSIS OF ADHESION;  Surgeon: Elon CHRISTELLA Pacini, MD;  Location: [facility] OR;  Service: General;  Laterality: N/A;   PROCTOSCOPY  12/07/2012   Procedure: PROCTOSCOPY;  Surgeon: Elon CHRISTELLA Pacini, MD;  Location: [facility] OR;  Service: General;;  ILEO PROCTOSCOPY   SKIN CANCER EXCISION     STOMACH SURGERY  1959   ulcer   TONSILLECTOMY      Social History  reports that he quit smoking  about 59 years ago. His smoking use included cigarettes. He started smoking about 61 years ago. He has a 2 pack-year smoking history. He has never used smokeless tobacco. He reports that he does not drink alcohol  and does not use drugs.  No Known Allergies  Family History  Problem Relation Age of Onset   Stroke Mother    Multiple myeloma Father    Heart disease Maternal Grandfather    Heart attack Daughter    Congestive Heart Failure Daughter    Heart disease Other        maternal side   Colon cancer Neg Hx    Crohn's  disease Neg Hx    Sleep apnea Neg Hx   Reviewed on admission  Prior to Admission medications   Medication Sig Start Date End Date Taking? Authorizing Provider  amLODipine (NORVASC) 2.5 MG tablet Take 2.5 mg by mouth daily. 07/17/20   [provider]  aspirin  (ASPIRIN  CHILDRENS) 81 MG chewable tablet Chew 1 tablet (81 mg total) by mouth daily. 10/21/23   Ladona Heinz, MD  azaTHIOprine  (IMURAN ) 50 MG tablet Take 1 tablet by mouth daily. 10/07/21   [provider]  ergocalciferol (VITAMIN D2) 50000 UNITS capsule Take 50,000 Units by mouth once a week.     [provider]  ferrous sulfate 325 (65 FE) MG tablet 1 tablet Oral once daily 01/29/10   [provider]  levothyroxine  (SYNTHROID ) 75 MCG tablet Take 75 mcg by mouth daily before breakfast. 09/15/24   [provider]  loperamide (IMODIUM) 2 MG capsule Take 2 mg by mouth daily as needed for diarrhea or loose stools. Patient taking differently: Take 2 mg by mouth as needed for diarrhea or loose stools.    [provider]  losartan  (COZAAR ) 25 MG tablet TAKE 2 TABLETS (50 MG) BY MOUTH EVERY EVENING. 02/05/22   Cantwell, Celeste C, PA-C  meclizine (ANTIVERT) 12.5 MG tablet Take 12.5 mg by mouth every 8 (eight) hours as needed.    [provider]  memantine  (NAMENDA ) 5 MG tablet Take by mouth. 05/28/24   [provider]  Multiple Vitamin (MULTI VITAMIN DAILY PO) Take 1 tablet by mouth daily.    [provider]  Multiple Vitamins-Minerals (PRESERVISION AREDS 2) CAPS Take by mouth. 1 in the am 1 in the pm    Ladona Heinz, MD  mupirocin ointment (BACTROBAN) 2 % SMARTSIG:1 Application Topical 2-3 Times Daily 06/06/24   [provider]  rosuvastatin  (CRESTOR ) 5 MG tablet Take 1 tablet (5 mg total) by mouth daily. 05/03/24   Ladona Heinz, MD  testosterone  cypionate (DEPOTESTOSTERONE CYPIONATE) 200 MG/ML injection Inject 1 mL into the muscle every 14 (fourteen) days.    [provider]    Physical Exam: Vitals:   10/17/24 1100 10/17/24 1130 10/17/24 1200 10/17/24 1239  BP: 133/75 (!) 141/81 (!) 141/82   Pulse: 81 92 100   Resp: 16 18 19    Temp:    (!) 100.6 F (38.1 C)  TempSrc:    Rectal  SpO2: 96% 97% 98%   Weight:      Height:        Physical Exam Constitutional:      General: He is not in acute distress.    Appearance: Normal appearance.  HENT:     Head: Normocephalic and atraumatic.     Mouth/Throat:     Mouth: Mucous membranes are moist.     Pharynx: Oropharynx is clear.  Eyes:  Extraocular Movements: Extraocular movements intact.     Pupils: Pupils are equal, round, and reactive to light.  Cardiovascular:     Rate and Rhythm: Normal rate. Rhythm irregular.     Pulses: Normal pulses.     Heart sounds: Normal heart sounds.  Pulmonary:     Effort: Pulmonary effort is normal. No respiratory distress.     Breath sounds: Normal breath sounds.  Abdominal:     General: Bowel sounds are normal. There is no distension.     Palpations: Abdomen is soft.     Tenderness: There is no abdominal tenderness.  Musculoskeletal:        General: No swelling or deformity.  Skin:    General: Skin is warm and dry.  Neurological:     General: No focal deficit present.     Mental Status: Mental status is at baseline.    Labs on Admission: I have personally reviewed following labs and imaging studies  CBC: Recent Labs  Lab 10/17/24 0945 10/17/24 1026  WBC 9.2  --   HGB 15.0 15.3  HCT 44.9 45.0  MCV 96.6  --   PLT 116*  --     Basic Metabolic Panel: Recent Labs  Lab 10/17/24 0945 10/17/24 1026  NA 136 139  K 3.8 3.9  CL 104 105  CO2 22  --   GLUCOSE 131* 132*  BUN 13 15  CREATININE 1.19 1.10  CALCIUM  7.8*  --     GFR: Estimated Creatinine Clearance: 47.2 mL/min (by C-G formula based on SCr of 1.1 mg/dL).  Liver Function Tests: No results for input(s): AST, ALT, ALKPHOS, BILITOT, PROT, ALBUMIN  in the last 168  hours.  Urine analysis:    Component Value Date/Time   COLORURINE YELLOW 12/11/2023 2301   APPEARANCEUR HAZY (A) 12/11/2023 2301   LABSPEC 1.025 12/11/2023 2301   PHURINE 5.0 12/11/2023 2301   GLUCOSEU NEGATIVE 12/11/2023 2301   HGBUR NEGATIVE 12/11/2023 2301   BILIRUBINUR NEGATIVE 12/11/2023 2301   KETONESUR NEGATIVE 12/11/2023 2301   PROTEINUR NEGATIVE 12/11/2023 2301   UROBILINOGEN 0.2 02/23/2015 1630   NITRITE NEGATIVE 12/11/2023 2301   LEUKOCYTESUR NEGATIVE 12/11/2023 2301    Radiological Exams on Admission: DG Chest Port 1 View Result Date: 10/17/2024 EXAM: 1 VIEW(S) XRAY OF THE CHEST 10/17/2024 10:11:00 AM COMPARISON: 12/11/2023 CLINICAL HISTORY: weakness FINDINGS: LUNGS AND PLEURA: No focal pulmonary opacity. No pleural effusion. No pneumothorax. HEART AND MEDIASTINUM: No acute abnormality of the cardiac and mediastinal silhouettes. BONES AND SOFT TISSUES: No acute osseous abnormality. IMPRESSION: 1. No acute process. Electronically signed by: Evalene Coho MD 10/17/2024 10:49 AM EST RP Workstation: [id number]   CT Head Wo Contrast Result Date: 10/17/2024 EXAM: CT HEAD WITHOUT CONTRAST 10/17/2024 10:15:25 AM TECHNIQUE: CT of the head was performed without the administration of intravenous contrast. Automated exposure control, iterative reconstruction, and/or weight based adjustment of the mA/kV was utilized to reduce the radiation dose to as low as reasonably achievable. COMPARISON: CT of the head dated 12/31/2013. CLINICAL HISTORY: Mental status change, unknown cause. FINDINGS: BRAIN AND VENTRICLES: No acute hemorrhage. No evidence of acute infarct. No hydrocephalus. No extra-axial collection. No mass effect or midline shift. Age-related atrophy. Mild periventricular and deep white matter hypodensity typical of chronic small vessel ischemia. ORBITS: No acute abnormality. Bilateral cataract resection noted. SINUSES: Near complete opacification of right frontal sinus. Opacification  of right ethmoid air cells. Frothy debris in right side of sphenoid sinus. SOFT TISSUES AND SKULL: No acute soft tissue abnormality. No  skull fracture. Atherosclerosis of skullbase vasculature without hyperdense vessel or abnormal calcification. IMPRESSION: 1. No acute intracranial abnormality. 2. Near complete opacification of right frontal sinus, opacification of right ethmoid air cells, and frothy debris in right side of sphenoid sinus. Electronically signed by: Evalene Coho MD 10/17/2024 10:49 AM EST RP Workstation: [id number]   EKG: Independently reviewed.  Atrial fibrillation with RVR at 121 bpm.  Assessment/Plan Active Problems:   Crohn's disease (HCC)   Chronic calcific pancreatitis (HCC)   Chronic kidney disease, stage 3a (HCC)   Dementia (HCC)   Essential hypertension   Hyperlipidemia   Hypothyroidism   Obstructive sleep apnea   PAD (peripheral artery disease)   Paroxysmal atrial fibrillation (HCC)   S/P partial colectomy (Crohn's)   SIRS (systemic inflammatory response syndrome) (HCC)   Weakness SIRS > Patient presenting with 2 days of significant weakness. > Found to meet SIRS criteria with fever, tachycardia, initially low blood pressure per EMS. > Lactic acid initially elevated to 3.3 which improved to 2.1 after 500 cc IV fluid. > No leukocytosis the patient does take azathioprine  and may be slow to mount immune response given his age; will consider viral triggers while covering for bacterial infection. > Chest x-ray showed no acute normality.  Urinalysis pending.  Blood cultures pending. > Concern is for sepsis but no source yet identified.  Could be that he had a viral illness and this triggered his A-fib with RVR which led to this worsening weakness and other abnormalities. > Received vancomycin , cefepime , Flagyl  in the ED as well as IV fluids. - Monitor in progressive unit overnight - Continue with antibiotics while determining etiology (Vancomycin , Cefepime ,  Flagyl ) - Follow-up urinalysis, blood culture - Check flu COVID RSV panel - Continue IV fluids - Trend fever curve and WBC - Hold azathioprine   Hypertension - Will hold amlodipine and losartan  for today with concern for developing sepsis  Hyperlipidemia - Continue home rosuvastatin   CKD 3A > Creatinine stable in the ED. - Trend renal function and electrolytes  Hypothyroidism - Continue home Synthroid   Paroxysmal atrial fibrillation > Not on rate control,, rhythm control, nor anticoagulation > Initially in RVR but this improved with fluids alone - Continue to monitor on progressive unit  PAD - Continue home ASA, rosuvastatin   Dementia - Continue home Namenda   OSA - CPAP qhs  Crohn's disease > Status post partial colectomy - Holding azathioprine  as above  DVT prophylaxis: Lovenox  Code Status:   Full Family Communication:  Updated at bedside  Disposition Plan:   Patient is from:  Home  Anticipated DC to:  Home  Anticipated DC date:  2 to 4 days  Anticipated DC barriers: None  Consults called:  None Admission status:  Inpatient, progressive  Severity of Illness: The appropriate patient status for this patient is INPATIENT. Inpatient status is judged to be reasonable and necessary in order to provide the required intensity of service to ensure the patient's safety. The patient's presenting symptoms, physical exam findings, and initial radiographic and laboratory data in the context of their chronic comorbidities is felt to place them at high risk for further clinical deterioration. Furthermore, it is not anticipated that the patient will be medically stable for discharge from the hospital within 2 midnights of admission.   * I certify that at the point of admission it is my clinical judgment that the patient will require inpatient hospital care spanning beyond 2 midnights from the point of admission due to high intensity of service, high risk for  further deterioration  and high frequency of surveillance required.DEWAINE Marsa KATHEE Seena MD [facility]  How to contact the [facility] Attending or Consulting provider 7A - 7P or covering provider during after hours 7P -7A, for this patient?   Check the care team in [facility] and look for a) attending/consulting [facility] provider listed and b) the [facility] team listed Log into [email or web address] and use [place] universal password to access. If you do not have the password, please contact the hospital operator. Locate the [facility] provider you are looking for under [facility] and page to a number that you can be directly reached. If you still have difficulty reaching the provider, please page the [facility] (Director on Call) for the Hospitalists listed on amion for assistance.  10/17/2024, 1:23 PM

## 2024-10-17 NOTE — ED Notes (Signed)
 CCMD contacted to connect pt to monitor.

## 2024-10-17 NOTE — Sepsis Progress Note (Signed)
 Sepsis protocol is being followed by eLink.

## 2024-10-17 NOTE — Progress Notes (Signed)
   Brief Progress Note   _____________________________________________________________________________________________________________  Patient Name: Nicholas [name] Patient DOB: 1941-07-03 Date: @TODAY @      Data: Reviewed labs, vital signs, and notes.    Action: Reached out to CHARM Louder, ED RN to inquire if pt agreeable to transfer to [facility] for admission    Response:  Per RN, patient's wife wishes to stay at [facility] until room opens up.  _____________________________________________________________________________________________________________  [facility] RN Expeditor Haitham Dolinsky S Jamorris Ndiaye Please contact us  directly via secure chat (search for [facility]) or by calling us  at [phone] [facility]).

## 2024-10-17 NOTE — ED Notes (Signed)
 Pt attempting to use urinal. Advised pt that if he is unable to provide urine, we will have to do an in and out catheter to obtain urine sample. Pt verbalized understanding.

## 2024-10-17 NOTE — ED Provider Notes (Signed)
 [facility] AT [facility] Provider Note   CSN: [id number] Arrival date & time: 10/17/24  9073     Patient presents with: Weakness and Fall   Nicholas [name] is a 83 y.o. male.   HPI 83 year old male history of Crohn's disease, atrial fibrillation, hypertension, hyperlipidemia, presents today with generalized weakness.  Triage summary notes that patient was brought in from [facility] with weakness that started yesterday.  He fell this morning with no injuries.  He is not on any blood thinners.  Patient tells me he was well when he went to bed last night but was weak when he woke up this morning.   1:18 PM Wife at bedside.  She reports that patient was normal self on Saturday.  Yesterday he was too weak to get out of bed.  She states they eventually were able to get him out of bed by pulling a chair next to the bed and him using as a walker.  He does not normally use a walker.  However he then had to sit down on the floor in the bathroom was unable to get up.  Granddaughter and her boyfriend came over and helped to move to the bed.  He seemed as weak today.  Secondary to this ongoing weakness she called EMS. She reports that he did not have much of anything to eat or drink yesterday.  He did not have any loose stools.  She did not note any fever.  He is wearing a depends but she does not think that he had any UTI symptoms.  Prior to Admission medications   Medication Sig Start Date End Date Taking? Authorizing Provider  amLODipine (NORVASC) 2.5 MG tablet Take 2.5 mg by mouth daily. 07/17/20   [provider]  aspirin  (ASPIRIN  CHILDRENS) 81 MG chewable tablet Chew 1 tablet (81 mg total) by mouth daily. 10/21/23   Ladona Heinz, MD  azaTHIOprine  (IMURAN ) 50 MG tablet Take 1 tablet by mouth daily. 10/07/21   [provider]  ergocalciferol (VITAMIN D2) 50000 UNITS capsule Take 50,000 Units by mouth once a week.     [provider]   ferrous sulfate 325 (65 FE) MG tablet 1 tablet Oral once daily 01/29/10   [provider]  levothyroxine  (SYNTHROID ) 75 MCG tablet Take 75 mcg by mouth daily before breakfast. 09/15/24   [provider]  loperamide (IMODIUM) 2 MG capsule Take 2 mg by mouth daily as needed for diarrhea or loose stools. Patient taking differently: Take 2 mg by mouth as needed for diarrhea or loose stools.    [provider]  losartan  (COZAAR ) 25 MG tablet TAKE 2 TABLETS (50 MG) BY MOUTH EVERY EVENING. 02/05/22   Cantwell, Celeste C, PA-C  meclizine (ANTIVERT) 12.5 MG tablet Take 12.5 mg by mouth every 8 (eight) hours as needed.    [provider]  memantine  (NAMENDA ) 5 MG tablet Take by mouth. 05/28/24   [provider]  Multiple Vitamin (MULTI VITAMIN DAILY PO) Take 1 tablet by mouth daily.    [provider]  Multiple Vitamins-Minerals (PRESERVISION AREDS 2) CAPS Take by mouth. 1 in the am 1 in the pm    Ladona Heinz, MD  mupirocin ointment (BACTROBAN) 2 % SMARTSIG:1 Application Topical 2-3 Times Daily 06/06/24   [provider]  rosuvastatin  (CRESTOR ) 5 MG tablet Take 1 tablet (5 mg total) by mouth daily. 05/03/24   Ladona Heinz, MD  testosterone  cypionate (DEPOTESTOSTERONE CYPIONATE) 200 MG/ML  injection Inject 1 mL into the muscle every 14 (fourteen) days.    [provider]    Allergies: Patient has no known allergies.    Review of Systems  Updated Vital Signs BP (!) 141/82   Pulse 100   Temp (!) 100.6 F (38.1 C) (Rectal)   Resp 19   Ht 1.753 m (5' 9)   Wt 68 kg   SpO2 98%   BMI 22.15 kg/m   Physical Exam Vitals reviewed.  Constitutional:      Appearance: Normal appearance.  HENT:     Head: Normocephalic.     Right Ear: External ear normal.     Left Ear: External ear normal.     Nose: Nose normal.     Mouth/Throat:     Pharynx: Oropharynx is clear.  Eyes:     Extraocular Movements: Extraocular movements intact.     Pupils:  Pupils are equal, round, and reactive to light.  Cardiovascular:     Rate and Rhythm: Tachycardia present. Rhythm irregular.     Pulses: Normal pulses.     Heart sounds: Normal heart sounds.  Pulmonary:     Effort: Pulmonary effort is normal.     Breath sounds: Normal breath sounds.  Abdominal:     General: Abdomen is flat.     Palpations: Abdomen is soft.  Musculoskeletal:        General: Normal range of motion.     Cervical back: Normal range of motion.  Skin:    General: Skin is warm and dry.     Capillary Refill: Capillary refill takes less than 2 seconds.  Neurological:     General: No focal deficit present.     Mental Status: He is alert.     Comments: Patient was seen with generalized weakness appears to be symmetrical     (all labs ordered are listed, but only abnormal results are displayed) Labs Reviewed  CBC - Abnormal; Notable for the following components:      Result Value   Platelets 116 (*)    All other components within normal limits  BASIC METABOLIC PANEL WITH GFR - Abnormal; Notable for the following components:   Glucose, Bld 131 (*)    Calcium  7.8 (*)    GFR, Estimated 60 (*)    All other components within normal limits  I-STAT CG4 LACTIC ACID, ED - Abnormal; Notable for the following components:   Lactic Acid, Venous 3.3 (*)    All other components within normal limits  I-STAT CG4 LACTIC ACID, ED - Abnormal; Notable for the following components:   Lactic Acid, Venous 2.1 (*)    All other components within normal limits  I-STAT CHEM 8, ED - Abnormal; Notable for the following components:   Glucose, Bld 132 (*)    Calcium , Ion 1.04 (*)    All other components within normal limits  CULTURE, BLOOD (ROUTINE X 2)  CULTURE, BLOOD (ROUTINE X 2)  URINALYSIS, ROUTINE W REFLEX MICROSCOPIC    EKG: EKG Interpretation Date/Time:  Monday October 17 2024 09:42:57 EST Ventricular Rate:  121 PR Interval:    QRS Duration:  85 QT Interval:  313 QTC  Calculation: 444 R Axis:   -10  Text Interpretation: Atrial fibrillation Ventricular premature complex Confirmed by Levander Houston [phone]) on 10/17/2024 9:48:35 AM  Radiology: [facility] Chest Port 1 View Result Date: 10/17/2024 EXAM: 1 VIEW(S) XRAY OF THE CHEST 10/17/2024 10:11:00 AM COMPARISON: 12/11/2023 CLINICAL HISTORY: weakness FINDINGS: LUNGS AND PLEURA: No focal  pulmonary opacity. No pleural effusion. No pneumothorax. HEART AND MEDIASTINUM: No acute abnormality of the cardiac and mediastinal silhouettes. BONES AND SOFT TISSUES: No acute osseous abnormality. IMPRESSION: 1. No acute process. Electronically signed by: Evalene Coho MD 10/17/2024 10:49 AM EST RP Workstation: [id number]   CT Head Wo Contrast Result Date: 10/17/2024 EXAM: CT HEAD WITHOUT CONTRAST 10/17/2024 10:15:25 AM TECHNIQUE: CT of the head was performed without the administration of intravenous contrast. Automated exposure control, iterative reconstruction, and/or weight based adjustment of the mA/kV was utilized to reduce the radiation dose to as low as reasonably achievable. COMPARISON: CT of the head dated 12/31/2013. CLINICAL HISTORY: Mental status change, unknown cause. FINDINGS: BRAIN AND VENTRICLES: No acute hemorrhage. No evidence of acute infarct. No hydrocephalus. No extra-axial collection. No mass effect or midline shift. Age-related atrophy. Mild periventricular and deep white matter hypodensity typical of chronic small vessel ischemia. ORBITS: No acute abnormality. Bilateral cataract resection noted. SINUSES: Near complete opacification of right frontal sinus. Opacification of right ethmoid air cells. Frothy debris in right side of sphenoid sinus. SOFT TISSUES AND SKULL: No acute soft tissue abnormality. No skull fracture. Atherosclerosis of skullbase vasculature without hyperdense vessel or abnormal calcification. IMPRESSION: 1. No acute intracranial abnormality. 2. Near complete opacification of right frontal sinus,  opacification of right ethmoid air cells, and frothy debris in right side of sphenoid sinus. Electronically signed by: Evalene Coho MD 10/17/2024 10:49 AM EST RP Workstation: [id number]     .Critical Care  Performed by: Levander Houston, MD Authorized by: Levander Houston, MD   Critical care provider statement:    Critical care time (minutes):  60   Critical care end time:  10/17/2024 1:17 PM   Critical care time was exclusive of:  Separately billable procedures and treating other patients and teaching time   Critical care was necessary to treat or prevent imminent or life-threatening deterioration of the following conditions:  Sepsis   Critical care was time spent personally by me on the following activities:  Development of treatment plan with patient or surrogate, discussions with consultants, evaluation of patient's response to treatment, examination of patient, ordering and review of laboratory studies, ordering and review of radiographic studies, ordering and performing treatments and interventions, pulse oximetry, re-evaluation of patient's condition and review of old charts    Medications Ordered in the ED  lactated ringers  infusion (has no administration in time range)  lactated ringers  bolus 500 mL (500 mLs Intravenous New Bag/Given 10/17/24 1255)  ceFEPIme  (MAXIPIME ) 2 g in sodium chloride  0.9 % 100 mL IVPB (2 g Intravenous New Bag/Given 10/17/24 1256)  metroNIDAZOLE  (FLAGYL ) IVPB 500 mg (has no administration in time range)  vancomycin  (VANCOREADY) IVPB 1500 mg/300 mL (has no administration in time range)  lactated ringers  bolus 500 mL (0 mLs Intravenous Stopped 10/17/24 1115)    Clinical Course as of 10/17/24 1318  Mon Oct 17, 2024  1050 CBC reviewed and interpreted and within normal limits with the exception of thrombocytopenia with platelets 116,000 decreased from first prior of 208,000 4 months ago [DR]    Clinical Course User Index [DR] Levander Houston, MD                                  Medical Decision Making Amount and/or Complexity of Data Reviewed Labs: ordered. Radiology: ordered.  Risk Prescription drug management.   83 year old man history of Crohn's, paroxysmal atrial fibrillation, high blood pressure, hyperlipidemia, presents today  with generalized weakness.  Here in the ED he is found to have a temperature of 100.6, he was tachycardic to 140s with A-fib, blood pressure 133-149/75-101. Patient is awake and alert but has generalized weakness Patient has irregular tachycardic heartbeat Lungs are clear to auscultation Patient's symptoms most consistent with sepsis with slightly elevated lactic and fever. No abdominal pain or diarrhea has been noted. Has had some problems with urination and urinalysis is currently pending. He has not had any URI symptoms, sore throat, or cough. White blood cell count is normal Patient is receiving IV fluids.  He received 500 cc of IV fluids prior to fever and return of lactic acid.  Heart rate decreased from 142 100s. Lactic acid was returned and fever found on rectal temperature.  At that time, additional IV fluids were added a 500 cc and broad-spectrum antibiotics initiated Lacic acid decreasing from 3.3. to 2.1 Care discussed with wife and family at bedside Plan admission for further evaluation and treatment Care discussed with Dr. Seena who will see for admission    Final diagnoses:  Sepsis, due to unspecified organism, unspecified whether acute organ dysfunction present [facility])    ED Discharge Orders     None          Levander Houston, MD 10/17/24 1318

## 2024-10-18 LAB — CBC
HCT: 40.3 % (ref 39.0–52.0)
Hemoglobin: 13.8 g/dL (ref 13.0–17.0)
MCH: 32.1 pg (ref 26.0–34.0)
MCHC: 34.2 g/dL (ref 30.0–36.0)
MCV: 93.7 fL (ref 80.0–100.0)
Platelets: 95 K/uL — ABNORMAL LOW (ref 150–400)
RBC: 4.3 MIL/uL (ref 4.22–5.81)
RDW: 14.5 % (ref 11.5–15.5)
WBC: 7.9 K/uL (ref 4.0–10.5)
nRBC: 0 % (ref 0.0–0.2)

## 2024-10-18 LAB — COMPREHENSIVE METABOLIC PANEL WITH GFR
ALT: 37 U/L (ref 0–44)
AST: 109 U/L — ABNORMAL HIGH (ref 15–41)
Albumin: 2.6 g/dL — ABNORMAL LOW (ref 3.5–5.0)
Alkaline Phosphatase: 67 U/L (ref 38–126)
Anion gap: 10 (ref 5–15)
BUN: 14 mg/dL (ref 8–23)
CO2: 19 mmol/L — ABNORMAL LOW (ref 22–32)
Calcium: 8 mg/dL — ABNORMAL LOW (ref 8.9–10.3)
Chloride: 108 mmol/L (ref 98–111)
Creatinine, Ser: 1.08 mg/dL (ref 0.61–1.24)
GFR, Estimated: >60 mL/min (ref >60)
Glucose, Bld: 110 mg/dL — ABNORMAL HIGH (ref 70–99)
Potassium: 3.8 mmol/L (ref 3.5–5.1)
Sodium: 137 mmol/L (ref 135–145)
Total Bilirubin: 1.1 mg/dL (ref 0.0–1.2)
Total Protein: 5.2 g/dL — ABNORMAL LOW (ref 6.5–8.1)

## 2024-10-18 LAB — TSH: TSH: 3.483 u[IU]/mL (ref 0.350–4.500)

## 2024-10-18 LAB — PROCALCITONIN: Procalcitonin: 0.28 ng/mL

## 2024-10-18 NOTE — Plan of Care (Signed)
  Problem: Education: Goal: Knowledge of General Education information will improve Description: Including pain rating scale, medication(s)/side effects and non-pharmacologic comfort measures Outcome: Progressing   Problem: Nutrition: Goal: Adequate nutrition will be maintained Outcome: Progressing   Problem: Pain Managment: Goal: General experience of comfort will improve and/or be controlled Outcome: Progressing

## 2024-10-18 NOTE — Evaluation (Signed)
 Physical Therapy Evaluation Patient Details Name: Nicholas [name] MRN: [id number] DOB: [birth year] Today's Date: 10/18/2024  History of Present Illness  [name] adm 12/8 s/p fall with weakness. Pt's workup for SIRS (fever, tachycardia low BP) and afib with RVR . PMH HTN HLD CKDIIIa Hypothryoidism paroxysmal atrial fibrillation PAD dementia OSA Crohn's disease s/p partial colectomy chronic pancreatitis cataract, sleep apnea on CPAP, remote smoker.  Clinical Impression  Patient presents with decreased mobility due to generalized weakness, decreased balance and decreased activity tolerance.  Currently needing close S to CGA for mobility in the hallway and for stair negotiation.  Previously pt independent with mobility and still was using a lawn tractor to mow the grass.  Patient will benefit from skilled PT in the acute setting to allow d/c home with wife assist.       If plan is discharge home, recommend the following: A little help with bathing/dressing/bathroom;A little help with walking and/or transfers   Can travel by private vehicle        Equipment Recommendations None recommended by PT  Recommendations for Other Services       Functional Status Assessment Patient has had a recent decline in their functional status and demonstrates the ability to make significant improvements in function in a reasonable and predictable amount of time.     Precautions / Restrictions Precautions Precautions: Fall Recall of Precautions/Restrictions: Intact      Mobility  Bed Mobility Overal bed mobility: Modified Independent             General bed mobility comments: sit to supine unaided    Transfers Overall transfer level: Needs assistance   Transfers: Sit to/from Stand Sit to Stand: Supervision           General transfer comment: from straight back chair in the room    Ambulation/Gait Ambulation/Gait assistance: Supervision Gait Distance (Feet): 350 Feet Assistive device:  None Gait Pattern/deviations: Step-through pattern, Decreased stride length       General Gait Details: mild shorter stride length and needing supervision for safety  Stairs Stairs: Yes Stairs assistance: Contact guard assist Stair Management: One rail Right, Alternating pattern, Forwards Number of Stairs: 3 General stair comments: assist for safety esp turning around on stairs with IV lines  Wheelchair Mobility     Tilt Bed    Modified Rankin (Stroke Patients Only)       Balance Overall balance assessment: Needs assistance   Sitting balance-Leahy Scale: Good       Standing balance-Leahy Scale: Good                               Pertinent Vitals/Pain Pain Assessment Pain Assessment: No/denies pain    Home Living Family/patient expects to be discharged to:: Private residence Living Arrangements: Spouse/significant other Available Help at Discharge: Family;Available 24 hours/day Type of Home: House Home Access: Stairs to enter Entrance Stairs-Rails: Left;Right;Can reach both Entrance Stairs-Number of Steps: 8   Home Layout: Two level;Able to live on main level with bedroom/bathroom Home Equipment: None      Prior Function Prior Level of Function : Independent/Modified Independent             Mobility Comments: reports recent weakness and almost fall when returning from getting mail and sped up caught himself on rail for stairs to enter home; wife reports recent weakness and slipped from bed       Extremity/Trunk Assessment  Upper Extremity Assessment Upper Extremity Assessment: Generalized weakness    Lower Extremity Assessment Lower Extremity Assessment: Generalized weakness    Cervical / Trunk Assessment Cervical / Trunk Assessment: Normal  Communication   Communication Communication: No apparent difficulties    Cognition Arousal: Alert Behavior During Therapy: WFL for tasks assessed/performed   PT - Cognitive  impairments: History of cognitive impairments, Memory                       PT - Cognition Comments: admits to memory issues Following commands: Intact       Cueing Cueing Techniques: Verbal cues     General Comments      Exercises     Assessment/Plan    PT Assessment Patient needs continued PT services  PT Problem List Decreased balance;Decreased mobility;Decreased strength       PT Treatment Interventions DME instruction;Gait training;Stair training;Patient/family education;Functional mobility training;Therapeutic activities;Therapeutic exercise;Balance training    PT Goals (Current goals can be found in the Care Plan section)  Acute Rehab PT Goals Patient Stated Goal: return to independent PT Goal Formulation: With patient/family Time For Goal Achievement: 11/01/24 Potential to Achieve Goals: Good    Frequency Min 2X/week     Co-evaluation               AM-PAC PT 6 Clicks Mobility  Outcome Measure Help needed turning from your back to your side while in a flat bed without using bedrails?: None Help needed moving from lying on your back to sitting on the side of a flat bed without using bedrails?: None Help needed moving to and from a bed to a chair (including a wheelchair)?: A Little Help needed standing up from a chair using your arms (e.g., wheelchair or bedside chair)?: A Little Help needed to walk in hospital room?: A Little Help needed climbing 3-5 steps with a railing? : A Little 6 Click Score: 20    End of Session Equipment Utilized During Treatment: Gait belt Activity Tolerance: Patient tolerated treatment well Patient left: in bed;with call bell/phone within reach;with family/visitor present;with bed alarm set   PT Visit Diagnosis: Muscle weakness (generalized) (M62.81);Other abnormalities of gait and mobility (R26.89)    Time: 8474-8396 PT Time Calculation (min) (ACUTE ONLY): 38 min   Charges:   PT Evaluation $PT Eval Moderate  Complexity: 1 Mod PT Treatments $Gait Training: 8-22 mins PT General Charges $$ ACUTE PT VISIT: 1 Visit         Micheline Portal, PT Acute Rehabilitation Services Office:[phone] 10/18/2024   Montie Portal 10/18/2024, 4:47 PM

## 2024-10-18 NOTE — Plan of Care (Signed)

## 2024-10-18 NOTE — Progress Notes (Signed)
 PROGRESS NOTE    Nicholas [name]  FMW:[id number] DOB: 08/12/1941 DOA: 10/17/2024 PCP: Yolande Toribio MATSU, MD    Brief Narrative:  83 year old with history of hypertension, hyperlipidemia, CKD stage IIIa, hypothyroidism, paroxysmal A-fib currently not on anticoagulation, dementia, Crohn's disease status post partial colectomy and currently on Imuran  presented with about 1 day of extreme weakness, he was not able to get out of bed.  EMS found him with A-fib with a heart rate of 160 and blood pressure 80 systolic.  300 cc fluid bolus was given with improvement.  Patient without any recent fever or other symptomatology. In the emergency room temperature 100.6, blood pressure normal, heart rate 80-1 20 and sinus rhythm.  Electrolytes adequate.  Lactic acid 3.3 that improved with IV fluids.  Chest x-ray, CT head without any abnormality.  Started on IV fluids and broad-spectrum antibiotics and admitted to the hospital.  Subjective: Patient seen and examined.  Poor historian.  He tells me he does not remember why the hospital.  Remains afebrile overnight.  Patient tells me that he feels he is back to himself.  Denies any nausea vomiting. Called and discussed with patient's wife.  She only reports that he became suddenly extremely weak but no other symptoms.  Assessment & Plan:   SIRS, no definite source of acute infection.  Immunocompromised on azathioprine . Clinically improved.  Lactic acid and blood pressure responded to IV fluid boluses. COVID, flu and RSV negative. Urinalysis was normal. Blood cultures, negative so far. Given severity of symptoms, will continue antibiotics including vancomycin , cefepime  today.  Can discontinue Flagyl .  Does not have any evidence of meningitis.  We discussed Mobilize. Check procalcitonin.  This could be viral syndrome.  Essential hypertension: Blood pressures stable.  Hold all antihypertensives.  Paroxysmal A-fib with RVR: Currently sinus rhythm.  Not on  anticoagulation.  Chronic medical issues including Hyperlipidemia, continue statin CKD stage IIIa, at about baseline PAD, on aspirin  and statin Dementia, on Namenda  Chron's disease,  on azathioprine .  Holding due to acute infection.    DVT prophylaxis: enoxaparin  (LOVENOX ) injection 40 mg Start: 10/17/24 2200   Code Status: Full code Family Communication: Wife on the phone Disposition Plan: Status is: Inpatient Remains inpatient appropriate because: Significant symptoms, IV antibiotics     Consultants:  None  Procedures:  None  Antimicrobials:  Vancomycin , cefepime  and Flagyl  12/8 ---     Objective: Vitals:   10/17/24 2008 10/17/24 2326 10/18/24 0303 10/18/24 0857  BP: 110/72  131/64 127/68  Pulse: 87  78 69  Resp: 12 14 16 20   Temp: (!) 97.5 F (36.4 C)  99.7 F (37.6 C) 97.7 F (36.5 C)  TempSrc: Oral  Oral Oral  SpO2: 98%  99% 99%  Weight:      Height:        Intake/Output Summary (Last 24 hours) at 10/18/2024 1039 Last data filed at 10/17/2024 2100 Gross per 24 hour  Intake 100.03 ml  Output 800 ml  Net -699.97 ml   Filed Weights   10/17/24 0937 10/17/24 1812  Weight: 68 kg 71.7 kg    Examination:  General exam: Appears calm and comfortable.  Frail. Neck is supple. Respiratory system: Clear to auscultation. Respiratory effort normal.  No added sounds. Cardiovascular system: S1 & S2 heard, RRR. No JVD, murmurs, rubs, gallops or clicks. No pedal edema. Gastrointestinal system: Abdomen is nondistended, soft and nontender. No organomegaly or masses felt. Normal bowel sounds heard. Central nervous system: Alert and awake.  Mostly oriented.  Forgetful.  No focal neurological deficits. Extremities: Symmetric 5 x 5 power.    Data Reviewed: I have personally reviewed following labs and imaging studies  CBC: Recent Labs  Lab 10/17/24 0945 10/17/24 1026 10/18/24 0337  WBC 9.2  --  7.9  HGB 15.0 15.3 13.8  HCT 44.9 45.0 40.3  MCV 96.6  --  93.7   PLT 116*  --  95*   Basic Metabolic Panel: Recent Labs  Lab 10/17/24 0945 10/17/24 1026 10/18/24 0337  NA 136 139 137  K 3.8 3.9 3.8  CL 104 105 108  CO2 22  --  19*  GLUCOSE 131* 132* 110*  BUN 13 15 14   CREATININE 1.19 1.10 1.08  CALCIUM  7.8*  --  8.0*   GFR: Estimated Creatinine Clearance: 50 mL/min (by C-G formula based on SCr of 1.08 mg/dL). Liver Function Tests: Recent Labs  Lab 10/18/24 0337  AST 109*  ALT 37  ALKPHOS 67  BILITOT 1.1  PROT 5.2*  ALBUMIN  2.6*   No results for input(s): LIPASE, AMYLASE in the last 168 hours. No results for input(s): AMMONIA in the last 168 hours. Coagulation Profile: No results for input(s): INR, PROTIME in the last 168 hours. Cardiac Enzymes: No results for input(s): CKTOTAL, CKMB, CKMBINDEX, TROPONINI in the last 168 hours. BNP (last 3 results) No results for input(s): PROBNP in the last 8760 hours. HbA1C: No results for input(s): HGBA1C in the last 72 hours. CBG: No results for input(s): GLUCAP in the last 168 hours. Lipid Profile: No results for input(s): CHOL, HDL, LDLCALC, TRIG, CHOLHDL, LDLDIRECT in the last 72 hours. Thyroid  Function Tests: No results for input(s): TSH, T4TOTAL, FREET4, T3FREE, THYROIDAB in the last 72 hours. Anemia Panel: No results for input(s): VITAMINB12, FOLATE, FERRITIN, TIBC, IRON, RETICCTPCT in the last 72 hours. Sepsis Labs: Recent Labs  Lab 10/17/24 1126 10/17/24 1201  LATICACIDVEN 3.3* 2.1*    Recent Results (from the past 240 hours)  Blood culture (routine x 2)     Status: None (Preliminary result)   Collection Time: 10/17/24 11:55 AM   Specimen: BLOOD  Result Value Ref Range Status   Specimen Description BLOOD RIGHT ANTECUBITAL  Final   Special Requests   Final    BOTTLES DRAWN AEROBIC AND ANAEROBIC Blood Culture adequate volume   Culture   Final    NO GROWTH < 24 HOURS Performed at [facility], [address] [place], [place] [address]    Report Status PENDING  Incomplete  Blood culture (routine x 2)     Status: None (Preliminary result)   Collection Time: 10/17/24 12:15 PM   Specimen: BLOOD RIGHT HAND  Result Value Ref Range Status   Specimen Description BLOOD RIGHT HAND  Final   Special Requests   Final    BOTTLES DRAWN AEROBIC ONLY Blood Culture results may not be optimal due to an inadequate volume of blood received in culture bottles   Culture   Final    NO GROWTH < 24 HOURS Performed at [facility], [address], [place], [place] [address]    Report Status PENDING  Incomplete  Resp panel by RT-PCR (RSV, Flu A&B, Covid) Anterior Nasal Swab     Status: None   Collection Time: 10/17/24  1:11 PM   Specimen: Anterior Nasal Swab  Result Value Ref Range Status   SARS Coronavirus 2 by RT PCR NEGATIVE NEGATIVE Final   Influenza A by PCR NEGATIVE NEGATIVE Final   Influenza  B by PCR NEGATIVE NEGATIVE Final    Comment: (NOTE) The Xpert Xpress SARS-CoV-2/FLU/RSV plus assay is intended as an aid in the diagnosis of influenza from Nasopharyngeal swab specimens and should not be used as a sole basis for treatment. Nasal washings and aspirates are unacceptable for Xpert Xpress SARS-CoV-2/FLU/RSV testing.  Fact Sheet for Patients: bloggercourse.com  Fact Sheet for Healthcare Providers: seriousbroker.it  This test is not yet approved or cleared by the [place]  FDA and has been authorized for detection and/or diagnosis of SARS-CoV-2 by FDA under an Emergency Use Authorization (EUA). This EUA will remain in effect (meaning this test can be used) for the duration of the COVID-19 declaration under Section 564(b)(1) of the Act, 21 U.S.C. section 360bbb-3(b)(1), unless the authorization is terminated or revoked.     Resp Syncytial Virus by PCR NEGATIVE NEGATIVE Final    Comment: (NOTE) Fact Sheet for  Patients: bloggercourse.com  Fact Sheet for Healthcare Providers: seriousbroker.it  This test is not yet approved or cleared by the [place]  FDA and has been authorized for detection and/or diagnosis of SARS-CoV-2 by FDA under an Emergency Use Authorization (EUA). This EUA will remain in effect (meaning this test can be used) for the duration of the COVID-19 declaration under Section 564(b)(1) of the Act, 21 U.S.C. section 360bbb-3(b)(1), unless the authorization is terminated or revoked.  Performed at [facility], [address], [place], [place] [address]          Radiology Studies: DG Chest Port 1 View Result Date: 10/17/2024 EXAM: 1 VIEW(S) XRAY OF THE CHEST 10/17/2024 10:11:00 AM COMPARISON: 12/11/2023 CLINICAL HISTORY: weakness FINDINGS: LUNGS AND PLEURA: No focal pulmonary opacity. No pleural effusion. No pneumothorax. HEART AND MEDIASTINUM: No acute abnormality of the cardiac and mediastinal silhouettes. BONES AND SOFT TISSUES: No acute osseous abnormality. IMPRESSION: 1. No acute process. Electronically signed by: Evalene Coho MD 10/17/2024 10:49 AM EST RP Workstation: [id number]   CT Head Wo Contrast Result Date: 10/17/2024 EXAM: CT HEAD WITHOUT CONTRAST 10/17/2024 10:15:25 AM TECHNIQUE: CT of the head was performed without the administration of intravenous contrast. Automated exposure control, iterative reconstruction, and/or weight based adjustment of the mA/kV was utilized to reduce the radiation dose to as low as reasonably achievable. COMPARISON: CT of the head dated 12/31/2013. CLINICAL HISTORY: Mental status change, unknown cause. FINDINGS: BRAIN AND VENTRICLES: No acute hemorrhage. No evidence of acute infarct. No hydrocephalus. No extra-axial collection. No mass effect or midline shift. Age-related atrophy. Mild periventricular and deep white matter hypodensity typical of chronic small vessel ischemia.  ORBITS: No acute abnormality. Bilateral cataract resection noted. SINUSES: Near complete opacification of right frontal sinus. Opacification of right ethmoid air cells. Frothy debris in right side of sphenoid sinus. SOFT TISSUES AND SKULL: No acute soft tissue abnormality. No skull fracture. Atherosclerosis of skullbase vasculature without hyperdense vessel or abnormal calcification. IMPRESSION: 1. No acute intracranial abnormality. 2. Near complete opacification of right frontal sinus, opacification of right ethmoid air cells, and frothy debris in right side of sphenoid sinus. Electronically signed by: Evalene Coho MD 10/17/2024 10:49 AM EST RP Workstation: [id number]        Scheduled Meds:  aspirin   81 mg Oral Daily   enoxaparin  (LOVENOX ) injection  40 mg Subcutaneous Q24H   levothyroxine   75 mcg Oral Q0600   memantine   5 mg Oral Daily   rosuvastatin   5 mg Oral Daily   sodium chloride  flush  3 mL Intravenous Q12H   Continuous Infusions:  ceFEPime  (MAXIPIME )  IV 2 g (10/18/24 0028)   metronidazole  500 mg (10/17/24 2124)   vancomycin        LOS: 1 day     Renato Applebaum, MD [facility]

## 2024-10-18 NOTE — TOC Initial Note (Signed)
 Transition of Care [facility]) - Initial/Assessment Note    Patient Details  Name: Nicholas [name] MRN: [id number] Date of Birth: 05-25-1941  [facility]) CM/SW Contact:    Isaiah Public, LCSWA Phone Number: 10/18/2024, 2:08 PM  Clinical Narrative:                    Patient is from Home. Patient presents with weakness and fall. PT/OT consulted. ICM awaiting PT/OT recommendations. ICM will continue to follow and assist with patients dc planning needs.          Patient Goals and CMS Choice            Expected Discharge Plan and Services                                              Prior Living Arrangements/Services                       Activities of Daily Living   ADL Screening (condition at time of admission) Independently performs ADLs?: No Does the patient have a NEW difficulty with bathing/dressing/toileting/self-feeding that is expected to last >3 days?: No Does the patient have a NEW difficulty with getting in/out of bed, walking, or climbing stairs that is expected to last >3 days?: No Does the patient have a NEW difficulty with communication that is expected to last >3 days?: No Is the patient deaf or have difficulty hearing?: No Does the patient have difficulty seeing, even when wearing glasses/contacts?: No Does the patient have difficulty concentrating, remembering, or making decisions?: Yes  Permission Sought/Granted                  Emotional Assessment              Admission diagnosis:  SIRS (systemic inflammatory response syndrome) (HCC) [R65.10] Sepsis, due to unspecified organism, unspecified whether acute organ dysfunction present [facility]) [A41.9] Patient Active Problem List   Diagnosis Date Noted   S/P partial colectomy (Crohn's) 10/17/2024   SIRS (systemic inflammatory response syndrome) (HCC) 10/17/2024   Dizziness 04/26/2024   Ear itch 04/26/2024   Abnormal weight loss 03/16/2024   Chronic kidney  disease, stage 3a (HCC) 03/16/2024   Daytime somnolence 03/16/2024   Herpes zoster without complication 03/16/2024   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   Memory loss 03/16/2024   Obstructive sleep apnea 03/16/2024   Other long term (current) drug therapy 03/16/2024   PAD (peripheral artery disease) 03/16/2024   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   Common bile duct dilation 06/13/2021   Essential hypertension 04/16/2020   Paronychia of left index finger 02/03/2020   Cellulitis of finger of left hand 01/11/2020   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   Insomnia 10/03/2019   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   Encounter for screening for other disorder 09/21/2018   Pain in right knee 09/21/2018   Right knee pain 09/21/2018   Encounter for general adult medical examination without abnormal findings 09/14/2018   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   Ganglion cyst of dorsum of left wrist 07/14/2018   Ganglion of right wrist 07/01/2018   Dementia (HCC) 09/14/2017   Benign paroxysmal positional vertigo 05/28/2017   Bilateral hearing loss 05/28/2017  Disequilibrium 07/16/2016   Hemorrhage of rectum and anus 04/21/2016   Impaired fasting glucose 09/04/2014   Vitamin D deficiency 09/04/2014   Impacted cerumen of right ear 05/30/2014   Disorder of skin or subcutaneous tissue 02/28/2014   Fatigue 02/28/2014   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   Atrial tachycardia 12/11/2012   Protein calorie malnutrition 05/10/2012   Nonspecific (abnormal) findings on radiological and other examination of gastrointestinal tract 04/10/2012   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) 01/13/2012   Melena 12/03/2011   Hypothyroidism 08/22/2011   Crohn's disease (HCC) 06/18/2011   Chest pain 01/29/2010    Hyperlipidemia 01/29/2010   Pain in left foot 01/29/2010   Paroxysmal atrial fibrillation (HCC) 01/29/2010   Cystitis 01/29/2010   PCP:  Yolande Toribio MATSU, MD Pharmacy:   [facility] [phone] [place], [place] - [address] [facility] [address] [place] [address] Phone: [phone] Fax: [phone]     Social Drivers of Health (SDOH) Social History: SDOH Screenings   Food Insecurity: No Food Insecurity (10/17/2024)  Housing: Low Risk  (10/17/2024)  Transportation Needs: No Transportation Needs (10/17/2024)  Utilities: Not At Risk (10/17/2024)  Social Connections: Moderately Isolated (10/17/2024)  Tobacco Use: Medium Risk (10/17/2024)   SDOH Interventions:     Readmission Risk Interventions     No data to display

## 2024-10-19 DIAGNOSIS — I480 Paroxysmal atrial fibrillation: Secondary | ICD-10-CM | POA: Diagnosis not present

## 2024-10-19 DIAGNOSIS — R6510 Systemic inflammatory response syndrome (SIRS) of non-infectious origin without acute organ dysfunction: Secondary | ICD-10-CM | POA: Diagnosis not present

## 2024-10-19 LAB — COMPREHENSIVE METABOLIC PANEL WITH GFR
ALT: 42 U/L (ref 0–44)
AST: 90 U/L — ABNORMAL HIGH (ref 15–41)
Albumin: 2.6 g/dL — ABNORMAL LOW (ref 3.5–5.0)
Alkaline Phosphatase: 74 U/L (ref 38–126)
Anion gap: 5 (ref 5–15)
BUN: 14 mg/dL (ref 8–23)
CO2: 26 mmol/L (ref 22–32)
Calcium: 8 mg/dL — ABNORMAL LOW (ref 8.9–10.3)
Chloride: 106 mmol/L (ref 98–111)
Creatinine, Ser: 1.31 mg/dL — ABNORMAL HIGH (ref 0.61–1.24)
GFR, Estimated: 53 mL/min — ABNORMAL LOW (ref >60)
Glucose, Bld: 97 mg/dL (ref 70–99)
Potassium: 4.2 mmol/L (ref 3.5–5.1)
Sodium: 137 mmol/L (ref 135–145)
Total Bilirubin: 1 mg/dL (ref 0.0–1.2)
Total Protein: 5.1 g/dL — ABNORMAL LOW (ref 6.5–8.1)

## 2024-10-19 LAB — CBC WITH DIFFERENTIAL/PLATELET
Abs Immature Granulocytes: 0.02 K/uL (ref 0.00–0.07)
Basophils Absolute: 0 K/uL (ref 0.0–0.1)
Basophils Relative: 0 %
Eosinophils Absolute: 0.2 K/uL (ref 0.0–0.5)
Eosinophils Relative: 4 %
HCT: 37.9 % — ABNORMAL LOW (ref 39.0–52.0)
Hemoglobin: 12.8 g/dL — ABNORMAL LOW (ref 13.0–17.0)
Immature Granulocytes: 0 %
Lymphocytes Relative: 3 %
Lymphs Abs: 0.1 K/uL — ABNORMAL LOW (ref 0.7–4.0)
MCH: 32.3 pg (ref 26.0–34.0)
MCHC: 33.8 g/dL (ref 30.0–36.0)
MCV: 95.7 fL (ref 80.0–100.0)
Monocytes Absolute: 0.6 K/uL (ref 0.1–1.0)
Monocytes Relative: 13 %
Neutro Abs: 4 K/uL (ref 1.7–7.7)
Neutrophils Relative %: 80 %
Platelets: 109 K/uL — ABNORMAL LOW (ref 150–400)
RBC: 3.96 MIL/uL — ABNORMAL LOW (ref 4.22–5.81)
RDW: 14.2 % (ref 11.5–15.5)
WBC: 5 K/uL (ref 4.0–10.5)
nRBC: 0 % (ref 0.0–0.2)

## 2024-10-19 LAB — PHOSPHORUS: Phosphorus: 2.4 mg/dL — ABNORMAL LOW (ref 2.5–4.6)

## 2024-10-19 LAB — MAGNESIUM: Magnesium: 2 mg/dL (ref 1.7–2.4)

## 2024-10-19 MED ORDER — AZATHIOPRINE 50 MG PO TABS: 25.0000 mg | ORAL_TABLET | Freq: Every day | ORAL | Status: AC

## 2024-10-19 NOTE — Plan of Care (Signed)
  Problem: Education: Goal: Knowledge of General Education information will improve Description: Including pain rating scale, medication(s)/side effects and non-pharmacologic comfort measures Outcome: Progressing   Problem: Health Behavior/Discharge Planning: Goal: Ability to manage health-related needs will improve Outcome: Progressing   Problem: Clinical Measurements: Goal: Ability to maintain clinical measurements within normal limits will improve Outcome: Progressing Goal: Will remain free from infection Outcome: Progressing Goal: Diagnostic test results will improve Outcome: Progressing Goal: Respiratory complications will improve Outcome: Progressing Goal: Cardiovascular complication will be avoided Outcome: Progressing   Problem: Activity: Goal: Risk for activity intolerance will decrease Outcome: Progressing   Problem: Nutrition: Goal: Adequate nutrition will be maintained Outcome: Progressing   Problem: Coping: Goal: Level of anxiety will decrease Outcome: Progressing   Problem: Elimination: Goal: Will not experience complications related to bowel motility Outcome: Progressing Goal: Will not experience complications related to urinary retention Outcome: Progressing   Problem: Pain Managment: Goal: General experience of comfort will improve and/or be controlled Outcome: Progressing   Problem: Safety: Goal: Ability to remain free from injury will improve Outcome: Progressing   Problem: Skin Integrity: Goal: Risk for impaired skin integrity will decrease Outcome: Progressing   Problem: Education: Goal: Knowledge of disease or condition will improve Outcome: Progressing Goal: Understanding of medication regimen will improve Outcome: Progressing Goal: Individualized Educational Video(s) Outcome: Progressing

## 2024-10-19 NOTE — Discharge Summary (Signed)
 [facility] Phys[facility] Discharge Summary   Patient name: Nicholas [name]  Admit date:     10/17/2024  Discharge date: 10/19/2024  Attending Physician: MELVIN, ALEXANDER B [[id number]]  Discharge Physician: Norval Bar   PCP: Yolande Toribio MATSU, MD  Admitted From: Home   Disposition:  Home  Recommendations for Outpatient Follow-up:  Follow up with PCP in 1-2 weeks Follow up with GI in 1 month Follow up with cardiology in 1 month  Home Health:Yes Equipment/Devices: @ECDMELIST @  Discharge Condition:Stable CODE STATUS:FULL Diet recommendation: Heart Healthy Fluid Restriction: None  Hospital Summary: No notes on file  83 year old with history of hypertension, hyperlipidemia, CKD stage IIIa, hypothyroidism, paroxysmal A-fib currently not on anticoagulation, dementia, Crohn's disease status post partial colectomy and currently on Imuran  presented with about 1 day of extreme weakness, he was not able to get out of bed.  EMS found him with A-fib with a heart rate of 160 and blood pressure 80 systolic.  300 cc fluid bolus was given with improvement.  Patient without any recent fever or other symptomatology. In the emergency room temperature 100.6, blood pressure normal, heart rate 80-1 20 and sinus rhythm.  Electrolytes adequate.  Lactic acid 3.3 that improved with IV fluids.  Chest x-ray, CT head without any abnormality.  Started on IV fluids and broad-spectrum antibiotics and admitted to the hospital. Infectious workup completely unremarkable including CXR, UA, blood cultures. Stopped all antibiotics prior to discharge. Azathioprine  resumed on discharge as infection unlikely. Improved with IV hydration that may suggest presentation likely in the setting of dehydration. Discussed with patient the risks and benefits of long term anticoagulation. As per patient, he continues to have chronic intermittent bloody stools due to his Crohn's. Furthermore, patient seems physically deconditioned at  present and at increased risk of falls. After discussion with patient and family, decision made to hold on initiating anticoagulation. Evaluated by PT and improved markedly in terms of mobility status now stable to go home with family.  - advised patient to follow up with PCP/cardiology for ongoing talks about anticoagulation - patient to follow up with GI as scheduled for Crohn's disease management  Hospital Course by Problem: No notes have been filed under this hospital service. Service: Hospitalist   Discharge Diagnoses:  Active Problems:   Crohn's disease (HCC)   Chronic calcific pancreatitis (HCC)   Chronic kidney disease, stage 3a (HCC)   Dementia (HCC)   Essential hypertension   Hyperlipidemia   Hypothyroidism   Obstructive sleep apnea   PAD (peripheral artery disease)   Paroxysmal atrial fibrillation (HCC)   S/P partial colectomy (Crohn's)   SIRS (systemic inflammatory response syndrome) (HCC)   Discharge Instructions  Discharge Instructions     Diet - low sodium heart healthy   Complete by: As directed    Discharge instructions   Complete by: As directed    Follow up with PCP within one week Follow up with cardiology within 1 month for atrial fibrillation management Follow up with gastroenterology within 1 month for Crohn's disease management   Increase activity slowly   Complete by: As directed       Allergies as of 10/19/2024   No Known Allergies      Medication List     STOP taking these medications    losartan  25 MG tablet Commonly known as: COZAAR        TAKE these medications    aspirin  81 MG chewable tablet Commonly known as: Aspirin  Childrens Chew 1 tablet (81 mg total) by  mouth daily.   azaTHIOprine  50 MG tablet Commonly known as: IMURAN  Take 0.5 tablets (25 mg total) by mouth daily. What changed: how much to take   ergocalciferol 1.25 MG (50000 UT) capsule Commonly known as: VITAMIN D2 Take 50,000 Units by mouth once a week.  Monday's.   ferrous sulfate 325 (65 FE) MG tablet Take 325 mg by mouth daily.   levothyroxine  75 MCG tablet Commonly known as: SYNTHROID  Take 75 mcg by mouth daily before breakfast.   loperamide 2 MG tablet Commonly known as: IMODIUM A-D Take 2 mg by mouth 4 (four) times daily as needed for diarrhea or loose stools.   meclizine 12.5 MG tablet Commonly known as: ANTIVERT Take 12.5 mg by mouth every 8 (eight) hours as needed for dizziness or nausea.   memantine  5 MG tablet Commonly known as: NAMENDA  Take 5 mg by mouth 2 (two) times daily.   MULTI VITAMIN DAILY PO Take 1 tablet by mouth daily.   PreserVision AREDS 2 Caps Take 1 capsule by mouth 2 (two) times daily.   rosuvastatin  5 MG tablet Commonly known as: CRESTOR  Take 1 tablet (5 mg total) by mouth daily.   testosterone  cypionate 200 MG/ML injection Commonly known as: DEPOTESTOSTERONE CYPIONATE Inject 100 mg into the muscle every 21 ( twenty-one) days.               Durable Medical Equipment  (From admission, onward)           Start     Ordered   10/19/24 1541  For home use only DME 4 wheeled rolling walker with seat  Once       Question:  Patient needs a walker to treat with the following condition  Answer:  Generalized weakness   10/19/24 1540            Contact information for follow-up providers     [facility] [employer] Follow up.   Why: Rolling walker to be delivered to the room. Contact information: Address: [address], [place], [place] [address] Phone: [phone]             Contact information for after-discharge care     Home Medical Care     [facility]) .   Service: Home Health Services Why: Physical and Occupational Therapy-office to call with visit times. Contact information: [address] [place]  [address] [phone]                    No Known Allergies  Discharge Exam: Vitals:    10/19/24 0735 10/19/24 1137  BP: (!) 155/79 (!) 148/82  Pulse: 66 62  Resp: 18 18  Temp: 97.7 F (36.5 C) 97.6 F (36.4 C)  SpO2: 94% 96%    Physical Exam  The results of significant diagnostics from this hospitalization (including imaging, microbiology, ancillary and laboratory) are listed below for reference.    Microbiology: Recent Results (from the past 240 hours)  Blood culture (routine x 2)     Status: None (Preliminary result)   Collection Time: 10/17/24 11:55 AM   Specimen: BLOOD  Result Value Ref Range Status   Specimen Description BLOOD RIGHT ANTECUBITAL  Final   Special Requests   Final    BOTTLES DRAWN AEROBIC AND ANAEROBIC Blood Culture adequate volume   Culture   Final    NO GROWTH 2 DAYS Performed at [facility], [address] [place],  [place] [address]    Report Status PENDING  Incomplete  Blood culture (routine x 2)     Status: None (Preliminary result)   Collection Time: 10/17/24 12:15 PM   Specimen: BLOOD RIGHT HAND  Result Value Ref Range Status   Specimen Description BLOOD RIGHT HAND  Final   Special Requests   Final    BOTTLES DRAWN AEROBIC ONLY Blood Culture results may not be optimal due to an inadequate volume of blood received in culture bottles   Culture   Final    NO GROWTH 2 DAYS Performed at [facility], [address] [place], [place] [address]    Report Status PENDING  Incomplete  Resp panel by RT-PCR (RSV, Flu A&B, Covid) Anterior Nasal Swab     Status: None   Collection Time: 10/17/24  1:11 PM   Specimen: Anterior Nasal Swab  Result Value Ref Range Status   SARS Coronavirus 2 by RT PCR NEGATIVE NEGATIVE Final   Influenza A by PCR NEGATIVE NEGATIVE Final   Influenza B by PCR NEGATIVE NEGATIVE Final    Comment: (NOTE) The Xpert Xpress SARS-CoV-2/FLU/RSV plus assay is intended as an aid in the diagnosis of influenza from Nasopharyngeal swab specimens and should not be used as a sole basis for treatment. Nasal washings  and aspirates are unacceptable for Xpert Xpress SARS-CoV-2/FLU/RSV testing.  Fact Sheet for Patients: bloggercourse.com  Fact Sheet for Healthcare Providers: seriousbroker.it  This test is not yet approved or cleared by the [place]  FDA and has been authorized for detection and/or diagnosis of SARS-CoV-2 by FDA under an Emergency Use Authorization (EUA). This EUA will remain in effect (meaning this test can be used) for the duration of the COVID-19 declaration under Section 564(b)(1) of the Act, 21 U.S.C. section 360bbb-3(b)(1), unless the authorization is terminated or revoked.     Resp Syncytial Virus by PCR NEGATIVE NEGATIVE Final    Comment: (NOTE) Fact Sheet for Patients: bloggercourse.com  Fact Sheet for Healthcare Providers: seriousbroker.it  This test is not yet approved or cleared by the [place]  FDA and has been authorized for detection and/or diagnosis of SARS-CoV-2 by FDA under an Emergency Use Authorization (EUA). This EUA will remain in effect (meaning this test can be used) for the duration of the COVID-19 declaration under Section 564(b)(1) of the Act, 21 U.S.C. section 360bbb-3(b)(1), unless the authorization is terminated or revoked.  Performed at [facility], [address] [place], [place] [address]      Labs: ProBNP, BNP (last 5 results) Recent Labs    12/11/23 1500  BNP 96.7   Basic Metabolic Panel: Recent Labs  Lab 10/17/24 0945 10/17/24 1026 10/18/24 0337 10/19/24 0400  NA 136 139 137 137  K 3.8 3.9 3.8 4.2  CL 104 105 108 106  CO2 22  --  19* 26  GLUCOSE 131* 132* 110* 97  BUN 13 15 14 14   CREATININE 1.19 1.10 1.08 1.31*  CALCIUM  7.8*  --  8.0* 8.0*  MG  --   --   --  2.0  PHOS  --   --   --  2.4*   Liver Function Tests: Recent Labs  Lab 10/18/24 0337 10/19/24 0400  AST 109* 90*  ALT 37 42  ALKPHOS 67 74   BILITOT 1.1 1.0  PROT 5.2* 5.1*  ALBUMIN  2.6* 2.6*   No results for input(s): LIPASE, AMYLASE in the last 168 hours. No results for input(s): AMMONIA in the last 168 hours.  CBC: Recent Labs  Lab 10/17/24 0945 10/17/24 1026 10/18/24 0337 10/19/24 0400  WBC 9.2  --  7.9 5.0  NEUTROABS  --   --   --  4.0  HGB 15.0 15.3 13.8 12.8*  HCT 44.9 45.0 40.3 37.9*  MCV 96.6  --  93.7 95.7  PLT 116*  --  95* 109*   Cardiac Enzymes: No results for input(s): CKTOTAL, CKMB, CKMBINDEX, TROPONINI, TROPONINIHS in the last 168 hours. BNP: No results for input(s): BNP in the last 168 hours. CBG: No results for input(s): GLUCAP in the last 168 hours. D-Dimer No results for input(s): DDIMER in the last 72 hours. Hgb A1c No results for input(s): HGBA1C in the last 72 hours. Lipid Profile No results for input(s): CHOL, HDL, LDLCALC, TRIG, CHOLHDL, LDLDIRECT in the last 72 hours. Thyroid  function studies Recent Labs    10/18/24 0337  TSH 3.483   Anemia work up No results for input(s): VITAMINB12, FOLATE, FERRITIN, TIBC, IRON, RETICCTPCT in the last 72 hours. Urinalysis    Component Value Date/Time   COLORURINE YELLOW 10/17/2024 1306   APPEARANCEUR CLEAR 10/17/2024 1306   LABSPEC 1.017 10/17/2024 1306   PHURINE 6.0 10/17/2024 1306   GLUCOSEU NEGATIVE 10/17/2024 1306   HGBUR SMALL (A) 10/17/2024 1306   BILIRUBINUR NEGATIVE 10/17/2024 1306   KETONESUR 5 (A) 10/17/2024 1306   PROTEINUR 30 (A) 10/17/2024 1306   UROBILINOGEN 0.2 02/23/2015 1630   NITRITE NEGATIVE 10/17/2024 1306   LEUKOCYTESUR NEGATIVE 10/17/2024 1306   Sepsis Labs Recent Labs  Lab 10/17/24 0945 10/18/24 0337 10/19/24 0400  PROCALCITON  --  0.28  --   WBC 9.2 7.9 5.0    Procedures/Studies: DG Chest Port 1 View Result Date: 10/17/2024 EXAM: 1 VIEW(S) XRAY OF THE CHEST 10/17/2024 10:11:00 AM COMPARISON: 12/11/2023 CLINICAL HISTORY: weakness FINDINGS: LUNGS AND  PLEURA: No focal pulmonary opacity. No pleural effusion. No pneumothorax. HEART AND MEDIASTINUM: No acute abnormality of the cardiac and mediastinal silhouettes. BONES AND SOFT TISSUES: No acute osseous abnormality. IMPRESSION: 1. No acute process. Electronically signed by: Evalene Coho MD 10/17/2024 10:49 AM EST RP Workstation: [id number]   CT Head Wo Contrast Result Date: 10/17/2024 EXAM: CT HEAD WITHOUT CONTRAST 10/17/2024 10:15:25 AM TECHNIQUE: CT of the head was performed without the administration of intravenous contrast. Automated exposure control, iterative reconstruction, and/or weight based adjustment of the mA/kV was utilized to reduce the radiation dose to as low as reasonably achievable. COMPARISON: CT of the head dated 12/31/2013. CLINICAL HISTORY: Mental status change, unknown cause. FINDINGS: BRAIN AND VENTRICLES: No acute hemorrhage. No evidence of acute infarct. No hydrocephalus. No extra-axial collection. No mass effect or midline shift. Age-related atrophy. Mild periventricular and deep white matter hypodensity typical of chronic small vessel ischemia. ORBITS: No acute abnormality. Bilateral cataract resection noted. SINUSES: Near complete opacification of right frontal sinus. Opacification of right ethmoid air cells. Frothy debris in right side of sphenoid sinus. SOFT TISSUES AND SKULL: No acute soft tissue abnormality. No skull fracture. Atherosclerosis of skullbase vasculature without hyperdense vessel or abnormal calcification. IMPRESSION: 1. No acute intracranial abnormality. 2. Near complete opacification of right frontal sinus, opacification of right ethmoid air cells, and frothy debris in right side of sphenoid sinus. Electronically signed by: Evalene Coho MD 10/17/2024 10:49 AM EST RP Workstation: [id number]    Time coordinating discharge: 35 mins  SIGNED:  Norval Bar, MD [facility] 10/19/24, 4:32 PM

## 2024-10-19 NOTE — Progress Notes (Deleted)
 PROGRESS NOTE    Nicholas [name]  FMW:[id number] DOB: [birth year] DOA: 10/17/2024 PCP: Yolande Toribio MATSU, MD  Subjective:  No acute events overnight. Seen and examined at bedside with family present. Reports feeling better but still feels weak. Reports having chronic bodily pains that haven't worsened this admission. Tolerating oral intake without n/v. Denies constipation.   Hospital Course:  83 year old with history of hypertension, hyperlipidemia, CKD stage IIIa, hypothyroidism, paroxysmal A-fib currently not on anticoagulation, dementia, Crohn's disease status post partial colectomy and currently on Imuran  presented with about 1 day of extreme weakness, he was not able to get out of bed.  EMS found him with A-fib with a heart rate of 160 and blood pressure 80 systolic.  300 cc fluid bolus was given with improvement.  Patient without any recent fever or other symptomatology. In the emergency room temperature 100.6, blood pressure normal, heart rate 80-1 20 and sinus rhythm.  Electrolytes adequate.  Lactic acid 3.3 that improved with IV fluids.  Chest x-ray, CT head without any abnormality.  Started on IV fluids and broad-spectrum antibiotics and admitted to the hospital.   Assessment and Plan:  Systemic inflammatory response - etiology unclear, concern for viral lung or intra-abdominal infection vs dehydration from chronic frequent loose stools from underlying Crohn's disease - unlikely to be meningitis  - CXR unremarkable - CT head unremarkable - viral respiratory panel negative - UA unremarkable - blood cultures x2 NGTD, final read pending - Stop all antibiotics - encourage oral intake and hydration  - monitor clinically  Paroxysmal Afib - Patient has history of this and was following with cardiology - as per chart review, patient was previously advised on his risk of strokes and no anticoagulation was pursued as per patient's wishes - dicussed with patient the risks and benefits  of long term anticoagulation. As per patient, he continues to have chronic intermittent bloody stools due to his Crohn's. Furthermore, patient seems physically deconditioned at present and at increased risk of falls. - after discussion with patient and family, decision made to hold on initiating anticoagulation  - advised patient to follow up with PCP/cardiology for ongoing talks about anticoagulation  Crohn's disease - resume azathioprine  on discharge - follow up with outpatient GI as scheduled in Jan 2026   Hypothyroidism - cont home levothyroxine  - TSH within normal limits  HTN - hold home losartan  - resume if BP gets elevated  HLD - cont home statin  PAD - cont home ASA and statin  Dementia - cont memantine   Acute debilitation - PT following, likely SNF  DVT prophylaxis:   SCDs   Code Status: Full Code Family Communication: updated wife and daughters at bedside Disposition Plan: SNF Reason for continuing need for hospitalization: medically ready today, needs SNF  Objective: Vitals:   10/18/24 1934 10/19/24 0400 10/19/24 0735 10/19/24 1137  BP: 125/63 (!) 141/74 (!) 155/79 (!) 148/82  Pulse: 62 64 66 62  Resp: 18 19 18 18   Temp: 97.9 F (36.6 C) 98.1 F (36.7 C) 97.7 F (36.5 C) 97.6 F (36.4 C)  TempSrc: Oral Oral Oral Oral  SpO2: 99%  94% 96%  Weight:      Height:        Intake/Output Summary (Last 24 hours) at 10/19/2024 1248 Last data filed at 10/18/2024 1720 Gross per 24 hour  Intake 706.06 ml  Output --  Net 706.06 ml   Filed Weights   10/17/24 0937 10/17/24 1812  Weight: 68 kg 71.7 kg  Examination:  Physical Exam Vitals and nursing note reviewed.  Constitutional:      General: He is not in acute distress.    Appearance: He is ill-appearing.     Comments: Weak, frail  HENT:     Head: Normocephalic and atraumatic.  Cardiovascular:     Rate and Rhythm: Normal rate and regular rhythm.     Pulses: Normal pulses.     Heart sounds:  Normal heart sounds.  Pulmonary:     Effort: Pulmonary effort is normal.     Breath sounds: Normal breath sounds.  Abdominal:     General: Bowel sounds are normal.     Palpations: Abdomen is soft.  Neurological:     Mental Status: He is alert.     Data Reviewed: I have personally reviewed following labs and imaging studies  CBC: Recent Labs  Lab 10/17/24 0945 10/17/24 1026 10/18/24 0337 10/19/24 0400  WBC 9.2  --  7.9 5.0  NEUTROABS  --   --   --  4.0  HGB 15.0 15.3 13.8 12.8*  HCT 44.9 45.0 40.3 37.9*  MCV 96.6  --  93.7 95.7  PLT 116*  --  95* 109*   Basic Metabolic Panel: Recent Labs  Lab 10/17/24 0945 10/17/24 1026 10/18/24 0337 10/19/24 0400  NA 136 139 137 137  K 3.8 3.9 3.8 4.2  CL 104 105 108 106  CO2 22  --  19* 26  GLUCOSE 131* 132* 110* 97  BUN 13 15 14 14   CREATININE 1.19 1.10 1.08 1.31*  CALCIUM  7.8*  --  8.0* 8.0*  MG  --   --   --  2.0  PHOS  --   --   --  2.4*   GFR: Estimated Creatinine Clearance: 41.2 mL/min (A) (by C-G formula based on SCr of 1.31 mg/dL (H)). Liver Function Tests: Recent Labs  Lab 10/18/24 0337 10/19/24 0400  AST 109* 90*  ALT 37 42  ALKPHOS 67 74  BILITOT 1.1 1.0  PROT 5.2* 5.1*  ALBUMIN  2.6* 2.6*   No results for input(s): LIPASE, AMYLASE in the last 168 hours. No results for input(s): AMMONIA in the last 168 hours. Coagulation Profile: No results for input(s): INR, PROTIME in the last 168 hours. Cardiac Enzymes: No results for input(s): CKTOTAL, CKMB, CKMBINDEX, TROPONINI in the last 168 hours. ProBNP, BNP (last 5 results) Recent Labs    12/11/23 1500  BNP 96.7   HbA1C: No results for input(s): HGBA1C in the last 72 hours. CBG: No results for input(s): GLUCAP in the last 168 hours. Lipid Profile: No results for input(s): CHOL, HDL, LDLCALC, TRIG, CHOLHDL, LDLDIRECT in the last 72 hours. Thyroid  Function Tests: Recent Labs    10/18/24 0337  TSH 3.483   Anemia  Panel: No results for input(s): VITAMINB12, FOLATE, FERRITIN, TIBC, IRON, RETICCTPCT in the last 72 hours. Sepsis Labs: Recent Labs  Lab 10/17/24 1126 10/17/24 1201 10/18/24 0337  PROCALCITON  --   --  0.28  LATICACIDVEN 3.3* 2.1*  --     Recent Results (from the past 240 hours)  Blood culture (routine x 2)     Status: None (Preliminary result)   Collection Time: 10/17/24 11:55 AM   Specimen: BLOOD  Result Value Ref Range Status   Specimen Description BLOOD RIGHT ANTECUBITAL  Final   Special Requests   Final    BOTTLES DRAWN AEROBIC AND ANAEROBIC Blood Culture adequate volume   Culture   Final    NO GROWTH  2 DAYS Performed at [facility], [address] [place], [place] [address]    Report Status PENDING  Incomplete  Blood culture (routine x 2)     Status: None (Preliminary result)   Collection Time: 10/17/24 12:15 PM   Specimen: BLOOD RIGHT HAND  Result Value Ref Range Status   Specimen Description BLOOD RIGHT HAND  Final   Special Requests   Final    BOTTLES DRAWN AEROBIC ONLY Blood Culture results may not be optimal due to an inadequate volume of blood received in culture bottles   Culture   Final    NO GROWTH 2 DAYS Performed at [facility], [address] [place], [place] [address]    Report Status PENDING  Incomplete  Resp panel by RT-PCR (RSV, Flu A&B, Covid) Anterior Nasal Swab     Status: None   Collection Time: 10/17/24  1:11 PM   Specimen: Anterior Nasal Swab  Result Value Ref Range Status   SARS Coronavirus 2 by RT PCR NEGATIVE NEGATIVE Final   Influenza A by PCR NEGATIVE NEGATIVE Final   Influenza B by PCR NEGATIVE NEGATIVE Final    Comment: (NOTE) The Xpert Xpress SARS-CoV-2/FLU/RSV plus assay is intended as an aid in the diagnosis of influenza from Nasopharyngeal swab specimens and should not be used as a sole basis for treatment. Nasal washings and aspirates are unacceptable for Xpert Xpress  SARS-CoV-2/FLU/RSV testing.  Fact Sheet for Patients: bloggercourse.com  Fact Sheet for Healthcare Providers: seriousbroker.it  This test is not yet approved or cleared by the [place]  FDA and has been authorized for detection and/or diagnosis of SARS-CoV-2 by FDA under an Emergency Use Authorization (EUA). This EUA will remain in effect (meaning this test can be used) for the duration of the COVID-19 declaration under Section 564(b)(1) of the Act, 21 U.S.C. section 360bbb-3(b)(1), unless the authorization is terminated or revoked.     Resp Syncytial Virus by PCR NEGATIVE NEGATIVE Final    Comment: (NOTE) Fact Sheet for Patients: bloggercourse.com  Fact Sheet for Healthcare Providers: seriousbroker.it  This test is not yet approved or cleared by the [place]  FDA and has been authorized for detection and/or diagnosis of SARS-CoV-2 by FDA under an Emergency Use Authorization (EUA). This EUA will remain in effect (meaning this test can be used) for the duration of the COVID-19 declaration under Section 564(b)(1) of the Act, 21 U.S.C. section 360bbb-3(b)(1), unless the authorization is terminated or revoked.  Performed at [facility], [address] [place], [place] [address]      Radiology Studies: No results found.  Scheduled Meds:  aspirin   81 mg Oral Daily   levothyroxine   75 mcg Oral Q0600   memantine   5 mg Oral Daily   rosuvastatin   5 mg Oral Daily   sodium chloride  flush  3 mL Intravenous Q12H   Continuous Infusions:  ceFEPime  (MAXIPIME ) IV 2 g (10/19/24 0122)   vancomycin  Stopped (10/18/24 1338)     LOS: 2 days   Norval Bar, MD  [facility]  10/19/2024, 12:48 PM

## 2024-10-19 NOTE — Progress Notes (Signed)
 Physical Therapy Treatment Patient Details Name: Nicholas [name] MRN: [id number] DOB: 1941-01-23 Today's Date: 10/19/2024   History of Present Illness [name] adm 12/8 s/p fall with weakness. Pt's workup for SIRS (fever, tachycardia low BP) and afib with RVR . PMH HTN HLD CKDIIIa Hypothryoidism paroxysmal atrial fibrillation PAD dementia OSA Crohn's disease s/p partial colectomy chronic pancreatitis cataract, sleep apnea on CPAP, remote smoker.    PT Comments  Upon initial bout of standing, pt demonstrating bilateral knee buckling with difficulty activating quads to obtain greater knee extension. Pt then trialed standing and ambulating utilizing RW with pt demonstrating improved stability throughout with no losses of balance or signs of instability. Pt has difficulty with dual tasks, reducing gait speed throughout. Discussed stair negotiation with pt and pt's family, with pt's daughter verbalizing three family members will be there to support him if needed. PT will continue to treat pt while he is admitted. Recommending HHPT at discharge to address remaining mobility deficits and optimize return to PLOF.     If plan is discharge home, recommend the following: A little help with bathing/dressing/bathroom;A little help with walking and/or transfers;Help with stairs or ramp for entrance   Can travel by private vehicle        Equipment Recommendations  Rolling walker (2 wheels)    Recommendations for Other Services       Precautions / Restrictions Precautions Precautions: Fall Recall of Precautions/Restrictions: Intact Restrictions Weight Bearing Restrictions Per Provider Order: No     Mobility  Bed Mobility               General bed mobility comments: seated in recliner at start of session and returned to recliner    Transfers Overall transfer level: Needs assistance Equipment used: Rolling walker (2 wheels) Transfers: Sit to/from Stand Sit to Stand: Contact guard assist            General transfer comment: Pt completed STS x2 with RW, braching BLE onto recliner, and pushing up from recliner with BUE. Pt demonstrates slight posterior lean upon first attempt but was able to improve truncal flexion second attempt. Increased time to complete.    Ambulation/Gait Ambulation/Gait assistance: Contact guard assist Gait Distance (Feet): 300 Feet Assistive device: Rolling walker (2 wheels) Gait Pattern/deviations: Step-through pattern, Decreased stride length Gait velocity: reduced Gait velocity interpretation: <1.8 ft/sec, indicate of risk for recurrent falls   General Gait Details: Pt with increased reliance on RW for stability. Pt demonstrates reduced hip and knee flexion bilaterally throughout swing phase, and displays narrowed BOS throughout. Pt's gait speed reduces during dual tasks and when navigating turns. Pt requires cueing for looking ahead.   Stairs             Wheelchair Mobility     Tilt Bed    Modified Rankin (Stroke Patients Only)       Balance Overall balance assessment: Needs assistance Sitting-balance support: No upper extremity supported, Feet supported Sitting balance-Leahy Scale: Good Sitting balance - Comments: seated in recliner   Standing balance support: Bilateral upper extremity supported, During functional activity, Reliant on assistive device for balance Standing balance-Leahy Scale: Poor Standing balance comment: reliant on external support                            Communication Communication Communication: Impaired Factors Affecting Communication: Hearing impaired  Cognition Arousal: Alert Behavior During Therapy: WFL for tasks assessed/performed   PT - Cognitive impairments:  History of cognitive impairments, Memory                       PT - Cognition Comments: admits to memory issues Following commands: Intact      Cueing Cueing Techniques: Verbal cues, Visual cues  Exercises       General Comments General comments (skin integrity, edema, etc.): no signs of acute distress      Pertinent Vitals/Pain Pain Assessment Pain Assessment: No/denies pain    Home Living Family/patient expects to be discharged to:: Private residence Living Arrangements: Spouse/significant other Available Help at Discharge: Family;Available 24 hours/day Type of Home: House Home Access: Stairs to enter Entrance Stairs-Rails: Left;Right;Can reach both Entrance Stairs-Number of Steps: 8   Home Layout: Two level;Able to live on main level with bedroom/bathroom Home Equipment: None      Prior Function            PT Goals (current goals can now be found in the care plan section) Acute Rehab PT Goals Patient Stated Goal: return to independent PT Goal Formulation: With patient/family Time For Goal Achievement: 11/01/24 Potential to Achieve Goals: Good Progress towards PT goals: Progressing toward goals    Frequency    Min 2X/week      PT Plan      Co-evaluation              AM-PAC PT 6 Clicks Mobility   Outcome Measure  Help needed turning from your back to your side while in a flat bed without using bedrails?: None Help needed moving from lying on your back to sitting on the side of a flat bed without using bedrails?: None Help needed moving to and from a bed to a chair (including a wheelchair)?: A Little Help needed standing up from a chair using your arms (e.g., wheelchair or bedside chair)?: A Little Help needed to walk in hospital room?: A Little Help needed climbing 3-5 steps with a railing? : A Little 6 Click Score: 20    End of Session Equipment Utilized During Treatment: Gait belt Activity Tolerance: Patient tolerated treatment well Patient left: in chair;with call bell/phone within reach;with family/visitor present Nurse Communication: Mobility status PT Visit Diagnosis: Muscle weakness (generalized) (M62.81);Other abnormalities of gait and  mobility (R26.89)     Time: 8547-8486 PT Time Calculation (min) (ACUTE ONLY): 21 min  Charges:    $Therapeutic Activity: 8-22 mins PT General Charges $$ ACUTE PT VISIT: 1 Visit                     Leontine Hilt DPT Acute Rehab Services [phone] Prefer contact via chat    Leontine NOVAK Lenn Volker 10/19/2024, 4:07 PM

## 2024-10-19 NOTE — TOC Transition Note (Signed)
 Transition of Care [facility]) - Discharge Note   Patient Details  Name: Nicholas [name] MRN: [id number] Date of Birth: October 25, 1941  [facility] CM/SW Contact:  Sudie Erminio Deems, RN Phone Number: 10/19/2024, 3:46 PM   Clinical Narrative: ICM spoke with patient and family regarding home health. Medicare.gov list provided and family chose [facility]. Referral submitted via the hub and start of care to begin within 24-48 hours post discharge home. Patient and family did not have a preference for DME agency- clinicals submitted to the hub and [employer] has accepted. DME to be delivered to the room. Patient will transition home via private vehicle. No further needs identified at this time.   Final next level of care: Home w Home Health Services Barriers to Discharge: No Barriers Identified   Patient Goals and CMS Choice Patient states their goals for this hospitalization and ongoing recovery are:: Plan to return home once stable          Discharge Placement                       Discharge Plan and Services Additional resources added to the After Visit Summary for   In-house Referral: NA Discharge Planning Services: CM Consult Post Acute Care Choice: NA            DME Agency: NA       HH Arranged: PT, OT HH Agency: [facility] Date [facility] Agency Contacted: 10/19/24 Time HH Agency Contacted: 1538 Representative spoke with at [facility] Agency: Hub  Social Drivers of Health (SDOH) Interventions SDOH Screenings   Food Insecurity: No Food Insecurity (10/17/2024)  Housing: Low Risk  (10/17/2024)  Transportation Needs: No Transportation Needs (10/17/2024)  Utilities: Not At Risk (10/17/2024)  Social Connections: Moderately Isolated (10/17/2024)  Tobacco Use: Medium Risk (10/17/2024)     Readmission Risk Interventions     No data to display

## 2024-10-19 NOTE — Evaluation (Signed)
 Occupational Therapy Evaluation Patient Details Name: Nicholas [name] MRN: [id number] DOB: 1940/12/16 Today's Date: 10/19/2024   History of Present Illness   [name] adm 12/8 s/p fall with weakness. Pt's workup for SIRS (fever, tachycardia low BP) and afib with RVR . PMH HTN HLD CKDIIIa Hypothryoidism paroxysmal atrial fibrillation PAD dementia OSA Crohn's disease s/p partial colectomy chronic pancreatitis cataract, sleep apnea on CPAP, remote smoker.     Clinical Impressions PTA pt independent with mobility and wife assists with medication management. Pt presents with deficits with balance, endurance and cognitive deficits and would benefit from continued HHOT. Pt scored a 16 on the DGI , indicative of risk for falls. Pt with improved stability when using a RW. Educated pt/family on strategies to reduce risk of falls/issued a gait belt. CM made aware of need for RW and HHOT for safe DC home.      If plan is discharge home, recommend the following:   A little help with walking and/or transfers;A little help with bathing/dressing/bathroom;Assistance with cooking/housework;Direct supervision/assist for medications management;Direct supervision/assist for financial management;Assist for transportation;Help with stairs or ramp for entrance     Functional Status Assessment   Patient has had a recent decline in their functional status and demonstrates the ability to make significant improvements in function in a reasonable and predictable amount of time.     Equipment Recommendations   Other (comment) (RW)     Recommendations for Other Services         Precautions/Restrictions   Precautions Precautions: Fall Recall of Precautions/Restrictions: Impaired Restrictions Weight Bearing Restrictions Per Provider Order: No     Mobility Bed Mobility               General bed mobility comments: OOB in chair    Transfers Overall transfer level: Needs assistance    Transfers: Sit to/from Stand Sit to Stand: Supervision           General transfer comment: from straight back chair in the room      Balance Overall balance assessment: Needs assistance, History of Falls   Sitting balance-Leahy Scale: Good       Standing balance-Leahy Scale: Fair                   Standardized Balance Assessment Standardized Balance Assessment : Dynamic Gait Index   Dynamic Gait Index Level Surface: Mild Impairment Change in Gait Speed: Mild Impairment Gait with Horizontal Head Turns: Mild Impairment Gait with Vertical Head Turns: Mild Impairment Gait and Pivot Turn: Mild Impairment Step Over Obstacle: Mild Impairment Step Around Obstacles: Mild Impairment Steps: Mild Impairment Total Score: 16     ADL either performed or assessed with clinical judgement   ADL Overall ADL's : Needs assistance/impaired Eating/Feeding: Independent   Grooming: Supervision/safety;Standing   Upper Body Bathing: Set up;Sitting   Lower Body Bathing: Contact guard assist;Sit to/from stand   Upper Body Dressing : Set up;Sitting   Lower Body Dressing: Contact guard assist;Sit to/from stand   Toilet Transfer: Contact guard assist;Ambulation           Functional mobility during ADLs: Contact guard assist       Vision Baseline Vision/History: 1 Wears glasses Ability to See in Adequate Light: 1 Impaired Additional Comments: L eye visual changes     Perception         Praxis         Pertinent Vitals/Pain       Extremity/Trunk Assessment Upper Extremity Assessment Upper  Extremity Assessment: Generalized weakness   Lower Extremity Assessment Lower Extremity Assessment: Defer to PT evaluation   Cervical / Trunk Assessment Cervical / Trunk Assessment: Normal   Communication Communication Communication: Impaired Factors Affecting Communication: Hearing impaired   Cognition Arousal: Alert Behavior During Therapy: WFL for tasks  assessed/performed Cognition: Cognition impaired   Orientation impairments: Time Awareness: Intellectual awareness intact, Online awareness impaired Memory impairment (select all impairments): Short-term memory, Working civil service fast streamer, Conservation officer, historic buildings Attention impairment (select first level of impairment): Selective attention Executive functioning impairment (select all impairments): Reasoning                   Following commands: Intact       Cueing  General Comments   Cueing Techniques: Verbal cues      Exercises Exercises: Other exercises Other Exercises Other Exercises: encouraged chair level exercises Other Exercises: repetitive sit - stand   Shoulder Instructions      Home Living Family/patient expects to be discharged to:: Private residence Living Arrangements: Spouse/significant other Available Help at Discharge: Family;Available 24 hours/day Type of Home: House Home Access: Stairs to enter [employer] of Steps: 8 Entrance Stairs-Rails: Left;Right;Can reach both Home Layout: Two level;Able to live on main level with bedroom/bathroom     Bathroom Shower/Tub: Tub/shower unit;Curtain   Bathroom Toilet: Standard Bathroom Accessibility: Yes How Accessible: Accessible via walker Home Equipment: None          Prior Functioning/Environment Prior Level of Function : Independent/Modified Independent;Needs assist  Cognitive Assist : ADLs (cognitive)           Mobility Comments: reports recent weakness and almost fall when returning from getting mail and sped up caught himself on rail for stairs to enter home; wife reports recent weakness and slipped from bed ADLs Comments: wife assists with medication managemetn; pt recently stopped driving    OT Problem List:     OT Treatment/Interventions:        OT Goals(Current goals can be found in the care plan section)   Acute Rehab OT Goals Patient Stated Goal: home today OT Goal  Formulation: All assessment and education complete, DC therapy   OT Frequency:       Co-evaluation              AM-PAC OT 6 Clicks Daily Activity     Outcome Measure Help from another person eating meals?: None Help from another person taking care of personal grooming?: A Little Help from another person toileting, which includes using toliet, bedpan, or urinal?: A Little Help from another person bathing (including washing, rinsing, drying)?: A Little Help from another person to put on and taking off regular upper body clothing?: A Little Help from another person to put on and taking off regular lower body clothing?: A Little 6 Click Score: 19   End of Session Equipment Utilized During Treatment: Gait belt;Rolling walker (2 wheels) Nurse Communication: Mobility status;Other (comment) (DC needs)  Activity Tolerance: Patient tolerated treatment well Patient left: in chair;with call bell/phone within reach;with chair alarm set;with family/visitor present                   Time: 8487-8455 OT Time Calculation (min): 32 min Charges:  OT General Charges $OT Visit: 1 Visit OT Evaluation $OT Eval Low Complexity: 1 Low OT Treatments $Self Care/Home Management : 8-22 mins  Kreg Sink, OT/L   Acute OT Clinical Specialist Acute Rehabilitation Services Pager [phone] Office [phone]   [facility] 10/19/2024, 3:52 PM

## 2024-10-20 ENCOUNTER — Other Ambulatory Visit: Payer: Self-pay | Admitting: Cardiology

## 2024-10-20 DIAGNOSIS — I2510 Atherosclerotic heart disease of native coronary artery without angina pectoris: Secondary | ICD-10-CM

## 2024-10-22 LAB — CULTURE, BLOOD (ROUTINE X 2)
Culture: NO GROWTH
Culture: NO GROWTH
Special Requests: ADEQUATE

## 2024-11-17 ENCOUNTER — Other Ambulatory Visit: Payer: Self-pay | Admitting: Cardiology

## 2024-11-17 DIAGNOSIS — I2510 Atherosclerotic heart disease of native coronary artery without angina pectoris: Secondary | ICD-10-CM

## 2024-11-22 ENCOUNTER — Other Ambulatory Visit (HOSPITAL_COMMUNITY): Payer: Self-pay

## 2024-11-22 ENCOUNTER — Ambulatory Visit: Attending: Cardiology | Admitting: Cardiology

## 2024-11-22 ENCOUNTER — Encounter: Payer: Self-pay | Admitting: Cardiology

## 2024-11-22 VITALS — BP 153/73 | HR 91 | Ht 69.0 in | Wt 153.8 lb

## 2024-11-22 DIAGNOSIS — I480 Paroxysmal atrial fibrillation: Secondary | ICD-10-CM

## 2024-11-22 DIAGNOSIS — I10 Essential (primary) hypertension: Secondary | ICD-10-CM

## 2024-11-22 DIAGNOSIS — I25118 Atherosclerotic heart disease of native coronary artery with other forms of angina pectoris: Secondary | ICD-10-CM | POA: Diagnosis not present

## 2024-11-22 DIAGNOSIS — R0609 Other forms of dyspnea: Secondary | ICD-10-CM

## 2024-11-22 LAB — CBC
Hematocrit: 45.5 % (ref 37.5–51.0)
Hemoglobin: 14.9 g/dL (ref 13.0–17.7)
MCH: 33.5 pg — ABNORMAL HIGH (ref 26.6–33.0)
MCHC: 32.7 g/dL (ref 31.5–35.7)
MCV: 102 fL — ABNORMAL HIGH (ref 79–97)
Platelets: 185 x10E3/uL (ref 150–450)
RBC: 4.45 x10E6/uL (ref 4.14–5.80)
RDW: 13.6 % (ref 11.6–15.4)
WBC: 9.3 x10E3/uL (ref 3.4–10.8)

## 2024-11-22 LAB — BASIC METABOLIC PANEL WITH GFR
BUN/Creatinine Ratio: 9 — ABNORMAL LOW (ref 10–24)
BUN: 9 mg/dL (ref 8–27)
CO2: 23 mmol/L (ref 20–29)
Calcium: 9.2 mg/dL (ref 8.6–10.2)
Chloride: 105 mmol/L (ref 96–106)
Creatinine, Ser: 1.01 mg/dL (ref 0.76–1.27)
Glucose: 94 mg/dL (ref 70–99)
Potassium: 4.7 mmol/L (ref 3.5–5.2)
Sodium: 142 mmol/L (ref 134–144)
eGFR: 73 mL/min/1.73

## 2024-11-22 MED ORDER — AMLODIPINE BESYLATE 2.5 MG PO TABS
2.5000 mg | ORAL_TABLET | Freq: Every day | ORAL | 1 refills | Status: AC
  Filled 2024-11-22: qty 90, 90d supply, fill #0

## 2024-11-22 MED ORDER — APIXABAN 5 MG PO TABS
5.0000 mg | ORAL_TABLET | Freq: Two times a day (BID) | ORAL | 3 refills | Status: AC
  Filled 2024-11-22: qty 60, 30d supply, fill #0

## 2024-11-22 MED ORDER — METOPROLOL SUCCINATE ER 25 MG PO TB24
25.0000 mg | ORAL_TABLET | Freq: Every day | ORAL | 1 refills | Status: AC
  Filled 2024-11-22: qty 90, 90d supply, fill #0

## 2024-11-22 NOTE — H&P (View-Only) (Signed)
 "  Cardiology Office Note:  .   Date:  11/22/2024  ID:  Nicholas [name], DOB 10/17/1941, MRN [id number] PCP: Yolande Toribio MATSU, MD  [facility] Providers Cardiologist:  Gordy Bergamo, MD   History of Present Illness: Nicholas [name] is a 84 y.o. male with chronic dyspnea on exertion, OSA on CPAP (managed by neurology), hyperlipidemia, hypertension, Crohn's disease on azathioprine  s/p partial colectomy in 1980s, 1 episode of postop SVT/?atrial flutter with aberrancy in January 2014, remote history of A-fib in 2004 without recurrence.  Due to coronary CTA performed for dyspnea and fatigue revealing severe total plaque volume but coronary calcium  score in the 63rd percentile, presently on a statin and does not have any significant coronary disease.  Proximal LAD had a 50 to 69% stenosis with CT FFR of 0.82 which is not hemodynamically significant.  Had referred him back to neurology for reevaluation of his central and obstructive sleep apnea.  However he was admitted to the Hospital on 10/17/2024 with marked generalized weakness and EMS found patient in atrial fibrillation with RVR and severe hypotension due to dehydration and received IV fluids and patient spontaneously converted to sinus rhythm and patient admitted to the hospital, sepsis ruled out in view of patient being on immunosuppressive therapy for Crohn's disease, anticoagulation that was started briefly was discontinued due to dementia, history of frequent fall and very brief atrial fibrillation episode.  He now presents for cardiology follow-up.  His main complaint is continued exertional dyspnea and is very concerned about this.  States that in spite of all the efforts he has done he would like to continue to remain active, but he does not seem to get anywhere.  His wife acknowledges his symptoms.  He has not had any exertional chest pain but patient states that he stops activities as soon as he starts noting marked dyspnea and  decreased exercise tolerance.  He has not had any palpitations since hospital discharge.  Losartan  and amlodipine  were discontinued during hospital discharge due to low blood pressure and dehydration.    Discussed the use of AI scribe software for clinical note transcription with the patient, who gave verbal consent to proceed.  History of Present Illness Nicholas [name] is an 84 year old male with atrial fibrillation and coronary artery disease who presents for evaluation of anticoagulation therapy and shortness of breath.  He was recently hospitalized for dehydration and was found to be in atrial fibrillation again. He has had no falls, though he had an episode where he scooted on the floor and could not get up.  He has low energy, worse in the mornings, with some improvement later in the day. He walks to the mailbox daily but finds this tiring with shortness of breath and no chest pain. He is unsure if the shortness of breath has progressed.  He has a 50-69% proximal LAD stenosis on coronary CT angiogram. He denies chest pain but reports significant fatigue and shortness of breath with physical activity such as mowing the lawn on a ride mower.  He is not diabetic and has no stroke history. He had a prior hemorrhage but no recent gastrointestinal bleeding or ulcers. He uses Tylenol  for joint pain and rarely uses NSAIDs.  His losartan  25 mg and amlodipine  2.5 mg were stopped during his recent hospitalization due to low blood pressure. He notes some forgetfulness and depends on his caregiver to help manage medications.  Cardiac Studies relevent.    ECHOCARDIOGRAM  COMPLETE 10/02/2023  1. Left ventricular ejection fraction, by estimation, is 55 to 60%. The left ventricle has normal function. The left ventricle has no regional wall motion abnormalities. Left ventricular diastolic parameters are consistent with Grade I diastolic dysfunction (impaired relaxation). The average left ventricular global  longitudinal strain is -19.1 %. The global longitudinal strain is normal. 2. Right ventricular systolic function is normal. The right ventricular size is normal. 3. The mitral valve is normal in structure. Trivial mitral valve regurgitation. No evidence of mitral stenosis. 4. The aortic valve is tricuspid. Aortic valve regurgitation is not visualized. No aortic stenosis is present. 5. The inferior vena cava is normal in size with greater than 50% respiratory variability, suggesting right atrial pressure of 3 mmHg.   Coronary CTA 09/21/2023: 1. Coronary calcium  score of 987. This was 63rd percentile for age and sex matched control.  2. Total plaque volume 651mm3 which is 38th percentile for age and sex-matched controls (calcified plaque 265mm3; noncalcified plaque 46mm3). TPV is severe 3.  Normal coronary origin with right dominance. 4.  Moderate (50-69%) stenosis in proximal LAD.  CT FFR 0.82, not hemodynamically significant. 5.  Mild (25-49%) stenosis in proximal/mid RCA and proximal LCX.  No hemodynamically significant stenosis. 6.  Minimal (0-24%) stenosis in left main, mid LAD, and mid LCX 7.  PFO 8. No significant extracardiac findings within the visualized chest.   EKG:   EKG Interpretation Date/Time:  Tuesday November 22 2024 11:15:45 EST Ventricular Rate:  71 PR Interval:  140 QRS Duration:  74 QT Interval:  376 QTC Calculation: 408 R Axis:   15  Text Interpretation: EKG 11/22/2024: Normal sinus rhythm at rate of 71 bpm, normal EKG.  Compared to 10/17/2024, A-fib with RVR not present. Confirmed by Hilarie Sinha, Jagadeesh [phone]) on 11/22/2024 6:10:03 PM   EKG 10/17/2024: Atrial fibrillation with rapid ventricular sponsor at rate of 121 bpm.  Nonspecific T abnormality. Labs   Lab Results  Component Value Date   CHOL 60 04/19/2012   TRIG 103 04/19/2012   No results found for: LIPOA  Recent Labs    10/17/24 0945 10/17/24 1026 10/18/24 0337 10/19/24 0400  NA 136 139 137 137   K 3.8 3.9 3.8 4.2  CL 104 105 108 106  CO2 22  --  19* 26  GLUCOSE 131* 132* 110* 97  BUN 13 15 14 14   CREATININE 1.19 1.10 1.08 1.31*  CALCIUM  7.8*  --  8.0* 8.0*  GFRNONAA 60*  --  >60 53*    Lab Results  Component Value Date   ALT 42 10/19/2024   AST 90 (H) 10/19/2024   ALKPHOS 74 10/19/2024   BILITOT 1.0 10/19/2024      Latest Ref Rng & Units 10/19/2024    4:00 AM 10/18/2024    3:37 AM 10/17/2024   10:26 AM  CBC  WBC 4.0 - 10.5 K/uL 5.0  7.9    Hemoglobin 13.0 - 17.0 g/dL 87.1  86.1  84.6   Hematocrit 39.0 - 52.0 % 37.9  40.3  45.0   Platelets 150 - 400 K/uL 109  95     No results found for: HGBA1C  Lab Results  Component Value Date   TSH 3.483 10/18/2024    ROS  Review of Systems  Cardiovascular:  Positive for dyspnea on exertion. Negative for chest pain and leg swelling.   Physical Exam:   VS:  BP (!) 153/73 (BP Location: Left Arm, Patient Position: Sitting, Cuff Size: Normal)   Pulse  91   Ht 5' 9 (1.753 m)   Wt 153 lb 12.8 oz (69.8 kg)   SpO2 97%   BMI 22.71 kg/m    Wt Readings from Last 3 Encounters:  11/22/24 153 lb 12.8 oz (69.8 kg)  10/17/24 158 lb (71.7 kg)  07/26/24 151 lb (68.5 kg)    BP Readings from Last 3 Encounters:  11/22/24 (!) 153/73  10/19/24 (!) 148/82  09/08/24 (!) 123/52   Physical Exam Neck:     Vascular: No carotid bruit or JVD.  Cardiovascular:     Rate and Rhythm: Normal rate and regular rhythm.     Pulses: Intact distal pulses.     Heart sounds: Normal heart sounds. No murmur heard.    No gallop.  Pulmonary:     Effort: Pulmonary effort is normal.     Breath sounds: Normal breath sounds.  Abdominal:     General: Bowel sounds are normal.     Palpations: Abdomen is soft.  Musculoskeletal:     Right lower leg: No edema.     Left lower leg: No edema.     ASSESSMENT AND PLAN: .      ICD-10-CM   1. PAF (paroxysmal atrial fibrillation) (HCC)  I48.0 apixaban  (ELIQUIS ) 5 MG TABS tablet    EKG 12-Lead    CBC     Basic metabolic panel with GFR    2. Coronary artery disease involving native coronary artery of native heart with other form of angina pectoris  I25.118 Full code    metoprolol  succinate (TOPROL -XL) 25 MG 24 hr tablet    EKG 12-Lead    CBC    Basic metabolic panel with GFR    3. DOE (dyspnea on exertion)  R06.09 EKG 12-Lead    CBC    Basic metabolic panel with GFR    4. Primary hypertension  I10 amLODipine  (NORVASC ) 2.5 MG tablet    metoprolol  succinate (TOPROL -XL) 25 MG 24 hr tablet    EKG 12-Lead    CBC    Basic metabolic panel with GFR    Click Here to Calculate/Change CHADS2VASc Score The patient's CHADS2-VASc score is 4, indicating a 4.8% annual risk of stroke.  Therefore, anticoagulation is recommended.   CHF History: No HTN History: Yes Diabetes History: No Stroke History: No Vascular Disease History: Yes  Assessment & Plan Paroxysmal atrial fibrillation Recent episode during hospitalization for dehydration. High risk of stroke due to age and hypertension. Discussed anticoagulation therapy to prevent stroke, despite bleeding risk, especially given dementia and potential falls. Emphasized unpredictability of AFib recurrence and importance of stroke prevention. - Prescribed Eliquis  5 mg p.o. twice daily for anticoagulation, hold Eliquis  for now and will start postcardiac catheterization. - Instructed to continue baby aspirin  until heart catheterization is scheduled.  Atherosclerotic heart disease of native coronary artery with angina Proximal LAD blockage (50-69%) with FFR of 0.82, close to significant. Reports fatigue and shortness of breath, possibly anginal equivalent. Discussed potential benefit of heart catheterization to assess and possibly treat blockage. Explained risks: less than 1% risk of death, stroke, heart attack, urgent bypass surgery, bleeding, and infection. He agreed to proceed with catheterization to improve symptoms and quality of life. - Scheduled left  heart catheterization to assess and potentially treat coronary artery blockage. - Discussed potential for CPR and life support during procedure if cardiac arrest occurs. - Depending upon whether he needs PCI or not, will dictate his DAPT and anticoagulation strategy. - Cardiac catheterization scheduled ASAP with Dr. Maude  Jordan. - Although 84 years of age, patient is very active, very minimal dementia of not much clinical consequence right now, he is still independent and makes appropriate decisions. - Wife is present at the bedside.  Primary hypertension Blood pressure management complicated by recent low readings leading to medication adjustments. Previously on losartan  and amlodipine . Current blood pressure elevated, indicating need to restart antihypertensive therapy. - Restarted losartan  and amlodipine  for blood pressure management.   Follow up: Postcardiac catheterization  Signed,  Gordy Bergamo, MD, [facility] 11/22/2024, 6:10 PM [facility] [address] [place], [place] [address] Phone: [phone]. Fax:  [phone]  "

## 2024-11-22 NOTE — Patient Instructions (Addendum)
 Medication Instructions:  START *If you need a refill on your cardiac medications before your next appointment, please call your pharmacy*  amLODipine  (NORVASC ) 2.5 MG tablet         Take 1 tablet (2.5 mg total) by mouth daily., Starting Tue 11/22/2024, Until Mon 02/20/2025, Normal     apixaban  (ELIQUIS ) 5 MG TABS tablet  (NOT STARTED YET)        Take 1 tablet (5 mg total) by mouth 2 (two) times daily.     metoprolol  succinate (TOPROL -XL) 25 MG 24 hr tablet        Take 1 tablet (25 mg total) by mouth daily. Take with or immediately following a meal    Lab Work: (TODAY) Lab Orders         CBC         Basic metabolic panel with GFR     If you have labs (blood work) drawn today and your tests are completely normal, you will receive your results only by: MyChart Message (if you have MyChart) OR A paper copy in the mail If you have any lab test that is abnormal or we need to change your treatment, we will call you to review the results.  Testing/Procedures: CARDIAC CATH  Your physician has requested that you have a cardiac catheterization. Cardiac catheterization is used to diagnose and/or treat various heart conditions. Doctors may recommend this procedure for a number of different reasons. The most common reason is to evaluate chest pain. Chest pain can be a symptom of coronary artery disease (CAD), and cardiac catheterization can show whether plaque is narrowing or blocking your hearts arteries. This procedure is also used to evaluate the valves, as well as measure the blood flow and oxygen  levels in different parts of your heart. For further information please visit [email or web address] Please follow instruction sheet, as given.   Follow-Up: At [facility], you and your health needs are our priority.  As part of our continuing mission to provide you with exceptional heart care, our providers are all part of one team.  This team includes your primary Cardiologist (physician) and  Advanced Practice Providers or APPs (Physician Assistants and Nurse Practitioners) who all work together to provide you with the care you need, when you need it.  Your next appointment:   1 month(s)  Provider:   Any APP  We recommend signing up for the patient portal called MyChart.  Sign up information is provided on this After Visit Summary.  MyChart is used to connect with patients for Virtual Visits (Telemedicine).  Patients are able to view lab/test results, encounter notes, upcoming appointments, etc.  Non-urgent messages can be sent to your provider as well.   To learn more about what you can do with MyChart, go to [email or web address].   Other Instructions  [facility] A DEPT OF [place] [address] A DEPT OF THE [place]. [facility] [address] [place] [address] Dept: [phone] Loc: [phone]  CHANZ CAHALL  11/22/2024  You are scheduled for a Cardiac Catheterization on Thursday, January 15 with Dr. Peter Jordan.  1. Please arrive at the [facility] (Main Entrance A) at [facility]: [address] [place], [place] [address] at 7am (This time is 2 hour(s) before your procedure to ensure your preparation).   Free valet parking service is available. You will check in at ADMITTING. The support person will be asked to wait in  the waiting room.  It is OK to have someone drop you off and come back when you are ready to be discharged.    Special note: Every effort is made to have your procedure done on time. Please understand that emergencies sometimes delay scheduled procedures.  2. Diet: Nothing to eat after midnight.   3. Hydration: You need to be well hydrated before your procedure. On January 15 you may drink approved liquids (see below) until 2 hours before the procedure, with 16 oz of water  as your last intake.   List of approved liquids water , clear juice, clear tea, black coffee, fruit juices,  non-citric and without pulp, carbonated beverages, Gatorade, Kool -Aid, plain Jello-O and plain ice popsicles.  4. Labs: TODAY  5. Medication instructions in preparation for your procedure:   Contrast Allergy: No   Current Outpatient Medications (Endocrine & Metabolic):    levothyroxine  (SYNTHROID ) 75 MCG tablet, Take 75 mcg by mouth daily before breakfast.   testosterone  cypionate (DEPOTESTOSTERONE CYPIONATE) 200 MG/ML injection, Inject 100 mg into the muscle every 21 ( twenty-one) days.  Current Outpatient Medications (Cardiovascular):    amLODipine  (NORVASC ) 2.5 MG tablet, Take 1 tablet (2.5 mg total) by mouth daily.   metoprolol  succinate (TOPROL -XL) 25 MG 24 hr tablet, Take 1 tablet (25 mg total) by mouth daily. Take with or immediately following a meal.   rosuvastatin  (CRESTOR ) 5 MG tablet, Take 1 tablet (5 mg total) by mouth daily.  Current Outpatient Medications (Analgesics):    aspirin  (ASPIRIN  CHILDRENS) 81 MG chewable tablet, Chew 1 tablet (81 mg total) by mouth daily.  Current Outpatient Medications (Hematological):    apixaban  (ELIQUIS ) 5 MG TABS tablet, Take 1 tablet (5 mg total) by mouth 2 (two) times daily.   ferrous sulfate 325 (65 FE) MG tablet, Take 325 mg by mouth daily.  Current Outpatient Medications (Other):    azaTHIOprine  (IMURAN ) 50 MG tablet, Take 50 mg by mouth daily.   ergocalciferol (VITAMIN D2) 50000 UNITS capsule, Take 50,000 Units by mouth once a week. Monday's.   loperamide (IMODIUM A-D) 2 MG tablet, Take 2 mg by mouth 4 (four) times daily as needed for diarrhea or loose stools.   meclizine (ANTIVERT) 12.5 MG tablet, Take 12.5 mg by mouth every 8 (eight) hours as needed for dizziness or nausea.   memantine  (NAMENDA ) 5 MG tablet, Take 5 mg by mouth 2 (two) times daily.   Multiple Vitamin (MULTI VITAMIN DAILY PO), Take 1 tablet by mouth daily.   Multiple Vitamins-Minerals (PRESERVISION AREDS 2) CAPS, Take 1 capsule by mouth 2 (two) times daily. *For  reference purposes while preparing patient instructions.   Delete this med list prior to printing instructions for patient.*  On the morning of your procedure, take your Aspirin  81 mg and any morning medicines NOT listed above.  You may use sips of water .  ** ELIQUIS  TO BE STARTED POST CATH**  6. Plan to go home the same day, you will only stay overnight if medically necessary. 7. Bring a current list of your medications and current insurance cards. 8. You MUST have a responsible person to drive you home. 9. Someone MUST be with you the first 24 hours after you arrive home or your discharge will be delayed. 10. Please wear clothes that are easy to get on and off and wear slip-on shoes.  Thank you for allowing us  to care for you!   -- [facility]

## 2024-11-22 NOTE — Progress Notes (Addendum)
 "  Cardiology Office Note:  .   Date:  11/22/2024  ID:  Nicholas [name], DOB 1941/10/17, MRN [id number] PCP: Yolande Toribio MATSU, MD  [facility] Providers Cardiologist:  Gordy Bergamo, MD   History of Present Illness: Nicholas [name] is a 84 y.o. male with chronic dyspnea on exertion, OSA on CPAP (managed by neurology), hyperlipidemia, hypertension, Crohn's disease on azathioprine  s/p partial colectomy in 1980s, 1 episode of postop SVT/?atrial flutter with aberrancy in January 2014, remote history of A-fib in 2004 without recurrence.  Due to coronary CTA performed for dyspnea and fatigue revealing severe total plaque volume but coronary calcium  score in the 63rd percentile, presently on a statin and does not have any significant coronary disease.  Proximal LAD had a 50 to 69% stenosis with CT FFR of 0.82 which is not hemodynamically significant.  Had referred him back to neurology for reevaluation of his central and obstructive sleep apnea.  However he was admitted to the Hospital on 10/17/2024 with marked generalized weakness and EMS found patient in atrial fibrillation with RVR and severe hypotension due to dehydration and received IV fluids and patient spontaneously converted to sinus rhythm and patient admitted to the hospital, sepsis ruled out in view of patient being on immunosuppressive therapy for Crohn's disease, anticoagulation that was started briefly was discontinued due to dementia, history of frequent fall and very brief atrial fibrillation episode.  He now presents for cardiology follow-up.  His main complaint is continued exertional dyspnea and is very concerned about this.  States that in spite of all the efforts he has done he would like to continue to remain active, but he does not seem to get anywhere.  His wife acknowledges his symptoms.  He has not had any exertional chest pain but patient states that he stops activities as soon as he starts noting marked dyspnea and  decreased exercise tolerance.  He has not had any palpitations since hospital discharge.  Losartan  and amlodipine  were discontinued during hospital discharge due to low blood pressure and dehydration.    Discussed the use of AI scribe software for clinical note transcription with the patient, who gave verbal consent to proceed.  History of Present Illness Nicholas [name] is an 84 year old male with atrial fibrillation and coronary artery disease who presents for evaluation of anticoagulation therapy and shortness of breath.  He was recently hospitalized for dehydration and was found to be in atrial fibrillation again. He has had no falls, though he had an episode where he scooted on the floor and could not get up.  He has low energy, worse in the mornings, with some improvement later in the day. He walks to the mailbox daily but finds this tiring with shortness of breath and no chest pain. He is unsure if the shortness of breath has progressed.  He has a 50-69% proximal LAD stenosis on coronary CT angiogram. He denies chest pain but reports significant fatigue and shortness of breath with physical activity such as mowing the lawn on a ride mower.  He is not diabetic and has no stroke history. He had a prior hemorrhage but no recent gastrointestinal bleeding or ulcers. He uses Tylenol  for joint pain and rarely uses NSAIDs.  His losartan  25 mg and amlodipine  2.5 mg were stopped during his recent hospitalization due to low blood pressure. He notes some forgetfulness and depends on his caregiver to help manage medications.  Cardiac Studies relevent.    ECHOCARDIOGRAM  COMPLETE 10/02/2023  1. Left ventricular ejection fraction, by estimation, is 55 to 60%. The left ventricle has normal function. The left ventricle has no regional wall motion abnormalities. Left ventricular diastolic parameters are consistent with Grade I diastolic dysfunction (impaired relaxation). The average left ventricular global  longitudinal strain is -19.1 %. The global longitudinal strain is normal. 2. Right ventricular systolic function is normal. The right ventricular size is normal. 3. The mitral valve is normal in structure. Trivial mitral valve regurgitation. No evidence of mitral stenosis. 4. The aortic valve is tricuspid. Aortic valve regurgitation is not visualized. No aortic stenosis is present. 5. The inferior vena cava is normal in size with greater than 50% respiratory variability, suggesting right atrial pressure of 3 mmHg.   Coronary CTA 09/21/2023: 1. Coronary calcium  score of 987. This was 63rd percentile for age and sex matched control.  2. Total plaque volume 651mm3 which is 38th percentile for age and sex-matched controls (calcified plaque 265mm3; noncalcified plaque 46mm3). TPV is severe 3.  Normal coronary origin with right dominance. 4.  Moderate (50-69%) stenosis in proximal LAD.  CT FFR 0.82, not hemodynamically significant. 5.  Mild (25-49%) stenosis in proximal/mid RCA and proximal LCX.  No hemodynamically significant stenosis. 6.  Minimal (0-24%) stenosis in left main, mid LAD, and mid LCX 7.  PFO 8. No significant extracardiac findings within the visualized chest.   EKG:   EKG Interpretation Date/Time:  Tuesday November 22 2024 11:15:45 EST Ventricular Rate:  71 PR Interval:  140 QRS Duration:  74 QT Interval:  376 QTC Calculation: 408 R Axis:   15  Text Interpretation: EKG 11/22/2024: Normal sinus rhythm at rate of 71 bpm, normal EKG.  Compared to 10/17/2024, A-fib with RVR not present. Confirmed by Hilarie Sinha, Jagadeesh [phone]) on 11/22/2024 6:10:03 PM   EKG 10/17/2024: Atrial fibrillation with rapid ventricular sponsor at rate of 121 bpm.  Nonspecific T abnormality. Labs   Lab Results  Component Value Date   CHOL 60 04/19/2012   TRIG 103 04/19/2012   No results found for: LIPOA  Recent Labs    10/17/24 0945 10/17/24 1026 10/18/24 0337 10/19/24 0400  NA 136 139 137 137   K 3.8 3.9 3.8 4.2  CL 104 105 108 106  CO2 22  --  19* 26  GLUCOSE 131* 132* 110* 97  BUN 13 15 14 14   CREATININE 1.19 1.10 1.08 1.31*  CALCIUM  7.8*  --  8.0* 8.0*  GFRNONAA 60*  --  >60 53*    Lab Results  Component Value Date   ALT 42 10/19/2024   AST 90 (H) 10/19/2024   ALKPHOS 74 10/19/2024   BILITOT 1.0 10/19/2024      Latest Ref Rng & Units 10/19/2024    4:00 AM 10/18/2024    3:37 AM 10/17/2024   10:26 AM  CBC  WBC 4.0 - 10.5 K/uL 5.0  7.9    Hemoglobin 13.0 - 17.0 g/dL 87.1  86.1  84.6   Hematocrit 39.0 - 52.0 % 37.9  40.3  45.0   Platelets 150 - 400 K/uL 109  95     No results found for: HGBA1C  Lab Results  Component Value Date   TSH 3.483 10/18/2024    ROS  Review of Systems  Cardiovascular:  Positive for dyspnea on exertion. Negative for chest pain and leg swelling.   Physical Exam:   VS:  BP (!) 153/73 (BP Location: Left Arm, Patient Position: Sitting, Cuff Size: Normal)   Pulse  91   Ht 5' 9 (1.753 m)   Wt 153 lb 12.8 oz (69.8 kg)   SpO2 97%   BMI 22.71 kg/m    Wt Readings from Last 3 Encounters:  11/22/24 153 lb 12.8 oz (69.8 kg)  10/17/24 158 lb (71.7 kg)  07/26/24 151 lb (68.5 kg)    BP Readings from Last 3 Encounters:  11/22/24 (!) 153/73  10/19/24 (!) 148/82  09/08/24 (!) 123/52   Physical Exam Neck:     Vascular: No carotid bruit or JVD.  Cardiovascular:     Rate and Rhythm: Normal rate and regular rhythm.     Pulses: Intact distal pulses.     Heart sounds: Normal heart sounds. No murmur heard.    No gallop.  Pulmonary:     Effort: Pulmonary effort is normal.     Breath sounds: Normal breath sounds.  Abdominal:     General: Bowel sounds are normal.     Palpations: Abdomen is soft.  Musculoskeletal:     Right lower leg: No edema.     Left lower leg: No edema.     ASSESSMENT AND PLAN: .      ICD-10-CM   1. PAF (paroxysmal atrial fibrillation) (HCC)  I48.0 apixaban  (ELIQUIS ) 5 MG TABS tablet    EKG 12-Lead    CBC     Basic metabolic panel with GFR    2. Coronary artery disease involving native coronary artery of native heart with other form of angina pectoris  I25.118 Full code    metoprolol  succinate (TOPROL -XL) 25 MG 24 hr tablet    EKG 12-Lead    CBC    Basic metabolic panel with GFR    3. DOE (dyspnea on exertion)  R06.09 EKG 12-Lead    CBC    Basic metabolic panel with GFR    4. Primary hypertension  I10 amLODipine  (NORVASC ) 2.5 MG tablet    metoprolol  succinate (TOPROL -XL) 25 MG 24 hr tablet    EKG 12-Lead    CBC    Basic metabolic panel with GFR    Click Here to Calculate/Change CHADS2VASc Score The patient's CHADS2-VASc score is 4, indicating a 4.8% annual risk of stroke.  Therefore, anticoagulation is recommended.   CHF History: No HTN History: Yes Diabetes History: No Stroke History: No Vascular Disease History: Yes  Assessment & Plan Paroxysmal atrial fibrillation Recent episode during hospitalization for dehydration. High risk of stroke due to age and hypertension. Discussed anticoagulation therapy to prevent stroke, despite bleeding risk, especially given dementia and potential falls. Emphasized unpredictability of AFib recurrence and importance of stroke prevention. - Prescribed Eliquis  5 mg p.o. twice daily for anticoagulation, hold Eliquis  for now and will start postcardiac catheterization. - Instructed to continue baby aspirin  until heart catheterization is scheduled.  Atherosclerotic heart disease of native coronary artery with angina Proximal LAD blockage (50-69%) with FFR of 0.82, close to significant. Reports fatigue and shortness of breath, possibly anginal equivalent. Discussed potential benefit of heart catheterization to assess and possibly treat blockage. Explained risks: less than 1% risk of death, stroke, heart attack, urgent bypass surgery, bleeding, and infection. He agreed to proceed with catheterization to improve symptoms and quality of life. - Scheduled left  heart catheterization to assess and potentially treat coronary artery blockage. - Discussed potential for CPR and life support during procedure if cardiac arrest occurs. - Depending upon whether he needs PCI or not, will dictate his DAPT and anticoagulation strategy. - Cardiac catheterization scheduled ASAP with Dr. Maude  Jordan. - Although 84 years of age, patient is very active, very minimal dementia of not much clinical consequence right now, he is still independent and makes appropriate decisions. - Wife is present at the bedside.  Primary hypertension Blood pressure management complicated by recent low readings leading to medication adjustments. Previously on losartan  and amlodipine . Current blood pressure elevated, indicating need to restart antihypertensive therapy. - Restarted losartan  and amlodipine  for blood pressure management.   Follow up: Postcardiac catheterization  Signed,  Gordy Bergamo, MD, [facility] 11/22/2024, 6:10 PM [facility] [address] [place], [place] [address] Phone: [phone]. Fax:  [phone]  "

## 2024-11-23 ENCOUNTER — Ambulatory Visit: Payer: Self-pay | Admitting: Cardiology

## 2024-11-24 ENCOUNTER — Encounter (HOSPITAL_COMMUNITY): Payer: Self-pay | Admitting: Cardiology

## 2024-11-24 ENCOUNTER — Ambulatory Visit (HOSPITAL_COMMUNITY)
Admission: RE | Admit: 2024-11-24 | Discharge: 2024-11-24 | Disposition: A | Discharge status: Home / Self Care | Attending: Cardiology | Admitting: Cardiology

## 2024-11-24 ENCOUNTER — Encounter (HOSPITAL_COMMUNITY): Admission: RE | Disposition: A | Payer: Self-pay | Source: Home / Self Care | Attending: Cardiology

## 2024-11-24 ENCOUNTER — Other Ambulatory Visit: Payer: Self-pay

## 2024-11-24 DIAGNOSIS — I480 Paroxysmal atrial fibrillation: Secondary | ICD-10-CM | POA: Diagnosis not present

## 2024-11-24 DIAGNOSIS — Z7901 Long term (current) use of anticoagulants: Secondary | ICD-10-CM | POA: Diagnosis not present

## 2024-11-24 DIAGNOSIS — I10 Essential (primary) hypertension: Secondary | ICD-10-CM | POA: Insufficient documentation

## 2024-11-24 DIAGNOSIS — I25118 Atherosclerotic heart disease of native coronary artery with other forms of angina pectoris: Secondary | ICD-10-CM

## 2024-11-24 DIAGNOSIS — R0609 Other forms of dyspnea: Secondary | ICD-10-CM | POA: Diagnosis present

## 2024-11-24 DIAGNOSIS — Z79899 Other long term (current) drug therapy: Secondary | ICD-10-CM | POA: Diagnosis not present

## 2024-11-24 HISTORY — PX: LEFT HEART CATH AND CORONARY ANGIOGRAPHY: CATH118249

## 2024-11-24 MED ORDER — FREE WATER: 500.0000 mL | Freq: Once | Status: DC

## 2024-11-24 MED ORDER — ASPIRIN 81 MG PO CHEW: 81.0000 mg | CHEWABLE_TABLET | ORAL | Status: DC

## 2024-11-24 MED ORDER — SODIUM CHLORIDE 0.9 % IV SOLN: 250.0000 mL | INTRAVENOUS | Status: DC | PRN

## 2024-11-24 MED ORDER — SODIUM CHLORIDE 0.9% FLUSH: 3.0000 mL | INTRAVENOUS | Status: DC | PRN

## 2024-11-24 MED ORDER — HEPARIN (PORCINE) IN NACL 1000-0.9 UT/500ML-% IV SOLN
INTRAVENOUS | Status: DC | PRN
  Administered 2024-11-24: 500 mL via SURGICAL_CAVITY

## 2024-11-24 MED ORDER — VERAPAMIL HCL 2.5 MG/ML IV SOLN
INTRAVENOUS | Status: AC
  Filled 2024-11-24: qty 2

## 2024-11-24 MED ORDER — HEPARIN SODIUM (PORCINE) 1000 UNIT/ML IJ SOLN
INTRAMUSCULAR | Status: AC
  Filled 2024-11-24: qty 10

## 2024-11-24 MED ORDER — SODIUM CHLORIDE 0.9% FLUSH: 3.0000 mL | Freq: Two times a day (BID) | INTRAVENOUS | Status: DC

## 2024-11-24 MED ORDER — MIDAZOLAM HCL (PF) 2 MG/2ML IJ SOLN
INTRAMUSCULAR | Status: DC | PRN
  Administered 2024-11-24: 1 mg via INTRAVENOUS

## 2024-11-24 MED ORDER — LABETALOL HCL 5 MG/ML IV SOLN: 10.0000 mg | INTRAVENOUS | Status: DC | PRN

## 2024-11-24 MED ORDER — ONDANSETRON HCL 4 MG/2ML IJ SOLN: 4.0000 mg | Freq: Four times a day (QID) | INTRAMUSCULAR | Status: DC | PRN

## 2024-11-24 MED ORDER — FENTANYL CITRATE (PF) 100 MCG/2ML IJ SOLN
INTRAMUSCULAR | Status: AC
  Filled 2024-11-24: qty 2

## 2024-11-24 MED ORDER — HYDRALAZINE HCL 20 MG/ML IJ SOLN: 10.0000 mg | INTRAMUSCULAR | Status: DC | PRN

## 2024-11-24 MED ORDER — FENTANYL CITRATE (PF) 100 MCG/2ML IJ SOLN
INTRAMUSCULAR | Status: DC | PRN
  Administered 2024-11-24: 25 ug via INTRAVENOUS

## 2024-11-24 MED ORDER — MIDAZOLAM HCL 2 MG/2ML IJ SOLN
INTRAMUSCULAR | Status: AC
  Filled 2024-11-24: qty 2

## 2024-11-24 MED ORDER — VERAPAMIL HCL 2.5 MG/ML IV SOLN
INTRAVENOUS | Status: DC | PRN
  Administered 2024-11-24: 10 mL via INTRA_ARTERIAL

## 2024-11-24 MED ORDER — LIDOCAINE HCL (PF) 1 % IJ SOLN
INTRAMUSCULAR | Status: AC
  Filled 2024-11-24: qty 30

## 2024-11-24 MED ORDER — LIDOCAINE HCL (PF) 1 % IJ SOLN
INTRAMUSCULAR | Status: DC | PRN
  Administered 2024-11-24: 2 mL

## 2024-11-24 MED ORDER — IOHEXOL 350 MG/ML SOLN
INTRAVENOUS | Status: DC | PRN
  Administered 2024-11-24: 55 mL via INTRA_ARTERIAL

## 2024-11-24 MED ORDER — HEPARIN SODIUM (PORCINE) 1000 UNIT/ML IJ SOLN
INTRAMUSCULAR | Status: DC | PRN
  Administered 2024-11-24: 3500 [IU] via INTRAVENOUS

## 2024-11-24 MED ORDER — ACETAMINOPHEN 325 MG PO TABS: 650.0000 mg | ORAL_TABLET | ORAL | Status: DC | PRN

## 2024-11-24 NOTE — Discharge Instructions (Addendum)
 Ok to start Eliquis  5 mg bid   Drink plenty of fluids for 48 hours and keep wrist elevated at heart level for 24 hours  Radial Site Care   This sheet gives you information about how to care for yourself after your procedure. Your health care provider may also give you more specific instructions. If you have problems or questions, contact your health care provider. What can I expect after the procedure? After the procedure, it is common to have: Bruising and tenderness at the catheter insertion area. Follow these instructions at home: Medicines Take over-the-counter and prescription medicines only as told by your health care provider. Insertion site care Follow instructions from your health care provider about how to take care of your insertion site. Make sure you: Wash your hands with soap and water  before you change your bandage (dressing). If soap and water  are not available, use hand sanitizer. Remove your dressing as told by your health care provider. In 24 hours Check your insertion site every day for signs of infection. Check for: Redness, swelling, or pain. Fluid or blood. Pus or a bad smell. Warmth. Do not take baths, swim, or use a hot tub until your health care provider approves. You may shower 24-48 hours after the procedure, or as directed by your health care provider. Remove the dressing and gently wash the site with plain soap and water . Pat the area dry with a clean towel. Do not rub the site. That could cause bleeding. Do not apply powder or lotion to the site. Activity   For 24 hours after the procedure, or as directed by your health care provider: Do not flex or bend the affected arm. Do not push or pull heavy objects with the affected arm. Do not drive yourself home from the hospital or clinic. You may drive 24 hours after the procedure unless your health care provider tells you not to. Do not operate machinery or power tools. Do not lift anything that is  heavier than 10 lb (4.5 kg), or the limit that you are told, until your health care provider says that it is safe.  For 4 days Ask your health care provider when it is okay to: Return to work or school. Resume usual physical activities or sports. Resume sexual activity. General instructions If the catheter site starts to bleed, raise your arm and put firm pressure on the site. If the bleeding does not stop, get help right away. This is a medical emergency. If you went home on the same day as your procedure, a responsible adult should be with you for the first 24 hours after you arrive home. Keep all follow-up visits as told by your health care provider. This is important. Contact a health care provider if: You have a fever. You have redness, swelling, or yellow drainage around your insertion site. Get help right away if: You have unusual pain at the radial site. The catheter insertion area swells very fast. The insertion area is bleeding, and the bleeding does not stop when you hold steady pressure on the area. Your arm or hand becomes pale, cool, tingly, or numb. These symptoms may represent a serious problem that is an emergency. Do not wait to see if the symptoms will go away. Get medical help right away. Call your local emergency services (911 in the [place] Do not drive yourself to the hospital. Summary After the procedure, it is common to have bruising and tenderness at the site. Follow instructions from your health  care provider about how to take care of your radial site wound. Check the wound every day for signs of infection. Do not lift anything that is heavier than 10 lb (4.5 kg), or the limit that you are told, until your health care provider says that it is safe. This information is not intended to replace advice given to you by your health care provider. Make sure you discuss any questions you have with your health care provider. Document Revised: 12/02/2017 Document Reviewed:  12/02/2017 Elsevier Patient Education  2020 [employer].

## 2024-11-24 NOTE — Progress Notes (Signed)
 Pt and wife received discharge instructions, teach back performed. IV removed, no complications. Rt radial site is clean dry intact, site is soft, no signs of bleeding. Pt escorted out via wheelchair to wife's vehicle.

## 2024-11-24 NOTE — Interval H&P Note (Signed)
 History and Physical Interval Note:  11/24/2024 9:30 AM  Nicholas [name]  has presented today for surgery, with the diagnosis of shortness of breath - angina.  The various methods of treatment have been discussed with the patient and family. After consideration of risks, benefits and other options for treatment, the patient has consented to  Procedures: LEFT HEART CATH AND CORONARY ANGIOGRAPHY (N/A) as a surgical intervention.  The patient's history has been reviewed, patient examined, no change in status, stable for surgery.  I have reviewed the patient's chart and labs.  Questions were answered to the patient's satisfaction.   Cath Lab Visit (complete for each Cath Lab visit)  Clinical Evaluation Leading to the Procedure:   ACS: No.  Non-ACS:    Anginal Classification: CCS III  Anti-ischemic medical therapy: Maximal Therapy (2 or more classes of medications)  Non-Invasive Test Results: No non-invasive testing performed  Prior CABG: No previous CABG        Nicholas [name] 11/24/2024 9:31 AM

## 2024-12-01 ENCOUNTER — Telehealth: Payer: Self-pay | Admitting: Cardiology

## 2024-12-01 NOTE — Telephone Encounter (Signed)
 Patient had a procedure on 11/22/24 and would like to know if he should take Eliquis  along Aspirin . Please advise

## 2024-12-01 NOTE — Telephone Encounter (Signed)
 He can stop aspirin  that was started in preparation for possible need for coronary angioplasty, fortunately he did not have significant blockages

## 2024-12-01 NOTE — Telephone Encounter (Signed)
 Called patient back to let him know that he does not need to take Aspirin  per Dr. Ladona. Patient verbalized understanding.

## 2024-12-08 ENCOUNTER — Ambulatory Visit: Admitting: Podiatry

## 2024-12-12 ENCOUNTER — Other Ambulatory Visit: Payer: Self-pay | Admitting: Cardiology

## 2024-12-12 DIAGNOSIS — I2510 Atherosclerotic heart disease of native coronary artery without angina pectoris: Secondary | ICD-10-CM

## 2024-12-15 NOTE — Telephone Encounter (Signed)
 LVM with pt to cb to confirm who is monitoring cholesterol before sending refill.

## 2024-12-15 NOTE — Telephone Encounter (Signed)
 In accordance with refill protocols, please review and address the following requirements before this medication refill can be authorized:  Labs   Pt needs a lipid panel within 12 months, this has not been done

## 2024-12-30 ENCOUNTER — Ambulatory Visit: Admitting: Podiatry

## 2025-01-02 ENCOUNTER — Ambulatory Visit: Admitting: Physician Assistant

## 2025-07-26 ENCOUNTER — Ambulatory Visit: Admitting: Adult Health
